# Patient Record
Sex: Female | Born: 1935
Health system: Southern US, Community
[De-identification: ages and names within clinical notes are randomized; demographics above are authoritative.]

## PROBLEM LIST (undated history)

## (undated) DIAGNOSIS — I10 Essential (primary) hypertension: Secondary | ICD-10-CM

## (undated) DIAGNOSIS — F0390 Unspecified dementia without behavioral disturbance: Secondary | ICD-10-CM

## (undated) DIAGNOSIS — D649 Anemia, unspecified: Secondary | ICD-10-CM

## (undated) HISTORY — DX: Essential (primary) hypertension: I10

## (undated) HISTORY — DX: Anemia, unspecified: D64.9

---

## 1975-04-30 HISTORY — PX: TUBAL LIGATION: SHX77

## 2000-12-17 ENCOUNTER — Encounter: Payer: Self-pay | Admitting: Internal Medicine

## 2000-12-17 ENCOUNTER — Ambulatory Visit (HOSPITAL_COMMUNITY): Admission: RE | Admit: 2000-12-17 | Discharge: 2000-12-17 | Payer: Self-pay | Admitting: Internal Medicine

## 2001-12-23 ENCOUNTER — Encounter: Payer: Self-pay | Admitting: Internal Medicine

## 2001-12-23 ENCOUNTER — Ambulatory Visit (HOSPITAL_COMMUNITY): Admission: RE | Admit: 2001-12-23 | Discharge: 2001-12-23 | Payer: Self-pay | Admitting: Internal Medicine

## 2002-01-11 ENCOUNTER — Other Ambulatory Visit: Admission: RE | Admit: 2002-01-11 | Discharge: 2002-01-11 | Payer: Self-pay | Admitting: Internal Medicine

## 2002-12-27 ENCOUNTER — Encounter: Payer: Self-pay | Admitting: Internal Medicine

## 2002-12-27 ENCOUNTER — Ambulatory Visit (HOSPITAL_COMMUNITY): Admission: RE | Admit: 2002-12-27 | Discharge: 2002-12-27 | Payer: Self-pay | Admitting: Internal Medicine

## 2003-10-28 DIAGNOSIS — D649 Anemia, unspecified: Secondary | ICD-10-CM

## 2003-10-28 HISTORY — DX: Anemia, unspecified: D64.9

## 2003-12-06 ENCOUNTER — Ambulatory Visit (HOSPITAL_COMMUNITY): Admission: RE | Admit: 2003-12-06 | Discharge: 2003-12-06 | Payer: Self-pay | Admitting: Ophthalmology

## 2003-12-06 HISTORY — PX: CATARACT EXTRACTION: SUR2

## 2004-01-04 ENCOUNTER — Ambulatory Visit (HOSPITAL_COMMUNITY): Admission: RE | Admit: 2004-01-04 | Discharge: 2004-01-04 | Payer: Self-pay | Admitting: Internal Medicine

## 2004-02-01 ENCOUNTER — Other Ambulatory Visit: Admission: RE | Admit: 2004-02-01 | Discharge: 2004-02-01 | Payer: Self-pay | Admitting: Internal Medicine

## 2004-03-19 ENCOUNTER — Ambulatory Visit: Payer: Self-pay | Admitting: Oncology

## 2004-05-21 ENCOUNTER — Ambulatory Visit: Payer: Self-pay | Admitting: Oncology

## 2004-07-16 ENCOUNTER — Ambulatory Visit: Payer: Self-pay | Admitting: Oncology

## 2004-11-09 ENCOUNTER — Ambulatory Visit: Payer: Self-pay | Admitting: Oncology

## 2005-01-04 ENCOUNTER — Ambulatory Visit (HOSPITAL_COMMUNITY): Admission: RE | Admit: 2005-01-04 | Discharge: 2005-01-04 | Payer: Self-pay | Admitting: Internal Medicine

## 2005-01-04 ENCOUNTER — Ambulatory Visit: Payer: Self-pay | Admitting: Oncology

## 2005-03-01 ENCOUNTER — Ambulatory Visit: Payer: Self-pay | Admitting: Oncology

## 2005-05-03 ENCOUNTER — Ambulatory Visit: Payer: Self-pay | Admitting: Oncology

## 2005-06-10 ENCOUNTER — Ambulatory Visit (HOSPITAL_COMMUNITY): Admission: RE | Admit: 2005-06-10 | Discharge: 2005-06-10 | Payer: Self-pay | Admitting: *Deleted

## 2005-06-28 ENCOUNTER — Ambulatory Visit: Payer: Self-pay | Admitting: Oncology

## 2005-08-23 ENCOUNTER — Ambulatory Visit: Payer: Self-pay | Admitting: Oncology

## 2005-08-26 LAB — CBC WITH DIFFERENTIAL/PLATELET
Basophils Absolute: 0 10*3/uL (ref 0.0–0.1)
EOS%: 1 % (ref 0.0–7.0)
Eosinophils Absolute: 0.1 10*3/uL (ref 0.0–0.5)
HGB: 11.4 g/dL — ABNORMAL LOW (ref 11.6–15.9)
MONO#: 0.7 10*3/uL (ref 0.1–0.9)
NEUT#: 3.3 10*3/uL (ref 1.5–6.5)
RDW: 14.8 % — ABNORMAL HIGH (ref 11.3–14.5)
lymph#: 1.2 10*3/uL (ref 0.9–3.3)

## 2005-09-24 LAB — CBC WITH DIFFERENTIAL/PLATELET
BASO%: 0.4 % (ref 0.0–2.0)
EOS%: 0.7 % (ref 0.0–7.0)
HGB: 12.2 g/dL (ref 11.6–15.9)
MCH: 27.2 pg (ref 26.0–34.0)
MCHC: 32.9 g/dL (ref 32.0–36.0)
RDW: 15.5 % — ABNORMAL HIGH (ref 11.3–14.5)
WBC: 3.7 10*3/uL — ABNORMAL LOW (ref 3.9–10.0)
lymph#: 0.9 10*3/uL (ref 0.9–3.3)

## 2005-10-07 LAB — CBC WITH DIFFERENTIAL/PLATELET
Basophils Absolute: 0 10*3/uL (ref 0.0–0.1)
Eosinophils Absolute: 0.1 10*3/uL (ref 0.0–0.5)
HGB: 12.1 g/dL (ref 11.6–15.9)
MONO#: 0.6 10*3/uL (ref 0.1–0.9)
NEUT#: 2.1 10*3/uL (ref 1.5–6.5)
RDW: 15.8 % — ABNORMAL HIGH (ref 11.3–14.5)
WBC: 4 10*3/uL (ref 3.9–10.0)
lymph#: 1.2 10*3/uL (ref 0.9–3.3)

## 2005-10-07 LAB — COMPREHENSIVE METABOLIC PANEL
ALT: 17 U/L (ref 0–40)
Alkaline Phosphatase: 160 U/L — ABNORMAL HIGH (ref 39–117)
CO2: 23 mEq/L (ref 19–32)
Creatinine, Ser: 1.72 mg/dL — ABNORMAL HIGH (ref 0.40–1.20)
Total Bilirubin: 0.4 mg/dL (ref 0.3–1.2)

## 2005-10-07 LAB — IRON AND TIBC
%SAT: 40 % (ref 20–55)
Iron: 123 ug/dL (ref 42–145)
UIBC: 188 ug/dL

## 2005-10-07 LAB — FERRITIN: Ferritin: 220 ng/mL (ref 10–291)

## 2005-10-22 ENCOUNTER — Ambulatory Visit (HOSPITAL_COMMUNITY): Admission: RE | Admit: 2005-10-22 | Discharge: 2005-10-22 | Payer: Self-pay | Admitting: Ophthalmology

## 2005-10-25 ENCOUNTER — Ambulatory Visit: Payer: Self-pay | Admitting: Oncology

## 2005-11-04 LAB — CBC WITH DIFFERENTIAL/PLATELET
BASO%: 0.4 % (ref 0.0–2.0)
Basophils Absolute: 0 10*3/uL (ref 0.0–0.1)
EOS%: 1.3 % (ref 0.0–7.0)
HGB: 10.6 g/dL — ABNORMAL LOW (ref 11.6–15.9)
MCH: 27.6 pg (ref 26.0–34.0)
RDW: 15.4 % — ABNORMAL HIGH (ref 11.3–14.5)
lymph#: 1.1 10*3/uL (ref 0.9–3.3)

## 2005-12-02 LAB — CBC WITH DIFFERENTIAL/PLATELET
BASO%: 0.5 % (ref 0.0–2.0)
EOS%: 0.6 % (ref 0.0–7.0)
HCT: 35.2 % (ref 34.8–46.6)
LYMPH%: 25.4 % (ref 14.0–48.0)
MCH: 27.9 pg (ref 26.0–34.0)
MCHC: 33.1 g/dL (ref 32.0–36.0)
MCV: 84.3 fL (ref 81.0–101.0)
MONO%: 17.6 % — ABNORMAL HIGH (ref 0.0–13.0)
NEUT%: 55.9 % (ref 39.6–76.8)
Platelets: 192 10*3/uL (ref 145–400)

## 2005-12-26 ENCOUNTER — Ambulatory Visit: Payer: Self-pay | Admitting: Oncology

## 2005-12-31 LAB — CBC WITH DIFFERENTIAL/PLATELET
BASO%: 0.6 % (ref 0.0–2.0)
EOS%: 1.1 % (ref 0.0–7.0)
HCT: 40.5 % (ref 34.8–46.6)
MCH: 27.5 pg (ref 26.0–34.0)
MCHC: 32.9 g/dL (ref 32.0–36.0)
NEUT%: 54.9 % (ref 39.6–76.8)
RDW: 15.2 % — ABNORMAL HIGH (ref 11.3–14.5)
lymph#: 1 10*3/uL (ref 0.9–3.3)

## 2006-01-07 ENCOUNTER — Ambulatory Visit (HOSPITAL_COMMUNITY): Admission: RE | Admit: 2006-01-07 | Discharge: 2006-01-07 | Payer: Self-pay | Admitting: Internal Medicine

## 2006-04-01 ENCOUNTER — Ambulatory Visit: Payer: Self-pay | Admitting: Oncology

## 2006-04-02 LAB — CBC WITH DIFFERENTIAL/PLATELET
Eosinophils Absolute: 0.1 10*3/uL (ref 0.0–0.5)
HCT: 32.6 % — ABNORMAL LOW (ref 34.8–46.6)
LYMPH%: 24.9 % (ref 14.0–48.0)
MCV: 80.7 fL — ABNORMAL LOW (ref 81.0–101.0)
MONO#: 0.6 10*3/uL (ref 0.1–0.9)
NEUT#: 2.8 10*3/uL (ref 1.5–6.5)
NEUT%: 60.1 % (ref 39.6–76.8)
Platelets: 220 10*3/uL (ref 145–400)
WBC: 4.7 10*3/uL (ref 3.9–10.0)

## 2006-05-20 ENCOUNTER — Encounter: Admission: RE | Admit: 2006-05-20 | Discharge: 2006-05-20 | Payer: Self-pay | Admitting: Interventional Cardiology

## 2006-05-22 ENCOUNTER — Other Ambulatory Visit: Admission: RE | Admit: 2006-05-22 | Discharge: 2006-05-22 | Payer: Self-pay | Admitting: Internal Medicine

## 2006-05-30 ENCOUNTER — Ambulatory Visit: Payer: Self-pay | Admitting: Oncology

## 2006-06-04 LAB — CBC WITH DIFFERENTIAL/PLATELET
Basophils Absolute: 0 10*3/uL (ref 0.0–0.1)
Eosinophils Absolute: 0 10*3/uL (ref 0.0–0.5)
HGB: 11 g/dL — ABNORMAL LOW (ref 11.6–15.9)
MCV: 83.2 fL (ref 81.0–101.0)
MONO%: 11.6 % (ref 0.0–13.0)
NEUT#: 4.1 10*3/uL (ref 1.5–6.5)
Platelets: 186 10*3/uL (ref 145–400)
RDW: 13.8 % (ref 11.3–14.5)

## 2006-07-28 ENCOUNTER — Ambulatory Visit: Payer: Self-pay | Admitting: Oncology

## 2006-07-30 LAB — COMPREHENSIVE METABOLIC PANEL
Albumin: 4.1 g/dL (ref 3.5–5.2)
BUN: 22 mg/dL (ref 6–23)
CO2: 21 mEq/L (ref 19–32)
Glucose, Bld: 141 mg/dL — ABNORMAL HIGH (ref 70–99)
Sodium: 139 mEq/L (ref 135–145)
Total Bilirubin: 0.3 mg/dL (ref 0.3–1.2)
Total Protein: 7.7 g/dL (ref 6.0–8.3)

## 2006-07-30 LAB — CBC WITH DIFFERENTIAL/PLATELET
Basophils Absolute: 0 10*3/uL (ref 0.0–0.1)
Eosinophils Absolute: 0.1 10*3/uL (ref 0.0–0.5)
HCT: 36.7 % (ref 34.8–46.6)
HGB: 12.4 g/dL (ref 11.6–15.9)
LYMPH%: 25 % (ref 14.0–48.0)
MONO#: 0.6 10*3/uL (ref 0.1–0.9)
NEUT#: 3.3 10*3/uL (ref 1.5–6.5)
Platelets: 181 10*3/uL (ref 145–400)
RBC: 4.47 10*6/uL (ref 3.70–5.32)
WBC: 5.3 10*3/uL (ref 3.9–10.0)

## 2006-07-30 LAB — IRON AND TIBC
%SAT: 36 % (ref 20–55)
Iron: 95 ug/dL (ref 42–145)
UIBC: 166 ug/dL

## 2006-07-30 LAB — LACTATE DEHYDROGENASE: LDH: 219 U/L (ref 94–250)

## 2006-09-19 ENCOUNTER — Ambulatory Visit: Payer: Self-pay | Admitting: Oncology

## 2006-09-24 LAB — CBC WITH DIFFERENTIAL/PLATELET
Basophils Absolute: 0 10*3/uL (ref 0.0–0.1)
EOS%: 0.9 % (ref 0.0–7.0)
HCT: 32.4 % — ABNORMAL LOW (ref 34.8–46.6)
HGB: 10.9 g/dL — ABNORMAL LOW (ref 11.6–15.9)
MCH: 27.6 pg (ref 26.0–34.0)
MONO#: 0.7 10*3/uL (ref 0.1–0.9)
NEUT#: 3.3 10*3/uL (ref 1.5–6.5)
RDW: 14.3 % (ref 11.3–14.5)
WBC: 5.2 10*3/uL (ref 3.9–10.0)
lymph#: 1.1 10*3/uL (ref 0.9–3.3)

## 2006-11-17 ENCOUNTER — Ambulatory Visit: Payer: Self-pay | Admitting: Oncology

## 2006-11-19 LAB — CBC WITH DIFFERENTIAL/PLATELET
BASO%: 0.5 % (ref 0.0–2.0)
Basophils Absolute: 0 10e3/uL (ref 0.0–0.1)
EOS%: 1.1 % (ref 0.0–7.0)
Eosinophils Absolute: 0 10e3/uL (ref 0.0–0.5)
HCT: 33.3 % — ABNORMAL LOW (ref 34.8–46.6)
HGB: 11.3 g/dL — ABNORMAL LOW (ref 11.6–15.9)
LYMPH%: 17.5 % (ref 14.0–48.0)
MCH: 27.8 pg (ref 26.0–34.0)
MCHC: 33.9 g/dL (ref 32.0–36.0)
MCV: 82.1 fL (ref 81.0–101.0)
MONO#: 0.4 10e3/uL (ref 0.1–0.9)
MONO%: 9.7 % (ref 0.0–13.0)
NEUT#: 3.2 10e3/uL (ref 1.5–6.5)
NEUT%: 71.2 % (ref 39.6–76.8)
Platelets: 184 10e3/uL (ref 145–400)
RBC: 4.05 10e6/uL (ref 3.70–5.32)
RDW: 14.2 % (ref 11.3–14.5)
WBC: 4.4 10e3/uL (ref 3.9–10.0)
lymph#: 0.8 10e3/uL — ABNORMAL LOW (ref 0.9–3.3)

## 2007-01-12 ENCOUNTER — Ambulatory Visit (HOSPITAL_COMMUNITY): Admission: RE | Admit: 2007-01-12 | Discharge: 2007-01-12 | Payer: Self-pay | Admitting: Internal Medicine

## 2007-01-12 ENCOUNTER — Ambulatory Visit: Payer: Self-pay | Admitting: Oncology

## 2007-01-14 LAB — CBC WITH DIFFERENTIAL/PLATELET
Basophils Absolute: 0 10*3/uL (ref 0.0–0.1)
Eosinophils Absolute: 0.1 10*3/uL (ref 0.0–0.5)
HGB: 12.1 g/dL (ref 11.6–15.9)
LYMPH%: 26 % (ref 14.0–48.0)
MCV: 80.8 fL — ABNORMAL LOW (ref 81.0–101.0)
MONO%: 12.9 % (ref 0.0–13.0)
NEUT#: 2.9 10*3/uL (ref 1.5–6.5)
NEUT%: 59.1 % (ref 39.6–76.8)
Platelets: 183 10*3/uL (ref 145–400)

## 2007-01-29 LAB — CBC WITH DIFFERENTIAL/PLATELET
BASO%: 0.2 % (ref 0.0–2.0)
Eosinophils Absolute: 0 10*3/uL (ref 0.0–0.5)
LYMPH%: 23.7 % (ref 14.0–48.0)
MCH: 26.7 pg (ref 26.0–34.0)
MCHC: 33.2 g/dL (ref 32.0–36.0)
MCV: 80.5 fL — ABNORMAL LOW (ref 81.0–101.0)
MONO%: 18.2 % — ABNORMAL HIGH (ref 0.0–13.0)
Platelets: 207 10*3/uL (ref 145–400)
RBC: 4.97 10*6/uL (ref 3.70–5.32)

## 2007-01-30 LAB — COMPREHENSIVE METABOLIC PANEL
Alkaline Phosphatase: 133 U/L — ABNORMAL HIGH (ref 39–117)
Glucose, Bld: 162 mg/dL — ABNORMAL HIGH (ref 70–99)
Sodium: 138 mEq/L (ref 135–145)
Total Bilirubin: 0.4 mg/dL (ref 0.3–1.2)
Total Protein: 7.9 g/dL (ref 6.0–8.3)

## 2007-01-30 LAB — IRON AND TIBC: UIBC: 255 ug/dL

## 2007-03-13 ENCOUNTER — Ambulatory Visit: Payer: Self-pay | Admitting: Oncology

## 2007-03-18 LAB — CBC WITH DIFFERENTIAL/PLATELET
Basophils Absolute: 0 10*3/uL (ref 0.0–0.1)
Eosinophils Absolute: 0.1 10*3/uL (ref 0.0–0.5)
HGB: 11.4 g/dL — ABNORMAL LOW (ref 11.6–15.9)
LYMPH%: 18.7 % (ref 14.0–48.0)
MONO#: 0.8 10*3/uL (ref 0.1–0.9)
NEUT#: 5.1 10*3/uL (ref 1.5–6.5)
Platelets: 174 10*3/uL (ref 145–400)
RBC: 4.24 10*6/uL (ref 3.70–5.32)
RDW: 14.9 % — ABNORMAL HIGH (ref 11.3–14.5)
WBC: 7.4 10*3/uL (ref 3.9–10.0)

## 2007-05-11 ENCOUNTER — Ambulatory Visit: Payer: Self-pay | Admitting: Oncology

## 2007-05-13 LAB — CBC WITH DIFFERENTIAL/PLATELET
Eosinophils Absolute: 0.1 10*3/uL (ref 0.0–0.5)
HCT: 36.4 % (ref 34.8–46.6)
LYMPH%: 18.5 % (ref 14.0–48.0)
MONO#: 0.6 10*3/uL (ref 0.1–0.9)
NEUT#: 4 10*3/uL (ref 1.5–6.5)
NEUT%: 69.4 % (ref 39.6–76.8)
Platelets: 199 10*3/uL (ref 145–400)
WBC: 5.7 10*3/uL (ref 3.9–10.0)

## 2007-06-14 ENCOUNTER — Encounter: Admission: RE | Admit: 2007-06-14 | Discharge: 2007-06-14 | Payer: Self-pay | Admitting: Internal Medicine

## 2007-07-10 ENCOUNTER — Ambulatory Visit: Payer: Self-pay | Admitting: Oncology

## 2007-09-04 ENCOUNTER — Ambulatory Visit: Payer: Self-pay | Admitting: Oncology

## 2007-09-08 LAB — CBC WITH DIFFERENTIAL/PLATELET
Basophils Absolute: 0 10*3/uL (ref 0.0–0.1)
Eosinophils Absolute: 0.1 10*3/uL (ref 0.0–0.5)
HGB: 11 g/dL — ABNORMAL LOW (ref 11.6–15.9)
MCV: 82.1 fL (ref 81.0–101.0)
MONO#: 0.6 10*3/uL (ref 0.1–0.9)
NEUT#: 3 10*3/uL (ref 1.5–6.5)
RBC: 3.98 10*6/uL (ref 3.70–5.32)
RDW: 13.5 % (ref 11.3–14.5)
WBC: 4.9 10*3/uL (ref 3.9–10.0)
lymph#: 1.1 10*3/uL (ref 0.9–3.3)

## 2007-10-29 ENCOUNTER — Ambulatory Visit: Payer: Self-pay | Admitting: Oncology

## 2007-11-03 LAB — CBC WITH DIFFERENTIAL/PLATELET
BASO%: 0.7 % (ref 0.0–2.0)
Basophils Absolute: 0 10*3/uL (ref 0.0–0.1)
EOS%: 2.5 % (ref 0.0–7.0)
HGB: 12.2 g/dL (ref 11.6–15.9)
MCH: 26.9 pg (ref 26.0–34.0)
RBC: 4.54 10*6/uL (ref 3.70–5.32)
RDW: 13.8 % (ref 11.3–14.5)
lymph#: 1.2 10*3/uL (ref 0.9–3.3)

## 2007-12-31 ENCOUNTER — Ambulatory Visit: Payer: Self-pay | Admitting: Oncology

## 2008-01-06 LAB — CBC WITH DIFFERENTIAL/PLATELET
Eosinophils Absolute: 0.1 10*3/uL (ref 0.0–0.5)
HCT: 31 % — ABNORMAL LOW (ref 34.8–46.6)
HGB: 10.4 g/dL — ABNORMAL LOW (ref 11.6–15.9)
LYMPH%: 22.2 % (ref 14.0–48.0)
MONO#: 0.6 10*3/uL (ref 0.1–0.9)
NEUT#: 3.2 10*3/uL (ref 1.5–6.5)
Platelets: 190 10*3/uL (ref 145–400)
RBC: 3.81 10*6/uL (ref 3.70–5.32)
WBC: 5 10*3/uL (ref 3.9–10.0)

## 2008-01-08 LAB — COMPREHENSIVE METABOLIC PANEL
Albumin: 4.1 g/dL (ref 3.5–5.2)
CO2: 21 mEq/L (ref 19–32)
Glucose, Bld: 130 mg/dL — ABNORMAL HIGH (ref 70–99)
Sodium: 138 mEq/L (ref 135–145)
Total Bilirubin: 0.4 mg/dL (ref 0.3–1.2)
Total Protein: 8 g/dL (ref 6.0–8.3)

## 2008-01-08 LAB — TRANSFERRIN RECEPTOR, SOLUABLE: Transferrin Receptor, Soluble: 13 nmol/L

## 2008-01-08 LAB — FERRITIN: Ferritin: 320 ng/mL — ABNORMAL HIGH (ref 10–291)

## 2008-01-08 LAB — IRON AND TIBC
%SAT: 33 % (ref 20–55)
UIBC: 204 ug/dL

## 2008-01-14 ENCOUNTER — Ambulatory Visit (HOSPITAL_COMMUNITY): Admission: RE | Admit: 2008-01-14 | Discharge: 2008-01-14 | Payer: Self-pay | Admitting: Internal Medicine

## 2008-02-25 ENCOUNTER — Ambulatory Visit (HOSPITAL_COMMUNITY): Admission: RE | Admit: 2008-02-25 | Discharge: 2008-02-26 | Payer: Self-pay | Admitting: Neurosurgery

## 2008-02-29 ENCOUNTER — Ambulatory Visit: Payer: Self-pay | Admitting: Oncology

## 2008-03-16 LAB — CBC WITH DIFFERENTIAL/PLATELET
Eosinophils Absolute: 0.1 10*3/uL (ref 0.0–0.5)
MONO#: 0.5 10*3/uL (ref 0.1–0.9)
MONO%: 7.4 % (ref 0.0–13.0)
NEUT#: 5.5 10*3/uL (ref 1.5–6.5)
RBC: 4.13 10*6/uL (ref 3.70–5.32)
RDW: 13.1 % (ref 11.3–14.5)
WBC: 6.8 10*3/uL (ref 3.9–10.0)
lymph#: 0.7 10*3/uL — ABNORMAL LOW (ref 0.9–3.3)

## 2008-04-25 ENCOUNTER — Ambulatory Visit: Payer: Self-pay | Admitting: Oncology

## 2008-04-27 LAB — CBC WITH DIFFERENTIAL/PLATELET
BASO%: 0.2 % (ref 0.0–2.0)
Basophils Absolute: 0 10*3/uL (ref 0.0–0.1)
EOS%: 1.8 % (ref 0.0–7.0)
Eosinophils Absolute: 0.1 10*3/uL (ref 0.0–0.5)
HCT: 35.3 % (ref 34.8–46.6)
HGB: 11.7 g/dL (ref 11.6–15.9)
LYMPH%: 22.3 % (ref 14.0–48.0)
MCH: 27.5 pg (ref 26.0–34.0)
MCHC: 33.2 g/dL (ref 32.0–36.0)
MCV: 82.7 fL (ref 81.0–101.0)
MONO#: 0.7 10*3/uL (ref 0.1–0.9)
MONO%: 13.8 % — ABNORMAL HIGH (ref 0.0–13.0)
NEUT#: 3 10*3/uL (ref 1.5–6.5)
NEUT%: 61.9 % (ref 39.6–76.8)
Platelets: 184 10*3/uL (ref 145–400)
RBC: 4.27 10*6/uL (ref 3.70–5.32)
RDW: 14.1 % (ref 11.3–14.5)
WBC: 4.8 10*3/uL (ref 3.9–10.0)
lymph#: 1.1 10*3/uL (ref 0.9–3.3)

## 2008-06-21 ENCOUNTER — Ambulatory Visit: Payer: Self-pay | Admitting: Oncology

## 2008-06-23 LAB — CBC WITH DIFFERENTIAL/PLATELET
BASO%: 0.3 % (ref 0.0–2.0)
Basophils Absolute: 0 10*3/uL (ref 0.0–0.1)
EOS%: 2 % (ref 0.0–7.0)
HGB: 12.3 g/dL (ref 11.6–15.9)
MCH: 27 pg (ref 25.1–34.0)
MCHC: 32.9 g/dL (ref 31.5–36.0)
MONO#: 0.6 10*3/uL (ref 0.1–0.9)
RDW: 15.1 % — ABNORMAL HIGH (ref 11.2–14.5)
WBC: 5.2 10*3/uL (ref 3.9–10.3)
lymph#: 1.2 10*3/uL (ref 0.9–3.3)

## 2008-06-26 LAB — COMPREHENSIVE METABOLIC PANEL
ALT: 24 U/L (ref 0–35)
AST: 21 U/L (ref 0–37)
Albumin: 4.5 g/dL (ref 3.5–5.2)
CO2: 20 mEq/L (ref 19–32)
Calcium: 9.3 mg/dL (ref 8.4–10.5)
Chloride: 105 mEq/L (ref 96–112)
Potassium: 3.9 mEq/L (ref 3.5–5.3)

## 2008-06-26 LAB — TRANSFERRIN RECEPTOR, SOLUABLE: Transferrin Receptor, Soluble: 17.4 nmol/L

## 2008-08-16 ENCOUNTER — Ambulatory Visit: Payer: Self-pay | Admitting: Oncology

## 2008-08-18 LAB — CBC WITH DIFFERENTIAL/PLATELET
BASO%: 0.4 % (ref 0.0–2.0)
EOS%: 1.6 % (ref 0.0–7.0)
HCT: 31.9 % — ABNORMAL LOW (ref 34.8–46.6)
MCH: 26.2 pg (ref 25.1–34.0)
MCHC: 33.5 g/dL (ref 31.5–36.0)
NEUT%: 58.4 % (ref 38.4–76.8)
RBC: 4.08 10*6/uL (ref 3.70–5.45)
RDW: 15.4 % — ABNORMAL HIGH (ref 11.2–14.5)
WBC: 5 10*3/uL (ref 3.9–10.3)
lymph#: 1.3 10*3/uL (ref 0.9–3.3)

## 2008-10-11 ENCOUNTER — Ambulatory Visit: Payer: Self-pay | Admitting: Oncology

## 2008-10-13 LAB — CBC WITH DIFFERENTIAL/PLATELET
BASO%: 0.6 % (ref 0.0–2.0)
Eosinophils Absolute: 0.1 10*3/uL (ref 0.0–0.5)
LYMPH%: 21.8 % (ref 14.0–49.7)
MCHC: 34.3 g/dL (ref 31.5–36.0)
MONO#: 0.6 10*3/uL (ref 0.1–0.9)
NEUT#: 3.2 10*3/uL (ref 1.5–6.5)
RBC: 4.12 10*6/uL (ref 3.70–5.45)
RDW: 14 % (ref 11.2–14.5)
WBC: 5 10*3/uL (ref 3.9–10.3)

## 2008-12-06 ENCOUNTER — Ambulatory Visit: Payer: Self-pay | Admitting: Oncology

## 2008-12-08 LAB — CBC WITH DIFFERENTIAL/PLATELET
Basophils Absolute: 0 10*3/uL (ref 0.0–0.1)
HCT: 38.4 % (ref 34.8–46.6)
HGB: 12.5 g/dL (ref 11.6–15.9)
MCH: 27 pg (ref 25.1–34.0)
MONO#: 0.6 10*3/uL (ref 0.1–0.9)
NEUT%: 65.6 % (ref 38.4–76.8)
WBC: 4.8 10*3/uL (ref 3.9–10.3)
lymph#: 1 10*3/uL (ref 0.9–3.3)

## 2008-12-10 LAB — TRANSFERRIN RECEPTOR, SOLUABLE: Transferrin Receptor, Soluble: 19.7 nmol/L

## 2008-12-10 LAB — COMPREHENSIVE METABOLIC PANEL
BUN: 28 mg/dL — ABNORMAL HIGH (ref 6–23)
CO2: 23 mEq/L (ref 19–32)
Calcium: 9 mg/dL (ref 8.4–10.5)
Chloride: 108 mEq/L (ref 96–112)
Creatinine, Ser: 1.57 mg/dL — ABNORMAL HIGH (ref 0.40–1.20)
Glucose, Bld: 142 mg/dL — ABNORMAL HIGH (ref 70–99)

## 2008-12-10 LAB — IRON AND TIBC
%SAT: 33 % (ref 20–55)
TIBC: 285 ug/dL (ref 250–470)

## 2008-12-10 LAB — FERRITIN: Ferritin: 151 ng/mL (ref 10–291)

## 2009-01-16 ENCOUNTER — Ambulatory Visit (HOSPITAL_COMMUNITY): Admission: RE | Admit: 2009-01-16 | Discharge: 2009-01-16 | Payer: Self-pay | Admitting: Internal Medicine

## 2009-01-31 ENCOUNTER — Ambulatory Visit: Payer: Self-pay | Admitting: Oncology

## 2009-02-02 LAB — CBC WITH DIFFERENTIAL/PLATELET
BASO%: 0.7 % (ref 0.0–2.0)
HCT: 31.6 % — ABNORMAL LOW (ref 34.8–46.6)
MCHC: 33.3 g/dL (ref 31.5–36.0)
MONO#: 0.7 10*3/uL (ref 0.1–0.9)
NEUT#: 3.3 10*3/uL (ref 1.5–6.5)
NEUT%: 63.4 % (ref 38.4–76.8)
WBC: 5.2 10*3/uL (ref 3.9–10.3)
lymph#: 1.1 10*3/uL (ref 0.9–3.3)

## 2009-03-28 ENCOUNTER — Ambulatory Visit: Payer: Self-pay | Admitting: Oncology

## 2009-03-30 LAB — CBC WITH DIFFERENTIAL/PLATELET
BASO%: 0.6 % (ref 0.0–2.0)
LYMPH%: 24.3 % (ref 14.0–49.7)
MCHC: 33.1 g/dL (ref 31.5–36.0)
MONO#: 0.7 10*3/uL (ref 0.1–0.9)
Platelets: 169 10*3/uL (ref 145–400)
RBC: 4.1 10*6/uL (ref 3.70–5.45)
RDW: 14.5 % (ref 11.2–14.5)
WBC: 5.3 10*3/uL (ref 3.9–10.3)
lymph#: 1.3 10*3/uL (ref 0.9–3.3)

## 2009-05-30 ENCOUNTER — Ambulatory Visit: Payer: Self-pay | Admitting: Oncology

## 2009-06-01 LAB — CBC WITH DIFFERENTIAL/PLATELET
BASO%: 0.8 % (ref 0.0–2.0)
LYMPH%: 30.7 % (ref 14.0–49.7)
MCHC: 31.9 g/dL (ref 31.5–36.0)
MONO#: 0.5 10*3/uL (ref 0.1–0.9)
Platelets: 190 10*3/uL (ref 145–400)
RBC: 5.01 10*6/uL (ref 3.70–5.45)
WBC: 4.8 10*3/uL (ref 3.9–10.3)
lymph#: 1.5 10*3/uL (ref 0.9–3.3)
nRBC: 0 % (ref 0–0)

## 2009-06-05 LAB — IRON AND TIBC
%SAT: 28 % (ref 20–55)
TIBC: 285 ug/dL (ref 250–470)

## 2009-06-05 LAB — COMPREHENSIVE METABOLIC PANEL
ALT: 22 U/L (ref 0–35)
AST: 21 U/L (ref 0–37)
Calcium: 9.6 mg/dL (ref 8.4–10.5)
Chloride: 106 mEq/L (ref 96–112)
Creatinine, Ser: 1.51 mg/dL — ABNORMAL HIGH (ref 0.40–1.20)

## 2009-07-27 ENCOUNTER — Ambulatory Visit: Payer: Self-pay | Admitting: Oncology

## 2009-07-31 LAB — CBC WITH DIFFERENTIAL/PLATELET
BASO%: 0.7 % (ref 0.0–2.0)
EOS%: 1.3 % (ref 0.0–7.0)
LYMPH%: 21.9 % (ref 14.0–49.7)
MCH: 27.3 pg (ref 25.1–34.0)
MCHC: 33.3 g/dL (ref 31.5–36.0)
MCV: 82 fL (ref 79.5–101.0)
MONO%: 14.7 % — ABNORMAL HIGH (ref 0.0–14.0)
Platelets: 183 10*3/uL (ref 145–400)
RBC: 4.03 10*6/uL (ref 3.70–5.45)

## 2009-09-28 ENCOUNTER — Ambulatory Visit: Payer: Self-pay | Admitting: Oncology

## 2009-09-29 LAB — CBC WITH DIFFERENTIAL/PLATELET
BASO%: 0.6 % (ref 0.0–2.0)
Eosinophils Absolute: 0.1 10*3/uL (ref 0.0–0.5)
HCT: 33.8 % — ABNORMAL LOW (ref 34.8–46.6)
MCHC: 33.6 g/dL (ref 31.5–36.0)
MONO#: 0.5 10*3/uL (ref 0.1–0.9)
NEUT#: 3 10*3/uL (ref 1.5–6.5)
RBC: 4.04 10*6/uL (ref 3.70–5.45)
WBC: 4.7 10*3/uL (ref 3.9–10.3)
lymph#: 1.1 10*3/uL (ref 0.9–3.3)

## 2009-10-01 LAB — IRON AND TIBC
%SAT: 31 % (ref 20–55)
Iron: 81 ug/dL (ref 42–145)
TIBC: 261 ug/dL (ref 250–470)

## 2009-11-14 ENCOUNTER — Ambulatory Visit: Payer: Self-pay | Admitting: Oncology

## 2009-11-16 LAB — CBC WITH DIFFERENTIAL/PLATELET
BASO%: 0.5 % (ref 0.0–2.0)
Basophils Absolute: 0 10*3/uL (ref 0.0–0.1)
EOS%: 2.5 % (ref 0.0–7.0)
Eosinophils Absolute: 0.1 10*3/uL (ref 0.0–0.5)
HCT: 39.7 % (ref 34.8–46.6)
HGB: 12.9 g/dL (ref 11.6–15.9)
LYMPH%: 37 % (ref 14.0–49.7)
MCH: 26.4 pg (ref 25.1–34.0)
MCHC: 32.5 g/dL (ref 31.5–36.0)
MCV: 81.4 fL (ref 79.5–101.0)
MONO#: 0.5 10*3/uL (ref 0.1–0.9)
MONO%: 11.8 % (ref 0.0–14.0)
NEUT#: 2 10*3/uL (ref 1.5–6.5)
NEUT%: 48.2 % (ref 38.4–76.8)
Platelets: 169 10*3/uL (ref 145–400)
RBC: 4.88 10*6/uL (ref 3.70–5.45)
RDW: 14 % (ref 11.2–14.5)
WBC: 4.1 10*3/uL (ref 3.9–10.3)
lymph#: 1.5 10*3/uL (ref 0.9–3.3)
nRBC: 0 % (ref 0–0)

## 2009-11-17 LAB — COMPREHENSIVE METABOLIC PANEL
ALT: 23 U/L (ref 0–35)
AST: 26 U/L (ref 0–37)
Albumin: 4.1 g/dL (ref 3.5–5.2)
Alkaline Phosphatase: 99 U/L (ref 39–117)
BUN: 27 mg/dL — ABNORMAL HIGH (ref 6–23)
CO2: 21 mEq/L (ref 19–32)
Calcium: 9.1 mg/dL (ref 8.4–10.5)
Chloride: 105 mEq/L (ref 96–112)
Creatinine, Ser: 1.73 mg/dL — ABNORMAL HIGH (ref 0.40–1.20)
Glucose, Bld: 182 mg/dL — ABNORMAL HIGH (ref 70–99)
Potassium: 4.1 mEq/L (ref 3.5–5.3)
Sodium: 138 mEq/L (ref 135–145)
Total Bilirubin: 0.3 mg/dL (ref 0.3–1.2)
Total Protein: 7.6 g/dL (ref 6.0–8.3)

## 2009-11-17 LAB — LACTATE DEHYDROGENASE: LDH: 183 U/L (ref 94–250)

## 2009-11-17 LAB — IRON AND TIBC
TIBC: 298 ug/dL (ref 250–470)
UIBC: 229 ug/dL

## 2009-11-17 LAB — TRANSFERRIN RECEPTOR, SOLUABLE: Transferrin Receptor, Soluble: 18.3 nmol/L

## 2009-11-17 LAB — FERRITIN: Ferritin: 135 ng/mL (ref 10–291)

## 2010-01-24 ENCOUNTER — Ambulatory Visit: Payer: Self-pay | Admitting: Oncology

## 2010-01-26 LAB — CBC WITH DIFFERENTIAL/PLATELET
BASO%: 0.5 % (ref 0.0–2.0)
Basophils Absolute: 0 10*3/uL (ref 0.0–0.1)
EOS%: 1.9 % (ref 0.0–7.0)
Eosinophils Absolute: 0.1 10*3/uL (ref 0.0–0.5)
HCT: 32.1 % — ABNORMAL LOW (ref 34.8–46.6)
HGB: 10.8 g/dL — ABNORMAL LOW (ref 11.6–15.9)
LYMPH%: 23.2 % (ref 14.0–49.7)
MCH: 27.4 pg (ref 25.1–34.0)
MCHC: 33.5 g/dL (ref 31.5–36.0)
MCV: 81.7 fL (ref 79.5–101.0)
MONO#: 0.6 10*3/uL (ref 0.1–0.9)
MONO%: 12.5 % (ref 0.0–14.0)
NEUT#: 3.2 10*3/uL (ref 1.5–6.5)
NEUT%: 61.9 % (ref 38.4–76.8)
Platelets: 183 10*3/uL (ref 145–400)
RBC: 3.93 10*6/uL (ref 3.70–5.45)
RDW: 14.6 % — ABNORMAL HIGH (ref 11.2–14.5)
WBC: 5.1 10*3/uL (ref 3.9–10.3)
lymph#: 1.2 10*3/uL (ref 0.9–3.3)

## 2010-02-05 ENCOUNTER — Ambulatory Visit (HOSPITAL_COMMUNITY): Admission: RE | Admit: 2010-02-05 | Discharge: 2010-02-05 | Payer: Self-pay | Admitting: Internal Medicine

## 2010-03-21 ENCOUNTER — Ambulatory Visit: Payer: Self-pay | Admitting: Oncology

## 2010-03-26 LAB — CBC WITH DIFFERENTIAL/PLATELET
Basophils Absolute: 0 10*3/uL (ref 0.0–0.1)
EOS%: 2.4 % (ref 0.0–7.0)
HCT: 34.6 % — ABNORMAL LOW (ref 34.8–46.6)
HGB: 11.6 g/dL (ref 11.6–15.9)
LYMPH%: 26.4 % (ref 14.0–49.7)
MCH: 28 pg (ref 25.1–34.0)
MCV: 83.4 fL (ref 79.5–101.0)
MONO%: 13.7 % (ref 0.0–14.0)
NEUT%: 56.9 % (ref 38.4–76.8)

## 2010-05-15 ENCOUNTER — Emergency Department (HOSPITAL_COMMUNITY)
Admission: EM | Admit: 2010-05-15 | Discharge: 2010-05-15 | Payer: Self-pay | Source: Home / Self Care | Admitting: Emergency Medicine

## 2010-05-16 LAB — COMPREHENSIVE METABOLIC PANEL
ALT: 22 U/L (ref 0–35)
AST: 23 U/L (ref 0–37)
Albumin: 3.9 g/dL (ref 3.5–5.2)
Alkaline Phosphatase: 67 U/L (ref 39–117)
BUN: 24 mg/dL — ABNORMAL HIGH (ref 6–23)
CO2: 25 mEq/L (ref 19–32)
Calcium: 9.6 mg/dL (ref 8.4–10.5)
Chloride: 102 mEq/L (ref 96–112)
Creatinine, Ser: 1.8 mg/dL — ABNORMAL HIGH (ref 0.4–1.2)
GFR calc Af Amer: 33 mL/min — ABNORMAL LOW (ref >60)
GFR calc non Af Amer: 28 mL/min — ABNORMAL LOW (ref >60)
Glucose, Bld: 198 mg/dL — ABNORMAL HIGH (ref 70–99)
Potassium: 4.2 mEq/L (ref 3.5–5.1)
Sodium: 137 mEq/L (ref 135–145)
Total Bilirubin: 0.6 mg/dL (ref 0.3–1.2)
Total Protein: 7.7 g/dL (ref 6.0–8.3)

## 2010-05-16 LAB — URINALYSIS, ROUTINE W REFLEX MICROSCOPIC
Bilirubin Urine: NEGATIVE
Hgb urine dipstick: NEGATIVE
Nitrite: NEGATIVE
Protein, ur: NEGATIVE mg/dL
Specific Gravity, Urine: 1.02 (ref 1.005–1.030)
Urine Glucose, Fasting: NEGATIVE mg/dL
Urobilinogen, UA: 0.2 mg/dL (ref 0.0–1.0)
pH: 5.5 (ref 5.0–8.0)

## 2010-05-16 LAB — DIFFERENTIAL
Basophils Absolute: 0 10*3/uL (ref 0.0–0.1)
Basophils Relative: 0 % (ref 0–1)
Eosinophils Absolute: 0 10*3/uL (ref 0.0–0.7)
Eosinophils Relative: 0 % (ref 0–5)
Lymphocytes Relative: 10 % — ABNORMAL LOW (ref 12–46)
Lymphs Abs: 1.1 10*3/uL (ref 0.7–4.0)
Monocytes Absolute: 1.2 10*3/uL — ABNORMAL HIGH (ref 0.1–1.0)
Monocytes Relative: 11 % (ref 3–12)
Neutro Abs: 8.8 10*3/uL — ABNORMAL HIGH (ref 1.7–7.7)
Neutrophils Relative %: 79 % — ABNORMAL HIGH (ref 43–77)

## 2010-05-16 LAB — CBC
HCT: 32 % — ABNORMAL LOW (ref 36.0–46.0)
Hemoglobin: 11 g/dL — ABNORMAL LOW (ref 12.0–15.0)
MCH: 27.2 pg (ref 26.0–34.0)
MCHC: 34.4 g/dL (ref 30.0–36.0)
MCV: 79 fL (ref 78.0–100.0)
Platelets: 168 10*3/uL (ref 150–400)
RBC: 4.05 MIL/uL (ref 3.87–5.11)
RDW: 13.6 % (ref 11.5–15.5)
WBC: 11.1 10*3/uL — ABNORMAL HIGH (ref 4.0–10.5)

## 2010-05-16 LAB — LIPASE, BLOOD: Lipase: 19 U/L (ref 11–59)

## 2010-05-17 ENCOUNTER — Ambulatory Visit: Payer: Self-pay | Admitting: Oncology

## 2010-05-21 LAB — COMPREHENSIVE METABOLIC PANEL
ALT: 31 U/L (ref 0–35)
AST: 38 U/L — ABNORMAL HIGH (ref 0–37)
Albumin: 3.7 g/dL (ref 3.5–5.2)
Alkaline Phosphatase: 69 U/L (ref 39–117)
BUN: 24 mg/dL — ABNORMAL HIGH (ref 6–23)
CO2: 27 mEq/L (ref 19–32)
Calcium: 9.1 mg/dL (ref 8.4–10.5)
Chloride: 100 mEq/L (ref 96–112)
Creatinine, Ser: 1.97 mg/dL — ABNORMAL HIGH (ref 0.40–1.20)
Glucose, Bld: 217 mg/dL — ABNORMAL HIGH (ref 70–99)
Potassium: 3.4 mEq/L — ABNORMAL LOW (ref 3.5–5.3)
Sodium: 137 mEq/L (ref 135–145)
Total Bilirubin: 0.5 mg/dL (ref 0.3–1.2)
Total Protein: 8.2 g/dL (ref 6.0–8.3)

## 2010-05-21 LAB — CBC WITH DIFFERENTIAL/PLATELET
BASO%: 0.3 % (ref 0.0–2.0)
Basophils Absolute: 0 10*3/uL (ref 0.0–0.1)
EOS%: 1.4 % (ref 0.0–7.0)
Eosinophils Absolute: 0.1 10*3/uL (ref 0.0–0.5)
HCT: 35.2 % (ref 34.8–46.6)
HGB: 11.7 g/dL (ref 11.6–15.9)
LYMPH%: 20.2 % (ref 14.0–49.7)
MCH: 26.6 pg (ref 25.1–34.0)
MCHC: 33.2 g/dL (ref 31.5–36.0)
MCV: 80 fL (ref 79.5–101.0)
MONO#: 0.7 10*3/uL (ref 0.1–0.9)
MONO%: 10.6 % (ref 0.0–14.0)
NEUT#: 4.2 10*3/uL (ref 1.5–6.5)
NEUT%: 67.5 % (ref 38.4–76.8)
Platelets: 222 10*3/uL (ref 145–400)
RBC: 4.4 10*6/uL (ref 3.70–5.45)
RDW: 13.6 % (ref 11.2–14.5)
WBC: 6.2 10*3/uL (ref 3.9–10.3)
lymph#: 1.3 10*3/uL (ref 0.9–3.3)

## 2010-05-21 LAB — LACTATE DEHYDROGENASE: LDH: 220 U/L (ref 94–250)

## 2010-05-22 LAB — IRON AND TIBC
%SAT: 24 % (ref 20–55)
Iron: 64 ug/dL (ref 42–145)
TIBC: 272 ug/dL (ref 250–470)
UIBC: 208 ug/dL

## 2010-05-22 LAB — TRANSFERRIN RECEPTOR, SOLUABLE: Transferrin Receptor, Soluble: 9.8 nmol/L

## 2010-05-22 LAB — FERRITIN: Ferritin: 264 ng/mL (ref 10–291)

## 2010-06-14 ENCOUNTER — Other Ambulatory Visit: Payer: Self-pay | Admitting: Orthopaedic Surgery

## 2010-06-14 DIAGNOSIS — M545 Low back pain: Secondary | ICD-10-CM

## 2010-06-16 ENCOUNTER — Ambulatory Visit
Admission: RE | Admit: 2010-06-16 | Discharge: 2010-06-16 | Disposition: A | Discharge status: Home / Self Care | Payer: Medicare Other | Source: Ambulatory Visit | Attending: Orthopaedic Surgery | Admitting: Orthopaedic Surgery

## 2010-06-16 DIAGNOSIS — M545 Low back pain: Secondary | ICD-10-CM

## 2010-06-28 ENCOUNTER — Other Ambulatory Visit (HOSPITAL_COMMUNITY): Payer: Self-pay | Admitting: Oncology

## 2010-06-28 ENCOUNTER — Encounter (HOSPITAL_BASED_OUTPATIENT_CLINIC_OR_DEPARTMENT_OTHER): Payer: Medicare Other | Admitting: Oncology

## 2010-06-28 DIAGNOSIS — D649 Anemia, unspecified: Secondary | ICD-10-CM

## 2010-06-28 DIAGNOSIS — N289 Disorder of kidney and ureter, unspecified: Secondary | ICD-10-CM

## 2010-06-28 LAB — CBC WITH DIFFERENTIAL/PLATELET
BASO%: 0.8 % (ref 0.0–2.0)
Basophils Absolute: 0.1 10*3/uL (ref 0.0–0.1)
EOS%: 1.7 % (ref 0.0–7.0)
MCH: 27.2 pg (ref 25.1–34.0)
MCV: 82.6 fL (ref 79.5–101.0)
MONO%: 12 % (ref 0.0–14.0)
RBC: 3.78 10*6/uL (ref 3.70–5.45)
RDW: 14.5 % (ref 11.2–14.5)

## 2010-09-03 ENCOUNTER — Encounter (HOSPITAL_BASED_OUTPATIENT_CLINIC_OR_DEPARTMENT_OTHER): Payer: Medicare Other | Admitting: Oncology

## 2010-09-03 ENCOUNTER — Other Ambulatory Visit (HOSPITAL_COMMUNITY): Payer: Self-pay | Admitting: Oncology

## 2010-09-03 DIAGNOSIS — N289 Disorder of kidney and ureter, unspecified: Secondary | ICD-10-CM

## 2010-09-03 DIAGNOSIS — D649 Anemia, unspecified: Secondary | ICD-10-CM

## 2010-09-03 LAB — CBC WITH DIFFERENTIAL/PLATELET
Basophils Absolute: 0 10*3/uL (ref 0.0–0.1)
EOS%: 1.9 % (ref 0.0–7.0)
HCT: 33.6 % — ABNORMAL LOW (ref 34.8–46.6)
HGB: 11.2 g/dL — ABNORMAL LOW (ref 11.6–15.9)
LYMPH%: 28.6 % (ref 14.0–49.7)
MCH: 28.3 pg (ref 25.1–34.0)
MCHC: 33.3 g/dL (ref 31.5–36.0)
MONO#: 0.5 10*3/uL (ref 0.1–0.9)
NEUT%: 55.8 % (ref 38.4–76.8)
Platelets: 143 10*3/uL — ABNORMAL LOW (ref 145–400)
lymph#: 1.1 10*3/uL (ref 0.9–3.3)

## 2010-09-11 NOTE — Op Note (Signed)
Lindsey Mcguire, Lindsey Mcguire             ACCOUNT NO.:  [id number]   MEDICAL RECORD NO.:  [id number]          PATIENT TYPE:  INP   LOCATION:  [id number]                         FACILITY:  [facility]   PHYSICIAN:  Otilio Connors, M.D.  DATE OF BIRTH:  1934/10/10   DATE OF PROCEDURE:  02/25/2008  DATE OF DISCHARGE:                               OPERATIVE REPORT   PREOPERATIVE DIAGNOSES:  Atrophy, spondylosis, stenosis C5-C6 and C6-C7  with myelopathy.   POSTOPERATIVE DIAGNOSES:  Atrophy spondylosis, stenosis C5-C6 and C6-C7  with myelopathy.   PROCEDURE:  Anterior cervical decompression, diskectomy, fusion of C5-C6  and C6-C7 with allograft bone and Trestle anterior cervical plate.   SURGEON:  Otilio Connors, MD   ASSISTANT:  __________   ANESTHESIA:  General endotracheal tube anesthesia.   ESTIMATED BLOOD LOSS:  Minimal.   BLOOD GIVEN:  None.   DRAINS:  None.   COMPLICATIONS:  None.   REASON FOR PROCEDURE:  The patient is a 76 year old woman who has had  bowel and bladder incontinence.  Evaluated with MRI of cervical spine  after she was found to be mildly myelopathic on exam that showed  stenosis at C5-6 and C6-7 from disk bulging and hypertrophic changes.  There is some cord compression, but no change in signal of the cord.  The patient was brought for decompression and fusion of these levels.   PROCEDURE IN DETAIL:  The patient was brought into the operating room  and general anesthesia was induced.  The patient was placed in 10-pound  halter traction, prepped and draped in sterile fashion.  The incision  was injected with 10 mL of 1% lidocaine with epinephrine.  Incision was  then made from the midline to the anterior border of the  sternocleidomastoid muscle on the left side.  The neck incision was  taken down to the platysma and hemostasis was obtained with Bovie  cauterization.  Platysma was incised with a Bovie and blunt dissection  taken through the anterior cervical fascia to  the anterior cervical  spine.  Needle was placed in interspace and it was hard to see where the  needle was due to body habitus and another needle was placed higher.  Another x-ray was obtained showing the top needle was at C4-C5 and we  could make out the second needle that was the original needle at C5-C6.  Needles were removed and the C5-C6 disk space was incised with a 15-  blade and partial diskectomy performed with pituitary rongeur.  Longus  colli muscles were reflected bilaterally using the Bovie and a self-  retaining retractor was placed, so we can see the C5-C6 and C6-C7  interspaces, both; some osteophytes removed with osteophyte tool and  both interspace diskectomy was continued with pituitary rongeurs and  curettes.  Distraction pin was placed and C5 and C7 interspaces  distracted.  Diskectomy was then done at C5-C6 with curettes, 1- and 2-  mm Kerrison punches to remove posterior disk and ligament, there  were  some ligaments that were stuck to the dura and some of this was left.  We did get a  good decompression of the central canal and bilateral  foraminotomies were performed.  A high-speed drill was used to remove  cartilaginous endplate and measured the height of disk space to be 6 mm.  We used 6-mm broach in the implants slightly more and a good hemostasis  with Gelfoam and thrombin at C6-C7 level.  Diskectomy was done with  pituitary rongeurs and curettes.  A 1- and 2-mm Kerrison punches were  used to remove the posterior disk, ligaments, osteophytes.  Bilateral  foraminotomies were done where we got good central decompression.  We  measured the height of the disk space to be 6 mm.  A high-speed drill  was used to remove cartilaginous endplate along with the 6-mm broach and  then 6-mm left allograft bone was tapped into place and both the C5-C6  and C6-C7 levels were irrigated with antibiotic solution.  We had a good  hemostasis and distraction and distraction pin  removed.  Hemostasis was  obtained with Gelfoam and thrombin.  Weight was removed from the  traction.  A Trestle anterior cervical plate was placed over the  anterior cervical spine.  Two screws were placed in the C5, 2 in the C6,  and 2 in the C7, these were tightened down and another x-ray was  obtained showing that we could see the screws and plate starting at C5,  but could not see the bone plugs  or the rest of the plate due to the  body habitus.  Intraop, they were in very good position.  Retractors  removed.  Hemostasis obtained with bipolar cauterization.  We irrigated  with antibiotic solution and we had good hemostasis, and the platysma  was closed with 3-0 Vicryl interrupted sutures.  Subcutaneous tissue  closed with the same and skin closed with benzoin and Steri-Strips.  Dressing was placed.  The patient was placed in a soft cervical collar,  awaken from anesthesia and transferred to recovery room in stable  condition.           ______________________________  Otilio Connors, M.D.     JRH/MEDQ  D:  02/25/2008  T:  02/26/2008  Job:  [id number]

## 2010-11-05 ENCOUNTER — Encounter (HOSPITAL_BASED_OUTPATIENT_CLINIC_OR_DEPARTMENT_OTHER): Payer: Medicare Other | Admitting: Oncology

## 2010-11-05 ENCOUNTER — Other Ambulatory Visit (HOSPITAL_COMMUNITY): Payer: Self-pay | Admitting: Oncology

## 2010-11-05 DIAGNOSIS — D509 Iron deficiency anemia, unspecified: Secondary | ICD-10-CM

## 2010-11-05 DIAGNOSIS — N289 Disorder of kidney and ureter, unspecified: Secondary | ICD-10-CM

## 2010-11-05 DIAGNOSIS — D649 Anemia, unspecified: Secondary | ICD-10-CM

## 2010-11-05 LAB — COMPREHENSIVE METABOLIC PANEL
ALT: 23 U/L (ref 0–35)
AST: 24 U/L (ref 0–37)
BUN: 39 mg/dL — ABNORMAL HIGH (ref 6–23)
Calcium: 9.6 mg/dL (ref 8.4–10.5)
Glucose, Bld: 156 mg/dL — ABNORMAL HIGH (ref 70–99)
Potassium: 4.4 mEq/L (ref 3.5–5.3)
Total Protein: 7.4 g/dL (ref 6.0–8.3)

## 2010-11-05 LAB — CBC WITH DIFFERENTIAL/PLATELET
BASO%: 0.3 % (ref 0.0–2.0)
Basophils Absolute: 0 10*3/uL (ref 0.0–0.1)
EOS%: 2.1 % (ref 0.0–7.0)
HCT: 33.3 % — ABNORMAL LOW (ref 34.8–46.6)
HGB: 10.9 g/dL — ABNORMAL LOW (ref 11.6–15.9)
LYMPH%: 20.3 % (ref 14.0–49.7)
MCH: 27.1 pg (ref 25.1–34.0)
MCHC: 32.9 g/dL (ref 31.5–36.0)
MCV: 82.5 fL (ref 79.5–101.0)
MONO%: 10.1 % (ref 0.0–14.0)
NEUT%: 67.2 % (ref 38.4–76.8)

## 2010-11-05 LAB — FERRITIN: Ferritin: 258 ng/mL (ref 10–291)

## 2010-11-05 LAB — IRON AND TIBC
%SAT: 20 % (ref 20–55)
TIBC: 285 ug/dL (ref 250–470)
UIBC: 227 ug/dL

## 2011-01-07 ENCOUNTER — Other Ambulatory Visit (HOSPITAL_COMMUNITY): Payer: Self-pay | Admitting: Oncology

## 2011-01-07 ENCOUNTER — Encounter (HOSPITAL_BASED_OUTPATIENT_CLINIC_OR_DEPARTMENT_OTHER): Payer: Medicare Other | Admitting: Oncology

## 2011-01-07 DIAGNOSIS — N289 Disorder of kidney and ureter, unspecified: Secondary | ICD-10-CM

## 2011-01-07 DIAGNOSIS — D649 Anemia, unspecified: Secondary | ICD-10-CM

## 2011-01-07 DIAGNOSIS — D509 Iron deficiency anemia, unspecified: Secondary | ICD-10-CM

## 2011-01-07 LAB — CBC WITH DIFFERENTIAL/PLATELET
BASO%: 0.4 % (ref 0.0–2.0)
EOS%: 1.8 % (ref 0.0–7.0)
HCT: 33.2 % — ABNORMAL LOW (ref 34.8–46.6)
LYMPH%: 19.7 % (ref 14.0–49.7)
MCH: 27.9 pg (ref 25.1–34.0)
MCHC: 33.1 g/dL (ref 31.5–36.0)
MCV: 84.4 fL (ref 79.5–101.0)
NEUT%: 67.5 % (ref 38.4–76.8)
Platelets: 167 10*3/uL (ref 145–400)

## 2011-01-22 ENCOUNTER — Other Ambulatory Visit (HOSPITAL_COMMUNITY): Payer: Self-pay | Admitting: Internal Medicine

## 2011-01-22 DIAGNOSIS — Z139 Encounter for screening, unspecified: Secondary | ICD-10-CM

## 2011-01-29 LAB — URINALYSIS, ROUTINE W REFLEX MICROSCOPIC
Bilirubin Urine: NEGATIVE
Glucose, UA: NEGATIVE
Hgb urine dipstick: NEGATIVE
Nitrite: POSITIVE — AB
Protein, ur: NEGATIVE
Protein, ur: NEGATIVE
Specific Gravity, Urine: 1.019
Urobilinogen, UA: 0.2
Urobilinogen, UA: 1

## 2011-01-29 LAB — GLUCOSE, CAPILLARY
Glucose-Capillary: 105 — ABNORMAL HIGH
Glucose-Capillary: 153 — ABNORMAL HIGH
Glucose-Capillary: 174 — ABNORMAL HIGH
Glucose-Capillary: 197 — ABNORMAL HIGH
Glucose-Capillary: 229 — ABNORMAL HIGH

## 2011-01-29 LAB — BASIC METABOLIC PANEL
CO2: 23
Calcium: 9.5
Creatinine, Ser: 1.6 — ABNORMAL HIGH
GFR calc Af Amer: 38 — ABNORMAL LOW
GFR calc non Af Amer: 32 — ABNORMAL LOW
Sodium: 140

## 2011-01-29 LAB — CBC
Hemoglobin: 11.5 — ABNORMAL LOW
MCHC: 32.3
RBC: 4.19
WBC: 5.5

## 2011-01-29 LAB — URINE CULTURE: Culture: NO GROWTH

## 2011-01-29 LAB — APTT: aPTT: 33

## 2011-01-29 LAB — PROTIME-INR
INR: 1
Prothrombin Time: 13.7

## 2011-01-29 LAB — URINE MICROSCOPIC-ADD ON

## 2011-02-08 ENCOUNTER — Ambulatory Visit (HOSPITAL_COMMUNITY)
Admission: RE | Admit: 2011-02-08 | Discharge: 2011-02-08 | Disposition: A | Discharge status: Home / Self Care | Payer: Medicare Other | Source: Ambulatory Visit | Attending: Internal Medicine | Admitting: Internal Medicine

## 2011-02-08 DIAGNOSIS — Z139 Encounter for screening, unspecified: Secondary | ICD-10-CM

## 2011-02-08 DIAGNOSIS — Z1231 Encounter for screening mammogram for malignant neoplasm of breast: Secondary | ICD-10-CM | POA: Insufficient documentation

## 2011-02-14 ENCOUNTER — Other Ambulatory Visit: Payer: Self-pay | Admitting: Internal Medicine

## 2011-02-14 DIAGNOSIS — R928 Other abnormal and inconclusive findings on diagnostic imaging of breast: Secondary | ICD-10-CM

## 2011-02-16 ENCOUNTER — Other Ambulatory Visit: Payer: Self-pay | Admitting: Oncology

## 2011-02-16 DIAGNOSIS — N189 Chronic kidney disease, unspecified: Secondary | ICD-10-CM

## 2011-02-16 DIAGNOSIS — D649 Anemia, unspecified: Secondary | ICD-10-CM

## 2011-02-16 DIAGNOSIS — D631 Anemia in chronic kidney disease: Secondary | ICD-10-CM | POA: Insufficient documentation

## 2011-02-16 MED ORDER — DARBEPOETIN ALFA-POLYSORBATE 200 MCG/ML IJ SOLN: 200.0000 ug | Freq: Once | INTRAMUSCULAR | Status: DC

## 2011-02-20 ENCOUNTER — Ambulatory Visit (HOSPITAL_COMMUNITY)
Admission: RE | Admit: 2011-02-20 | Discharge: 2011-02-20 | Disposition: A | Discharge status: Home / Self Care | Payer: Medicare Other | Source: Ambulatory Visit | Attending: Internal Medicine | Admitting: Internal Medicine

## 2011-02-20 DIAGNOSIS — R928 Other abnormal and inconclusive findings on diagnostic imaging of breast: Secondary | ICD-10-CM | POA: Insufficient documentation

## 2011-03-04 ENCOUNTER — Other Ambulatory Visit (HOSPITAL_COMMUNITY): Payer: Self-pay | Admitting: Oncology

## 2011-03-04 ENCOUNTER — Other Ambulatory Visit (HOSPITAL_BASED_OUTPATIENT_CLINIC_OR_DEPARTMENT_OTHER): Payer: Medicare Other | Admitting: Lab

## 2011-03-04 ENCOUNTER — Ambulatory Visit: Payer: Medicare Other

## 2011-03-04 DIAGNOSIS — N039 Chronic nephritic syndrome with unspecified morphologic changes: Secondary | ICD-10-CM

## 2011-03-04 DIAGNOSIS — N189 Chronic kidney disease, unspecified: Secondary | ICD-10-CM

## 2011-03-04 DIAGNOSIS — D631 Anemia in chronic kidney disease: Secondary | ICD-10-CM

## 2011-03-04 LAB — CBC WITH DIFFERENTIAL/PLATELET
BASO%: 0.5 % (ref 0.0–2.0)
EOS%: 1.1 % (ref 0.0–7.0)
LYMPH%: 26.4 % (ref 14.0–49.7)
MCHC: 33 g/dL (ref 31.5–36.0)
MCV: 80.6 fL (ref 79.5–101.0)
MONO%: 9.3 % (ref 0.0–14.0)
NEUT#: 3.8 10*3/uL (ref 1.5–6.5)
Platelets: 193 10*3/uL (ref 145–400)
RBC: 4.17 10*6/uL (ref 3.70–5.45)
RDW: 14.1 % (ref 11.2–14.5)
WBC: 6.1 10*3/uL (ref 3.9–10.3)
nRBC: 0 % (ref 0–0)

## 2011-03-10 ENCOUNTER — Telehealth: Payer: Self-pay | Admitting: Oncology

## 2011-03-12 ENCOUNTER — Other Ambulatory Visit: Payer: Self-pay | Admitting: Oncology

## 2011-03-12 DIAGNOSIS — D631 Anemia in chronic kidney disease: Secondary | ICD-10-CM

## 2011-04-19 ENCOUNTER — Telehealth: Payer: Self-pay | Admitting: Oncology

## 2011-04-19 NOTE — Telephone Encounter (Signed)
appt called and made aware of new appt time    aom

## 2011-05-06 ENCOUNTER — Ambulatory Visit: Payer: Medicare Other | Admitting: Oncology

## 2011-05-06 ENCOUNTER — Other Ambulatory Visit: Payer: Medicare Other | Admitting: Lab

## 2011-05-13 NOTE — Telephone Encounter (Signed)
nA

## 2011-05-22 ENCOUNTER — Encounter: Payer: Self-pay | Admitting: Medical Oncology

## 2011-05-23 ENCOUNTER — Other Ambulatory Visit: Payer: Medicare Other

## 2011-05-23 ENCOUNTER — Encounter: Payer: Self-pay | Admitting: Oncology

## 2011-05-23 ENCOUNTER — Ambulatory Visit (HOSPITAL_BASED_OUTPATIENT_CLINIC_OR_DEPARTMENT_OTHER): Payer: Medicare Other | Admitting: Oncology

## 2011-05-23 VITALS — BP 107/62 | HR 87 | Temp 98.2°F | Ht 61.0 in | Wt 192.5 lb

## 2011-05-23 DIAGNOSIS — D631 Anemia in chronic kidney disease: Secondary | ICD-10-CM

## 2011-05-23 DIAGNOSIS — N189 Chronic kidney disease, unspecified: Secondary | ICD-10-CM

## 2011-05-23 LAB — IRON AND TIBC
%SAT: 24 % (ref 20–55)
Iron: 78 ug/dL (ref 42–145)

## 2011-05-23 LAB — COMPREHENSIVE METABOLIC PANEL
ALT: 20 U/L (ref 0–35)
Albumin: 3.8 g/dL (ref 3.5–5.2)
Alkaline Phosphatase: 88 U/L (ref 39–117)
CO2: 23 mEq/L (ref 19–32)
Potassium: 3.9 mEq/L (ref 3.5–5.3)
Sodium: 136 mEq/L (ref 135–145)
Total Bilirubin: 0.2 mg/dL — ABNORMAL LOW (ref 0.3–1.2)
Total Protein: 7.7 g/dL (ref 6.0–8.3)

## 2011-05-23 LAB — CBC WITH DIFFERENTIAL/PLATELET
BASO%: 0.8 % (ref 0.0–2.0)
Eosinophils Absolute: 0.1 10*3/uL (ref 0.0–0.5)
LYMPH%: 21.3 % (ref 14.0–49.7)
MCHC: 33.6 g/dL (ref 31.5–36.0)
MONO#: 0.5 10*3/uL (ref 0.1–0.9)
NEUT#: 2.6 10*3/uL (ref 1.5–6.5)
Platelets: 168 10*3/uL (ref 145–400)
RBC: 3.59 10*6/uL — ABNORMAL LOW (ref 3.70–5.45)
RDW: 14.1 % (ref 11.2–14.5)
WBC: 4.2 10*3/uL (ref 3.9–10.3)

## 2011-05-23 LAB — LACTATE DEHYDROGENASE: LDH: 215 U/L (ref 94–250)

## 2011-05-23 MED ORDER — DARBEPOETIN ALFA-POLYSORBATE 200 MCG/0.4ML IJ SOLN
200.0000 ug | Freq: Once | INTRAMUSCULAR | Status: AC
  Administered 2011-05-23: 200 ug via SUBCUTANEOUS

## 2011-05-23 NOTE — Progress Notes (Signed)
CC:   Nehemiah Settle, M.D.  PROBLEM LIST: 1. Anemia secondary to renal insufficiency.  Currently on Aranesp     injections as needed for hemoglobin less than or equal to 11. 2. Renal insufficiency. 3. Spinal stenosis at L4-5 and L5-6 as per MRI of the lumbar spine on     06/16/2010. 4. Hypertension. 5. Diabetes mellitus type 2. 6. Systolic ejection murmur. 7. Status post cervical spine surgery by Dr. Hazle Coca in October     2010.  MEDICATIONS: 1. Caduet (amlodipine-atorvastatin) 10-40 one pill daily. 2. Aspirin 160 mg daily. 3. Calcium carbonate and calciferol 600 mg-1000 mg 1 tablet twice a     day. 4. Aranesp 200 mcg subcutaneous every 2 months as needed for     hemoglobin less than or equal to 11. 5. Ferrous sulfate 325 mg daily. 6. Glyburide 6 mg twice daily. 7. Multivitamins daily. 8. Nitro-Dur 0.2 mg/hour daily. 9. Actos 45 mg daily. 10.Systane 2 drops to eye 4 times a day as needed. 11.Ultram 50 mg every 6 hours as needed. 12.Diovan 160 mg daily. 13.Vitamin D 400 units twice a day.  HISTORY:  Lindsey Mcguire was last seen by [place] on 11/05/2010.  She has been having ongoing problems with spinal stenosis and radiation of pain into her left hip and down her left leg.  A trial of epidural injections apparently was not beneficial.  Mrs. Hemphill has not required Aranesp very often.  We check her CBC every couple of months for her anemia associated with renal insufficiency.  She last received Aranesp 200 mcg subcutaneous on July 9th when her hemoglobin was 10.9, and again on 06/28/2010 when her hemoglobin was 10.3.  She feels that these injections are helpful with her energy.  There has been no adverse events.  It should be noted that Mrs. Julia and did require intravenous iron in the form INFeD 1000 mg on 02/16/2004.  At 1 time she had an elevated sedimentation rate of 55 back in September 2005.  PHYSICAL EXAM:  General: Mrs. Miska looks well and is in no acute distress.   She is now 77 years old.  Vital signs: Weight is 192.5 pounds.  Height 5 feet 1 inch, body surface area 1.94 m squared.  Blood pressure 107/62.  Other vital signs are normal.  HEENT: There is no scleral icterus.  Mouth and pharynx are benign.  No peripheral adenopathy palpable.  Lungs are clear to percussion and auscultation. Cardiac examination:  Regular rhythm with systolic ejection murmur. Abdomen:  Obese, nontender, with no organomegaly or masses palpable. Extremities: No peripheral edema or clubbing.  Neurologic exam is grossly normal.  She does not have a Port-A-Cath or central catheter.  LABORATORY DATA:  Today, white count 4.2, ANC 2.6, hemoglobin 10.2, hematocrit 30.3, and platelets a 168,000.  On 11/05, hemoglobin was 11.1, and on 09/10 hemoglobin was 11.0.  Chemistries today: BUN was 33, creatinine 1.81.  Otherwise normal.  On 11/05/2010, BUN was 39, creatinine 2.08.  Iron studies today are pending.  On 11/05/2010, ferritin was 258, iron saturation was 20%.  On 11/16/2009, ferritin was 135 and iron saturation 23%.  IMAGING STUDIES: 1. CT scan of the abdomen and pelvis with IV contrast on 05/15/2010     showed rectal fecal impaction without proximal bowel obstruction.     There was a small amount of adjacent free fluid in the pelvis.     There is a calcified appendicolith without acute appendicitis.     There was an umbilical hernia  containing fat, as well as a non-     incarcerated small bowel loop.  The CT scan was obtained when the     patient apparently went to the emergency room. 2. MRI of the lumbar spine without contrast on 06/16/2010 showed     moderate spinal stenosis at L4-5 unchanged.  There was moderate     spinal stenosis at L5-S1 with progression of slip at L5 on S1 with     advanced facet degeneration.  There was interval development of     left foraminal and left lateral disk protrusion at L5-S1 compared     with the prior MRI carried out on 06/14/2007. 3.  Digital screening mammograms on 02/08/2011 and 02/20/2011 showed no     significant abnormalities.  Special views of the left breast were     carried out as part of that exam.  IMPRESSION AND PLAN:  Mrs. Schorer will receive Aranesp today at her request given her hemoglobin of 10.2.  She feels that the Aranesp 200 mcg  subcutaneous is helping her energy, and she feels the difference when she gets these injections.  Her cutoff is hemoglobin of 11.  We will continue to check CBCs every 2 months and give Aranesp whenever the hemoglobin is less than or equal to 11.  We will plan to see Mrs. Seki again in 6 months at which time we will check CBC, chemistries, and iron studies.    ______________________________ Jeanie Cooks, M.D. DSM/MEDQ  D:  05/23/2011  T:  05/23/2011  Job:  [id number]

## 2011-05-23 NOTE — Progress Notes (Signed)
This office note has been dictated.  [id number]

## 2011-05-27 ENCOUNTER — Telehealth: Payer: Self-pay | Admitting: Oncology

## 2011-05-27 NOTE — Telephone Encounter (Signed)
S/w the pt and she is aware of her march 2013 appts and to pick up her revised march-aug 2013 appt calendars

## 2011-07-02 ENCOUNTER — Other Ambulatory Visit: Payer: Medicare Other | Admitting: Lab

## 2011-07-02 ENCOUNTER — Ambulatory Visit: Payer: Medicare Other

## 2011-07-17 ENCOUNTER — Other Ambulatory Visit: Payer: Self-pay

## 2011-07-17 DIAGNOSIS — D631 Anemia in chronic kidney disease: Secondary | ICD-10-CM

## 2011-07-18 ENCOUNTER — Ambulatory Visit: Payer: Medicare Other

## 2011-07-18 ENCOUNTER — Other Ambulatory Visit (HOSPITAL_BASED_OUTPATIENT_CLINIC_OR_DEPARTMENT_OTHER): Payer: Medicare Other | Admitting: Lab

## 2011-07-18 ENCOUNTER — Telehealth: Payer: Self-pay | Admitting: Oncology

## 2011-07-18 DIAGNOSIS — D631 Anemia in chronic kidney disease: Secondary | ICD-10-CM

## 2011-07-18 DIAGNOSIS — N189 Chronic kidney disease, unspecified: Secondary | ICD-10-CM

## 2011-07-18 LAB — CBC WITH DIFFERENTIAL/PLATELET
BASO%: 0.5 % (ref 0.0–2.0)
HCT: 35.6 % (ref 34.8–46.6)
LYMPH%: 31.6 % (ref 14.0–49.7)
MCH: 28.1 pg (ref 25.1–34.0)
MCHC: 33.5 g/dL (ref 31.5–36.0)
MCV: 83.8 fL (ref 79.5–101.0)
MONO#: 0.5 10*3/uL (ref 0.1–0.9)
MONO%: 12.3 % (ref 0.0–14.0)
NEUT%: 53.4 % (ref 38.4–76.8)
Platelets: 153 10*3/uL (ref 145–400)
RBC: 4.25 10*6/uL (ref 3.70–5.45)

## 2011-07-18 NOTE — Telephone Encounter (Signed)
Pt stopped by today and was given remaining appts for may thru sept.

## 2011-07-18 NOTE — Progress Notes (Signed)
Pt's hgb 11.9. No Aranesp needed today. Pt given a copy of her labs and explanation of not needing injection. She verbalized understanding and satisfaction. Pt also verbalized upcoming appointments.

## 2011-08-27 ENCOUNTER — Other Ambulatory Visit: Payer: Medicare Other | Admitting: Lab

## 2011-08-27 ENCOUNTER — Ambulatory Visit: Payer: Medicare Other

## 2011-09-12 ENCOUNTER — Other Ambulatory Visit (HOSPITAL_BASED_OUTPATIENT_CLINIC_OR_DEPARTMENT_OTHER): Payer: Medicare Other

## 2011-09-12 ENCOUNTER — Ambulatory Visit: Payer: Medicare Other

## 2011-09-12 DIAGNOSIS — N189 Chronic kidney disease, unspecified: Secondary | ICD-10-CM

## 2011-09-12 DIAGNOSIS — N039 Chronic nephritic syndrome with unspecified morphologic changes: Secondary | ICD-10-CM

## 2011-09-12 DIAGNOSIS — D631 Anemia in chronic kidney disease: Secondary | ICD-10-CM

## 2011-09-12 LAB — CBC WITH DIFFERENTIAL/PLATELET
BASO%: 0.8 % (ref 0.0–2.0)
EOS%: 1.8 % (ref 0.0–7.0)
HCT: 34.1 % — ABNORMAL LOW (ref 34.8–46.6)
LYMPH%: 29.5 % (ref 14.0–49.7)
MCH: 26.9 pg (ref 25.1–34.0)
MCHC: 32.4 g/dL (ref 31.5–36.0)
NEUT%: 54.5 % (ref 38.4–76.8)
RBC: 4.1 10*6/uL (ref 3.70–5.45)
WBC: 4.8 10*3/uL (ref 3.9–10.3)
lymph#: 1.4 10*3/uL (ref 0.9–3.3)
nRBC: 0 % (ref 0–0)

## 2011-09-12 MED ORDER — DARBEPOETIN ALFA-POLYSORBATE 200 MCG/0.4ML IJ SOLN: 200.0000 ug | Freq: Once | INTRAMUSCULAR | Status: DC

## 2011-10-22 ENCOUNTER — Ambulatory Visit: Payer: Medicare Other

## 2011-10-22 ENCOUNTER — Other Ambulatory Visit: Payer: Medicare Other | Admitting: Lab

## 2011-11-07 ENCOUNTER — Telehealth: Payer: Self-pay | Admitting: Oncology

## 2011-11-07 ENCOUNTER — Encounter: Payer: Self-pay | Admitting: Oncology

## 2011-11-07 ENCOUNTER — Ambulatory Visit (HOSPITAL_BASED_OUTPATIENT_CLINIC_OR_DEPARTMENT_OTHER): Payer: Medicare Other | Admitting: Lab

## 2011-11-07 ENCOUNTER — Other Ambulatory Visit (HOSPITAL_BASED_OUTPATIENT_CLINIC_OR_DEPARTMENT_OTHER): Payer: Medicare Other | Admitting: Lab

## 2011-11-07 ENCOUNTER — Ambulatory Visit: Payer: Medicare Other

## 2011-11-07 ENCOUNTER — Ambulatory Visit (HOSPITAL_BASED_OUTPATIENT_CLINIC_OR_DEPARTMENT_OTHER): Payer: Medicare Other | Admitting: Oncology

## 2011-11-07 VITALS — BP 92/51 | HR 97 | Temp 97.4°F | Ht 61.0 in | Wt 197.0 lb

## 2011-11-07 DIAGNOSIS — N189 Chronic kidney disease, unspecified: Secondary | ICD-10-CM

## 2011-11-07 DIAGNOSIS — D649 Anemia, unspecified: Secondary | ICD-10-CM

## 2011-11-07 DIAGNOSIS — I10 Essential (primary) hypertension: Secondary | ICD-10-CM

## 2011-11-07 DIAGNOSIS — D631 Anemia in chronic kidney disease: Secondary | ICD-10-CM

## 2011-11-07 DIAGNOSIS — N289 Disorder of kidney and ureter, unspecified: Secondary | ICD-10-CM

## 2011-11-07 DIAGNOSIS — E119 Type 2 diabetes mellitus without complications: Secondary | ICD-10-CM

## 2011-11-07 LAB — CBC WITH DIFFERENTIAL/PLATELET
EOS%: 1.4 % (ref 0.0–7.0)
MCH: 27.5 pg (ref 25.1–34.0)
MCV: 83.8 fL (ref 79.5–101.0)
MONO%: 10.8 % (ref 0.0–14.0)
NEUT#: 3.9 10*3/uL (ref 1.5–6.5)
RBC: 3.61 10*6/uL — ABNORMAL LOW (ref 3.70–5.45)
RDW: 14.7 % — ABNORMAL HIGH (ref 11.2–14.5)
lymph#: 1.1 10*3/uL (ref 0.9–3.3)

## 2011-11-07 LAB — COMPREHENSIVE METABOLIC PANEL
ALT: 19 U/L (ref 0–35)
AST: 24 U/L (ref 0–37)
Albumin: 4 g/dL (ref 3.5–5.2)
CO2: 22 mEq/L (ref 19–32)
Calcium: 9.6 mg/dL (ref 8.4–10.5)
Chloride: 104 mEq/L (ref 96–112)
Potassium: 3.9 mEq/L (ref 3.5–5.3)
Total Protein: 7.5 g/dL (ref 6.0–8.3)

## 2011-11-07 LAB — LACTATE DEHYDROGENASE: LDH: 199 U/L (ref 94–250)

## 2011-11-07 LAB — IRON AND TIBC: TIBC: 308 ug/dL (ref 250–470)

## 2011-11-07 MED ORDER — DARBEPOETIN ALFA-POLYSORBATE 200 MCG/0.4ML IJ SOLN
200.0000 ug | Freq: Once | INTRAMUSCULAR | Status: AC
  Administered 2011-11-07: 200 ug via SUBCUTANEOUS
  Filled 2011-11-07: qty 0.4

## 2011-11-07 NOTE — Telephone Encounter (Signed)
appts made and printed for pt

## 2011-11-07 NOTE — Progress Notes (Signed)
This office note has been dictated.  [id number]

## 2011-11-07 NOTE — Progress Notes (Signed)
CC:   Nehemiah Settle, M.D.   PROBLEM LIST:  1. Anemia secondary to renal insufficiency. Currently on Aranesp  injections as needed for hemoglobin less than or equal to 11.  2. Renal insufficiency.  3. Spinal stenosis at L4-5 and L5-6 as per MRI of the lumbar spine on  06/16/2010.  4. Hypertension.  5. Diabetes mellitus type 2.  6. Systolic ejection murmur.  7. Status post cervical spine surgery by Dr. Hazle Coca in October  2010.    MEDICATIONS:  1. Caduet (amlodipine-atorvastatin) 10-40 one pill daily.  2. Aspirin 160 mg daily.  3. Calcium carbonate and calciferol 600 mg-1000 mg 1 tablet twice a  day.  4. Aranesp 200 mcg subcutaneous every 2 months as needed for  hemoglobin less than or equal to 11.  5. Ferrous sulfate 325 mg daily.  6. Glyburide 6 mg twice daily.  7. Multivitamins daily.  8. Nitro-Dur 0.2 mg/hour daily.  9. Actos 45 mg daily.  10.Systane 2 drops to eye 4 times a day as needed.  11.Ultram 50 mg every 6 hours as needed.  12.Diovan 160 mg daily.  13.Vitamin D 400 units twice a day.    HISTORY:  I saw Lindsey Mcguire today for followup of her anemia related to renal insufficiency.  Lindsey Mcguire was last seen by [place] on 05/23/2011. Her condition has remained fairly stable.  She does have some ongoing problems with pain in her back and left hip related to spinal stenosis. She occasionally uses a cane which she holds in her right hand.  Lindsey Mcguire has not required any Aranesp since 05/23/2011.  Her hemoglobin has remained greater than or equal to 11.  The patient is without any complaints today.  She tells me that she has seen Dr. Erling Cruz regarding her renal insufficiency.  PHYSICAL EXAM:  There is little change.  She looks fairly well, and recently celebrated her 77th birthday.  Weight is 197 pounds, height 5 feet 1 inch, body surface area 1.96 m2.  Blood pressure today is 92/51. Other vital signs are normal.  There is no scleral icterus.  Mouth  and pharynx are benign.  No peripheral adenopathy palpable.  Lungs:  Clear to percussion and auscultation.  Cardiac:  Regular rhythm with systolic ejection murmur.  Abdomen:  Obese, nontender with no organomegaly or masses palpable.  Extremities:  No peripheral edema or clubbing. Neurologic:  Normal.  The patient does not have a Port-A-Cath or central catheter.  LABORATORY DATA:  Today, white count 5.7, ANC 3.9, hemoglobin 9.9, hematocrit 30.3, platelets 148,000.  Red cell indices are normal.  The patient will need to go back to the lab to have chemistries and iron studies drawn as apparently these were not done.  On 05/23/2011 chemistries were notable for a BUN of 33, creatinine 1.81.  Ferritin was 246, and iron saturation 24%.  IMAGING STUDIES:  1. CT scan of the abdomen and pelvis with IV contrast on 05/15/2010  showed rectal fecal impaction without proximal bowel obstruction.  There was a small amount of adjacent free fluid in the pelvis.  There is a calcified appendicolith without acute appendicitis.  There was an umbilical hernia containing fat, as well as a non-  incarcerated small bowel loop. The CT scan was obtained when the  patient apparently went to the emergency room.  2. MRI of the lumbar spine without contrast on 06/16/2010 showed  moderate spinal stenosis at L4-5 unchanged. There was moderate  spinal stenosis at L5-S1 with progression  of slip at L5 on S1 with  advanced facet degeneration. There was interval development of  left foraminal and left lateral disk protrusion at L5-S1 compared  with the prior MRI carried out on 06/14/2007.  3. Digital screening mammograms on 02/08/2011 and 02/20/2011 showed no  significant abnormalities. Special views of the left breast were  carried out as part of that exam.   IMPRESSION AND PLAN:  Lindsey Mcguire will receive Aranesp today 200 mcg subcu for a hemoglobin of 9.9.  Will continue to check CBCs every 2 months and administer  Aranesp 200 mcg subcu whenever the hemoglobin is less than or equal to 11.  We will plan to see Lindsey Mcguire again in 6 months at which time we will check CBC, chemistries, and iron studies. She can be seen by the mid level on that visit.    ______________________________ Jeanie Cooks, M.D. DSM/MEDQ  D:  11/07/2011  T:  11/07/2011  Job:  [id number]

## 2011-11-19 ENCOUNTER — Ambulatory Visit: Payer: Medicare Other | Admitting: Oncology

## 2011-11-19 ENCOUNTER — Other Ambulatory Visit: Payer: Medicare Other | Admitting: Lab

## 2011-12-17 ENCOUNTER — Ambulatory Visit: Payer: Medicare Other

## 2011-12-17 ENCOUNTER — Other Ambulatory Visit: Payer: Medicare Other | Admitting: Lab

## 2012-01-02 ENCOUNTER — Ambulatory Visit: Payer: Medicare Other

## 2012-01-02 ENCOUNTER — Other Ambulatory Visit (HOSPITAL_BASED_OUTPATIENT_CLINIC_OR_DEPARTMENT_OTHER): Payer: Medicare Other

## 2012-01-02 DIAGNOSIS — N189 Chronic kidney disease, unspecified: Secondary | ICD-10-CM

## 2012-01-02 DIAGNOSIS — D631 Anemia in chronic kidney disease: Secondary | ICD-10-CM

## 2012-01-02 LAB — CBC WITH DIFFERENTIAL/PLATELET
Basophils Absolute: 0 10*3/uL (ref 0.0–0.1)
Eosinophils Absolute: 0.1 10*3/uL (ref 0.0–0.5)
HCT: 34.1 % — ABNORMAL LOW (ref 34.8–46.6)
HGB: 11.4 g/dL — ABNORMAL LOW (ref 11.6–15.9)
LYMPH%: 24 % (ref 14.0–49.7)
MONO#: 0.5 10*3/uL (ref 0.1–0.9)
NEUT#: 2.5 10*3/uL (ref 1.5–6.5)
Platelets: 149 10*3/uL (ref 145–400)
RBC: 4.19 10*6/uL (ref 3.70–5.45)
WBC: 4 10*3/uL (ref 3.9–10.3)
nRBC: 0 % (ref 0–0)

## 2012-01-02 MED ORDER — DARBEPOETIN ALFA-POLYSORBATE 200 MCG/0.4ML IJ SOLN: 200.0000 ug | Freq: Once | INTRAMUSCULAR | Status: DC

## 2012-01-15 ENCOUNTER — Other Ambulatory Visit (HOSPITAL_COMMUNITY): Payer: Self-pay | Admitting: Internal Medicine

## 2012-01-15 DIAGNOSIS — Z139 Encounter for screening, unspecified: Secondary | ICD-10-CM

## 2012-02-10 ENCOUNTER — Ambulatory Visit (HOSPITAL_COMMUNITY)
Admission: RE | Admit: 2012-02-10 | Discharge: 2012-02-10 | Disposition: A | Discharge status: Home / Self Care | Payer: Medicare Other | Source: Ambulatory Visit | Attending: Internal Medicine | Admitting: Internal Medicine

## 2012-02-10 DIAGNOSIS — Z1231 Encounter for screening mammogram for malignant neoplasm of breast: Secondary | ICD-10-CM | POA: Insufficient documentation

## 2012-02-10 DIAGNOSIS — Z139 Encounter for screening, unspecified: Secondary | ICD-10-CM

## 2012-03-05 ENCOUNTER — Other Ambulatory Visit (HOSPITAL_BASED_OUTPATIENT_CLINIC_OR_DEPARTMENT_OTHER): Payer: Medicare Other | Admitting: Lab

## 2012-03-05 ENCOUNTER — Ambulatory Visit (HOSPITAL_BASED_OUTPATIENT_CLINIC_OR_DEPARTMENT_OTHER): Payer: Medicare Other

## 2012-03-05 VITALS — BP 103/60 | HR 78 | Temp 98.7°F

## 2012-03-05 DIAGNOSIS — N289 Disorder of kidney and ureter, unspecified: Secondary | ICD-10-CM

## 2012-03-05 DIAGNOSIS — N189 Chronic kidney disease, unspecified: Secondary | ICD-10-CM

## 2012-03-05 DIAGNOSIS — D649 Anemia, unspecified: Secondary | ICD-10-CM

## 2012-03-05 LAB — CBC WITH DIFFERENTIAL/PLATELET
Basophils Absolute: 0 10*3/uL (ref 0.0–0.1)
Eosinophils Absolute: 0.1 10*3/uL (ref 0.0–0.5)
HGB: 10.9 g/dL — ABNORMAL LOW (ref 11.6–15.9)
LYMPH%: 26.1 % (ref 14.0–49.7)
MCV: 83.3 fL (ref 79.5–101.0)
MONO#: 0.8 10*3/uL (ref 0.1–0.9)
MONO%: 12.7 % (ref 0.0–14.0)
NEUT#: 3.5 10*3/uL (ref 1.5–6.5)
Platelets: 138 10*3/uL — ABNORMAL LOW (ref 145–400)
RDW: 14.1 % (ref 11.2–14.5)
WBC: 6 10*3/uL (ref 3.9–10.3)

## 2012-03-05 MED ORDER — DARBEPOETIN ALFA-POLYSORBATE 200 MCG/0.4ML IJ SOLN
200.0000 ug | Freq: Once | INTRAMUSCULAR | Status: AC
  Administered 2012-03-05: 200 ug via SUBCUTANEOUS
  Filled 2012-03-05: qty 0.4

## 2012-05-07 ENCOUNTER — Other Ambulatory Visit (HOSPITAL_BASED_OUTPATIENT_CLINIC_OR_DEPARTMENT_OTHER): Payer: Medicare Other | Admitting: Lab

## 2012-05-07 ENCOUNTER — Telehealth: Payer: Self-pay | Admitting: Oncology

## 2012-05-07 ENCOUNTER — Ambulatory Visit (HOSPITAL_BASED_OUTPATIENT_CLINIC_OR_DEPARTMENT_OTHER): Payer: Medicare Other

## 2012-05-07 ENCOUNTER — Encounter: Payer: Self-pay | Admitting: Family

## 2012-05-07 ENCOUNTER — Ambulatory Visit (HOSPITAL_BASED_OUTPATIENT_CLINIC_OR_DEPARTMENT_OTHER): Payer: Medicare Other | Admitting: Family

## 2012-05-07 VITALS — BP 118/67 | HR 92 | Temp 97.5°F | Resp 20 | Ht 61.0 in | Wt 200.7 lb

## 2012-05-07 DIAGNOSIS — E119 Type 2 diabetes mellitus without complications: Secondary | ICD-10-CM

## 2012-05-07 DIAGNOSIS — N289 Disorder of kidney and ureter, unspecified: Secondary | ICD-10-CM

## 2012-05-07 DIAGNOSIS — N189 Chronic kidney disease, unspecified: Secondary | ICD-10-CM

## 2012-05-07 DIAGNOSIS — D649 Anemia, unspecified: Secondary | ICD-10-CM

## 2012-05-07 DIAGNOSIS — D638 Anemia in other chronic diseases classified elsewhere: Secondary | ICD-10-CM

## 2012-05-07 DIAGNOSIS — M4800 Spinal stenosis, site unspecified: Secondary | ICD-10-CM

## 2012-05-07 LAB — COMPREHENSIVE METABOLIC PANEL (CC13)
Alkaline Phosphatase: 91 U/L (ref 40–150)
CO2: 22 mEq/L (ref 22–29)
Creatinine: 2 mg/dL — ABNORMAL HIGH (ref 0.6–1.1)
Glucose: 135 mg/dl — ABNORMAL HIGH (ref 70–99)
Total Bilirubin: 0.36 mg/dL (ref 0.20–1.20)

## 2012-05-07 LAB — CBC WITH DIFFERENTIAL/PLATELET
Basophils Absolute: 0 10*3/uL (ref 0.0–0.1)
Eosinophils Absolute: 0.1 10*3/uL (ref 0.0–0.5)
HCT: 32.4 % — ABNORMAL LOW (ref 34.8–46.6)
LYMPH%: 21.1 % (ref 14.0–49.7)
MCV: 85.8 fL (ref 79.5–101.0)
MONO#: 0.6 10*3/uL (ref 0.1–0.9)
MONO%: 12 % (ref 0.0–14.0)
NEUT#: 3.1 10*3/uL (ref 1.5–6.5)
NEUT%: 63.7 % (ref 38.4–76.8)
Platelets: 152 10*3/uL (ref 145–400)
RBC: 3.78 10*6/uL (ref 3.70–5.45)
WBC: 4.9 10*3/uL (ref 3.9–10.3)

## 2012-05-07 LAB — IRON AND TIBC
%SAT: 22 % (ref 20–55)
Iron: 72 ug/dL (ref 42–145)

## 2012-05-07 LAB — LACTATE DEHYDROGENASE (CC13): LDH: 268 U/L — ABNORMAL HIGH (ref 125–245)

## 2012-05-07 MED ORDER — DARBEPOETIN ALFA-POLYSORBATE 200 MCG/0.4ML IJ SOLN
200.0000 ug | Freq: Once | INTRAMUSCULAR | Status: AC
  Administered 2012-05-07: 200 ug via SUBCUTANEOUS
  Filled 2012-05-07: qty 0.4

## 2012-05-07 NOTE — Telephone Encounter (Signed)
appts made and printed for pt aom °

## 2012-05-07 NOTE — Patient Instructions (Addendum)
Please contact us at [phone] if you have any questions or concerns.   Results for orders placed in visit on 05/07/12 (from the past 24 hour(s))  CBC WITH DIFFERENTIAL     Status: Abnormal   Collection Time   05/07/12 10:43 AM      Component Value Range   WBC 4.9  3.9 - 10.3 10e3/uL   NEUT# 3.1  1.5 - 6.5 10e3/uL   HGB 10.6 (*) 11.6 - 15.9 g/dL   HCT 32.4 (*) 34.8 - 46.6 %   Platelets 152  145 - 400 10e3/uL   MCV 85.8  79.5 - 101.0 fL   MCH 28.2  25.1 - 34.0 pg   MCHC 32.9  31.5 - 36.0 g/dL   RBC 3.78  3.70 - 5.45 10e6/uL   RDW 14.3  11.2 - 14.5 %   lymph# 1.0  0.9 - 3.3 10e3/uL   MONO# 0.6  0.1 - 0.9 10e3/uL   Eosinophils Absolute 0.1  0.0 - 0.5 10e3/uL   Basophils Absolute 0.0  0.0 - 0.1 10e3/uL   NEUT% 63.7  38.4 - 76.8 %   LYMPH% 21.1  14.0 - 49.7 %   MONO% 12.0  0.0 - 14.0 %   EOS% 2.6  0.0 - 7.0 %   BASO% 0.6  0.0 - 2.0 %   Narrative:    Performed At:  [facility] [employer].               [place], [place] [address]

## 2012-05-07 NOTE — Progress Notes (Signed)
Patient ID: Lindsey Mcguire, female   DOB: [birth year], 78 y.o.   MRN: [id number] CSN: [id number]  CC: Nehemiah Settle, MD Darrold Span. Florene Glen, MD  Problem List: Lindsey Mcguire is a 78 y.o. African-American female with a problem list consisting of:  1. Anemia secondary to renal insufficiency. Currently on Aranesp injections as needed for hemoglobin less than or equal to 11.  2. Renal insufficiency.  3. Spinal stenosis at L4-5 and L5-6 as per MRI of the lumbar spine on 06/16/2010.  4. Hypertension.  5. Diabetes mellitus type 2.  6. Systolic ejection murmur.  7. Status post cervical spine surgery by Dr. Hazle Coca in October 2010. 8. Last mammogram 02/11/2012 9. Last colonoscopy in 2008, next one due in 2018  Dr. Ralene Ok and I saw Lindsey Mcguire today for follow up of her anemia related to renal insufficiency. Lindsey Mcguire was last seen by [place] on 11/07/2011. Her condition has remained fairly stable. She does have some ongoing problems with pain in her back and left hip related to spinal stenosis. She occasionally uses a cane to ambulate.  Lindsey Mcguire received an Aranesp injection on  03/05/2012 when her hemoglobin was 10.9 and on 11/07/2011 when her hemoglobin was 9.9. She receives Aranesp injections when her hemoglobin is less than 11. The patient is without any major complaints today.  She states that she has occasional constipation related to oral iron intake which is resolved by taking Metamucil and drinking prune juice.  Lindsey Mcguire denies any other symptomatology. The patient reports she was last seen by Dr. Erling Cruz for renal insufficiency on 01/21/2012.  Past Medical History: Past Medical History  Diagnosis Date  . Diabetes mellitus   . Hypertension   . Anemia 10/2003    Surgical History: Past Surgical History  Procedure Date  . Tubal ligation 1977  . Cataract extraction 12/06/2003    left eye    Current Medications: Current Outpatient Prescriptions  Medication Sig Dispense  Refill  . amLODipine-atorvastatin (CADUET) 10-40 MG per tablet Take 1 tablet by mouth daily.      Marland Kitchen aspirin 81 MG tablet Take 81 mg by mouth daily.       . Calcium Carb-Cholecalciferol [id number] MG-UNIT CAPS Take 1 tablet by mouth 2 (two) times daily.       . darbepoetin alfa-polysorbate (ARANESP, ALBUMIN FREE,) 200 MCG/ML injection Inject 1 mL (200 mcg total) into the skin once. Once every 2 months  0.5 mL  prn  . ferrous sulfate 325 (65 FE) MG tablet Take 325 mg by mouth daily with breakfast.      . glyBURIDE micronized (GLYNASE) 6 MG tablet Take 6 mg by mouth 2 (two) times daily with a meal.      . Multiple Vitamin (MULTIVITAMIN) tablet Take 1 tablet by mouth daily.      . nitroGLYCERIN (NITRODUR - DOSED IN MG/24 HR) 0.2 mg/hr Place 1 patch onto the skin daily.      . pioglitazone (ACTOS) 45 MG tablet Take 45 mg by mouth daily.      Vladimir Faster Glycol-Propyl Glycol (SYSTANE OP) Apply 2 drops to eye 4 (four) times daily as needed.      . valsartan (DIOVAN) 160 MG tablet Take 320 mg by mouth daily.       . vitamin E 400 UNIT capsule Take 400 Units by mouth 2 (two) times daily.         Allergies: No Known Allergies  Family History: Family History  Problem Relation  Age of Onset  . Diabetes Mother   . Hypertension Mother   . Cancer Brother   . Diabetes Brother   . Hypertension Maternal Grandmother   . Diabetes Maternal Grandmother   . Cancer Brother   . Diabetes Brother     Social History: History  Substance Use Topics  . Smoking status: Never Smoker   . Smokeless tobacco: Never Used  . Alcohol Use: No    Review of Systems: 10 Point review of systems was completed and is negative except as noted above.   Physical Exam:   Blood pressure 118/67, pulse 92, temperature 97.5 F (36.4 C), temperature source Oral, resp. rate 20, height 5\' 1"  (1.549 m), weight 200 lb 11.2 oz (91.037 kg).  General appearance: Alert, cooperative, well nourished, no apparent distress Head:  Normocephalic, without obvious abnormality, atraumatic Eyes: Conjunctivae clear, arcus senilis,  PERRLA, EOMI Nose: Nares, septum and mucosa are normal, no drainage or sinus tenderness Neck: No adenopathy, supple, symmetrical, trachea midline, thyroid not enlarged, no tenderness Resp: Clear to auscultation bilaterally, diminished LLL Cardio: Regular rate and rhythm, S1, S2 normal, 1/6 murmur, no click, rub or gallop GI: Soft, non-tender, distended, normoactive bowel sounds, no organomegaly Extremities: Extremities normal, atraumatic, no cyanosis or edema Lymph nodes: Cervical, supraclavicular, and axillary nodes normal Neurologic: Grossly normal   Laboratory Data: Results for orders placed in visit on 05/07/12 (from the past 48 hour(s))  CBC WITH DIFFERENTIAL     Status: Abnormal   Collection Time   05/07/12 10:43 AM      Component Value Range Comment   WBC 4.9  3.9 - 10.3 10e3/uL    NEUT# 3.1  1.5 - 6.5 10e3/uL    HGB 10.6 (*) 11.6 - 15.9 g/dL    HCT 32.4 (*) 34.8 - 46.6 %    Platelets 152  145 - 400 10e3/uL    MCV 85.8  79.5 - 101.0 fL    MCH 28.2  25.1 - 34.0 pg    MCHC 32.9  31.5 - 36.0 g/dL    RBC 3.78  3.70 - 5.45 10e6/uL    RDW 14.3  11.2 - 14.5 %    lymph# 1.0  0.9 - 3.3 10e3/uL    MONO# 0.6  0.1 - 0.9 10e3/uL    Eosinophils Absolute 0.1  0.0 - 0.5 10e3/uL    Basophils Absolute 0.0  0.0 - 0.1 10e3/uL    NEUT% 63.7  38.4 - 76.8 %    LYMPH% 21.1  14.0 - 49.7 %    MONO% 12.0  0.0 - 14.0 %    EOS% 2.6  0.0 - 7.0 %    BASO% 0.6  0.0 - 2.0 %   COMPREHENSIVE METABOLIC PANEL (0000000)     Status: Abnormal   Collection Time   05/07/12 10:43 AM      Component Value Range Comment   Sodium 140  136 - 145 mEq/L    Potassium 4.0  3.5 - 5.1 mEq/L    Chloride 109 (*) 98 - 107 mEq/L    CO2 22  22 - 29 mEq/L    Glucose 135 (*) 70 - 99 mg/dl    BUN 37.0 (*) 7.0 - 26.0 mg/dL    Creatinine 2.0 (*) 0.6 - 1.1 mg/dL    Total Bilirubin 0.36  0.20 - 1.20 mg/dL    Alkaline Phosphatase 91  40 -  150 U/L    AST 22  5 - 34 U/L    ALT 13  0 - 55 U/L    Total Protein 7.8  6.4 - 8.3 g/dL    Albumin 3.9  3.5 - 5.0 g/dL    Calcium 9.9  8.4 - 10.4 mg/dL   LACTATE DEHYDROGENASE (CC13)     Status: Abnormal   Collection Time   05/07/12 10:43 AM      Component Value Range Comment   LDH 268 (*) 125 - 245 U/L      Imaging Studies: 1. CT scan of the abdomen and pelvis with IV contrast on 05/15/2010 showed rectal fecal impaction without proximal bowel obstruction.  There was a small amount of adjacent free fluid in the pelvis. There is a calcified appendicolith without acute appendicitis. There was an umbilical hernia containing fat, as well as a non-incarcerated small bowel loop. The CT scan was obtained when the patient apparently went to the emergency room.  2. MRI of the lumbar spine without contrast on 06/16/2010 showed moderate spinal stenosis at L4-5 unchanged. There was moderate  spinal stenosis at L5-S1 with progression of slip at L5 on S1 with advanced facet degeneration. There was interval development of  left foraminal and left lateral disk protrusion at L5-S1 compared with the prior MRI carried out on 06/14/2007.  3. Digital screening mammograms on 02/08/2011 and 02/20/2011 showed no significant abnormalities. Special views of the left breast were carried out as part of that exam. 4.  Digital screening mammogram on 02/11/2012 showed no mammographic evidence of malignancy.   Impression/Plan: Lindsey Mcguire will receive Aranesp today 200 mcg subcutaneously for a hemoglobin of 10.6. Will continue to check CBCs every 2 months and administer Aranesp 200 mcg subcu whenever the hemoglobin is less than 11.  We will repeat the LDH in 1 month as the value is elevated today at 268.  We will plan to see Lindsey Mcguire again in 6 months at which time we will check CBC, chemistries, and iron studies. Lindsey Mcguire is encouraged to contact us in the interim if she has any questions or concerns.    Ailene Ards, NP-C 05/07/2012, 12:42 PM

## 2012-06-04 ENCOUNTER — Other Ambulatory Visit: Payer: Medicare Other

## 2012-07-02 ENCOUNTER — Ambulatory Visit: Payer: Medicare Other

## 2012-07-02 ENCOUNTER — Other Ambulatory Visit (HOSPITAL_BASED_OUTPATIENT_CLINIC_OR_DEPARTMENT_OTHER): Payer: Medicare Other | Admitting: Lab

## 2012-07-02 ENCOUNTER — Other Ambulatory Visit: Payer: Self-pay | Admitting: Oncology

## 2012-07-02 DIAGNOSIS — D631 Anemia in chronic kidney disease: Secondary | ICD-10-CM

## 2012-07-02 DIAGNOSIS — D649 Anemia, unspecified: Secondary | ICD-10-CM

## 2012-07-02 DIAGNOSIS — N189 Chronic kidney disease, unspecified: Secondary | ICD-10-CM

## 2012-07-02 LAB — CBC WITH DIFFERENTIAL/PLATELET
EOS%: 1.8 % (ref 0.0–7.0)
MCH: 27.2 pg (ref 25.1–34.0)
MCV: 82.4 fL (ref 79.5–101.0)
MONO%: 20.4 % — ABNORMAL HIGH (ref 0.0–14.0)
NEUT#: 2.5 10*3/uL (ref 1.5–6.5)
RBC: 4.47 10*6/uL (ref 3.70–5.45)
RDW: 14.1 % (ref 11.2–14.5)

## 2012-07-02 LAB — LACTATE DEHYDROGENASE (CC13): LDH: 263 U/L — ABNORMAL HIGH (ref 125–245)

## 2012-07-02 MED ORDER — DARBEPOETIN ALFA-POLYSORBATE 200 MCG/0.4ML IJ SOLN: 200.0000 ug | Freq: Once | INTRAMUSCULAR | Status: DC

## 2012-09-03 ENCOUNTER — Ambulatory Visit (HOSPITAL_BASED_OUTPATIENT_CLINIC_OR_DEPARTMENT_OTHER): Payer: Medicare Other

## 2012-09-03 ENCOUNTER — Other Ambulatory Visit (HOSPITAL_BASED_OUTPATIENT_CLINIC_OR_DEPARTMENT_OTHER): Payer: Medicare Other | Admitting: Lab

## 2012-09-03 VITALS — BP 98/52 | HR 79 | Temp 97.9°F

## 2012-09-03 DIAGNOSIS — N189 Chronic kidney disease, unspecified: Secondary | ICD-10-CM

## 2012-09-03 DIAGNOSIS — D631 Anemia in chronic kidney disease: Secondary | ICD-10-CM

## 2012-09-03 DIAGNOSIS — D649 Anemia, unspecified: Secondary | ICD-10-CM

## 2012-09-03 DIAGNOSIS — N039 Chronic nephritic syndrome with unspecified morphologic changes: Secondary | ICD-10-CM

## 2012-09-03 LAB — CBC WITH DIFFERENTIAL/PLATELET
BASO%: 0.6 % (ref 0.0–2.0)
EOS%: 1 % (ref 0.0–7.0)
Eosinophils Absolute: 0.1 10*3/uL (ref 0.0–0.5)
LYMPH%: 20.6 % (ref 14.0–49.7)
MCH: 26.6 pg (ref 25.1–34.0)
MCHC: 32.5 g/dL (ref 31.5–36.0)
MCV: 82.1 fL (ref 79.5–101.0)
MONO%: 13.6 % (ref 0.0–14.0)
NEUT#: 3.4 10*3/uL (ref 1.5–6.5)
Platelets: 151 10*3/uL (ref 145–400)
RBC: 3.79 10*6/uL (ref 3.70–5.45)
RDW: 14.5 % (ref 11.2–14.5)

## 2012-09-03 MED ORDER — DARBEPOETIN ALFA-POLYSORBATE 200 MCG/0.4ML IJ SOLN
200.0000 ug | Freq: Once | INTRAMUSCULAR | Status: AC
  Administered 2012-09-03: 200 ug via SUBCUTANEOUS
  Filled 2012-09-03: qty 0.4

## 2012-11-05 ENCOUNTER — Ambulatory Visit (HOSPITAL_BASED_OUTPATIENT_CLINIC_OR_DEPARTMENT_OTHER): Payer: Medicare Other | Admitting: Hematology and Oncology

## 2012-11-05 ENCOUNTER — Telehealth: Payer: Self-pay | Admitting: Hematology and Oncology

## 2012-11-05 ENCOUNTER — Other Ambulatory Visit: Payer: Self-pay | Admitting: Medical Oncology

## 2012-11-05 ENCOUNTER — Other Ambulatory Visit (HOSPITAL_BASED_OUTPATIENT_CLINIC_OR_DEPARTMENT_OTHER): Payer: Medicare Other | Admitting: Lab

## 2012-11-05 ENCOUNTER — Ambulatory Visit (HOSPITAL_BASED_OUTPATIENT_CLINIC_OR_DEPARTMENT_OTHER): Payer: Medicare Other

## 2012-11-05 VITALS — BP 128/60 | HR 79 | Temp 98.1°F | Resp 18 | Ht 61.0 in | Wt 196.2 lb

## 2012-11-05 DIAGNOSIS — D631 Anemia in chronic kidney disease: Secondary | ICD-10-CM

## 2012-11-05 DIAGNOSIS — D649 Anemia, unspecified: Secondary | ICD-10-CM

## 2012-11-05 DIAGNOSIS — N189 Chronic kidney disease, unspecified: Secondary | ICD-10-CM

## 2012-11-05 DIAGNOSIS — N181 Chronic kidney disease, stage 1: Secondary | ICD-10-CM

## 2012-11-05 DIAGNOSIS — N289 Disorder of kidney and ureter, unspecified: Secondary | ICD-10-CM

## 2012-11-05 LAB — IRON AND TIBC CHCC
%SAT: 33 % (ref 21–57)
Iron: 96 ug/dL (ref 41–142)
TIBC: 289 ug/dL (ref 236–444)
UIBC: 193 ug/dL (ref 120–384)

## 2012-11-05 LAB — CBC WITH DIFFERENTIAL/PLATELET
BASO%: 0.8 % (ref 0.0–2.0)
Eosinophils Absolute: 0.1 10*3/uL (ref 0.0–0.5)
LYMPH%: 21.7 % (ref 14.0–49.7)
MCHC: 33.2 g/dL (ref 31.5–36.0)
MONO#: 0.5 10*3/uL (ref 0.1–0.9)
NEUT#: 3 10*3/uL (ref 1.5–6.5)
RBC: 3.87 10*6/uL (ref 3.70–5.45)
RDW: 14 % (ref 11.2–14.5)
WBC: 4.6 10*3/uL (ref 3.9–10.3)
lymph#: 1 10*3/uL (ref 0.9–3.3)

## 2012-11-05 LAB — COMPREHENSIVE METABOLIC PANEL (CC13)
BUN: 38.6 mg/dL — ABNORMAL HIGH (ref 7.0–26.0)
CO2: 22 mEq/L (ref 22–29)
Calcium: 9.6 mg/dL (ref 8.4–10.4)
Chloride: 106 mEq/L (ref 98–109)
Creatinine: 2.2 mg/dL — ABNORMAL HIGH (ref 0.6–1.1)
Glucose: 108 mg/dl (ref 70–140)
Total Bilirubin: 0.23 mg/dL (ref 0.20–1.20)

## 2012-11-05 LAB — FERRITIN CHCC: Ferritin: 223 ng/ml (ref 9–269)

## 2012-11-05 MED ORDER — DARBEPOETIN ALFA-POLYSORBATE 200 MCG/0.4ML IJ SOLN
200.0000 ug | Freq: Once | INTRAMUSCULAR | Status: AC
  Administered 2012-11-05: 200 ug via SUBCUTANEOUS
  Filled 2012-11-05: qty 0.4

## 2012-11-05 NOTE — Progress Notes (Signed)
Patient ID: Lindsey Mcguire, female   DOB: 08-31-1934, 78 y.o.   MRN: [id number] CSN: [id number]  CC: Nehemiah Settle, MD Darrold Span. Florene Glen, MD  Problem List: Lindsey Mcguire is a 78 y.o. African-American female with a problem list consisting of:  1. Anemia secondary to renal insufficiency. Currently on Aranesp injections as needed for hemoglobin less than or equal to 11.  2. Renal insufficiency.  3. Spinal stenosis at L4-5 and L5-6 as per MRI of the lumbar spine on 06/16/2010.  4. Hypertension.  5. Diabetes mellitus type 2.  6. Systolic ejection murmur.  7. Status post cervical spine surgery by Dr. Hazle Coca in October 2010. 8. Last mammogram 02/11/2012 9. Last colonoscopy in 2008, next one due in 2018  Dr. Ralene Ok and I saw Lindsey Mcguire today for follow up of her anemia related to renal insufficiency. Lindsey Mcguire was last seen by [place] on 11/07/2011. Her condition has remained fairly stable. She does have some ongoing problems with pain in her back and left hip related to spinal stenosis. She occasionally uses a cane to ambulate.  Lindsey Mcguire received an Aranesp injection on  03/05/2012 when her hemoglobin was 10.9 and on 11/07/2011 when her hemoglobin was 9.9. She receives Aranesp injections when her hemoglobin is less than 11. The patient is without any major complaints today.  She states that she has occasional constipation related to oral iron intake which is resolved by taking Metamucil and drinking prune juice.  Lindsey Mcguire denies any other symptomatology. The patient reports she was last seen by Dr. Erling Cruz for renal insufficiency on 01/21/2012.  Past Medical History: Past Medical History  Diagnosis Date  . Diabetes mellitus   . Hypertension   . Anemia 10/2003    Surgical History: Past Surgical History  Procedure Laterality Date  . Tubal ligation  1977  . Cataract extraction  12/06/2003    left eye    Current Medications: Current Outpatient Prescriptions  Medication  Sig Dispense Refill  . amLODipine-atorvastatin (CADUET) 10-40 MG per tablet Take 1 tablet by mouth daily.      Marland Kitchen aspirin 81 MG tablet Take 81 mg by mouth daily.       . Calcium Carb-Cholecalciferol [id number] MG-UNIT CAPS Take 2 tablets by mouth every other day.       . darbepoetin alfa-polysorbate (ARANESP, ALBUMIN FREE,) 200 MCG/ML injection Inject 1 mL (200 mcg total) into the skin once. Once every 2 months  0.5 mL  prn  . ferrous sulfate 325 (65 FE) MG tablet Take 325 mg by mouth daily with breakfast.      . glyBURIDE micronized (GLYNASE) 6 MG tablet Take 6 mg by mouth 2 (two) times daily with a meal.      . Multiple Vitamin (MULTIVITAMIN) tablet Take 1 tablet by mouth daily.      . nitroGLYCERIN (NITRODUR - DOSED IN MG/24 HR) 0.2 mg/hr Place 1 patch onto the skin daily.      . pioglitazone (ACTOS) 45 MG tablet Take 45 mg by mouth daily.      Vladimir Faster Glycol-Propyl Glycol (SYSTANE OP) Apply 2 drops to eye 4 (four) times daily as needed.      . vitamin E 400 UNIT capsule Take 400 Units by mouth 2 (two) times daily.       . valsartan (DIOVAN) 160 MG tablet Take 160 mg by mouth daily.        No current facility-administered medications for this visit.   Facility-Administered Medications  Ordered in Other Visits  Medication Dose Route Frequency Provider Last Rate Last Dose  . darbepoetin (ARANESP) injection 200 mcg  200 mcg Subcutaneous Once Jeanie Cooks, MD        Allergies: No Known Allergies  Family History: Family History  Problem Relation Age of Onset  . Diabetes Mother   . Hypertension Mother   . Cancer Brother   . Diabetes Brother   . Hypertension Maternal Grandmother   . Diabetes Maternal Grandmother   . Cancer Brother   . Diabetes Brother     Social History: History  Substance Use Topics  . Smoking status: Never Smoker   . Smokeless tobacco: Never Used  . Alcohol Use: No    Review of Systems: 10 Point review of systems was completed and is negative except  as noted above.   Physical Exam:   Blood pressure 128/60, pulse 79, temperature 98.1 F (36.7 C), temperature source Oral, resp. rate 18, height 5\' 1"  (1.549 m), weight 196 lb 3.2 oz (88.996 kg), SpO2 100.00%.  General appearance: Alert, cooperative, well nourished, no apparent distress Head: Normocephalic, without obvious abnormality, atraumatic Eyes: Conjunctivae clear, arcus senilis,  PERRLA, EOMI Nose: Nares, septum and mucosa are normal, no drainage or sinus tenderness Neck: No adenopathy, supple, symmetrical, trachea midline, thyroid not enlarged, no tenderness Resp: Clear to auscultation bilaterally, diminished LLL Cardio: Regular rate and rhythm, S1, S2 normal, 1/6 murmur, no click, rub or gallop GI: Soft, non-tender, distended, normoactive bowel sounds, no organomegaly Extremities: Extremities normal, atraumatic, no cyanosis or edema Lymph nodes: Cervical, supraclavicular, and axillary nodes normal Neurologic: Grossly normal   Laboratory Data:  CBC    Component Value Date/Time   WBC 4.6 11/05/2012 0958   WBC 11.1* 05/15/2010 1729   RBC 3.87 11/05/2012 0958   RBC 4.05 05/15/2010 1729   HGB 10.8* 11/05/2012 0958   HGB 11.0* 05/15/2010 1729   HCT 32.5* 11/05/2012 0958   HCT 32.0* 05/15/2010 1729   PLT 159 11/05/2012 0958   PLT 168 05/15/2010 1729   MCV 84.1 11/05/2012 0958   MCV 79.0 05/15/2010 1729   MCH 27.9 11/05/2012 0958   MCH 26.6 05/21/2010 1524   MCHC 33.2 11/05/2012 0958   MCHC 34.4 05/15/2010 1729   RDW 14.0 11/05/2012 0958   RDW 13.6 05/15/2010 1729   LYMPHSABS 1.0 11/05/2012 0958   LYMPHSABS 1.1 05/15/2010 1729   MONOABS 0.5 11/05/2012 0958   MONOABS 1.2* 05/15/2010 1729   EOSABS 0.1 11/05/2012 0958   EOSABS 0.0 05/15/2010 1729   BASOSABS 0.0 11/05/2012 0958   BASOSABS 0.0 05/15/2010 1729    Lab Results  Component Value Date   GLUCOSE 108 11/05/2012   BUN 38.6* 11/05/2012   CO2 22 11/05/2012   ALT 24 11/05/2012   AST 26 11/05/2012   LDH 263* 07/02/2012   K 3.9 11/05/2012    CREATININE 2.2* 11/05/2012    Imaging Studies: 1. CT scan of the abdomen and pelvis with IV contrast on 05/15/2010 showed rectal fecal impaction without proximal bowel obstruction.  There was a small amount of adjacent free fluid in the pelvis. There is a calcified appendicolith without acute appendicitis. There was an umbilical hernia containing fat, as well as a non-incarcerated small bowel loop. The CT scan was obtained when the patient apparently went to the emergency room.  2. MRI of the lumbar spine without contrast on 06/16/2010 showed moderate spinal stenosis at L4-5 unchanged. There was moderate  spinal stenosis at L5-S1 with progression of  slip at L5 on S1 with advanced facet degeneration. There was interval development of  left foraminal and left lateral disk protrusion at L5-S1 compared with the prior MRI carried out on 06/14/2007.  3. Digital screening mammograms on 02/08/2011 and 02/20/2011 showed no significant abnormalities. Special views of the left breast were carried out as part of that exam. 4.  Digital screening mammogram on 02/11/2012 showed no mammographic evidence of malignancy.   Impression/Plan: Mrs. Blaker will receive Aranesp today 200 mcg subcutaneously for a hemoglobin of 10.8. Will continue to check CBCs every 2 months and administer Aranesp 200 mcg subcu whenever the hemoglobin is less than 11.   We will plan to see Mrs. Filipe again in 6 months at which time we will check CBC, chemistries, and iron studies. Mrs. Bleyer is encouraged to contact us in the interim if she has any questions or concerns.    Layani Foronda E,  11/05/2012, 10:44 AM

## 2012-11-05 NOTE — Telephone Encounter (Signed)
Lab and injectrion q 2 weeks per Cira Rue

## 2012-11-05 NOTE — Telephone Encounter (Signed)
Gave pt appt for lab and injection every 2 months and MD in January 2015

## 2013-01-06 ENCOUNTER — Other Ambulatory Visit (HOSPITAL_BASED_OUTPATIENT_CLINIC_OR_DEPARTMENT_OTHER): Payer: Medicare Other | Admitting: Lab

## 2013-01-06 ENCOUNTER — Ambulatory Visit: Payer: Medicare Other

## 2013-01-06 DIAGNOSIS — D631 Anemia in chronic kidney disease: Secondary | ICD-10-CM

## 2013-01-06 DIAGNOSIS — N189 Chronic kidney disease, unspecified: Secondary | ICD-10-CM

## 2013-01-06 LAB — CBC WITH DIFFERENTIAL/PLATELET
BASO%: 0.8 % (ref 0.0–2.0)
Basophils Absolute: 0 10*3/uL (ref 0.0–0.1)
EOS%: 1.4 % (ref 0.0–7.0)
HGB: 12 g/dL (ref 11.6–15.9)
MCH: 26.6 pg (ref 25.1–34.0)
MCHC: 32.3 g/dL (ref 31.5–36.0)
MCV: 82.3 fL (ref 79.5–101.0)
MONO%: 12.5 % (ref 0.0–14.0)
RDW: 13.9 % (ref 11.2–14.5)

## 2013-01-06 MED ORDER — DARBEPOETIN ALFA-POLYSORBATE 200 MCG/0.4ML IJ SOLN: 200.0000 ug | Freq: Once | INTRAMUSCULAR | Status: DC

## 2013-01-14 ENCOUNTER — Other Ambulatory Visit (HOSPITAL_COMMUNITY): Payer: Self-pay | Admitting: Internal Medicine

## 2013-01-14 DIAGNOSIS — Z139 Encounter for screening, unspecified: Secondary | ICD-10-CM

## 2013-02-15 ENCOUNTER — Ambulatory Visit (HOSPITAL_COMMUNITY)
Admission: RE | Admit: 2013-02-15 | Discharge: 2013-02-15 | Disposition: A | Discharge status: Home / Self Care | Payer: Medicare Other | Source: Ambulatory Visit | Attending: Internal Medicine | Admitting: Internal Medicine

## 2013-02-15 DIAGNOSIS — Z139 Encounter for screening, unspecified: Secondary | ICD-10-CM

## 2013-02-15 DIAGNOSIS — Z1231 Encounter for screening mammogram for malignant neoplasm of breast: Secondary | ICD-10-CM | POA: Insufficient documentation

## 2013-02-22 ENCOUNTER — Other Ambulatory Visit: Payer: Self-pay | Admitting: Internal Medicine

## 2013-02-22 DIAGNOSIS — R928 Other abnormal and inconclusive findings on diagnostic imaging of breast: Secondary | ICD-10-CM

## 2013-03-03 ENCOUNTER — Ambulatory Visit (HOSPITAL_COMMUNITY)
Admission: RE | Admit: 2013-03-03 | Discharge: 2013-03-03 | Disposition: A | Discharge status: Home / Self Care | Payer: Medicare Other | Source: Ambulatory Visit | Attending: Internal Medicine | Admitting: Internal Medicine

## 2013-03-03 DIAGNOSIS — R928 Other abnormal and inconclusive findings on diagnostic imaging of breast: Secondary | ICD-10-CM | POA: Insufficient documentation

## 2013-03-10 ENCOUNTER — Other Ambulatory Visit (HOSPITAL_BASED_OUTPATIENT_CLINIC_OR_DEPARTMENT_OTHER): Payer: Medicare Other | Admitting: Lab

## 2013-03-10 ENCOUNTER — Ambulatory Visit (HOSPITAL_BASED_OUTPATIENT_CLINIC_OR_DEPARTMENT_OTHER): Payer: Medicare Other

## 2013-03-10 VITALS — BP 122/58 | HR 83 | Temp 98.5°F

## 2013-03-10 DIAGNOSIS — D631 Anemia in chronic kidney disease: Secondary | ICD-10-CM

## 2013-03-10 DIAGNOSIS — N189 Chronic kidney disease, unspecified: Secondary | ICD-10-CM

## 2013-03-10 LAB — CBC WITH DIFFERENTIAL/PLATELET
EOS%: 1.6 % (ref 0.0–7.0)
Eosinophils Absolute: 0.1 10*3/uL (ref 0.0–0.5)
LYMPH%: 24.3 % (ref 14.0–49.7)
MCH: 26.3 pg (ref 25.1–34.0)
MCHC: 31.9 g/dL (ref 31.5–36.0)
MCV: 82.5 fL (ref 79.5–101.0)
MONO%: 14 % (ref 0.0–14.0)
Platelets: 175 10*3/uL (ref 145–400)
RBC: 3.95 10*6/uL (ref 3.70–5.45)
RDW: 15 % — ABNORMAL HIGH (ref 11.2–14.5)

## 2013-03-10 MED ORDER — DARBEPOETIN ALFA-POLYSORBATE 200 MCG/0.4ML IJ SOLN
200.0000 ug | Freq: Once | INTRAMUSCULAR | Status: AC
  Administered 2013-03-10: 200 ug via SUBCUTANEOUS
  Filled 2013-03-10: qty 0.4

## 2013-05-07 ENCOUNTER — Other Ambulatory Visit: Payer: Medicare Other | Admitting: Lab

## 2013-05-07 ENCOUNTER — Ambulatory Visit: Payer: Medicare Other

## 2013-05-12 ENCOUNTER — Encounter (INDEPENDENT_AMBULATORY_CARE_PROVIDER_SITE_OTHER): Payer: Self-pay

## 2013-05-12 ENCOUNTER — Telehealth: Payer: Self-pay | Admitting: Internal Medicine

## 2013-05-12 ENCOUNTER — Other Ambulatory Visit: Payer: Self-pay | Admitting: Internal Medicine

## 2013-05-12 ENCOUNTER — Other Ambulatory Visit: Payer: Medicare Other | Admitting: Lab

## 2013-05-12 ENCOUNTER — Ambulatory Visit (HOSPITAL_BASED_OUTPATIENT_CLINIC_OR_DEPARTMENT_OTHER): Payer: Medicare Other | Admitting: Internal Medicine

## 2013-05-12 ENCOUNTER — Other Ambulatory Visit (HOSPITAL_BASED_OUTPATIENT_CLINIC_OR_DEPARTMENT_OTHER): Payer: Medicare Other

## 2013-05-12 ENCOUNTER — Ambulatory Visit: Payer: Medicare Other

## 2013-05-12 VITALS — BP 106/58 | HR 92 | Temp 98.4°F | Resp 20 | Ht 61.0 in | Wt 193.6 lb

## 2013-05-12 DIAGNOSIS — D631 Anemia in chronic kidney disease: Secondary | ICD-10-CM

## 2013-05-12 DIAGNOSIS — N039 Chronic nephritic syndrome with unspecified morphologic changes: Secondary | ICD-10-CM

## 2013-05-12 DIAGNOSIS — N189 Chronic kidney disease, unspecified: Secondary | ICD-10-CM

## 2013-05-12 DIAGNOSIS — I10 Essential (primary) hypertension: Secondary | ICD-10-CM

## 2013-05-12 DIAGNOSIS — E119 Type 2 diabetes mellitus without complications: Secondary | ICD-10-CM

## 2013-05-12 LAB — CBC WITH DIFFERENTIAL/PLATELET
BASO%: 0.9 % (ref 0.0–2.0)
Basophils Absolute: 0 10*3/uL (ref 0.0–0.1)
EOS ABS: 0.1 10*3/uL (ref 0.0–0.5)
EOS%: 1.8 % (ref 0.0–7.0)
HCT: 36.1 % (ref 34.8–46.6)
HGB: 11.7 g/dL (ref 11.6–15.9)
LYMPH%: 22.8 % (ref 14.0–49.7)
MCH: 27.4 pg (ref 25.1–34.0)
MCHC: 32.3 g/dL (ref 31.5–36.0)
MCV: 84.6 fL (ref 79.5–101.0)
MONO#: 0.6 10*3/uL (ref 0.1–0.9)
MONO%: 11.1 % (ref 0.0–14.0)
NEUT#: 3.2 10*3/uL (ref 1.5–6.5)
NEUT%: 63.4 % (ref 38.4–76.8)
Platelets: 180 10*3/uL (ref 145–400)
RBC: 4.26 10*6/uL (ref 3.70–5.45)
RDW: 14.1 % (ref 11.2–14.5)
WBC: 5.1 10*3/uL (ref 3.9–10.3)
lymph#: 1.2 10*3/uL (ref 0.9–3.3)

## 2013-05-12 LAB — IRON AND TIBC CHCC
%SAT: 43 % (ref 21–57)
Iron: 115 ug/dL (ref 41–142)
TIBC: 270 ug/dL (ref 236–444)
UIBC: 155 ug/dL (ref 120–384)

## 2013-05-12 LAB — COMPREHENSIVE METABOLIC PANEL (CC13)
ALBUMIN: 3.8 g/dL (ref 3.5–5.0)
ALK PHOS: 102 U/L (ref 40–150)
ALT: 19 U/L (ref 0–55)
AST: 20 U/L (ref 5–34)
Anion Gap: 12 mEq/L — ABNORMAL HIGH (ref 3–11)
BUN: 35.9 mg/dL — ABNORMAL HIGH (ref 7.0–26.0)
CO2: 22 mEq/L (ref 22–29)
Calcium: 9.6 mg/dL (ref 8.4–10.4)
Chloride: 108 mEq/L (ref 98–109)
Creatinine: 2 mg/dL — ABNORMAL HIGH (ref 0.6–1.1)
GLUCOSE: 123 mg/dL (ref 70–140)
Potassium: 3.8 mEq/L (ref 3.5–5.1)
SODIUM: 142 meq/L (ref 136–145)
TOTAL PROTEIN: 7.8 g/dL (ref 6.4–8.3)
Total Bilirubin: 0.35 mg/dL (ref 0.20–1.20)

## 2013-05-12 LAB — LACTATE DEHYDROGENASE (CC13): LDH: 241 U/L (ref 125–245)

## 2013-05-12 LAB — FERRITIN CHCC: Ferritin: 264 ng/ml (ref 9–269)

## 2013-05-12 MED ORDER — DARBEPOETIN ALFA-POLYSORBATE 200 MCG/0.4ML IJ SOLN: 200.0000 ug | Freq: Once | INTRAMUSCULAR | Status: DC

## 2013-05-12 NOTE — Patient Instructions (Signed)
Darbepoetin Alfa injection What is this medicine? DARBEPOETIN ALFA (dar be POE e tin AL fa) helps your body make more red blood cells. It is used to treat anemia caused by chronic kidney failure and chemotherapy. This medicine may be used for other purposes; ask your health care provider or pharmacist if you have questions. COMMON BRAND NAME(S): Aranesp What should I tell my health care provider before I take this medicine? They need to know if you have any of these conditions: -blood clotting disorders or history of blood clots -cancer patient not on chemotherapy -cystic fibrosis -heart disease, such as angina, heart failure, or a history of a heart attack -hemoglobin level of 12 g/dL or greater -high blood pressure -low levels of folate, iron, or vitamin B12 -seizures -an unusual or allergic reaction to darbepoetin, erythropoietin, albumin, hamster proteins, latex, other medicines, foods, dyes, or preservatives -pregnant or trying to get pregnant -breast-feeding How should I use this medicine? This medicine is for injection into a vein or under the skin. It is usually given by a health care professional in a hospital or clinic setting. If you get this medicine at home, you will be taught how to prepare and give this medicine. Do not shake the solution before you withdraw a dose. Use exactly as directed. Take your medicine at regular intervals. Do not take your medicine more often than directed. It is important that you put your used needles and syringes in a special sharps container. Do not put them in a trash can. If you do not have a sharps container, call your pharmacist or healthcare provider to get one. Talk to your pediatrician regarding the use of this medicine in children. While this medicine may be used in children as young as 1 year for selected conditions, precautions do apply. Overdosage: If you think you have taken too much of this medicine contact a poison control center or  emergency room at once. NOTE: This medicine is only for you. Do not share this medicine with others. What if I miss a dose? If you miss a dose, take it as soon as you can. If it is almost time for your next dose, take only that dose. Do not take double or extra doses. What may interact with this medicine? Do not take this medicine with any of the following medications: -epoetin alfa This list may not describe all possible interactions. Give your health care provider a list of all the medicines, herbs, non-prescription drugs, or dietary supplements you use. Also tell them if you smoke, drink alcohol, or use illegal drugs. Some items may interact with your medicine. What should I watch for while using this medicine? Visit your prescriber or health care professional for regular checks on your progress and for the needed blood tests and blood pressure measurements. It is especially important for the doctor to make sure your hemoglobin level is in the desired range, to limit the risk of potential side effects and to give you the best benefit. Keep all appointments for any recommended tests. Check your blood pressure as directed. Ask your doctor what your blood pressure should be and when you should contact him or her. As your body makes more red blood cells, you may need to take iron, folic acid, or vitamin B supplements. Ask your doctor or health care provider which products are right for you. If you have kidney disease continue dietary restrictions, even though this medication can make you feel better. Talk with your doctor or health   care professional about the foods you eat and the vitamins that you take. What side effects may I notice from receiving this medicine? Side effects that you should report to your doctor or health care professional as soon as possible: -allergic reactions like skin rash, itching or hives, swelling of the face, lips, or tongue -breathing problems -changes in vision -chest  pain -confusion, trouble speaking or understanding -feeling faint or lightheaded, falls -high blood pressure -muscle aches or pains -pain, swelling, warmth in the leg -rapid weight gain -severe headaches -sudden numbness or weakness of the face, arm or leg -trouble walking, dizziness, loss of balance or coordination -seizures (convulsions) -swelling of the ankles, feet, hands -unusually weak or tired Side effects that usually do not require medical attention (report to your doctor or health care professional if they continue or are bothersome): -diarrhea -fever, chills (flu-like symptoms) -headaches -nausea, vomiting -redness, stinging, or swelling at site where injected This list may not describe all possible side effects. Call your doctor for medical advice about side effects. You may report side effects to FDA at 1-800-FDA-1088. Where should I keep my medicine? Keep out of the reach of children. Store in a refrigerator between 2 and 8 degrees C (36 and 46 degrees F). Do not freeze. Do not shake. Throw away any unused portion if using a single-dose vial. Throw away any unused medicine after the expiration date. NOTE: This sheet is a summary. It may not cover all possible information. If you have questions about this medicine, talk to your doctor, pharmacist, or health care provider.  2014, [employer]vier/Gold Standard. (2008-03-29 10:23:57)  

## 2013-05-12 NOTE — Progress Notes (Signed)
[facility] [place] NOTE  Horton Finer, MD [address], [address] [place] [address]  DIAGNOSIS: Anemia due to chronic renal failure treated with erythropoietin  Chief Complaint  Patient presents with  . Anemia due to chronic renal failure treated with erythropoie    CURRENT THERAPY:  INTERVAL HISTORY: Lindsey Mcguire 79 y.o. female with a problem list consisting of:   1. Anemia secondary to renal insufficiency. Currently on Aranesp injections as needed for hemoglobin less than or equal to 11.  2. Renal insufficiency.  3. Spinal stenosis at L4-5 and L5-6 as per MRI of the lumbar spine on 06/16/2010.  4. Hypertension.  5. Diabetes mellitus type 2.  6. Systolic ejection murmur.  7. Status post cervical spine surgery by Dr. Hazle Coca in October 2010.  8. Last mammogram 02/11/2012  9. Last colonoscopy in 2008, next one due in 2018   d I saw Lindsey Mcguire today for follow up of her anemia related to renal insufficiency. Lindsey Mcguire was last seen by [place] on 11/05/2012. Her condition has remains fairly stable. She reports occasional shortness of breath with exertion.  She occasionally uses a cane to ambulate. She has left back pain with radiation due to spinal stenosis as noted above.  Lindsey Mcguire received an Aranesp injection on 03/10/2013 when her hemoglobin was 10.4 and on 11/05/2012 when her hemoglobin was 10.8. She receives Aranesp injections when her hemoglobin is less than 11. The patient is without any major complaints today. She states that she has occasional constipation related to oral iron intake which is resolved by taking Metamucil and drinking prune juice.    MEDICAL HISTORY: Past Medical History  Diagnosis Date  . Diabetes mellitus   . Hypertension   . Anemia 10/2003    INTERIM HISTORY: has Anemia due to chronic renal failure treated with erythropoietin on her problem list.    ALLERGIES:  has No Known  Allergies.  MEDICATIONS: has a current medication list which includes the following prescription(s): amlodipine-atorvastatin, aspirin, calcium carb-cholecalciferol, darbepoetin alfa-polysorbate, ferrous sulfate, glyburide micronized, multivitamin, nitroglycerin, pioglitazone, polyethyl glycol-propyl glycol, polyethylene glycol 3350, valsartan, and vitamin e.  SURGICAL HISTORY:  Past Surgical History  Procedure Laterality Date  . Tubal ligation  1977  . Cataract extraction  12/06/2003    left eye    REVIEW OF SYSTEMS:   Constitutional: Denies fevers, chills or abnormal weight loss Eyes: Denies blurriness of vision Ears, nose, mouth, throat, and face: Denies mucositis or sore throat Respiratory: Denies cough, dyspnea or wheezes Cardiovascular: Denies palpitation, chest discomfort or lower extremity swelling Gastrointestinal:  Denies nausea, heartburn or change in bowel habits Skin: Denies abnormal skin rashes Lymphatics: Denies new lymphadenopathy or easy bruising Neurological:Denies numbness, tingling or new weaknesses Behavioral/Psych: Mood is stable, no new changes  All other systems were reviewed with the patient and are negative.  PHYSICAL EXAMINATION: ECOG PERFORMANCE STATUS: 0 - Asymptomatic  Blood pressure 106/58, pulse 92, temperature 98.4 F (36.9 C), temperature source Oral, resp. rate 20, height 5\' 1"  (1.549 m), weight 193 lb 9.6 oz (87.816 kg), SpO2 100.00%.  GENERAL:alert, no distress and comfortable, elderly female SKIN: skin color, texture, turgor are normal, no rashes or significant lesions; multiple moles on neck and face EYES: normal, Conjunctiva are pink and non-injected, sclera clear OROPHARYNX:no exudate, no erythema and lips, buccal mucosa, and tongue normal  NECK: supple, thyroid normal size, non-tender, without nodularity LYMPH:  no palpable lymphadenopathy in the cervical, axillary or supraclavicular LUNGS: clear  to auscultation and percussion with normal  breathing effort HEART: regular rate & rhythm and 1/6 SEM and no lower extremity edema ABDOMEN:abdomen soft, non-tender and normal bowel sounds Musculoskeletal:no cyanosis of digits and no clubbing  NEURO: alert & oriented x 3 with fluent speech, no focal motor/sensory deficits   LABORATORY DATA: Results for orders placed in visit on 05/12/13 (from the past 48 hour(s))  CBC WITH DIFFERENTIAL     Status: None   Collection Time    05/12/13  9:17 AM      Result Value Range   WBC 5.1  3.9 - 10.3 10e3/uL   NEUT# 3.2  1.5 - 6.5 10e3/uL   HGB 11.7  11.6 - 15.9 g/dL   HCT 36.1  34.8 - 46.6 %   Platelets 180  145 - 400 10e3/uL   MCV 84.6  79.5 - 101.0 fL   MCH 27.4  25.1 - 34.0 pg   MCHC 32.3  31.5 - 36.0 g/dL   RBC 4.26  3.70 - 5.45 10e6/uL   RDW 14.1  11.2 - 14.5 %   lymph# 1.2  0.9 - 3.3 10e3/uL   MONO# 0.6  0.1 - 0.9 10e3/uL   Eosinophils Absolute 0.1  0.0 - 0.5 10e3/uL   Basophils Absolute 0.0  0.0 - 0.1 10e3/uL   NEUT% 63.4  38.4 - 76.8 %   LYMPH% 22.8  14.0 - 49.7 %   MONO% 11.1  0.0 - 14.0 %   EOS% 1.8  0.0 - 7.0 %   BASO% 0.9  0.0 - 2.0 %  FERRITIN [facility]     Status: None   Collection Time    05/12/13  9:17 AM      Result Value Range   Ferritin 264  9 - 269 ng/ml  IRON AND TIBC [facility]     Status: None   Collection Time    05/12/13  9:17 AM      Result Value Range   Iron 115  41 - 142 ug/dL   TIBC 270  236 - 444 ug/dL   UIBC 155  120 - 384 ug/dL   %SAT 43  21 - 57 %  LACTATE DEHYDROGENASE (CC13)     Status: None   Collection Time    05/12/13  9:17 AM      Result Value Range   LDH 241  125 - 245 U/L  COMPREHENSIVE METABOLIC PANEL (0000000)     Status: Abnormal   Collection Time    05/12/13  9:17 AM      Result Value Range   Sodium 142  136 - 145 mEq/L   Potassium 3.8  3.5 - 5.1 mEq/L   Chloride 108  98 - 109 mEq/L   CO2 22  22 - 29 mEq/L   Glucose 123  70 - 140 mg/dl   BUN 35.9 (*) 7.0 - 26.0 mg/dL   Creatinine 2.0 (*) 0.6 - 1.1 mg/dL   Total Bilirubin 0.35  0.20 -  1.20 mg/dL   Alkaline Phosphatase 102  40 - 150 U/L   AST 20  5 - 34 U/L   ALT 19  0 - 55 U/L   Total Protein 7.8  6.4 - 8.3 g/dL   Albumin 3.8  3.5 - 5.0 g/dL   Calcium 9.6  8.4 - 10.4 mg/dL   Anion Gap 12 (*) 3 - 11 mEq/L       Labs:  Lab Results  Component Value Date   WBC 5.1 05/12/2013  HGB 11.7 05/12/2013   HCT 36.1 05/12/2013   MCV 84.6 05/12/2013   PLT 180 05/12/2013   NEUTROABS 3.2 05/12/2013      Chemistry      Component Value Date/Time   NA 142 05/12/2013 0917   NA 138 11/07/2011 1224   K 3.8 05/12/2013 0917   K 3.9 11/07/2011 1224   CL 109* 05/07/2012 1043   CL 104 11/07/2011 1224   CO2 22 05/12/2013 0917   CO2 22 11/07/2011 1224   BUN 35.9* 05/12/2013 0917   BUN 38* 11/07/2011 1224   CREATININE 2.0* 05/12/2013 0917   CREATININE 2.06* 11/07/2011 1224      Component Value Date/Time   CALCIUM 9.6 05/12/2013 0917   CALCIUM 9.6 11/07/2011 1224   ALKPHOS 102 05/12/2013 0917   ALKPHOS 73 11/07/2011 1224   AST 20 05/12/2013 0917   AST 24 11/07/2011 1224   ALT 19 05/12/2013 0917   ALT 19 11/07/2011 1224   BILITOT 0.35 05/12/2013 0917   BILITOT 0.5 11/07/2011 1224     Basic Metabolic Panel:  Recent Labs Lab 05/12/13 0917  NA 142  K 3.8  CO2 22  GLUCOSE 123  BUN 35.9*  CREATININE 2.0*  CALCIUM 9.6   GFR Estimated Creatinine Clearance: 24.1 ml/min (by C-G formula based on Cr of 2). Liver Function Tests:  Recent Labs Lab 05/12/13 0917  AST 20  ALT 19  ALKPHOS 102  BILITOT 0.35  PROT 7.8  ALBUMIN 3.8   CBC:  Recent Labs Lab 05/12/13 0917  WBC 5.1  NEUTROABS 3.2  HGB 11.7  HCT 36.1  MCV 84.6  PLT 180   Anemia work up  Recent Labs  05/12/13 0917  FERRITIN 264  TIBC 270  IRON 115   Microbiology No results found for this or any previous visit (from the past 240 hour(s)).  Studies:  No results found.   RADIOGRAPHIC STUDIES: 1. CT scan of the abdomen and pelvis with IV contrast on 05/15/2010 showed rectal fecal impaction without proximal bowel  obstruction.  There was a small amount of adjacent free fluid in the pelvis. There is a calcified appendicolith without acute appendicitis. There was an umbilical hernia containing fat, as well as a non-incarcerated small bowel loop. The CT scan was obtained when the patient apparently went to the emergency room.  2. MRI of the lumbar spine without contrast on 06/16/2010 showed moderate spinal stenosis at L4-5 unchanged. There was moderate  spinal stenosis at L5-S1 with progression of slip at L5 on S1 with advanced facet degeneration. There was interval development of  left foraminal and left lateral disk protrusion at L5-S1 compared with the prior MRI carried out on 06/14/2007.  3. Digital screening mammograms on 02/08/2011 and 02/20/2011 showed no significant abnormalities. Special views of the left breast were carried out as part of that exam.  4. Digital screening mammogram on 02/11/2012 showed no mammographic evidence of malignancy. 5. Digital screening mammogram of Left breast on 03/03/2013 showed no mammographic evidence of malignancy.  ASSESSMENT: NAKIMA PROBY 79 y.o. female with a history of Anemia due to chronic renal failure treated with erythropoietin   PLAN:   1. Anemia due to chronic renal failure treated with erythropoietin. --Lindsey Mcguire will not receive Aranesp today 200 mcg subcu for a hemoglobin of 11.7. Will continue to check CBCs every 2 months and administer Aranesp 200 mcg subcu whenever the hemoglobin is  less than or equal to 11.   2. Follow-up. --We will plan  to see Lindsey Mcguire again in 6 months at which time we will check CBC, chemistries, and iron studies.   All questions were answered. The patient knows to call the clinic with any problems, questions or concerns. We can certainly see the patient much sooner if necessary.  I spent 10 minutes counseling the patient face to face. The total time spent in the appointment was 15 minutes.    Julena Barbour,  MD 05/12/2013 10:53 AM

## 2013-05-12 NOTE — Telephone Encounter (Signed)
gv adn printed appt sched and avs for pt for March, May and july

## 2013-05-13 ENCOUNTER — Encounter: Payer: Self-pay | Admitting: Internal Medicine

## 2013-07-07 ENCOUNTER — Ambulatory Visit (HOSPITAL_BASED_OUTPATIENT_CLINIC_OR_DEPARTMENT_OTHER): Payer: Medicare Other

## 2013-07-07 ENCOUNTER — Other Ambulatory Visit (HOSPITAL_BASED_OUTPATIENT_CLINIC_OR_DEPARTMENT_OTHER): Payer: Medicare Other

## 2013-07-07 VITALS — BP 117/54 | HR 78 | Temp 98.3°F

## 2013-07-07 DIAGNOSIS — N189 Chronic kidney disease, unspecified: Secondary | ICD-10-CM

## 2013-07-07 DIAGNOSIS — N039 Chronic nephritic syndrome with unspecified morphologic changes: Secondary | ICD-10-CM

## 2013-07-07 DIAGNOSIS — D631 Anemia in chronic kidney disease: Secondary | ICD-10-CM

## 2013-07-07 LAB — CBC WITH DIFFERENTIAL/PLATELET
BASO%: 0.6 % (ref 0.0–2.0)
Basophils Absolute: 0 10*3/uL (ref 0.0–0.1)
EOS%: 1.6 % (ref 0.0–7.0)
Eosinophils Absolute: 0.1 10*3/uL (ref 0.0–0.5)
HEMATOCRIT: 33.7 % — AB (ref 34.8–46.6)
HEMOGLOBIN: 10.9 g/dL — AB (ref 11.6–15.9)
LYMPH%: 22 % (ref 14.0–49.7)
MCH: 26.8 pg (ref 25.1–34.0)
MCHC: 32.4 g/dL (ref 31.5–36.0)
MCV: 82.6 fL (ref 79.5–101.0)
MONO#: 0.6 10*3/uL (ref 0.1–0.9)
MONO%: 12.4 % (ref 0.0–14.0)
NEUT#: 3.3 10*3/uL (ref 1.5–6.5)
NEUT%: 63.4 % (ref 38.4–76.8)
PLATELETS: 168 10*3/uL (ref 145–400)
RBC: 4.09 10*6/uL (ref 3.70–5.45)
RDW: 13.7 % (ref 11.2–14.5)
WBC: 5.1 10*3/uL (ref 3.9–10.3)
lymph#: 1.1 10*3/uL (ref 0.9–3.3)

## 2013-07-07 MED ORDER — DARBEPOETIN ALFA-POLYSORBATE 200 MCG/0.4ML IJ SOLN
200.0000 ug | Freq: Once | INTRAMUSCULAR | Status: AC
  Administered 2013-07-07: 200 ug via SUBCUTANEOUS
  Filled 2013-07-07: qty 0.4

## 2013-09-01 ENCOUNTER — Ambulatory Visit: Payer: Medicare Other

## 2013-09-01 ENCOUNTER — Other Ambulatory Visit: Payer: Medicare Other

## 2013-09-01 ENCOUNTER — Telehealth: Payer: Self-pay | Admitting: *Deleted

## 2013-09-01 NOTE — Telephone Encounter (Signed)
Called patient about missed appointment.  States that she was on her way when they had car trouble.  Rescheduled appointment for 09/10/13

## 2013-09-09 ENCOUNTER — Other Ambulatory Visit: Payer: Medicare Other

## 2013-09-10 ENCOUNTER — Ambulatory Visit: Payer: Medicare Other

## 2013-09-10 ENCOUNTER — Other Ambulatory Visit (HOSPITAL_BASED_OUTPATIENT_CLINIC_OR_DEPARTMENT_OTHER): Payer: Medicare Other

## 2013-09-10 DIAGNOSIS — N189 Chronic kidney disease, unspecified: Secondary | ICD-10-CM

## 2013-09-10 DIAGNOSIS — N039 Chronic nephritic syndrome with unspecified morphologic changes: Secondary | ICD-10-CM

## 2013-09-10 DIAGNOSIS — D631 Anemia in chronic kidney disease: Secondary | ICD-10-CM

## 2013-09-10 LAB — CBC WITH DIFFERENTIAL/PLATELET
BASO%: 0.8 % (ref 0.0–2.0)
Basophils Absolute: 0 10*3/uL (ref 0.0–0.1)
EOS ABS: 0.1 10*3/uL (ref 0.0–0.5)
EOS%: 2.8 % (ref 0.0–7.0)
HCT: 34.6 % — ABNORMAL LOW (ref 34.8–46.6)
HGB: 11 g/dL — ABNORMAL LOW (ref 11.6–15.9)
LYMPH%: 18.7 % (ref 14.0–49.7)
MCH: 26.9 pg (ref 25.1–34.0)
MCHC: 31.7 g/dL (ref 31.5–36.0)
MCV: 84.8 fL (ref 79.5–101.0)
MONO#: 0.8 10*3/uL (ref 0.1–0.9)
MONO%: 15.9 % — ABNORMAL HIGH (ref 0.0–14.0)
NEUT%: 61.8 % (ref 38.4–76.8)
NEUTROS ABS: 3.1 10*3/uL (ref 1.5–6.5)
Platelets: 168 10*3/uL (ref 145–400)
RBC: 4.08 10*6/uL (ref 3.70–5.45)
RDW: 14 % (ref 11.2–14.5)
WBC: 4.9 10*3/uL (ref 3.9–10.3)
lymph#: 0.9 10*3/uL (ref 0.9–3.3)

## 2013-09-10 MED ORDER — DARBEPOETIN ALFA-POLYSORBATE 200 MCG/0.4ML IJ SOLN: 200.0000 ug | Freq: Once | INTRAMUSCULAR | Status: DC

## 2013-10-26 ENCOUNTER — Encounter (HOSPITAL_COMMUNITY): Payer: Self-pay | Admitting: Pharmacy Technician

## 2013-10-27 ENCOUNTER — Telehealth: Payer: Self-pay | Admitting: Internal Medicine

## 2013-10-27 ENCOUNTER — Ambulatory Visit (HOSPITAL_BASED_OUTPATIENT_CLINIC_OR_DEPARTMENT_OTHER): Payer: Medicare Other

## 2013-10-27 ENCOUNTER — Other Ambulatory Visit (HOSPITAL_BASED_OUTPATIENT_CLINIC_OR_DEPARTMENT_OTHER): Payer: Medicare Other

## 2013-10-27 ENCOUNTER — Encounter: Payer: Self-pay | Admitting: Internal Medicine

## 2013-10-27 ENCOUNTER — Ambulatory Visit (HOSPITAL_BASED_OUTPATIENT_CLINIC_OR_DEPARTMENT_OTHER): Payer: Medicare Other | Admitting: Internal Medicine

## 2013-10-27 VITALS — BP 148/88 | HR 75 | Temp 97.1°F | Resp 20 | Ht 61.0 in | Wt 191.5 lb

## 2013-10-27 DIAGNOSIS — N183 Chronic kidney disease, stage 3 unspecified: Secondary | ICD-10-CM

## 2013-10-27 DIAGNOSIS — D631 Anemia in chronic kidney disease: Secondary | ICD-10-CM

## 2013-10-27 DIAGNOSIS — N039 Chronic nephritic syndrome with unspecified morphologic changes: Secondary | ICD-10-CM

## 2013-10-27 DIAGNOSIS — N189 Chronic kidney disease, unspecified: Principal | ICD-10-CM

## 2013-10-27 LAB — COMPREHENSIVE METABOLIC PANEL (CC13)
ALBUMIN: 3.8 g/dL (ref 3.5–5.0)
ALK PHOS: 94 U/L (ref 40–150)
ALT: 20 U/L (ref 0–55)
AST: 23 U/L (ref 5–34)
Anion Gap: 10 mEq/L (ref 3–11)
BUN: 33.8 mg/dL — AB (ref 7.0–26.0)
CO2: 24 mEq/L (ref 22–29)
Calcium: 9.9 mg/dL (ref 8.4–10.4)
Chloride: 107 mEq/L (ref 98–109)
Creatinine: 2.1 mg/dL — ABNORMAL HIGH (ref 0.6–1.1)
Glucose: 106 mg/dl (ref 70–140)
POTASSIUM: 4.1 meq/L (ref 3.5–5.1)
SODIUM: 142 meq/L (ref 136–145)
Total Bilirubin: 0.32 mg/dL (ref 0.20–1.20)
Total Protein: 7.7 g/dL (ref 6.4–8.3)

## 2013-10-27 LAB — CBC WITH DIFFERENTIAL/PLATELET
BASO%: 0.7 % (ref 0.0–2.0)
Basophils Absolute: 0 10*3/uL (ref 0.0–0.1)
EOS ABS: 0.1 10*3/uL (ref 0.0–0.5)
EOS%: 2.2 % (ref 0.0–7.0)
HCT: 33.7 % — ABNORMAL LOW (ref 34.8–46.6)
HGB: 10.7 g/dL — ABNORMAL LOW (ref 11.6–15.9)
LYMPH%: 22.9 % (ref 14.0–49.7)
MCH: 26.6 pg (ref 25.1–34.0)
MCHC: 31.8 g/dL (ref 31.5–36.0)
MCV: 83.6 fL (ref 79.5–101.0)
MONO#: 0.6 10*3/uL (ref 0.1–0.9)
MONO%: 14.2 % — ABNORMAL HIGH (ref 0.0–14.0)
NEUT%: 60 % (ref 38.4–76.8)
NEUTROS ABS: 2.7 10*3/uL (ref 1.5–6.5)
PLATELETS: 171 10*3/uL (ref 145–400)
RBC: 4.03 10*6/uL (ref 3.70–5.45)
RDW: 14.1 % (ref 11.2–14.5)
WBC: 4.5 10*3/uL (ref 3.9–10.3)
lymph#: 1 10*3/uL (ref 0.9–3.3)

## 2013-10-27 LAB — LACTATE DEHYDROGENASE (CC13): LDH: 233 U/L (ref 125–245)

## 2013-10-27 MED ORDER — DARBEPOETIN ALFA-POLYSORBATE 300 MCG/0.6ML IJ SOLN
300.0000 ug | Freq: Once | INTRAMUSCULAR | Status: AC
  Administered 2013-10-27: 300 ug via SUBCUTANEOUS
  Filled 2013-10-27: qty 0.6

## 2013-10-27 NOTE — Progress Notes (Signed)
[facility] NOTE  Horton Finer, MD [address], [address] [place]  [address]  DIAGNOSIS: Anemia due to chronic renal failure treated with erythropoietin, stage 3 (moderate) - Plan: CBC with Differential, Comprehensive metabolic panel (Cmet) - [facility], Lactate dehydrogenase (LDH) - [facility]  Chief Complaint  Patient presents with  . Anemia due to chronic renal failure treated with erythropoie    CURRENT THERAPY:  INTERVAL HISTORY: Lindsey Mcguire 79 y.o. female with a problem list consisting of:   1. Anemia secondary to renal insufficiency. Currently on Aranesp injections as needed for hemoglobin less than or equal to 11.  2. Renal insufficiency.  3. Spinal stenosis at L4-5 and L5-6 as per MRI of the lumbar spine on 06/16/2010.  4. Hypertension.  5. Diabetes mellitus type 2.  6. Systolic ejection murmur.  7. Status post cervical spine surgery by Dr. Hazle Coca in October 2010.  8. Last mammogram 02/11/2012  9. Last colonoscopy in 2008, next one due in 2018   I saw Lindsey Mcguire today for follow up of her anemia related to renal insufficiency. Lindsey Mcguire was last seen by [place] on 05/12/2013. Her condition has remains fairly stable. She reports occasional shortness of breath with exertion.  She occasionally uses a cane to ambulate. She has left back pain with radiation due to spinal stenosis as noted above.  She receives Aranesp injections when her hemoglobin is less than 11. The patient is without any major complaints today.   MEDICAL HISTORY: Past Medical History  Diagnosis Date  . Diabetes mellitus   . Hypertension   . Anemia 10/2003    INTERIM HISTORY: has Anemia due to chronic renal failure treated with erythropoietin on her problem list.    ALLERGIES:  has No Known Allergies.  MEDICATIONS: has a current medication list which includes the following prescription(s): amlodipine, aspirin, atorvastatin, calcium carb-cholecalciferol,  darbepoetin alfa-polysorbate, ferrous sulfate, glimepiride, multivitamin, nitroglycerin, pioglitazone, polyethyl glycol-propyl glycol, polyethylene glycol 3350, valsartan-hydrochlorothiazide, and vitamin e.  SURGICAL HISTORY:  Past Surgical History  Procedure Laterality Date  . Tubal ligation  1977  . Cataract extraction  12/06/2003    left eye    REVIEW OF SYSTEMS:   Constitutional: Denies fevers, chills or abnormal weight loss Eyes: Denies blurriness of vision Ears, nose, mouth, throat, and face: Denies mucositis or sore throat Respiratory: Denies cough, dyspnea or wheezes Cardiovascular: Denies palpitation, chest discomfort or lower extremity swelling Gastrointestinal:  Denies nausea, heartburn or change in bowel habits Skin: Denies abnormal skin rashes Lymphatics: Denies new lymphadenopathy or easy bruising Neurological:Denies numbness, tingling or new weaknesses Behavioral/Psych: Mood is stable, no new changes  All other systems were reviewed with the patient and are negative.  PHYSICAL EXAMINATION: ECOG PERFORMANCE STATUS: 0 - Asymptomatic  Blood pressure 148/88, pulse 75, temperature 97.1 F (36.2 C), temperature source Oral, resp. rate 20, height 5\' 1"  (1.549 m), weight 191 lb 8 oz (86.864 kg).  GENERAL:alert, no distress and comfortable, elderly female SKIN: skin color, texture, turgor are normal, no rashes or significant lesions; multiple moles on neck and face EYES: normal, Conjunctiva are pink and non-injected, sclera clear OROPHARYNX:no exudate, no erythema and lips, buccal mucosa, and tongue normal  NECK: supple, thyroid normal size, non-tender, without nodularity LYMPH:  no palpable lymphadenopathy in the cervical, axillary or supraclavicular LUNGS: clear to auscultation and percussion with normal breathing effort HEART: regular rate & rhythm and 1/6 SEM and no lower extremity edema ABDOMEN:abdomen soft, non-tender and normal bowel sounds  Musculoskeletal:no  cyanosis of digits and no clubbing  NEURO: alert & oriented x 3 with fluent speech, no focal motor/sensory deficits   LABORATORY DATA: Results for orders placed in visit on 10/27/13 (from the past 48 hour(s))  COMPREHENSIVE METABOLIC PANEL (0000000)     Status: Abnormal   Collection Time    10/27/13 10:11 AM      Result Value Ref Range   Sodium 142  136 - 145 mEq/L   Potassium 4.1  3.5 - 5.1 mEq/L   Chloride 107  98 - 109 mEq/L   CO2 24  22 - 29 mEq/L   Glucose 106  70 - 140 mg/dl   BUN 33.8 (*) 7.0 - 26.0 mg/dL   Creatinine 2.1 (*) 0.6 - 1.1 mg/dL   Total Bilirubin 0.32  0.20 - 1.20 mg/dL   Alkaline Phosphatase 94  40 - 150 U/L   AST 23  5 - 34 U/L   ALT 20  0 - 55 U/L   Total Protein 7.7  6.4 - 8.3 g/dL   Albumin 3.8  3.5 - 5.0 g/dL   Calcium 9.9  8.4 - 10.4 mg/dL   Anion Gap 10  3 - 11 mEq/L  CBC WITH DIFFERENTIAL     Status: Abnormal   Collection Time    10/27/13 10:11 AM      Result Value Ref Range   WBC 4.5  3.9 - 10.3 10e3/uL   NEUT# 2.7  1.5 - 6.5 10e3/uL   HGB 10.7 (*) 11.6 - 15.9 g/dL   HCT 33.7 (*) 34.8 - 46.6 %   Platelets 171  145 - 400 10e3/uL   MCV 83.6  79.5 - 101.0 fL   MCH 26.6  25.1 - 34.0 pg   MCHC 31.8  31.5 - 36.0 g/dL   RBC 4.03  3.70 - 5.45 10e6/uL   RDW 14.1  11.2 - 14.5 %   lymph# 1.0  0.9 - 3.3 10e3/uL   MONO# 0.6  0.1 - 0.9 10e3/uL   Eosinophils Absolute 0.1  0.0 - 0.5 10e3/uL   Basophils Absolute 0.0  0.0 - 0.1 10e3/uL   NEUT% 60.0  38.4 - 76.8 %   LYMPH% 22.9  14.0 - 49.7 %   MONO% 14.2 (*) 0.0 - 14.0 %   EOS% 2.2  0.0 - 7.0 %   BASO% 0.7  0.0 - 2.0 %  LACTATE DEHYDROGENASE (CC13)     Status: None   Collection Time    10/27/13 10:12 AM      Result Value Ref Range   LDH 233  125 - 245 U/L       Labs:  Lab Results  Component Value Date   WBC 4.5 10/27/2013   HGB 10.7* 10/27/2013   HCT 33.7* 10/27/2013   MCV 83.6 10/27/2013   PLT 171 10/27/2013   NEUTROABS 2.7 10/27/2013      Chemistry      Component Value Date/Time   NA 142 10/27/2013  1011   NA 138 11/07/2011 1224   K 4.1 10/27/2013 1011   K 3.9 11/07/2011 1224   CL 109* 05/07/2012 1043   CL 104 11/07/2011 1224   CO2 24 10/27/2013 1011   CO2 22 11/07/2011 1224   BUN 33.8* 10/27/2013 1011   BUN 38* 11/07/2011 1224   CREATININE 2.1* 10/27/2013 1011   CREATININE 2.06* 11/07/2011 1224      Component Value Date/Time   CALCIUM 9.9 10/27/2013 1011   CALCIUM 9.6 11/07/2011 1224  ALKPHOS 94 10/27/2013 1011   ALKPHOS 73 11/07/2011 1224   AST 23 10/27/2013 1011   AST 24 11/07/2011 1224   ALT 20 10/27/2013 1011   ALT 19 11/07/2011 1224   BILITOT 0.32 10/27/2013 1011   BILITOT 0.5 11/07/2011 1224     Basic Metabolic Panel:  Recent Labs Lab 10/27/13 1011  NA 142  K 4.1  CO2 24  GLUCOSE 106  BUN 33.8*  CREATININE 2.1*  CALCIUM 9.9   GFR Estimated Creatinine Clearance: 22.5 ml/min (by C-G formula based on Cr of 2.1). Liver Function Tests:  Recent Labs Lab 10/27/13 1011  AST 23  ALT 20  ALKPHOS 94  BILITOT 0.32  PROT 7.7  ALBUMIN 3.8   CBC:  Recent Labs Lab 10/27/13 1011  WBC 4.5  NEUTROABS 2.7  HGB 10.7*  HCT 33.7*  MCV 83.6  PLT 171   Anemia work up No results found for this basename: VITAMINB12, FOLATE, FERRITIN, TIBC, IRON, RETICCTPCT,  in the last 72 hours Microbiology No results found for this or any previous visit (from the past 240 hour(s)).  Studies:  No results found.   RADIOGRAPHIC STUDIES: 1. CT scan of the abdomen and pelvis with IV contrast on 05/15/2010 showed rectal fecal impaction without proximal bowel obstruction.  There was a small amount of adjacent free fluid in the pelvis. There is a calcified appendicolith without acute appendicitis. There was an umbilical hernia containing fat, as well as a non-incarcerated small bowel loop. The CT scan was obtained when the patient apparently went to the emergency room.  2. MRI of the lumbar spine without contrast on 06/16/2010 showed moderate spinal stenosis at L4-5 unchanged. There was moderate  spinal  stenosis at L5-S1 with progression of slip at L5 on S1 with advanced facet degeneration. There was interval development of  left foraminal and left lateral disk protrusion at L5-S1 compared with the prior MRI carried out on 06/14/2007.  3. Digital screening mammograms on 02/08/2011 and 02/20/2011 showed no significant abnormalities. Special views of the left breast were carried out as part of that exam.  4. Digital screening mammogram on 02/11/2012 showed no mammographic evidence of malignancy. 5. Digital screening mammogram of Left breast on 03/03/2013 showed no mammographic evidence of malignancy.  ASSESSMENT: TRACY-LEE SKEENS 79 y.o. female with a history of Anemia due to chronic renal failure treated with erythropoietin, stage 3 (moderate) - Plan: CBC with Differential, Comprehensive metabolic panel (Cmet) - [facility], Lactate dehydrogenase (LDH) - [facility]   PLAN:   1. Anemia due to chronic renal failure treated with erythropoietin. --Lindsey Mcguire will receive Aranesp today 200 mcg subcu for a hemoglobin of 10.7. Will continue to check CBCs every 2 months and administer Aranesp 200 mcg subcu whenever the hemoglobin is less than or equal to 11.   2. Follow-up. --We will plan to see Lindsey Mcguire again in 6 months at which time we will check CBC, chemistries, and iron studies.   All questions were answered. The patient knows to call the clinic with any problems, questions or concerns. We can certainly see the patient much sooner if necessary.  I spent 10 minutes counseling the patient face to face. The total time spent in the appointment was 15 minutes.    Neko Mcgeehan, MD 10/27/2013 1:59 PM

## 2013-10-27 NOTE — Telephone Encounter (Signed)
Gave pt  appt for lab, injections and MD until january 2015

## 2013-11-08 NOTE — Discharge Instructions (Signed)
Lindsey Mcguire  11/08/2013     Instructions    Activity: No Restrictions.   Diet: Resume Diet you were on at home.   Pain Medication: Tylenol if Needed.   CONTACT YOUR DOCTOR IF YOU HAVE PAIN, REDNESS IN YOUR EYE, OR DECREASED VISION.   Follow-up:today between 2:00-3:00 with Elta Guadeloupe T. Gershon Crane, MD.   Dr. Loran Senters: [phone]       If you find that you cannot contact your physician, but feel that your signs and   Symptoms warrant a physician's attention, call the Emergency Room at   [phone] [phone].

## 2013-11-09 ENCOUNTER — Ambulatory Visit (HOSPITAL_COMMUNITY)
Admission: RE | Admit: 2013-11-09 | Discharge: 2013-11-09 | Disposition: A | Discharge status: Home / Self Care | Payer: Medicare Other | Source: Ambulatory Visit | Attending: Ophthalmology | Admitting: Ophthalmology

## 2013-11-09 ENCOUNTER — Encounter (HOSPITAL_COMMUNITY): Payer: Self-pay | Admitting: *Deleted

## 2013-11-09 ENCOUNTER — Encounter (HOSPITAL_COMMUNITY)
Admission: RE | Disposition: A | Discharge status: Home / Self Care | Payer: Self-pay | Source: Ambulatory Visit | Attending: Ophthalmology

## 2013-11-09 DIAGNOSIS — H26499 Other secondary cataract, unspecified eye: Secondary | ICD-10-CM | POA: Insufficient documentation

## 2013-11-09 DIAGNOSIS — I129 Hypertensive chronic kidney disease with stage 1 through stage 4 chronic kidney disease, or unspecified chronic kidney disease: Secondary | ICD-10-CM | POA: Diagnosis not present

## 2013-11-09 DIAGNOSIS — N183 Chronic kidney disease, stage 3 unspecified: Secondary | ICD-10-CM | POA: Diagnosis not present

## 2013-11-09 DIAGNOSIS — E119 Type 2 diabetes mellitus without complications: Secondary | ICD-10-CM | POA: Diagnosis not present

## 2013-11-09 HISTORY — PX: YAG LASER APPLICATION: SHX6189

## 2013-11-09 SURGERY — TREATMENT, USING YAG LASER
Anesthesia: LOCAL | Laterality: Right

## 2013-11-09 MED ORDER — TROPICAMIDE 1 % OP SOLN
1.0000 [drp] | OPHTHALMIC | Status: AC
  Administered 2013-11-09 (×2): 1 [drp] via OPHTHALMIC

## 2013-11-09 MED ORDER — TROPICAMIDE 1 % OP SOLN
OPHTHALMIC | Status: AC
  Filled 2013-11-09: qty 3

## 2013-11-09 NOTE — H&P (Signed)
The patient was re examined and there is no change in the patients condition since the original H and P. 

## 2013-11-09 NOTE — Op Note (Signed)
Lindsey Ezzell T. Gershon Crane, MD  Procedure: Yag Capsulotomy  Yag Laser Self Test Completedyes. Procedure: Posterior Capsulotomy, Eye Protection Worn by Staff yes. Laser In Use Sign on Door yes.  Laser: Nd:YAG Spot Size: Fixed Burst Mode: III Power Setting: 3.7 mJ/burst Number of shots: 25 Total energy delivered: 91.8 mJ   The patient tolerated the procedure without difficulty. No complications were encountered.   The patient was discharged home with the instructions to continue all her current glaucoma medications, if any.   Patient instructed to go to office at 0100 for intraocular pressure check.  Patient verbalizes understanding of discharge instructions Yes.  .    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

## 2013-12-27 ENCOUNTER — Other Ambulatory Visit: Payer: Self-pay | Admitting: *Deleted

## 2013-12-27 DIAGNOSIS — N189 Chronic kidney disease, unspecified: Principal | ICD-10-CM

## 2013-12-27 DIAGNOSIS — D631 Anemia in chronic kidney disease: Secondary | ICD-10-CM

## 2013-12-28 ENCOUNTER — Ambulatory Visit: Payer: Medicare Other

## 2013-12-28 ENCOUNTER — Other Ambulatory Visit (HOSPITAL_BASED_OUTPATIENT_CLINIC_OR_DEPARTMENT_OTHER): Payer: Medicare Other

## 2013-12-28 DIAGNOSIS — N039 Chronic nephritic syndrome with unspecified morphologic changes: Secondary | ICD-10-CM

## 2013-12-28 DIAGNOSIS — N183 Chronic kidney disease, stage 3 unspecified: Secondary | ICD-10-CM

## 2013-12-28 DIAGNOSIS — D631 Anemia in chronic kidney disease: Secondary | ICD-10-CM

## 2013-12-28 DIAGNOSIS — N189 Chronic kidney disease, unspecified: Principal | ICD-10-CM

## 2013-12-28 LAB — CBC WITH DIFFERENTIAL/PLATELET
BASO%: 0.6 % (ref 0.0–2.0)
Basophils Absolute: 0 10*3/uL (ref 0.0–0.1)
EOS%: 1.8 % (ref 0.0–7.0)
Eosinophils Absolute: 0.1 10*3/uL (ref 0.0–0.5)
HEMATOCRIT: 36.9 % (ref 34.8–46.6)
HGB: 11.5 g/dL — ABNORMAL LOW (ref 11.6–15.9)
LYMPH%: 23.5 % (ref 14.0–49.7)
MCH: 26.3 pg (ref 25.1–34.0)
MCHC: 31.2 g/dL — ABNORMAL LOW (ref 31.5–36.0)
MCV: 84.2 fL (ref 79.5–101.0)
MONO#: 0.6 10*3/uL (ref 0.1–0.9)
MONO%: 12.9 % (ref 0.0–14.0)
NEUT#: 2.9 10*3/uL (ref 1.5–6.5)
NEUT%: 61.2 % (ref 38.4–76.8)
PLATELETS: 141 10*3/uL — AB (ref 145–400)
RBC: 4.38 10*6/uL (ref 3.70–5.45)
RDW: 14.3 % (ref 11.2–14.5)
WBC: 4.7 10*3/uL (ref 3.9–10.3)
lymph#: 1.1 10*3/uL (ref 0.9–3.3)

## 2013-12-28 MED ORDER — DARBEPOETIN ALFA-POLYSORBATE 300 MCG/0.6ML IJ SOLN: 300.0000 ug | Freq: Once | INTRAMUSCULAR | Status: DC

## 2014-02-14 ENCOUNTER — Other Ambulatory Visit (HOSPITAL_COMMUNITY): Payer: Self-pay | Admitting: Internal Medicine

## 2014-02-14 DIAGNOSIS — Z1231 Encounter for screening mammogram for malignant neoplasm of breast: Secondary | ICD-10-CM

## 2014-02-16 ENCOUNTER — Ambulatory Visit (HOSPITAL_COMMUNITY)
Admission: RE | Admit: 2014-02-16 | Discharge: 2014-02-16 | Disposition: A | Discharge status: Home / Self Care | Payer: Medicare Other | Source: Ambulatory Visit | Attending: Internal Medicine | Admitting: Internal Medicine

## 2014-02-16 DIAGNOSIS — Z1231 Encounter for screening mammogram for malignant neoplasm of breast: Secondary | ICD-10-CM | POA: Diagnosis present

## 2014-03-01 ENCOUNTER — Ambulatory Visit (HOSPITAL_BASED_OUTPATIENT_CLINIC_OR_DEPARTMENT_OTHER): Payer: Medicare Other

## 2014-03-01 ENCOUNTER — Other Ambulatory Visit (HOSPITAL_BASED_OUTPATIENT_CLINIC_OR_DEPARTMENT_OTHER): Payer: Medicare Other

## 2014-03-01 DIAGNOSIS — N189 Chronic kidney disease, unspecified: Principal | ICD-10-CM

## 2014-03-01 DIAGNOSIS — D631 Anemia in chronic kidney disease: Secondary | ICD-10-CM

## 2014-03-01 DIAGNOSIS — N183 Chronic kidney disease, stage 3 unspecified: Secondary | ICD-10-CM

## 2014-03-01 LAB — CBC WITH DIFFERENTIAL/PLATELET
BASO%: 0.4 % (ref 0.0–2.0)
Basophils Absolute: 0 10*3/uL (ref 0.0–0.1)
EOS%: 1.7 % (ref 0.0–7.0)
Eosinophils Absolute: 0.1 10*3/uL (ref 0.0–0.5)
HCT: 32.8 % — ABNORMAL LOW (ref 34.8–46.6)
HGB: 10.3 g/dL — ABNORMAL LOW (ref 11.6–15.9)
LYMPH#: 1.2 10*3/uL (ref 0.9–3.3)
LYMPH%: 21.4 % (ref 14.0–49.7)
MCH: 26 pg (ref 25.1–34.0)
MCHC: 31.4 g/dL — AB (ref 31.5–36.0)
MCV: 82.9 fL (ref 79.5–101.0)
MONO#: 0.8 10*3/uL (ref 0.1–0.9)
MONO%: 14.7 % — ABNORMAL HIGH (ref 0.0–14.0)
NEUT#: 3.4 10*3/uL (ref 1.5–6.5)
NEUT%: 61.8 % (ref 38.4–76.8)
Platelets: 146 10*3/uL (ref 145–400)
RBC: 3.96 10*6/uL (ref 3.70–5.45)
RDW: 14.8 % — ABNORMAL HIGH (ref 11.2–14.5)
WBC: 5.5 10*3/uL (ref 3.9–10.3)

## 2014-03-01 MED ORDER — DARBEPOETIN ALFA 300 MCG/0.6ML IJ SOSY
300.0000 ug | PREFILLED_SYRINGE | Freq: Once | INTRAMUSCULAR | Status: AC
  Administered 2014-03-01: 300 ug via SUBCUTANEOUS
  Filled 2014-03-01: qty 0.6

## 2014-03-01 NOTE — Patient Instructions (Signed)
Darbepoetin Alfa injection What is this medicine? DARBEPOETIN ALFA (dar be POE e tin AL fa) helps your body make more red blood cells. It is used to treat anemia caused by chronic kidney failure and chemotherapy. This medicine may be used for other purposes; ask your health care provider or pharmacist if you have questions. COMMON BRAND NAME(S): Aranesp What should I tell my health care provider before I take this medicine? They need to know if you have any of these conditions: -blood clotting disorders or history of blood clots -cancer patient not on chemotherapy -cystic fibrosis -heart disease, such as angina, heart failure, or a history of a heart attack -hemoglobin level of 12 g/dL or greater -high blood pressure -low levels of folate, iron, or vitamin B12 -seizures -an unusual or allergic reaction to darbepoetin, erythropoietin, albumin, hamster proteins, latex, other medicines, foods, dyes, or preservatives -pregnant or trying to get pregnant -breast-feeding How should I use this medicine? This medicine is for injection into a vein or under the skin. It is usually given by a health care professional in a hospital or clinic setting. If you get this medicine at home, you will be taught how to prepare and give this medicine. Do not shake the solution before you withdraw a dose. Use exactly as directed. Take your medicine at regular intervals. Do not take your medicine more often than directed. It is important that you put your used needles and syringes in a special sharps container. Do not put them in a trash can. If you do not have a sharps container, call your pharmacist or healthcare provider to get one. Talk to your pediatrician regarding the use of this medicine in children. While this medicine may be used in children as young as 1 year for selected conditions, precautions do apply. Overdosage: If you think you have taken too much of this medicine contact a poison control center or  emergency room at once. NOTE: This medicine is only for you. Do not share this medicine with others. What if I miss a dose? If you miss a dose, take it as soon as you can. If it is almost time for your next dose, take only that dose. Do not take double or extra doses. What may interact with this medicine? Do not take this medicine with any of the following medications: -epoetin alfa This list may not describe all possible interactions. Give your health care provider a list of all the medicines, herbs, non-prescription drugs, or dietary supplements you use. Also tell them if you smoke, drink alcohol, or use illegal drugs. Some items may interact with your medicine. What should I watch for while using this medicine? Visit your prescriber or health care professional for regular checks on your progress and for the needed blood tests and blood pressure measurements. It is especially important for the doctor to make sure your hemoglobin level is in the desired range, to limit the risk of potential side effects and to give you the best benefit. Keep all appointments for any recommended tests. Check your blood pressure as directed. Ask your doctor what your blood pressure should be and when you should contact him or her. As your body makes more red blood cells, you may need to take iron, folic acid, or vitamin B supplements. Ask your doctor or health care provider which products are right for you. If you have kidney disease continue dietary restrictions, even though this medication can make you feel better. Talk with your doctor or health   care professional about the foods you eat and the vitamins that you take. What side effects may I notice from receiving this medicine? Side effects that you should report to your doctor or health care professional as soon as possible: -allergic reactions like skin rash, itching or hives, swelling of the face, lips, or tongue -breathing problems -changes in vision -chest  pain -confusion, trouble speaking or understanding -feeling faint or lightheaded, falls -high blood pressure -muscle aches or pains -pain, swelling, warmth in the leg -rapid weight gain -severe headaches -sudden numbness or weakness of the face, arm or leg -trouble walking, dizziness, loss of balance or coordination -seizures (convulsions) -swelling of the ankles, feet, hands -unusually weak or tired Side effects that usually do not require medical attention (report to your doctor or health care professional if they continue or are bothersome): -diarrhea -fever, chills (flu-like symptoms) -headaches -nausea, vomiting -redness, stinging, or swelling at site where injected This list may not describe all possible side effects. Call your doctor for medical advice about side effects. You may report side effects to FDA at 1-800-FDA-1088. Where should I keep my medicine? Keep out of the reach of children. Store in a refrigerator between 2 and 8 degrees C (36 and 46 degrees F). Do not freeze. Do not shake. Throw away any unused portion if using a single-dose vial. Throw away any unused medicine after the expiration date. NOTE: This sheet is a summary. It may not cover all possible information. If you have questions about this medicine, talk to your doctor, pharmacist, or health care provider.  2015, [employer]. (2008-03-29 10:23:57)  

## 2014-04-12 ENCOUNTER — Telehealth: Payer: Self-pay | Admitting: Hematology

## 2014-04-19 ENCOUNTER — Telehealth: Payer: Self-pay | Admitting: Hematology

## 2014-04-19 NOTE — Telephone Encounter (Signed)
due to call day moved 1/5 appt to earlier time. s/w pt she is aware.

## 2014-05-02 ENCOUNTER — Other Ambulatory Visit: Payer: Medicare Other

## 2014-05-02 ENCOUNTER — Ambulatory Visit: Payer: Medicare Other

## 2014-05-03 ENCOUNTER — Encounter: Payer: Self-pay | Admitting: Hematology

## 2014-05-03 ENCOUNTER — Ambulatory Visit: Payer: Medicare Other

## 2014-05-03 ENCOUNTER — Telehealth: Payer: Self-pay | Admitting: Hematology

## 2014-05-03 ENCOUNTER — Ambulatory Visit (HOSPITAL_BASED_OUTPATIENT_CLINIC_OR_DEPARTMENT_OTHER): Payer: Medicare Other | Admitting: Hematology

## 2014-05-03 ENCOUNTER — Other Ambulatory Visit (HOSPITAL_BASED_OUTPATIENT_CLINIC_OR_DEPARTMENT_OTHER): Payer: Medicare Other

## 2014-05-03 VITALS — BP 129/50 | HR 71 | Temp 98.1°F | Resp 18 | Ht 61.0 in | Wt 191.9 lb

## 2014-05-03 DIAGNOSIS — N184 Chronic kidney disease, stage 4 (severe): Secondary | ICD-10-CM

## 2014-05-03 DIAGNOSIS — N189 Chronic kidney disease, unspecified: Principal | ICD-10-CM

## 2014-05-03 DIAGNOSIS — I10 Essential (primary) hypertension: Secondary | ICD-10-CM

## 2014-05-03 DIAGNOSIS — D631 Anemia in chronic kidney disease: Secondary | ICD-10-CM

## 2014-05-03 DIAGNOSIS — E119 Type 2 diabetes mellitus without complications: Secondary | ICD-10-CM

## 2014-05-03 LAB — CBC WITH DIFFERENTIAL/PLATELET
BASO%: 0.4 % (ref 0.0–2.0)
Basophils Absolute: 0 10*3/uL (ref 0.0–0.1)
EOS ABS: 0.1 10*3/uL (ref 0.0–0.5)
EOS%: 1.3 % (ref 0.0–7.0)
HCT: 34.3 % — ABNORMAL LOW (ref 34.8–46.6)
HGB: 11 g/dL — ABNORMAL LOW (ref 11.6–15.9)
LYMPH%: 30 % (ref 14.0–49.7)
MCH: 27.1 pg (ref 25.1–34.0)
MCHC: 32.1 g/dL (ref 31.5–36.0)
MCV: 84.5 fL (ref 79.5–101.0)
MONO#: 0.5 10*3/uL (ref 0.1–0.9)
MONO%: 9.9 % (ref 0.0–14.0)
NEUT%: 58.4 % (ref 38.4–76.8)
NEUTROS ABS: 2.8 10*3/uL (ref 1.5–6.5)
Platelets: 160 10*3/uL (ref 145–400)
RBC: 4.06 10*6/uL (ref 3.70–5.45)
RDW: 14.5 % (ref 11.2–14.5)
WBC: 4.7 10*3/uL (ref 3.9–10.3)
lymph#: 1.4 10*3/uL (ref 0.9–3.3)

## 2014-05-03 NOTE — Telephone Encounter (Signed)
Pt confirmed labs/ov per 01/05 POF, gave pt AVS.... KJ, per MD pt didn't need her injection due to her hemoglobin count was ok per Injection nurse and pt was advised by MD..Marland KitchenMarland Kitchen

## 2014-05-03 NOTE — Progress Notes (Signed)
Hgb: 11.0. Patient aware. No aranesp given

## 2014-05-03 NOTE — Progress Notes (Signed)
[place] [facility] OFFICE PROGRESS NOTE  Dorian Heckle, MD [address] [place] [address]  DIAGNOSIS: Anemia due to chronic renal failure treated with erythropoietin, stage 4 (severe) - Plan: CBC & Diff and Retic, Comprehensive metabolic panel (Cmet) - [facility]     CHIEF COMPLAIN: follow up   CURRENT THERAPY: Aranesp 200-381mcg every 3-4 month if Hb<11g/dl   INTERVAL HISTORY: Lindsey Mcguire 80 y.o. female with a problem list consisting of:   1. Anemia secondary to renal insufficiency. Currently on Aranesp injections as needed for hemoglobin less than or equal to 11.  2. Renal insufficiency.  3. Spinal stenosis at L4-5 and L5-6 as per MRI of the lumbar spine on 06/16/2010.  4. Hypertension.  5. Diabetes mellitus type 2.  6. Systolic ejection murmur.  7. Status post cervical spine surgery by Dr. Hazle Coca in October 2010.  8. Last mammogram 02/11/2012  9. Last colonoscopy in 2008, next one due in 2018   INTERIM HISTORY: I saw Lindsey Mcguire today for follow up. She is doing very well overall. She has good energy level, functions very well at home. She denies any pain, dyspnea on exertion, cough, nausea or any other symptoms. Her weight is stable.  MEDICAL HISTORY: Past Medical History  Diagnosis Date  . Diabetes mellitus   . Hypertension   . Anemia 10/2003    INTERIM HISTORY: has Anemia due to chronic renal failure treated with erythropoietin on her problem list.    ALLERGIES:  has No Known Allergies.  MEDICATIONS: has a current medication list which includes the following prescription(s): amlodipine, aspirin, atorvastatin, calcium carb-cholecalciferol, ferrous sulfate, glimepiride, multivitamin, nitroglycerin, pioglitazone, polyethyl glycol-propyl glycol, polyethylene glycol 3350, valsartan-hydrochlorothiazide, vitamin e, and darbepoetin alfa-polysorbate.  SURGICAL HISTORY:  Past Surgical History  Procedure Laterality Date  . Tubal ligation   1977  . Cataract extraction  12/06/2003    left eye  . Yag laser application Right [id number]    Procedure: YAG LASER APPLICATION;  Surgeon: Elta Guadeloupe T. Gershon Crane, MD;  Location: [facility];  Service: Ophthalmology;  Laterality: Right;    REVIEW OF SYSTEMS:   Constitutional: Denies fevers, chills or abnormal weight loss Eyes: Denies blurriness of vision Ears, nose, mouth, throat, and face: Denies mucositis or sore throat Respiratory: Denies cough, dyspnea or wheezes Cardiovascular: Denies palpitation, chest discomfort or lower extremity swelling Gastrointestinal:  Denies nausea, heartburn or change in bowel habits Skin: Denies abnormal skin rashes Lymphatics: Denies new lymphadenopathy or easy bruising Neurological:Denies numbness, tingling or new weaknesses Behavioral/Psych: Mood is stable, no new changes  All other systems were reviewed with the patient and are negative.  PHYSICAL EXAMINATION: ECOG PERFORMANCE STATUS: 0 - Asymptomatic  Blood pressure 129/50, pulse 71, temperature 98.1 F (36.7 C), temperature source Oral, resp. rate 18, height 5\' 1"  (1.549 m), weight 191 lb 14.4 oz (87.045 kg).  GENERAL:alert, no distress and comfortable, elderly female SKIN: skin color, texture, turgor are normal, no rashes or significant lesions; multiple moles on neck and face EYES: normal, Conjunctiva are pink and non-injected, sclera clear OROPHARYNX:no exudate, no erythema and lips, buccal mucosa, and tongue normal  NECK: supple, thyroid normal size, non-tender, without nodularity LYMPH:  no palpable lymphadenopathy in the cervical, axillary or supraclavicular LUNGS: clear to auscultation and percussion with normal breathing effort HEART: regular rate & rhythm and 1/6 SEM and no lower extremity edema ABDOMEN:abdomen soft, non-tender and normal bowel sounds Musculoskeletal:no cyanosis of digits and no clubbing  NEURO: alert & oriented x 3  with fluent speech, no focal motor/sensory  deficits   LABORATORY DATA: CBC Latest Ref Rng 05/03/2014 03/01/2014 12/28/2013  WBC 3.9 - 10.3 10e3/uL 4.7 5.5 4.7  Hemoglobin 11.6 - 15.9 g/dL 11.0(L) 10.3(L) 11.5(L)  Hematocrit 34.8 - 46.6 % 34.3(L) 32.8(L) 36.9  Platelets 145 - 400 10e3/uL 160 146 141(L)    CMP Latest Ref Rng 10/27/2013 05/12/2013 11/05/2012  Glucose 70 - 140 mg/dl 106 123 108  BUN 7.0 - 26.0 mg/dL 33.8(H) 35.9(H) 38.6(H)  Creatinine 0.6 - 1.1 mg/dL 2.1(H) 2.0(H) 2.2(H)  Sodium 136 - 145 mEq/L 142 142 140  Potassium 3.5 - 5.1 mEq/L 4.1 3.8 3.9  Chloride 98 - 107 mEq/L - - -  CO2 22 - 29 mEq/L 24 22 22   Calcium 8.4 - 10.4 mg/dL 9.9 9.6 9.6  Total Protein 6.4 - 8.3 g/dL 7.7 7.8 7.8  Total Bilirubin 0.20 - 1.20 mg/dL 0.32 0.35 0.23  Alkaline Phos 40 - 150 U/L 94 102 85  AST 5 - 34 U/L 23 20 26   ALT 0 - 55 U/L 20 19 24       RADIOGRAPHIC STUDIES: 1. CT scan of the abdomen and pelvis with IV contrast on 05/15/2010 showed rectal fecal impaction without proximal bowel obstruction.  There was a small amount of adjacent free fluid in the pelvis. There is a calcified appendicolith without acute appendicitis. There was an umbilical hernia containing fat, as well as a non-incarcerated small bowel loop. The CT scan was obtained when the patient apparently went to the emergency room.  2. MRI of the lumbar spine without contrast on 06/16/2010 showed moderate spinal stenosis at L4-5 unchanged. There was moderate  spinal stenosis at L5-S1 with progression of slip at L5 on S1 with advanced facet degeneration. There was interval development of  left foraminal and left lateral disk protrusion at L5-S1 compared with the prior MRI carried out on 06/14/2007.  3. Digital screening mammograms on 02/08/2011 and 02/20/2011 showed no significant abnormalities. Special views of the left breast were carried out as part of that exam.  4. Digital screening mammogram on 02/11/2012 showed no mammographic evidence of malignancy. 5. Digital screening  mammogram of Left breast on 03/03/2013 showed no mammographic evidence of malignancy.  ASSESSMENT: Lindsey Mcguire 80 y.o. female with a history of Anemia due to chronic renal failure treated with erythropoietin, stage 4 (severe) - Plan: CBC & Diff and Retic, Comprehensive metabolic panel (Cmet) - [facility]   PLAN:   1. Anemia due to chronic renal failure treated with erythropoietin. -her hemoglobin was 11 today. No need Aranesp today. Her last Aranesp was about one month ago. She has been getting the injection every 3-4 months. -Return to clinic in 2 months for follow-up, for possible Aranesp injection. She can be followed every 4 months afterwards.  2. CKD stage IV  -She will follow-up with her nephrologist..   3.  hypertension, diabetes She will follow up with primary care physician.  All questions were answered. The patient knows to call the clinic with any problems, questions or concerns. We can certainly see the patient much sooner if necessary.  I spent 10 minutes counseling the patient face to face. The total time spent in the appointment was 15 minutes.    Truitt Merle, MD 05/03/2014 1:32 PM

## 2014-07-04 ENCOUNTER — Ambulatory Visit (HOSPITAL_BASED_OUTPATIENT_CLINIC_OR_DEPARTMENT_OTHER): Payer: Medicare Other

## 2014-07-04 ENCOUNTER — Telehealth: Payer: Self-pay | Admitting: Hematology

## 2014-07-04 ENCOUNTER — Ambulatory Visit (HOSPITAL_BASED_OUTPATIENT_CLINIC_OR_DEPARTMENT_OTHER): Payer: Medicare Other | Admitting: Hematology

## 2014-07-04 ENCOUNTER — Other Ambulatory Visit (HOSPITAL_BASED_OUTPATIENT_CLINIC_OR_DEPARTMENT_OTHER): Payer: Medicare Other

## 2014-07-04 ENCOUNTER — Encounter: Payer: Self-pay | Admitting: Hematology

## 2014-07-04 VITALS — BP 132/52 | HR 72 | Temp 97.9°F | Resp 18 | Ht 61.0 in | Wt 198.3 lb

## 2014-07-04 DIAGNOSIS — N184 Chronic kidney disease, stage 4 (severe): Secondary | ICD-10-CM

## 2014-07-04 DIAGNOSIS — N183 Chronic kidney disease, stage 3 unspecified: Secondary | ICD-10-CM

## 2014-07-04 DIAGNOSIS — D631 Anemia in chronic kidney disease: Secondary | ICD-10-CM

## 2014-07-04 DIAGNOSIS — E119 Type 2 diabetes mellitus without complications: Secondary | ICD-10-CM

## 2014-07-04 DIAGNOSIS — I10 Essential (primary) hypertension: Secondary | ICD-10-CM

## 2014-07-04 LAB — CBC & DIFF AND RETIC
BASO%: 0.4 % (ref 0.0–2.0)
BASOS ABS: 0 10*3/uL (ref 0.0–0.1)
EOS%: 1.8 % (ref 0.0–7.0)
Eosinophils Absolute: 0.1 10*3/uL (ref 0.0–0.5)
HCT: 30.9 % — ABNORMAL LOW (ref 34.8–46.6)
HEMOGLOBIN: 10.1 g/dL — AB (ref 11.6–15.9)
Immature Retic Fract: 5.8 % (ref 1.60–10.00)
LYMPH%: 21.4 % (ref 14.0–49.7)
MCH: 27 pg (ref 25.1–34.0)
MCHC: 32.7 g/dL (ref 31.5–36.0)
MCV: 82.6 fL (ref 79.5–101.0)
MONO#: 0.5 10*3/uL (ref 0.1–0.9)
MONO%: 11.5 % (ref 0.0–14.0)
NEUT#: 3 10*3/uL (ref 1.5–6.5)
NEUT%: 64.9 % (ref 38.4–76.8)
Platelets: 131 10*3/uL — ABNORMAL LOW (ref 145–400)
RBC: 3.74 10*6/uL (ref 3.70–5.45)
RDW: 15.1 % — AB (ref 11.2–14.5)
RETIC CT ABS: 26.93 10*3/uL — AB (ref 33.70–90.70)
Retic %: 0.72 % (ref 0.70–2.10)
WBC: 4.5 10*3/uL (ref 3.9–10.3)
lymph#: 1 10*3/uL (ref 0.9–3.3)

## 2014-07-04 LAB — COMPREHENSIVE METABOLIC PANEL (CC13)
ALT: 9 U/L (ref 0–55)
AST: 18 U/L (ref 5–34)
Albumin: 3.7 g/dL (ref 3.5–5.0)
Alkaline Phosphatase: 70 U/L (ref 40–150)
Anion Gap: 8 mEq/L (ref 3–11)
BUN: 29.2 mg/dL — ABNORMAL HIGH (ref 7.0–26.0)
CO2: 23 meq/L (ref 22–29)
CREATININE: 1.9 mg/dL — AB (ref 0.6–1.1)
Calcium: 8.9 mg/dL (ref 8.4–10.4)
Chloride: 109 mEq/L (ref 98–109)
EGFR: 28 mL/min/{1.73_m2} — ABNORMAL LOW (ref >90)
Glucose: 100 mg/dl (ref 70–140)
POTASSIUM: 3.7 meq/L (ref 3.5–5.1)
Sodium: 140 mEq/L (ref 136–145)
Total Bilirubin: 0.3 mg/dL (ref 0.20–1.20)
Total Protein: 7.1 g/dL (ref 6.4–8.3)

## 2014-07-04 MED ORDER — DARBEPOETIN ALFA 300 MCG/0.6ML IJ SOSY
300.0000 ug | PREFILLED_SYRINGE | Freq: Once | INTRAMUSCULAR | Status: AC
  Administered 2014-07-04: 300 ug via SUBCUTANEOUS
  Filled 2014-07-04: qty 0.6

## 2014-07-04 NOTE — Progress Notes (Signed)
[facility] OFFICE PROGRESS NOTE  Dorian Heckle, MD [address] [place] [address]  DIAGNOSIS: Anemia due to chronic renal failure treated with erythropoietin, stage 4 (severe)     CHIEF COMPLAIN: follow up   CURRENT THERAPY: Aranesp 200-316mcg every 3-4 month if Hb<11g/dl   INTERVAL HISTORY: Lindsey Mcguire 80 y.o. female with a problem list consisting of:   1. Anemia secondary to renal insufficiency. Currently on Aranesp injections as needed for hemoglobin less than or equal to 11.  2. Renal insufficiency.  3. Spinal stenosis at L4-5 and L5-6 as per MRI of the lumbar spine on 06/16/2010.  4. Hypertension.  5. Diabetes mellitus type 2.  6. Systolic ejection murmur.  7. Status post cervical spine surgery by Dr. Hazle Coca in October 2010.  8. Last mammogram 01/2014  9. Last colonoscopy in 2008, next one due in 2018   INTERIM HISTORY: I saw Lindsey Mcguire today for follow up. She is doing well overall. She has chronic knee and low back pain, which has not changed lately. She has mild fatigue, able to function well at home. No other medical events in the past 2 months.  MEDICAL HISTORY: Past Medical History  Diagnosis Date  . Diabetes mellitus   . Hypertension   . Anemia 10/2003    INTERIM HISTORY: has Anemia due to chronic renal failure treated with erythropoietin on her problem list.    ALLERGIES:  has No Known Allergies.  MEDICATIONS: has a current medication list which includes the following prescription(s): amlodipine, aspirin, atorvastatin, calcium carb-cholecalciferol, ferrous sulfate, glimepiride, multivitamin, nitroglycerin, pioglitazone, polyethyl glycol-propyl glycol, polyethylene glycol 3350, valsartan-hydrochlorothiazide, vitamin e, and darbepoetin alfa-polysorbate.  SURGICAL HISTORY:  Past Surgical History  Procedure Laterality Date  . Tubal ligation  1977  . Cataract extraction  12/06/2003    left eye  . Yag laser  application Right [id number]    Procedure: YAG LASER APPLICATION;  Surgeon: Elta Guadeloupe T. Gershon Crane, MD;  Location: [facility];  Service: Ophthalmology;  Laterality: Right;    REVIEW OF SYSTEMS:   Constitutional: Denies fevers, chills or abnormal weight loss Eyes: Denies blurriness of vision Ears, nose, mouth, throat, and face: Denies mucositis or sore throat Respiratory: Denies cough, dyspnea or wheezes Cardiovascular: Denies palpitation, chest discomfort or lower extremity swelling Gastrointestinal:  Denies nausea, heartburn or change in bowel habits Skin: Denies abnormal skin rashes Lymphatics: Denies new lymphadenopathy or easy bruising Neurological:Denies numbness, tingling or new weaknesses Behavioral/Psych: Mood is stable, no new changes  All other systems were reviewed with the patient and are negative.  PHYSICAL EXAMINATION: ECOG PERFORMANCE STATUS: 0 - Asymptomatic  Blood pressure 132/52, pulse 72, temperature 97.9 F (36.6 C), temperature source Oral, resp. rate 18, height 5\' 1"  (1.549 m), weight 198 lb 4.8 oz (89.948 kg), SpO2 100 %.  GENERAL:alert, no distress and comfortable, elderly female SKIN: skin color, texture, turgor are normal, no rashes or significant lesions; multiple moles on neck and face EYES: normal, Conjunctiva are pink and non-injected, sclera clear OROPHARYNX:no exudate, no erythema and lips, buccal mucosa, and tongue normal  NECK: supple, thyroid normal size, non-tender, without nodularity LYMPH:  no palpable lymphadenopathy in the cervical, axillary or supraclavicular LUNGS: clear to auscultation and percussion with normal breathing effort HEART: regular rate & rhythm and 1/6 SEM and no lower extremity edema ABDOMEN:abdomen soft, non-tender and normal bowel sounds Musculoskeletal:no cyanosis of digits and no clubbing  NEURO: alert & oriented x 3 with fluent speech, no focal motor/sensory  deficits   LABORATORY DATA: CBC Latest Ref Rng 07/04/2014 05/03/2014  03/01/2014  WBC 3.9 - 10.3 10e3/uL 4.5 4.7 5.5  Hemoglobin 11.6 - 15.9 g/dL 10.1(L) 11.0(L) 10.3(L)  Hematocrit 34.8 - 46.6 % 30.9(L) 34.3(L) 32.8(L)  Platelets 145 - 400 10e3/uL 131(L) 160 146    CMP Latest Ref Rng 07/04/2014 10/27/2013 05/12/2013  Glucose 70 - 140 mg/dl 100 106 123  BUN 7.0 - 26.0 mg/dL 29.2(H) 33.8(H) 35.9(H)  Creatinine 0.6 - 1.1 mg/dL 1.9(H) 2.1(H) 2.0(H)  Sodium 136 - 145 mEq/L 140 142 142  Potassium 3.5 - 5.1 mEq/L 3.7 4.1 3.8  Chloride 98 - 107 mEq/L - - -  CO2 22 - 29 mEq/L 23 24 22   Calcium 8.4 - 10.4 mg/dL 8.9 9.9 9.6  Total Protein 6.4 - 8.3 g/dL 7.1 7.7 7.8  Total Bilirubin 0.20 - 1.20 mg/dL 0.30 0.32 0.35  Alkaline Phos 40 - 150 U/L 70 94 102  AST 5 - 34 U/L 18 23 20   ALT 0 - 55 U/L 9 20 19       RADIOGRAPHIC STUDIES: 1. CT scan of the abdomen and pelvis with IV contrast on 05/15/2010 showed rectal fecal impaction without proximal bowel obstruction.  There was a small amount of adjacent free fluid in the pelvis. There is a calcified appendicolith without acute appendicitis. There was an umbilical hernia containing fat, as well as a non-incarcerated small bowel loop. The CT scan was obtained when the patient apparently went to the emergency room.  2. MRI of the lumbar spine without contrast on 06/16/2010 showed moderate spinal stenosis at L4-5 unchanged. There was moderate  spinal stenosis at L5-S1 with progression of slip at L5 on S1 with advanced facet degeneration. There was interval development of  left foraminal and left lateral disk protrusion at L5-S1 compared with the prior MRI carried out on 06/14/2007.  3. Digital screening mammograms on 02/08/2011 and 02/20/2011 showed no significant abnormalities. Special views of the left breast were carried out as part of that exam.  4. Digital screening mammogram on 02/11/2012 showed no mammographic evidence of malignancy. 5. Digital screening mammogram of Left breast on 03/03/2013 showed no mammographic evidence  of malignancy.  ASSESSMENT: Lindsey Mcguire 80 y.o. female with a history of Anemia due to chronic renal failure treated with erythropoietin, stage 4 (severe)   PLAN:   1. Anemia due to chronic renal failure treated with erythropoietin. -her hemoglobin was 10.1 today, will give Aranesp today. She has been getting the injection every 4 months on average. -Side effects from Aranesp were discussed with patient, which includes but not limited to thrombosis, stroke, heart attack, etc -Return to clinic in 4 months for follow-up, for possible Aranesp injection if Hb<11.   2. CKD stage IV  -She will follow-up with her nephrologist..   3.  hypertension, diabetes, arthritis  She will follow up with primary care physician.  All questions were answered. The patient knows to call the clinic with any problems, questions or concerns. We can certainly see the patient much sooner if necessary.  I spent 10 minutes counseling the patient face to face. The total time spent in the appointment was 15 minutes.    Truitt Merle, MD 07/04/2014 10:58 AM

## 2014-07-04 NOTE — Telephone Encounter (Signed)
gv adn printed appt sched and avs for pt for July  °

## 2014-11-07 ENCOUNTER — Telehealth: Payer: Self-pay | Admitting: Hematology

## 2014-11-07 ENCOUNTER — Ambulatory Visit (HOSPITAL_BASED_OUTPATIENT_CLINIC_OR_DEPARTMENT_OTHER): Payer: Medicare Other | Admitting: Hematology

## 2014-11-07 ENCOUNTER — Other Ambulatory Visit (HOSPITAL_BASED_OUTPATIENT_CLINIC_OR_DEPARTMENT_OTHER): Payer: Medicare Other

## 2014-11-07 ENCOUNTER — Ambulatory Visit (HOSPITAL_BASED_OUTPATIENT_CLINIC_OR_DEPARTMENT_OTHER): Payer: Medicare Other

## 2014-11-07 ENCOUNTER — Encounter: Payer: Self-pay | Admitting: Hematology

## 2014-11-07 VITALS — BP 121/53 | HR 75 | Temp 98.5°F | Resp 18 | Ht 61.0 in | Wt 187.5 lb

## 2014-11-07 DIAGNOSIS — N184 Chronic kidney disease, stage 4 (severe): Secondary | ICD-10-CM

## 2014-11-07 DIAGNOSIS — D631 Anemia in chronic kidney disease: Secondary | ICD-10-CM

## 2014-11-07 DIAGNOSIS — N183 Chronic kidney disease, stage 3 (moderate): Principal | ICD-10-CM

## 2014-11-07 LAB — CBC & DIFF AND RETIC
BASO%: 0.2 % (ref 0.0–2.0)
Basophils Absolute: 0 10*3/uL (ref 0.0–0.1)
EOS ABS: 0.1 10*3/uL (ref 0.0–0.5)
EOS%: 2.3 % (ref 0.0–7.0)
HCT: 31.9 % — ABNORMAL LOW (ref 34.8–46.6)
HGB: 10.5 g/dL — ABNORMAL LOW (ref 11.6–15.9)
Immature Retic Fract: 5.8 % (ref 1.60–10.00)
LYMPH%: 23.9 % (ref 14.0–49.7)
MCH: 27.2 pg (ref 25.1–34.0)
MCHC: 32.9 g/dL (ref 31.5–36.0)
MCV: 82.6 fL (ref 79.5–101.0)
MONO#: 0.7 10*3/uL (ref 0.1–0.9)
MONO%: 14.1 % — ABNORMAL HIGH (ref 0.0–14.0)
NEUT#: 3.1 10*3/uL (ref 1.5–6.5)
NEUT%: 59.5 % (ref 38.4–76.8)
Platelets: 145 10*3/uL (ref 145–400)
RBC: 3.86 10*6/uL (ref 3.70–5.45)
RDW: 15.2 % — ABNORMAL HIGH (ref 11.2–14.5)
RETIC %: 0.7 % (ref 0.70–2.10)
RETIC CT ABS: 27.02 10*3/uL — AB (ref 33.70–90.70)
WBC: 5.2 10*3/uL (ref 3.9–10.3)
lymph#: 1.3 10*3/uL (ref 0.9–3.3)

## 2014-11-07 LAB — COMPREHENSIVE METABOLIC PANEL (CC13)
ALK PHOS: 94 U/L (ref 40–150)
ALT: 18 U/L (ref 0–55)
AST: 23 U/L (ref 5–34)
Albumin: 3.9 g/dL (ref 3.5–5.0)
Anion Gap: 9 mEq/L (ref 3–11)
BUN: 23.9 mg/dL (ref 7.0–26.0)
CALCIUM: 9.7 mg/dL (ref 8.4–10.4)
CO2: 23 meq/L (ref 22–29)
Chloride: 109 mEq/L (ref 98–109)
Creatinine: 1.9 mg/dL — ABNORMAL HIGH (ref 0.6–1.1)
EGFR: 29 mL/min/{1.73_m2} — AB (ref >90)
Glucose: 121 mg/dl (ref 70–140)
POTASSIUM: 4.1 meq/L (ref 3.5–5.1)
Sodium: 141 mEq/L (ref 136–145)
Total Bilirubin: 0.39 mg/dL (ref 0.20–1.20)
Total Protein: 7.6 g/dL (ref 6.4–8.3)

## 2014-11-07 MED ORDER — DARBEPOETIN ALFA 300 MCG/0.6ML IJ SOSY
300.0000 ug | PREFILLED_SYRINGE | Freq: Once | INTRAMUSCULAR | Status: AC
  Administered 2014-11-07: 300 ug via SUBCUTANEOUS
  Filled 2014-11-07: qty 0.6

## 2014-11-07 NOTE — Telephone Encounter (Signed)
Pt confirmed labs/ov/inj per 07/11 POF, gave pt AVS and Calendar.... KJ

## 2014-11-07 NOTE — Progress Notes (Signed)
[facility] OFFICE PROGRESS NOTE  Lindsey Heckle, MD [address] [place] [address]  DIAGNOSIS: Anemia due to chronic renal failure treated with erythropoietin, stage 4 (severe)     CHIEF COMPLAIN: follow up   CURRENT THERAPY: Aranesp 200-324mcg every 3-4 month if Hb<11g/dl   INTERVAL HISTORY: Lindsey Mcguire 80 y.o. female with a problem list consisting of:   1. Anemia secondary to renal insufficiency. Currently on Aranesp injections as needed for hemoglobin less than or equal to 11.  2. Renal insufficiency.  3. Spinal stenosis at L4-5 and L5-6 as per MRI of the lumbar spine on 06/16/2010.  4. Hypertension.  5. Diabetes mellitus type 2.  6. Systolic ejection murmur.  7. Status post cervical spine surgery by Dr. Hazle Coca in October 2010.  8. Last mammogram 01/2014  9. Last colonoscopy in 2008, next one due in 2018   INTERIM HISTORY: Lindsey Mcguire returns for follow-up and Aranesp injection. She is doing very well. She has chronic arthritis related pain, mild and stable. Energy level is fairly good. She denies any other pain, dyspnea or other symptoms.  MEDICAL HISTORY: Past Medical History  Diagnosis Date  . Diabetes mellitus   . Hypertension   . Anemia 10/2003    INTERIM HISTORY: has Anemia due to chronic renal failure treated with erythropoietin on her problem list.    ALLERGIES:  has No Known Allergies.  MEDICATIONS: has a current medication list which includes the following prescription(s): amlodipine, aspirin, atorvastatin, calcium carb-cholecalciferol, ferrous sulfate, glimepiride, multivitamin, nitroglycerin, pioglitazone, polyethyl glycol-propyl glycol, polyethylene glycol 3350, valsartan-hydrochlorothiazide, vitamin e, and darbepoetin alfa-polysorbate.  SURGICAL HISTORY:  Past Surgical History  Procedure Laterality Date  . Tubal ligation  1977  . Cataract extraction  12/06/2003    left eye  . Yag laser application Right  [id number]    Procedure: YAG LASER APPLICATION;  Surgeon: Elta Guadeloupe T. Gershon Crane, MD;  Location: [facility];  Service: Ophthalmology;  Laterality: Right;    REVIEW OF SYSTEMS:   Constitutional: Denies fevers, chills or abnormal weight loss Eyes: Denies blurriness of vision Ears, nose, mouth, throat, and face: Denies mucositis or sore throat Respiratory: Denies cough, dyspnea or wheezes Cardiovascular: Denies palpitation, chest discomfort or lower extremity swelling Gastrointestinal:  Denies nausea, heartburn or change in bowel habits Skin: Denies abnormal skin rashes Lymphatics: Denies new lymphadenopathy or easy bruising Neurological:Denies numbness, tingling or new weaknesses Behavioral/Psych: Mood is stable, no new changes  All other systems were reviewed with the patient and are negative.  PHYSICAL EXAMINATION: ECOG PERFORMANCE STATUS: 0 - Asymptomatic  Blood pressure 121/53, pulse 75, temperature 98.5 F (36.9 C), temperature source Oral, resp. rate 18, height 5\' 1"  (1.549 m), weight 187 lb 8 oz (85.049 kg), SpO2 98 %.  GENERAL:alert, no distress and comfortable, elderly female SKIN: skin color, texture, turgor are normal, no rashes or significant lesions; multiple moles on neck and face EYES: normal, Conjunctiva are pink and non-injected, sclera clear OROPHARYNX:no exudate, no erythema and lips, buccal mucosa, and tongue normal  NECK: supple, thyroid normal size, non-tender, without nodularity LYMPH:  no palpable lymphadenopathy in the cervical, axillary or supraclavicular LUNGS: clear to auscultation and percussion with normal breathing effort HEART: regular rate & rhythm and 1/6 SEM and no lower extremity edema ABDOMEN:abdomen soft, non-tender and normal bowel sounds Musculoskeletal:no cyanosis of digits and no clubbing  NEURO: alert & oriented x 3 with fluent speech, no focal motor/sensory deficits   LABORATORY DATA: CBC Latest Ref Rng 11/07/2014  07/04/2014 05/03/2014  WBC 3.9 - 10.3  10e3/uL 5.2 4.5 4.7  Hemoglobin 11.6 - 15.9 g/dL 10.5(L) 10.1(L) 11.0(L)  Hematocrit 34.8 - 46.6 % 31.9(L) 30.9(L) 34.3(L)  Platelets 145 - 400 10e3/uL 145 131(L) 160    CMP Latest Ref Rng 11/07/2014 07/04/2014 10/27/2013  Glucose 70 - 140 mg/dl 121 100 106  BUN 7.0 - 26.0 mg/dL 23.9 29.2(H) 33.8(H)  Creatinine 0.6 - 1.1 mg/dL 1.9(H) 1.9(H) 2.1(H)  Sodium 136 - 145 mEq/L 141 140 142  Potassium 3.5 - 5.1 mEq/L 4.1 3.7 4.1  Chloride 98 - 107 mEq/L - - -  CO2 22 - 29 mEq/L 23 23 24   Calcium 8.4 - 10.4 mg/dL 9.7 8.9 9.9  Total Protein 6.4 - 8.3 g/dL 7.6 7.1 7.7  Total Bilirubin 0.20 - 1.20 mg/dL 0.39 0.30 0.32  Alkaline Phos 40 - 150 U/L 94 70 94  AST 5 - 34 U/L 23 18 23   ALT 0 - 55 U/L 18 9 20       RADIOGRAPHIC STUDIES: 1. CT scan of the abdomen and pelvis with IV contrast on 05/15/2010 showed rectal fecal impaction without proximal bowel obstruction.  There was a small amount of adjacent free fluid in the pelvis. There is a calcified appendicolith without acute appendicitis. There was an umbilical hernia containing fat, as well as a non-incarcerated small bowel loop. The CT scan was obtained when the patient apparently went to the emergency room.  2. MRI of the lumbar spine without contrast on 06/16/2010 showed moderate spinal stenosis at L4-5 unchanged. There was moderate  spinal stenosis at L5-S1 with progression of slip at L5 on S1 with advanced facet degeneration. There was interval development of  left foraminal and left lateral disk protrusion at L5-S1 compared with the prior MRI carried out on 06/14/2007.  3. Digital screening mammograms on 02/08/2011 and 02/20/2011 showed no significant abnormalities. Special views of the left breast were carried out as part of that exam.  4. Digital screening mammogram on 02/11/2012 showed no mammographic evidence of malignancy. 5. Digital screening mammogram of Left breast on 03/03/2013 showed no mammographic evidence of malignancy.  ASSESSMENT:  Lindsey Mcguire 80 y.o. female with a history of Anemia due to chronic renal failure treated with erythropoietin, stage 4 (severe)   PLAN:   1. Anemia due to chronic renal failure treated with erythropoietin. -her hemoglobin was 10.5 today, will give Aranesp today. She has been getting the injection every 4 months on average. -Side effects from Aranesp were discussed with patient, which includes but not limited to thrombosis, stroke, heart attack, etc -Return to clinic in 4 months for follow-up, for possible Aranesp injection if Hb<11.   2. CKD stage IV  -She will follow-up with her nephrologist Dr. Florene Glen   3.  hypertension, diabetes, arthritis  She will follow up with primary care physician.  FOLLOW UP: Return to clinic for CBC and Aranesp injection every 4 months, I'll see her back in 8 monthS  All questions were answered. The patient knows to call the clinic with any problems, questions or concerns. We can certainly see the patient much sooner if necessary.  I spent 10 minutes counseling the patient face to face. The total time spent in the appointment was 15 minutes.    Truitt Merle, MD 11/07/2014 11:42 AM

## 2015-01-16 ENCOUNTER — Emergency Department (HOSPITAL_COMMUNITY)
Admission: EM | Admit: 2015-01-16 | Discharge: 2015-01-16 | Disposition: A | Discharge status: Home / Self Care | Payer: Medicare Other | Attending: Emergency Medicine | Admitting: Emergency Medicine

## 2015-01-16 ENCOUNTER — Encounter (HOSPITAL_COMMUNITY): Payer: Self-pay

## 2015-01-16 DIAGNOSIS — E119 Type 2 diabetes mellitus without complications: Secondary | ICD-10-CM | POA: Insufficient documentation

## 2015-01-16 DIAGNOSIS — Y9389 Activity, other specified: Secondary | ICD-10-CM | POA: Diagnosis not present

## 2015-01-16 DIAGNOSIS — I10 Essential (primary) hypertension: Secondary | ICD-10-CM | POA: Diagnosis not present

## 2015-01-16 DIAGNOSIS — Y998 Other external cause status: Secondary | ICD-10-CM | POA: Insufficient documentation

## 2015-01-16 DIAGNOSIS — Y9289 Other specified places as the place of occurrence of the external cause: Secondary | ICD-10-CM | POA: Diagnosis not present

## 2015-01-16 DIAGNOSIS — W57XXXA Bitten or stung by nonvenomous insect and other nonvenomous arthropods, initial encounter: Secondary | ICD-10-CM | POA: Insufficient documentation

## 2015-01-16 DIAGNOSIS — Z7982 Long term (current) use of aspirin: Secondary | ICD-10-CM | POA: Diagnosis not present

## 2015-01-16 DIAGNOSIS — Z79899 Other long term (current) drug therapy: Secondary | ICD-10-CM | POA: Diagnosis not present

## 2015-01-16 DIAGNOSIS — S90861A Insect bite (nonvenomous), right foot, initial encounter: Secondary | ICD-10-CM | POA: Insufficient documentation

## 2015-01-16 DIAGNOSIS — D649 Anemia, unspecified: Secondary | ICD-10-CM | POA: Insufficient documentation

## 2015-01-16 MED ORDER — DOXYCYCLINE HYCLATE 100 MG PO CAPS: 100.0000 mg | ORAL_CAPSULE | Freq: Two times a day (BID) | ORAL | Status: DC

## 2015-01-16 NOTE — ED Notes (Signed)
Pt reports removed a tick off of the side of her right foot last week and now area has blistered.  Pt denies pain but says area itches.

## 2015-01-16 NOTE — Discharge Instructions (Signed)
Your vital signs are within normal limits. No evidence of any foreign body in your foot at this time. Please cover the blister area with a Band-Aid daily until it resolves. Please use doxycycline 2 times daily with a meal. Please see your primary physician, or return to the emergency department if any hot areas of the foot, red streaking noted, I fever, or deterioration in your general condition.

## 2015-01-16 NOTE — ED Provider Notes (Signed)
CSN: [id number]     Arrival date & time 01/16/15  1354 History   None    Chief Complaint  Patient presents with  . Tick Removal     (Consider location/radiation/quality/duration/timing/severity/associated sxs/prior Treatment) HPI Comments: Patient is a 80 year old female who presents to the emergency department for evaluation of a tick bite.  The patient states that a week ago she removed a tick from the side of her right foot. She states it appeared the tick was intact when she removed it, but later she developed a very small bump on the side of her foot. This continued to get larger and developed a blister area. There's been no drainage noted. There's been no red streaks appreciated. She has some mild itching, but no actual pain. His been no high fever reported. The been no lesions of the foot reported. His been no rash reported, no nausea or vomiting reported.  The history is provided by the patient.    Past Medical History  Diagnosis Date  . Diabetes mellitus   . Hypertension   . Anemia 10/2003   Past Surgical History  Procedure Laterality Date  . Tubal ligation  1977  . Cataract extraction  12/06/2003    left eye  . Yag laser application Right [id number]    Procedure: YAG LASER APPLICATION;  Surgeon: Elta Guadeloupe T. Gershon Crane, MD;  Location: [facility];  Service: Ophthalmology;  Laterality: Right;   Family History  Problem Relation Age of Onset  . Diabetes Mother   . Hypertension Mother   . Cancer Brother   . Diabetes Brother   . Hypertension Maternal Grandmother   . Diabetes Maternal Grandmother   . Cancer Brother   . Diabetes Brother    Social History  Substance Use Topics  . Smoking status: Never Smoker   . Smokeless tobacco: Never Used  . Alcohol Use: No   OB History    No data available     Review of Systems  Musculoskeletal: Positive for arthralgias.  Skin: Positive for wound.  All other systems reviewed and are negative.     Allergies  Review of patient's  allergies indicates no known allergies.  Home Medications   Prior to Admission medications   Medication Sig Start Date End Date Taking? Authorizing Provider  amLODipine (NORVASC) 10 MG tablet Take 10 mg by mouth daily.   Yes Historical Provider, MD  aspirin 81 MG tablet Take 81 mg by mouth daily.    Yes Historical Provider, MD  atorvastatin (LIPITOR) 40 MG tablet Take 40 mg by mouth daily.   Yes Historical Provider, MD  Calcium Carb-Cholecalciferol [phone] MG-UNIT CAPS Take 2 tablets by mouth every other day.    Yes Historical Provider, MD  darbepoetin alfa-polysorbate (ARANESP) 200 MCG/ML injection Inject 200 mcg into the skin every 6 (six) months. If needed 03/04/11 01/16/15 Yes Haynes Bast Murinson, MD  ferrous sulfate 325 (65 FE) MG tablet Take 325 mg by mouth daily with breakfast.   Yes Historical Provider, MD  glimepiride (AMARYL) 2 MG tablet Take 2 mg by mouth daily with breakfast.   Yes Historical Provider, MD  Multiple Vitamin (MULTIVITAMIN) tablet Take 1 tablet by mouth daily.   Yes Historical Provider, MD  nitroGLYCERIN (NITRODUR - DOSED IN MG/24 HR) 0.2 mg/hr Place 1 patch onto the skin daily as needed (shortness of breath).    Yes Historical Provider, MD  pioglitazone (ACTOS) 45 MG tablet Take 45 mg by mouth daily.   Yes Historical Provider, MD  Polyethyl Glycol-Propyl  Glycol (SYSTANE OP) Place 2 drops into both eyes 4 (four) times daily as needed.    Yes Historical Provider, MD  Polyethylene Glycol 3350 (MIRALAX PO) Take 17 g by mouth daily as needed.    Yes Historical Provider, MD  valsartan-hydrochlorothiazide (DIOVAN-HCT) 160-12.5 MG per tablet Take 1 tablet by mouth daily.   Yes Historical Provider, MD  vitamin E 400 UNIT capsule Take 400 Units by mouth daily.    Yes Historical Provider, MD   Pulse 84  Temp(Src) 98 F (36.7 C) (Oral)  Resp 16  Ht 5' (1.524 m)  Wt 191 lb (86.637 kg)  BMI 37.30 kg/m2  SpO2 100% Physical Exam  Constitutional: She is oriented to person, place,  and time. She appears well-developed and well-nourished.  Non-toxic appearance.  HENT:  Head: Normocephalic.  Right Ear: Tympanic membrane and external ear normal.  Left Ear: Tympanic membrane and external ear normal.  Eyes: EOM and lids are normal. Pupils are equal, round, and reactive to light.  Neck: Normal range of motion. Neck supple. Carotid bruit is not present.  Cardiovascular: Normal rate, regular rhythm, normal heart sounds, intact distal pulses and normal pulses.   Pulmonary/Chest: Breath sounds normal. No respiratory distress.  Abdominal: Soft. Bowel sounds are normal. There is no tenderness. There is no guarding.  Musculoskeletal: Normal range of motion.  There is a small been size blister to the lateral plantar surface of the right foot. There is no red streaking appreciated. There is no drainage from the blister. There no lesions of the plantar surface, or the dorsum of the foot, and there no lesions between the toes.  Lymphadenopathy:       Head (right side): No submandibular adenopathy present.       Head (left side): No submandibular adenopathy present.    She has no cervical adenopathy.  Neurological: She is alert and oriented to person, place, and time. She has normal strength. No cranial nerve deficit or sensory deficit.  Skin: Skin is warm and dry.  Psychiatric: She has a normal mood and affect. Her speech is normal.  Nursing note and vitals reviewed.   ED Course Pt seen with me by Dr Hillard Danker.  Procedures (including critical care time) Labs Review Labs Reviewed - No data to display  Imaging Review No results found. I have personally reviewed and evaluated these images and lab results as part of my medical decision-making.   EKG Interpretation None      MDM Patient sustained a tick bite last week to the right foot. She is concerned that she may have left a portion of the tick inside since it developed a blister. She is also concerned for infection because of  her diabetes. The area was illuminated with light, as well as evaluated with magnification, no foreign body noted in the right foot. The patient will be covered prophylactically with doxycycline. Patient is advised to return to the emergency department immediately, or to see the primary physician if any signs of advancing infection. The patient is in agreement with this discharge plan.    Final diagnoses:  None    **I have reviewed nursing notes, vital signs, and all appropriate lab and imaging results for this patient.Lily Kocher, PA-C 01/16/15 1528  Dorie Rank, MD 01/16/15 1556

## 2015-01-20 ENCOUNTER — Other Ambulatory Visit (HOSPITAL_COMMUNITY): Payer: Self-pay | Admitting: Internal Medicine

## 2015-01-20 DIAGNOSIS — Z1231 Encounter for screening mammogram for malignant neoplasm of breast: Secondary | ICD-10-CM

## 2015-02-22 ENCOUNTER — Ambulatory Visit (HOSPITAL_COMMUNITY)
Admission: RE | Admit: 2015-02-22 | Discharge: 2015-02-22 | Disposition: A | Discharge status: Home / Self Care | Payer: Medicare Other | Source: Ambulatory Visit | Attending: Internal Medicine | Admitting: Internal Medicine

## 2015-02-22 DIAGNOSIS — Z1231 Encounter for screening mammogram for malignant neoplasm of breast: Secondary | ICD-10-CM | POA: Diagnosis present

## 2015-02-28 ENCOUNTER — Other Ambulatory Visit: Payer: Self-pay | Admitting: Internal Medicine

## 2015-02-28 DIAGNOSIS — R928 Other abnormal and inconclusive findings on diagnostic imaging of breast: Secondary | ICD-10-CM

## 2015-03-10 ENCOUNTER — Other Ambulatory Visit (HOSPITAL_BASED_OUTPATIENT_CLINIC_OR_DEPARTMENT_OTHER): Payer: Medicare Other

## 2015-03-10 ENCOUNTER — Ambulatory Visit (HOSPITAL_BASED_OUTPATIENT_CLINIC_OR_DEPARTMENT_OTHER): Payer: Medicare Other

## 2015-03-10 VITALS — BP 123/55 | HR 74 | Temp 98.4°F

## 2015-03-10 DIAGNOSIS — D631 Anemia in chronic kidney disease: Secondary | ICD-10-CM

## 2015-03-10 DIAGNOSIS — N183 Chronic kidney disease, stage 3 unspecified: Secondary | ICD-10-CM

## 2015-03-10 DIAGNOSIS — N184 Chronic kidney disease, stage 4 (severe): Secondary | ICD-10-CM

## 2015-03-10 LAB — CBC & DIFF AND RETIC
BASO%: 0.5 % (ref 0.0–2.0)
BASOS ABS: 0 10*3/uL (ref 0.0–0.1)
EOS%: 2.4 % (ref 0.0–7.0)
Eosinophils Absolute: 0.1 10*3/uL (ref 0.0–0.5)
HEMATOCRIT: 32.9 % — AB (ref 34.8–46.6)
HEMOGLOBIN: 10.7 g/dL — AB (ref 11.6–15.9)
Immature Retic Fract: 2.3 % (ref 1.60–10.00)
LYMPH%: 24.7 % (ref 14.0–49.7)
MCH: 26.9 pg (ref 25.1–34.0)
MCHC: 32.5 g/dL (ref 31.5–36.0)
MCV: 82.7 fL (ref 79.5–101.0)
MONO#: 0.6 10*3/uL (ref 0.1–0.9)
MONO%: 14.3 % — AB (ref 0.0–14.0)
NEUT#: 2.4 10*3/uL (ref 1.5–6.5)
NEUT%: 58.1 % (ref 38.4–76.8)
Platelets: 137 10*3/uL — ABNORMAL LOW (ref 145–400)
RBC: 3.98 10*6/uL (ref 3.70–5.45)
RDW: 14.8 % — ABNORMAL HIGH (ref 11.2–14.5)
Retic %: 0.62 % — ABNORMAL LOW (ref 0.70–2.10)
Retic Ct Abs: 24.68 10*3/uL — ABNORMAL LOW (ref 33.70–90.70)
WBC: 4.1 10*3/uL (ref 3.9–10.3)
lymph#: 1 10*3/uL (ref 0.9–3.3)

## 2015-03-10 LAB — COMPREHENSIVE METABOLIC PANEL (CC13)
ALBUMIN: 3.7 g/dL (ref 3.5–5.0)
ALK PHOS: 96 U/L (ref 40–150)
ALT: 18 U/L (ref 0–55)
AST: 24 U/L (ref 5–34)
Anion Gap: 10 mEq/L (ref 3–11)
BUN: 32 mg/dL — AB (ref 7.0–26.0)
CO2: 22 mEq/L (ref 22–29)
Calcium: 9.6 mg/dL (ref 8.4–10.4)
Chloride: 109 mEq/L (ref 98–109)
Creatinine: 1.9 mg/dL — ABNORMAL HIGH (ref 0.6–1.1)
EGFR: 29 mL/min/{1.73_m2} — AB (ref >90)
Glucose: 89 mg/dl (ref 70–140)
Potassium: 4.3 mEq/L (ref 3.5–5.1)
Sodium: 141 mEq/L (ref 136–145)
Total Bilirubin: 0.37 mg/dL (ref 0.20–1.20)
Total Protein: 7.4 g/dL (ref 6.4–8.3)

## 2015-03-10 MED ORDER — DARBEPOETIN ALFA 300 MCG/0.6ML IJ SOSY
300.0000 ug | PREFILLED_SYRINGE | Freq: Once | INTRAMUSCULAR | Status: AC
  Administered 2015-03-10: 300 ug via SUBCUTANEOUS
  Filled 2015-03-10: qty 0.6

## 2015-03-14 ENCOUNTER — Ambulatory Visit (HOSPITAL_COMMUNITY)
Admission: RE | Admit: 2015-03-14 | Discharge: 2015-03-14 | Disposition: A | Discharge status: Home / Self Care | Payer: Medicare Other | Source: Ambulatory Visit | Attending: Internal Medicine | Admitting: Internal Medicine

## 2015-03-14 DIAGNOSIS — R921 Mammographic calcification found on diagnostic imaging of breast: Secondary | ICD-10-CM | POA: Insufficient documentation

## 2015-03-14 DIAGNOSIS — R928 Other abnormal and inconclusive findings on diagnostic imaging of breast: Secondary | ICD-10-CM

## 2015-07-07 ENCOUNTER — Other Ambulatory Visit (HOSPITAL_BASED_OUTPATIENT_CLINIC_OR_DEPARTMENT_OTHER): Payer: Medicare Other

## 2015-07-07 ENCOUNTER — Encounter: Payer: Self-pay | Admitting: Hematology

## 2015-07-07 ENCOUNTER — Telehealth: Payer: Self-pay | Admitting: Hematology

## 2015-07-07 ENCOUNTER — Ambulatory Visit (HOSPITAL_BASED_OUTPATIENT_CLINIC_OR_DEPARTMENT_OTHER): Payer: Medicare Other

## 2015-07-07 ENCOUNTER — Ambulatory Visit (HOSPITAL_BASED_OUTPATIENT_CLINIC_OR_DEPARTMENT_OTHER): Payer: Medicare Other | Admitting: Hematology

## 2015-07-07 VITALS — BP 129/49 | HR 98 | Temp 98.3°F | Resp 18 | Ht 60.0 in | Wt 193.7 lb

## 2015-07-07 DIAGNOSIS — N184 Chronic kidney disease, stage 4 (severe): Secondary | ICD-10-CM

## 2015-07-07 DIAGNOSIS — E119 Type 2 diabetes mellitus without complications: Secondary | ICD-10-CM

## 2015-07-07 DIAGNOSIS — N183 Chronic kidney disease, stage 3 unspecified: Secondary | ICD-10-CM

## 2015-07-07 DIAGNOSIS — D631 Anemia in chronic kidney disease: Secondary | ICD-10-CM

## 2015-07-07 DIAGNOSIS — I10 Essential (primary) hypertension: Secondary | ICD-10-CM

## 2015-07-07 LAB — COMPREHENSIVE METABOLIC PANEL
ALBUMIN: 3.8 g/dL (ref 3.5–5.0)
ALK PHOS: 91 U/L (ref 40–150)
ALT: 12 U/L (ref 0–55)
ANION GAP: 10 meq/L (ref 3–11)
AST: 18 U/L (ref 5–34)
BUN: 28.3 mg/dL — AB (ref 7.0–26.0)
CALCIUM: 9.1 mg/dL (ref 8.4–10.4)
CHLORIDE: 110 meq/L — AB (ref 98–109)
CO2: 22 mEq/L (ref 22–29)
CREATININE: 2 mg/dL — AB (ref 0.6–1.1)
EGFR: 28 mL/min/{1.73_m2} — ABNORMAL LOW (ref >90)
Glucose: 123 mg/dl (ref 70–140)
Potassium: 3.7 mEq/L (ref 3.5–5.1)
Sodium: 141 mEq/L (ref 136–145)
TOTAL PROTEIN: 7.6 g/dL (ref 6.4–8.3)
Total Bilirubin: <0.3 mg/dL (ref 0.20–1.20)

## 2015-07-07 LAB — CBC & DIFF AND RETIC
BASO%: 0.4 % (ref 0.0–2.0)
BASOS ABS: 0 10*3/uL (ref 0.0–0.1)
EOS%: 2.6 % (ref 0.0–7.0)
Eosinophils Absolute: 0.1 10*3/uL (ref 0.0–0.5)
HEMATOCRIT: 30.6 % — AB (ref 34.8–46.6)
HGB: 10 g/dL — ABNORMAL LOW (ref 11.6–15.9)
IMMATURE RETIC FRACT: 7.8 % (ref 1.60–10.00)
LYMPH%: 22.8 % (ref 14.0–49.7)
MCH: 27 pg (ref 25.1–34.0)
MCHC: 32.7 g/dL (ref 31.5–36.0)
MCV: 82.7 fL (ref 79.5–101.0)
MONO#: 0.7 10*3/uL (ref 0.1–0.9)
MONO%: 13.5 % (ref 0.0–14.0)
NEUT#: 3.1 10*3/uL (ref 1.5–6.5)
NEUT%: 60.7 % (ref 38.4–76.8)
PLATELETS: 138 10*3/uL — AB (ref 145–400)
RBC: 3.7 10*6/uL (ref 3.70–5.45)
RDW: 15.5 % — ABNORMAL HIGH (ref 11.2–14.5)
Retic %: 0.74 % (ref 0.70–2.10)
Retic Ct Abs: 27.38 10*3/uL — ABNORMAL LOW (ref 33.70–90.70)
WBC: 5 10*3/uL (ref 3.9–10.3)
lymph#: 1.2 10*3/uL (ref 0.9–3.3)

## 2015-07-07 MED ORDER — DARBEPOETIN ALFA 300 MCG/0.6ML IJ SOSY
300.0000 ug | PREFILLED_SYRINGE | Freq: Once | INTRAMUSCULAR | Status: AC
  Administered 2015-07-07: 300 ug via SUBCUTANEOUS
  Filled 2015-07-07: qty 0.6

## 2015-07-07 NOTE — Progress Notes (Signed)
[place] OFFICE PROGRESS NOTE  Shamleffer, Herschell Dimes, MD [place]  [address] [place] [address]  DIAGNOSIS: Anemia due to chronic renal failure treated with erythropoietin, stage 4 (severe) (HCC)     CHIEF COMPLAIN: follow up   CURRENT THERAPY: Aranesp 200-371mcg every 3-4 month if Hb<11g/dl   INTERVAL HISTORY: Lindsey Mcguire 81 y.o. female with a problem list consisting of:   1. Anemia secondary to renal insufficiency. Currently on Aranesp injections as needed for hemoglobin less than or equal to 11.  2. Renal insufficiency.  3. Spinal stenosis at L4-5 and L5-6 as per MRI of the lumbar spine on 06/16/2010.  4. Hypertension.  5. Diabetes mellitus type 2.  6. Systolic ejection murmur.  7. Status post cervical spine surgery by Dr. Hazle Coca in October 2010.  8. Last mammogram 01/2014  9. Last colonoscopy in 2008, next one due in 2018   INTERIM HISTORY: Lindsey Mcguire for follow-up and Aranesp injection. Lindsey Mcguire is doing very well  Overall. Lindsey Mcguire noticed her sugar has been low lately, Lindsey Mcguire'll follow-up with her endocrinologist. Lindsey Mcguire denies any significant pain, dyspnea, or other new symptoms. Sitting tolerating Aranesp injection well. Lindsey Mcguire is on baby aspirin.   MEDICAL HISTORY: Past Medical History  Diagnosis Date  . Diabetes mellitus   . Hypertension   . Anemia 10/2003    INTERIM HISTORY: has Anemia due to chronic renal failure treated with erythropoietin on her problem list.    ALLERGIES:  has No Known Allergies.  MEDICATIONS: has a current medication list which includes the following prescription(s): amlodipine, aspirin, atorvastatin, calcium carb-cholecalciferol, ferrous sulfate, glimepiride, multivitamin, nitroglycerin, pioglitazone, polyethyl glycol-propyl glycol, polyethylene glycol 3350, valsartan-hydrochlorothiazide, vitamin e, and darbepoetin alfa-polysorbate.  SURGICAL HISTORY:  Past Surgical History  Procedure Laterality Date  .  Tubal ligation  1977  . Cataract extraction  12/06/2003    left eye  . Yag laser application Right [id number]    Procedure: YAG LASER APPLICATION;  Surgeon: Lindsey Guadeloupe T. Gershon Crane, MD;  Location: [facility];  Service: Ophthalmology;  Laterality: Right;    REVIEW OF SYSTEMS:   Constitutional: Denies fevers, chills or abnormal weight loss Eyes: Denies blurriness of vision Ears, nose, mouth, throat, and face: Denies mucositis or sore throat Respiratory: Denies cough, dyspnea or wheezes Cardiovascular: Denies palpitation, chest discomfort or lower extremity swelling Gastrointestinal:  Denies nausea, heartburn or change in bowel habits Skin: Denies abnormal skin rashes Lymphatics: Denies new lymphadenopathy or easy bruising Neurological:Denies numbness, tingling or new weaknesses Behavioral/Psych: Mood is stable, no new changes  All other systems were reviewed with the patient and are negative.  PHYSICAL EXAMINATION: ECOG PERFORMANCE STATUS: 0 - Asymptomatic  Blood pressure 129/49, pulse 98, temperature 98.3 F (36.8 C), temperature source Oral, resp. rate 18, height 5' (1.524 m), weight 193 lb 11.2 oz (87.862 kg), SpO2 99 %.  GENERAL:alert, no distress and comfortable, elderly female SKIN: skin color, texture, turgor are normal, no rashes or significant lesions; multiple moles on neck and face EYES: normal, Conjunctiva are pink and non-injected, sclera clear OROPHARYNX:no exudate, no erythema and lips, buccal mucosa, and tongue normal  NECK: supple, thyroid normal size, non-tender, without nodularity LYMPH:  no palpable lymphadenopathy in the cervical, axillary or supraclavicular LUNGS: clear to auscultation and percussion with normal breathing effort HEART: regular rate & rhythm and 1/6 SEM and no lower extremity edema ABDOMEN:abdomen soft, non-tender and normal bowel sounds Musculoskeletal:no cyanosis of digits and no clubbing  NEURO: alert & oriented x 3 with  fluent speech, no focal  motor/sensory deficits   LABORATORY DATA: CBC Latest Ref Rng 07/07/2015 03/10/2015 11/07/2014  WBC 3.9 - 10.3 10e3/uL 5.0 4.1 5.2  Hemoglobin 11.6 - 15.9 g/dL 10.0(L) 10.7(L) 10.5(L)  Hematocrit 34.8 - 46.6 % 30.6(L) 32.9(L) 31.9(L)  Platelets 145 - 400 10e3/uL 138(L) 137(L) 145    CMP Latest Ref Rng 07/07/2015 03/10/2015 11/07/2014  Glucose 70 - 140 mg/dl 123 89 121  BUN 7.0 - 26.0 mg/dL 28.3(H) 32.0(H) 23.9  Creatinine 0.6 - 1.1 mg/dL 2.0(H) 1.9(H) 1.9(H)  Sodium 136 - 145 mEq/L 141 141 141  Potassium 3.5 - 5.1 mEq/L 3.7 4.3 4.1  CO2 22 - 29 mEq/L 22 22 23   Calcium 8.4 - 10.4 mg/dL 9.1 9.6 9.7  Total Protein 6.4 - 8.3 g/dL 7.6 7.4 7.6  Total Bilirubin 0.20 - 1.20 mg/dL <0.30 0.37 0.39  Alkaline Phos 40 - 150 U/L 91 96 94  AST 5 - 34 U/L 18 24 23   ALT 0 - 55 U/L 12 18 18       RADIOGRAPHIC STUDIES: 1. CT scan of the abdomen and pelvis with IV contrast on 05/15/2010 showed rectal fecal impaction without proximal bowel obstruction.  There was a small amount of adjacent free fluid in the pelvis. There is a calcified appendicolith without acute appendicitis. There was an umbilical hernia containing fat, as well as a non-incarcerated small bowel loop. The CT scan was obtained when the patient apparently went to the emergency room.  2. MRI of the lumbar spine without contrast on 06/16/2010 showed moderate spinal stenosis at L4-5 unchanged. There was moderate  spinal stenosis at L5-S1 with progression of slip at L5 on S1 with advanced facet degeneration. There was interval development of  left foraminal and left lateral disk protrusion at L5-S1 compared with the prior MRI carried out on 06/14/2007.  3. Digital screening mammograms on 02/08/2011 and 02/20/2011 showed no significant abnormalities. Special views of the left breast were carried out as part of that exam.  4. Digital screening mammogram on 02/11/2012 showed no mammographic evidence of malignancy. 5. Digital screening mammogram of  Left breast on 03/03/2013 showed no mammographic evidence of malignancy.  ASSESSMENT: Lindsey Mcguire 81 y.o. female with a history of Anemia due to chronic renal failure treated with erythropoietin, stage 4 (severe) (HCC)   PLAN:   1. Anemia due to chronic renal failure treated with erythropoietin. -her hemoglobin was 10.0 today,  Asymptomatic, will give Aranesp today. Lindsey Mcguire has been getting the injection every 4 months on average. -Side effects from Aranesp were discussed with patient, which includes but not limited to thrombosis, stroke, heart attack, etc.  Lindsey Mcguire is on baby aspirin. - we'll continue lab and Aranesp injection every 4 months  For now. If her hemoglobin drops further (<9),  I'll increase her Aranesp injection to every 3 months  2. CKD stage IV  -Lindsey Mcguire will follow-up with her nephrologist Dr. Florene Glen   3.  hypertension, diabetes, arthritis  Lindsey Mcguire will follow up with primary care physician.  FOLLOW UP: Return to clinic for CBC and Aranesp injection every 4 months, I'll see her back in 8 months  All questions were answered. The patient knows to call the clinic with any problems, questions or concerns. We can certainly see the patient much sooner if necessary.  I spent 10 minutes counseling the patient face to face. The total time spent in the appointment was 15 minutes.    Truitt Merle, MD 07/07/2015 10:57 AM

## 2015-07-07 NOTE — Telephone Encounter (Signed)
Gave and printed appt sched and avs for pt for July and NOV

## 2015-08-25 ENCOUNTER — Other Ambulatory Visit (HOSPITAL_COMMUNITY): Payer: Self-pay | Admitting: Internal Medicine

## 2015-08-25 DIAGNOSIS — Z09 Encounter for follow-up examination after completed treatment for conditions other than malignant neoplasm: Secondary | ICD-10-CM

## 2015-08-25 DIAGNOSIS — R921 Mammographic calcification found on diagnostic imaging of breast: Secondary | ICD-10-CM

## 2015-08-29 ENCOUNTER — Encounter (HOSPITAL_COMMUNITY): Payer: Medicare Other

## 2015-09-12 ENCOUNTER — Ambulatory Visit (HOSPITAL_COMMUNITY)
Admission: RE | Admit: 2015-09-12 | Discharge: 2015-09-12 | Disposition: A | Discharge status: Home / Self Care | Payer: Medicare Other | Source: Ambulatory Visit | Attending: Internal Medicine | Admitting: Internal Medicine

## 2015-09-12 DIAGNOSIS — R921 Mammographic calcification found on diagnostic imaging of breast: Secondary | ICD-10-CM

## 2015-09-12 DIAGNOSIS — Z09 Encounter for follow-up examination after completed treatment for conditions other than malignant neoplasm: Secondary | ICD-10-CM

## 2015-11-02 ENCOUNTER — Ambulatory Visit (HOSPITAL_BASED_OUTPATIENT_CLINIC_OR_DEPARTMENT_OTHER): Payer: Medicare Other

## 2015-11-02 ENCOUNTER — Other Ambulatory Visit (HOSPITAL_BASED_OUTPATIENT_CLINIC_OR_DEPARTMENT_OTHER): Payer: Medicare Other

## 2015-11-02 VITALS — BP 124/53 | HR 79 | Temp 97.8°F | Resp 20

## 2015-11-02 DIAGNOSIS — D631 Anemia in chronic kidney disease: Secondary | ICD-10-CM

## 2015-11-02 DIAGNOSIS — N184 Chronic kidney disease, stage 4 (severe): Secondary | ICD-10-CM | POA: Diagnosis not present

## 2015-11-02 DIAGNOSIS — N183 Chronic kidney disease, stage 3 (moderate): Principal | ICD-10-CM

## 2015-11-02 LAB — CBC & DIFF AND RETIC
BASO%: 0.7 % (ref 0.0–2.0)
BASOS ABS: 0 10*3/uL (ref 0.0–0.1)
EOS ABS: 0.1 10*3/uL (ref 0.0–0.5)
EOS%: 2.7 % (ref 0.0–7.0)
HEMATOCRIT: 29.6 % — AB (ref 34.8–46.6)
HEMOGLOBIN: 9.7 g/dL — AB (ref 11.6–15.9)
IMMATURE RETIC FRACT: 5 % (ref 1.60–10.00)
LYMPH#: 1 10*3/uL (ref 0.9–3.3)
LYMPH%: 24.4 % (ref 14.0–49.7)
MCH: 26.7 pg (ref 25.1–34.0)
MCHC: 32.8 g/dL (ref 31.5–36.0)
MCV: 81.5 fL (ref 79.5–101.0)
MONO#: 0.7 10*3/uL (ref 0.1–0.9)
MONO%: 17.9 % — AB (ref 0.0–14.0)
NEUT%: 54.3 % (ref 38.4–76.8)
NEUTROS ABS: 2.3 10*3/uL (ref 1.5–6.5)
Platelets: 128 10*3/uL — ABNORMAL LOW (ref 145–400)
RBC: 3.63 10*6/uL — ABNORMAL LOW (ref 3.70–5.45)
RDW: 16 % — AB (ref 11.2–14.5)
RETIC %: 0.74 % (ref 0.70–2.10)
RETIC CT ABS: 26.86 10*3/uL — AB (ref 33.70–90.70)
WBC: 4.1 10*3/uL (ref 3.9–10.3)

## 2015-11-02 LAB — COMPREHENSIVE METABOLIC PANEL
ALBUMIN: 3.9 g/dL (ref 3.5–5.0)
ALT: 18 U/L (ref 0–55)
AST: 21 U/L (ref 5–34)
Alkaline Phosphatase: 88 U/L (ref 40–150)
Anion Gap: 9 mEq/L (ref 3–11)
BILIRUBIN TOTAL: 0.33 mg/dL (ref 0.20–1.20)
BUN: 37.8 mg/dL — AB (ref 7.0–26.0)
CO2: 24 meq/L (ref 22–29)
Calcium: 9.3 mg/dL (ref 8.4–10.4)
Chloride: 107 mEq/L (ref 98–109)
Creatinine: 1.9 mg/dL — ABNORMAL HIGH (ref 0.6–1.1)
EGFR: 28 mL/min/{1.73_m2} — AB (ref >90)
GLUCOSE: 136 mg/dL (ref 70–140)
Potassium: 4.2 mEq/L (ref 3.5–5.1)
SODIUM: 141 meq/L (ref 136–145)
TOTAL PROTEIN: 7.6 g/dL (ref 6.4–8.3)

## 2015-11-02 MED ORDER — DARBEPOETIN ALFA 300 MCG/0.6ML IJ SOSY
300.0000 ug | PREFILLED_SYRINGE | Freq: Once | INTRAMUSCULAR | Status: AC
Start: 2015-11-02 — End: 2015-11-02
  Administered 2015-11-02: 300 ug via SUBCUTANEOUS
  Filled 2015-11-02: qty 0.6

## 2015-11-02 NOTE — Patient Instructions (Signed)
Darbepoetin Alfa injection What is this medicine? DARBEPOETIN ALFA (dar be POE e tin AL fa) helps your body make more red blood cells. It is used to treat anemia caused by chronic kidney failure and chemotherapy. This medicine may be used for other purposes; ask your health care provider or pharmacist if you have questions. COMMON BRAND NAME(S): Aranesp What should I tell my health care provider before I take this medicine? They need to know if you have any of these conditions: -blood clotting disorders or history of blood clots -cancer patient not on chemotherapy -cystic fibrosis -heart disease, such as angina, heart failure, or a history of a heart attack -hemoglobin level of 12 g/dL or greater -high blood pressure -low levels of folate, iron, or vitamin B12 -seizures -an unusual or allergic reaction to darbepoetin, erythropoietin, albumin, hamster proteins, latex, other medicines, foods, dyes, or preservatives -pregnant or trying to get pregnant -breast-feeding How should I use this medicine? This medicine is for injection into a vein or under the skin. It is usually given by a health care professional in a hospital or clinic setting. If you get this medicine at home, you will be taught how to prepare and give this medicine. Do not shake the solution before you withdraw a dose. Use exactly as directed. Take your medicine at regular intervals. Do not take your medicine more often than directed. It is important that you put your used needles and syringes in a special sharps container. Do not put them in a trash can. If you do not have a sharps container, call your pharmacist or healthcare provider to get one. Talk to your pediatrician regarding the use of this medicine in children. While this medicine may be used in children as young as 1 year for selected conditions, precautions do apply. Overdosage: If you think you have taken too much of this medicine contact a poison control center or  emergency room at once. NOTE: This medicine is only for you. Do not share this medicine with others. What if I miss a dose? If you miss a dose, take it as soon as you can. If it is almost time for your next dose, take only that dose. Do not take double or extra doses. What may interact with this medicine? Do not take this medicine with any of the following medications: -epoetin alfa This list may not describe all possible interactions. Give your health care provider a list of all the medicines, herbs, non-prescription drugs, or dietary supplements you use. Also tell them if you smoke, drink alcohol, or use illegal drugs. Some items may interact with your medicine. What should I watch for while using this medicine? Visit your prescriber or health care professional for regular checks on your progress and for the needed blood tests and blood pressure measurements. It is especially important for the doctor to make sure your hemoglobin level is in the desired range, to limit the risk of potential side effects and to give you the best benefit. Keep all appointments for any recommended tests. Check your blood pressure as directed. Ask your doctor what your blood pressure should be and when you should contact him or her. As your body makes more red blood cells, you may need to take iron, folic acid, or vitamin B supplements. Ask your doctor or health care provider which products are right for you. If you have kidney disease continue dietary restrictions, even though this medication can make you feel better. Talk with your doctor or health   care professional about the foods you eat and the vitamins that you take. What side effects may I notice from receiving this medicine? Side effects that you should report to your doctor or health care professional as soon as possible: -allergic reactions like skin rash, itching or hives, swelling of the face, lips, or tongue -breathing problems -changes in vision -chest  pain -confusion, trouble speaking or understanding -feeling faint or lightheaded, falls -high blood pressure -muscle aches or pains -pain, swelling, warmth in the leg -rapid weight gain -severe headaches -sudden numbness or weakness of the face, arm or leg -trouble walking, dizziness, loss of balance or coordination -seizures (convulsions) -swelling of the ankles, feet, hands -unusually weak or tired Side effects that usually do not require medical attention (report to your doctor or health care professional if they continue or are bothersome): -diarrhea -fever, chills (flu-like symptoms) -headaches -nausea, vomiting -redness, stinging, or swelling at site where injected This list may not describe all possible side effects. Call your doctor for medical advice about side effects. You may report side effects to FDA at 1-800-FDA-1088. Where should I keep my medicine? Keep out of the reach of children. Store in a refrigerator between 2 and 8 degrees C (36 and 46 degrees F). Do not freeze. Do not shake. Throw away any unused portion if using a single-dose vial. Throw away any unused medicine after the expiration date. NOTE: This sheet is a summary. It may not cover all possible information. If you have questions about this medicine, talk to your doctor, pharmacist, or health care provider.  2015, [employer]. (2008-03-29 10:23:57)  

## 2016-02-15 ENCOUNTER — Other Ambulatory Visit (HOSPITAL_COMMUNITY): Payer: Self-pay | Admitting: Internal Medicine

## 2016-02-15 DIAGNOSIS — Z09 Encounter for follow-up examination after completed treatment for conditions other than malignant neoplasm: Secondary | ICD-10-CM

## 2016-02-15 DIAGNOSIS — R921 Mammographic calcification found on diagnostic imaging of breast: Secondary | ICD-10-CM

## 2016-02-27 ENCOUNTER — Ambulatory Visit (HOSPITAL_COMMUNITY)
Admission: RE | Admit: 2016-02-27 | Discharge: 2016-02-27 | Disposition: A | Discharge status: Home / Self Care | Payer: Medicare Other | Source: Ambulatory Visit | Attending: Internal Medicine | Admitting: Internal Medicine

## 2016-02-27 DIAGNOSIS — R921 Mammographic calcification found on diagnostic imaging of breast: Secondary | ICD-10-CM | POA: Insufficient documentation

## 2016-02-27 DIAGNOSIS — Z09 Encounter for follow-up examination after completed treatment for conditions other than malignant neoplasm: Secondary | ICD-10-CM | POA: Insufficient documentation

## 2016-02-29 ENCOUNTER — Encounter: Payer: Self-pay | Admitting: Hematology

## 2016-02-29 ENCOUNTER — Ambulatory Visit (HOSPITAL_BASED_OUTPATIENT_CLINIC_OR_DEPARTMENT_OTHER): Payer: Medicare Other | Admitting: Hematology

## 2016-02-29 ENCOUNTER — Telehealth: Payer: Self-pay | Admitting: Hematology

## 2016-02-29 ENCOUNTER — Other Ambulatory Visit (HOSPITAL_BASED_OUTPATIENT_CLINIC_OR_DEPARTMENT_OTHER): Payer: Medicare Other

## 2016-02-29 ENCOUNTER — Ambulatory Visit (HOSPITAL_BASED_OUTPATIENT_CLINIC_OR_DEPARTMENT_OTHER): Payer: Medicare Other

## 2016-02-29 VITALS — BP 106/50 | HR 90 | Temp 98.4°F | Resp 18 | Ht 60.0 in | Wt 186.2 lb

## 2016-02-29 DIAGNOSIS — D631 Anemia in chronic kidney disease: Secondary | ICD-10-CM

## 2016-02-29 DIAGNOSIS — N183 Chronic kidney disease, stage 3 unspecified: Secondary | ICD-10-CM

## 2016-02-29 DIAGNOSIS — I10 Essential (primary) hypertension: Secondary | ICD-10-CM

## 2016-02-29 DIAGNOSIS — N184 Chronic kidney disease, stage 4 (severe): Secondary | ICD-10-CM

## 2016-02-29 DIAGNOSIS — E119 Type 2 diabetes mellitus without complications: Secondary | ICD-10-CM

## 2016-02-29 LAB — IRON AND TIBC
%SAT: 35 % (ref 21–57)
Iron: 99 ug/dL (ref 41–142)
TIBC: 283 ug/dL (ref 236–444)
UIBC: 183 ug/dL (ref 120–384)

## 2016-02-29 LAB — CBC & DIFF AND RETIC
BASO%: 0.2 % (ref 0.0–2.0)
Basophils Absolute: 0 10*3/uL (ref 0.0–0.1)
EOS%: 2.1 % (ref 0.0–7.0)
Eosinophils Absolute: 0.1 10*3/uL (ref 0.0–0.5)
HCT: 32 % — ABNORMAL LOW (ref 34.8–46.6)
HGB: 10.5 g/dL — ABNORMAL LOW (ref 11.6–15.9)
Immature Retic Fract: 3.3 % (ref 1.60–10.00)
LYMPH#: 1 10*3/uL (ref 0.9–3.3)
LYMPH%: 23.5 % (ref 14.0–49.7)
MCH: 26.5 pg (ref 25.1–34.0)
MCHC: 32.8 g/dL (ref 31.5–36.0)
MCV: 80.8 fL (ref 79.5–101.0)
MONO#: 0.6 10*3/uL (ref 0.1–0.9)
MONO%: 13.7 % (ref 0.0–14.0)
NEUT%: 60.5 % (ref 38.4–76.8)
NEUTROS ABS: 2.6 10*3/uL (ref 1.5–6.5)
Platelets: 131 10*3/uL — ABNORMAL LOW (ref 145–400)
RBC: 3.96 10*6/uL (ref 3.70–5.45)
RDW: 15 % — AB (ref 11.2–14.5)
RETIC %: 0.7 % (ref 0.70–2.10)
RETIC CT ABS: 27.72 10*3/uL — AB (ref 33.70–90.70)
WBC: 4.2 10*3/uL (ref 3.9–10.3)

## 2016-02-29 LAB — COMPREHENSIVE METABOLIC PANEL
ALT: 14 U/L (ref 0–55)
AST: 19 U/L (ref 5–34)
Albumin: 3.9 g/dL (ref 3.5–5.0)
Alkaline Phosphatase: 96 U/L (ref 40–150)
Anion Gap: 9 mEq/L (ref 3–11)
BUN: 33.9 mg/dL — AB (ref 7.0–26.0)
CHLORIDE: 107 meq/L (ref 98–109)
CO2: 23 meq/L (ref 22–29)
CREATININE: 2.3 mg/dL — AB (ref 0.6–1.1)
Calcium: 9.5 mg/dL (ref 8.4–10.4)
EGFR: 23 mL/min/{1.73_m2} — ABNORMAL LOW (ref >90)
Glucose: 143 mg/dl — ABNORMAL HIGH (ref 70–140)
POTASSIUM: 3.7 meq/L (ref 3.5–5.1)
Sodium: 139 mEq/L (ref 136–145)
Total Bilirubin: 0.39 mg/dL (ref 0.20–1.20)
Total Protein: 7.9 g/dL (ref 6.4–8.3)

## 2016-02-29 LAB — FERRITIN: Ferritin: 264 ng/ml (ref 9–269)

## 2016-02-29 MED ORDER — DARBEPOETIN ALFA 300 MCG/0.6ML IJ SOSY
300.0000 ug | PREFILLED_SYRINGE | Freq: Once | INTRAMUSCULAR | Status: AC
  Administered 2016-02-29: 300 ug via SUBCUTANEOUS
  Filled 2016-02-29: qty 0.6

## 2016-02-29 NOTE — Progress Notes (Signed)
[place] OFFICE PROGRESS NOTE  Leeroy Cha, MD [place] [address] [place] [address]  DIAGNOSIS: Anemia due to chronic renal failure treated with erythropoietin, stage 4 (severe) ([facility]) - Plan: Comprehensive metabolic panel, Ferritin, Iron and TIBC  Anemia due to chronic renal failure treated with erythropoietin, stage 3 (moderate) - Plan: DISCONTINUED: Darbepoetin Alfa (ARANESP) injection 300 mcg     CHIEF COMPLAIN: follow up   CURRENT THERAPY: Aranesp 200-354mcg every 3-4 month if Hb<11g/dl   INTERVAL HISTORY: INDICA MARCOTT 81 y.o. female with a problem list consisting of:   1. Anemia secondary to renal insufficiency. Currently on Aranesp injections as needed for hemoglobin less than or equal to 11.  2. Renal insufficiency.  3. Spinal stenosis at L4-5 and L5-6 as per MRI of the lumbar spine on 06/16/2010.  4. Hypertension.  5. Diabetes mellitus type 2.  6. Systolic ejection murmur.  7. Status post cervical spine surgery by Dr. Hazle Coca in October 2010.  8. Last mammogram 01/2014  9. Last colonoscopy in 2008, next one due in 2018   INTERIM HISTORY: Marianny returns for follow-up and Aranesp injection. She is doing well overall, has good appetite and energy level, denies chest pain or dyspnea. No reason to ED visit or hospitalization.    MEDICAL HISTORY: Past Medical History:  Diagnosis Date  . Anemia 10/2003  . Diabetes mellitus   . Hypertension     INTERIM HISTORY: has Anemia due to chronic renal failure treated with erythropoietin on her problem list.    ALLERGIES:  has No Known Allergies.  MEDICATIONS: has a current medication list which includes the following prescription(s): amlodipine, aspirin, atorvastatin, calcium carb-cholecalciferol, darbepoetin alfa-polysorbate, ferrous sulfate, glimepiride, multivitamin, nitroglycerin, pioglitazone, polyethyl glycol-propyl glycol, polyethylene glycol 3350,  valsartan-hydrochlorothiazide, and vitamin e.  SURGICAL HISTORY:  Past Surgical History:  Procedure Laterality Date  . CATARACT EXTRACTION  12/06/2003   left eye  . TUBAL LIGATION  1977  . YAG LASER APPLICATION Right 3/00/9233   Procedure: YAG LASER APPLICATION;  Surgeon: Elta Guadeloupe T. Gershon Crane, MD;  Location: [facility];  Service: Ophthalmology;  Laterality: Right;    REVIEW OF SYSTEMS:   Constitutional: Denies fevers, chills or abnormal weight loss Eyes: Denies blurriness of vision Ears, nose, mouth, throat, and face: Denies mucositis or sore throat Respiratory: Denies cough, dyspnea or wheezes Cardiovascular: Denies palpitation, chest discomfort or lower extremity swelling Gastrointestinal:  Denies nausea, heartburn or change in bowel habits Skin: Denies abnormal skin rashes Lymphatics: Denies new lymphadenopathy or easy bruising Neurological:Denies numbness, tingling or new weaknesses Behavioral/Psych: Mood is stable, no new changes  All other systems were reviewed with the patient and are negative.  PHYSICAL EXAMINATION: ECOG PERFORMANCE STATUS: 0 - Asymptomatic  Blood pressure (!) 106/50, pulse 90, temperature 98.4 F (36.9 C), temperature source Oral, resp. rate 18, height 5' (1.524 m), weight 186 lb 3.2 oz (84.5 kg), SpO2 99 %.  GENERAL:alert, no distress and comfortable, elderly female SKIN: skin color, texture, turgor are normal, no rashes or significant lesions; multiple moles on neck and face EYES: normal, Conjunctiva are pink and non-injected, sclera clear OROPHARYNX:no exudate, no erythema and lips, buccal mucosa, and tongue normal  NECK: supple, thyroid normal size, non-tender, without nodularity LYMPH:  no palpable lymphadenopathy in the cervical, axillary or supraclavicular LUNGS: clear to auscultation and percussion with normal breathing effort HEART: regular rate & rhythm and 1/6 SEM and no lower extremity edema ABDOMEN:abdomen soft, non-tender and normal bowel  sounds Musculoskeletal:no cyanosis  of digits and no clubbing  NEURO: alert & oriented x 3 with fluent speech, no focal motor/sensory deficits   LABORATORY DATA: CBC Latest Ref Rng & Units 02/29/2016 11/02/2015 07/07/2015  WBC 3.9 - 10.3 10e3/uL 4.2 4.1 5.0  Hemoglobin 11.6 - 15.9 g/dL 10.5(L) 9.7(L) 10.0(L)  Hematocrit 34.8 - 46.6 % 32.0(L) 29.6(L) 30.6(L)  Platelets 145 - 400 10e3/uL 131(L) 128(L) 138(L)    CMP Latest Ref Rng & Units 02/29/2016 11/02/2015 07/07/2015  Glucose 70 - 140 mg/dl 143(H) 136 123  BUN 7.0 - 26.0 mg/dL 33.9(H) 37.8(H) 28.3(H)  Creatinine 0.6 - 1.1 mg/dL 2.3(H) 1.9(H) 2.0(H)  Sodium 136 - 145 mEq/L 139 141 141  Potassium 3.5 - 5.1 mEq/L 3.7 4.2 3.7  Chloride 98 - 107 mEq/L - - -  CO2 22 - 29 mEq/L 23 24 22   Calcium 8.4 - 10.4 mg/dL 9.5 9.3 9.1  Total Protein 6.4 - 8.3 g/dL 7.9 7.6 7.6  Total Bilirubin 0.20 - 1.20 mg/dL 0.39 0.33 <0.30  Alkaline Phos 40 - 150 U/L 96 88 91  AST 5 - 34 U/L 19 21 18   ALT 0 - 55 U/L 14 18 12       RADIOGRAPHIC STUDIES: 1. CT scan of the abdomen and pelvis with IV contrast on 05/15/2010 showed rectal fecal impaction without proximal bowel obstruction.  There was a small amount of adjacent free fluid in the pelvis. There is a calcified appendicolith without acute appendicitis. There was an umbilical hernia containing fat, as well as a non-incarcerated small bowel loop. The CT scan was obtained when the patient apparently went to the emergency room.  2. MRI of the lumbar spine without contrast on 06/16/2010 showed moderate spinal stenosis at L4-5 unchanged. There was moderate  spinal stenosis at L5-S1 with progression of slip at L5 on S1 with advanced facet degeneration. There was interval development of  left foraminal and left lateral disk protrusion at L5-S1 compared with the prior MRI carried out on 06/14/2007.  3. Digital screening mammograms on 02/08/2011 and 02/20/2011 showed no significant abnormalities. Special views of the left  breast were carried out as part of that exam.  4. Digital screening mammogram on 02/11/2012 showed no mammographic evidence of malignancy. 5. Digital screening mammogram of Left breast on 03/03/2013 showed no mammographic evidence of malignancy.  ASSESSMENT: Lindsey Mcguire 81 y.o. female with a history of Anemia due to chronic renal failure treated with erythropoietin, stage 4 (severe) ([facility]) - Plan: Comprehensive metabolic panel, Ferritin, Iron and TIBC  Anemia due to chronic renal failure treated with erythropoietin, stage 3 (moderate) - Plan: DISCONTINUED: Darbepoetin Alfa (ARANESP) injection 300 mcg   PLAN:   1. Anemia due to chronic renal failure treated with erythropoietin. -her hemoglobin was 10.5 today,  Asymptomatic, will give Aranesp today. She has been getting the injection every 4 months on average. -Side effects from Aranesp were discussed with patient, which includes but not limited to thrombosis, stroke, heart attack, etc.  She is on baby aspirin. - we'll continue lab and Aranesp injection every 4 months  For now. If her hemoglobin drops further (<9),  I'll increase her Aranesp injection to every 3 months -I'll decrease her Aranesp from 300 to 269mcg due to her well controlled anemia   2. CKD stage IV  -She will follow-up with her nephrologist Dr. Florene Glen   3.  hypertension, diabetes, arthritis  She will follow up with primary care physician.  FOLLOW UP: Return to clinic for CBC and Aranesp injection every  4 months, I'll see her back in 8 months  All questions were answered. The patient knows to call the clinic with any problems, questions or concerns. We can certainly see the patient much sooner if necessary.  I spent 10 minutes counseling the patient face to face. The total time spent in the appointment was 15 minutes.    Truitt Merle, MD 02/29/16 5:01 PM

## 2016-02-29 NOTE — Telephone Encounter (Signed)
Appointments scheduled per 02/29/16 los. AVS report and appointment schedule given to patient, per 02/29/16 los.

## 2016-02-29 NOTE — Patient Instructions (Signed)
Darbepoetin Alfa injection What is this medicine? DARBEPOETIN ALFA (dar be POE e tin AL fa) helps your body make more red blood cells. It is used to treat anemia caused by chronic kidney failure and chemotherapy. This medicine may be used for other purposes; ask your health care provider or pharmacist if you have questions. COMMON BRAND NAME(S): Aranesp What should I tell my health care provider before I take this medicine? They need to know if you have any of these conditions: -blood clotting disorders or history of blood clots -cancer patient not on chemotherapy -cystic fibrosis -heart disease, such as angina, heart failure, or a history of a heart attack -hemoglobin level of 12 g/dL or greater -high blood pressure -low levels of folate, iron, or vitamin B12 -seizures -an unusual or allergic reaction to darbepoetin, erythropoietin, albumin, hamster proteins, latex, other medicines, foods, dyes, or preservatives -pregnant or trying to get pregnant -breast-feeding How should I use this medicine? This medicine is for injection into a vein or under the skin. It is usually given by a health care professional in a hospital or clinic setting. If you get this medicine at home, you will be taught how to prepare and give this medicine. Do not shake the solution before you withdraw a dose. Use exactly as directed. Take your medicine at regular intervals. Do not take your medicine more often than directed. It is important that you put your used needles and syringes in a special sharps container. Do not put them in a trash can. If you do not have a sharps container, call your pharmacist or healthcare provider to get one. Talk to your pediatrician regarding the use of this medicine in children. While this medicine may be used in children as young as 1 year for selected conditions, precautions do apply. Overdosage: If you think you have taken too much of this medicine contact a poison control center or  emergency room at once. NOTE: This medicine is only for you. Do not share this medicine with others. What if I miss a dose? If you miss a dose, take it as soon as you can. If it is almost time for your next dose, take only that dose. Do not take double or extra doses. What may interact with this medicine? Do not take this medicine with any of the following medications: -epoetin alfa This list may not describe all possible interactions. Give your health care provider a list of all the medicines, herbs, non-prescription drugs, or dietary supplements you use. Also tell them if you smoke, drink alcohol, or use illegal drugs. Some items may interact with your medicine. What should I watch for while using this medicine? Visit your prescriber or health care professional for regular checks on your progress and for the needed blood tests and blood pressure measurements. It is especially important for the doctor to make sure your hemoglobin level is in the desired range, to limit the risk of potential side effects and to give you the best benefit. Keep all appointments for any recommended tests. Check your blood pressure as directed. Ask your doctor what your blood pressure should be and when you should contact him or her. As your body makes more red blood cells, you may need to take iron, folic acid, or vitamin B supplements. Ask your doctor or health care provider which products are right for you. If you have kidney disease continue dietary restrictions, even though this medication can make you feel better. Talk with your doctor or health   care professional about the foods you eat and the vitamins that you take. What side effects may I notice from receiving this medicine? Side effects that you should report to your doctor or health care professional as soon as possible: -allergic reactions like skin rash, itching or hives, swelling of the face, lips, or tongue -breathing problems -changes in vision -chest  pain -confusion, trouble speaking or understanding -feeling faint or lightheaded, falls -high blood pressure -muscle aches or pains -pain, swelling, warmth in the leg -rapid weight gain -severe headaches -sudden numbness or weakness of the face, arm or leg -trouble walking, dizziness, loss of balance or coordination -seizures (convulsions) -swelling of the ankles, feet, hands -unusually weak or tired Side effects that usually do not require medical attention (report to your doctor or health care professional if they continue or are bothersome): -diarrhea -fever, chills (flu-like symptoms) -headaches -nausea, vomiting -redness, stinging, or swelling at site where injected This list may not describe all possible side effects. Call your doctor for medical advice about side effects. You may report side effects to FDA at 1-800-FDA-1088. Where should I keep my medicine? Keep out of the reach of children. Store in a refrigerator between 2 and 8 degrees C (36 and 46 degrees F). Do not freeze. Do not shake. Throw away any unused portion if using a single-dose vial. Throw away any unused medicine after the expiration date. NOTE: This sheet is a summary. It may not cover all possible information. If you have questions about this medicine, talk to your doctor, pharmacist, or health care provider.  2015, [employer]. (2008-03-29 10:23:57)  

## 2016-06-27 ENCOUNTER — Other Ambulatory Visit: Payer: Self-pay | Admitting: Hematology

## 2016-06-27 ENCOUNTER — Ambulatory Visit (HOSPITAL_BASED_OUTPATIENT_CLINIC_OR_DEPARTMENT_OTHER): Payer: Medicare Other

## 2016-06-27 ENCOUNTER — Other Ambulatory Visit (HOSPITAL_BASED_OUTPATIENT_CLINIC_OR_DEPARTMENT_OTHER): Payer: Medicare Other

## 2016-06-27 VITALS — BP 120/50 | HR 85 | Temp 97.8°F | Resp 18

## 2016-06-27 DIAGNOSIS — N183 Chronic kidney disease, stage 3 unspecified: Secondary | ICD-10-CM

## 2016-06-27 DIAGNOSIS — N184 Chronic kidney disease, stage 4 (severe): Secondary | ICD-10-CM

## 2016-06-27 DIAGNOSIS — D631 Anemia in chronic kidney disease: Secondary | ICD-10-CM

## 2016-06-27 LAB — COMPREHENSIVE METABOLIC PANEL
ALBUMIN: 3.8 g/dL (ref 3.5–5.0)
ALT: 10 U/L (ref 0–55)
ANION GAP: 9 meq/L (ref 3–11)
AST: 16 U/L (ref 5–34)
Alkaline Phosphatase: 72 U/L (ref 40–150)
BILIRUBIN TOTAL: 0.37 mg/dL (ref 0.20–1.20)
BUN: 32.8 mg/dL — ABNORMAL HIGH (ref 7.0–26.0)
CALCIUM: 10 mg/dL (ref 8.4–10.4)
CO2: 24 mEq/L (ref 22–29)
Chloride: 107 mEq/L (ref 98–109)
Creatinine: 2 mg/dL — ABNORMAL HIGH (ref 0.6–1.1)
EGFR: 27 mL/min/{1.73_m2} — AB (ref >90)
GLUCOSE: 138 mg/dL (ref 70–140)
POTASSIUM: 3.8 meq/L (ref 3.5–5.1)
SODIUM: 140 meq/L (ref 136–145)
Total Protein: 7.3 g/dL (ref 6.4–8.3)

## 2016-06-27 LAB — CBC WITH DIFFERENTIAL/PLATELET
BASO%: 0.6 % (ref 0.0–2.0)
BASOS ABS: 0 10*3/uL (ref 0.0–0.1)
EOS ABS: 0.1 10*3/uL (ref 0.0–0.5)
EOS%: 2 % (ref 0.0–7.0)
HEMATOCRIT: 30.1 % — AB (ref 34.8–46.6)
HEMOGLOBIN: 9.9 g/dL — AB (ref 11.6–15.9)
LYMPH%: 22.3 % (ref 14.0–49.7)
MCH: 27.1 pg (ref 25.1–34.0)
MCHC: 32.7 g/dL (ref 31.5–36.0)
MCV: 82.7 fL (ref 79.5–101.0)
MONO#: 0.5 10*3/uL (ref 0.1–0.9)
MONO%: 11.8 % (ref 0.0–14.0)
NEUT%: 63.3 % (ref 38.4–76.8)
NEUTROS ABS: 2.5 10*3/uL (ref 1.5–6.5)
PLATELETS: 111 10*3/uL — AB (ref 145–400)
RBC: 3.64 10*6/uL — ABNORMAL LOW (ref 3.70–5.45)
RDW: 15.1 % — AB (ref 11.2–14.5)
WBC: 4 10*3/uL (ref 3.9–10.3)
lymph#: 0.9 10*3/uL (ref 0.9–3.3)

## 2016-06-27 LAB — IRON AND TIBC
%SAT: 36 % (ref 21–57)
IRON: 92 ug/dL (ref 41–142)
TIBC: 251 ug/dL (ref 236–444)
UIBC: 160 ug/dL (ref 120–384)

## 2016-06-27 LAB — FERRITIN: FERRITIN: 324 ng/mL — AB (ref 9–269)

## 2016-06-27 MED ORDER — DARBEPOETIN ALFA 200 MCG/0.4ML IJ SOSY
200.0000 ug | PREFILLED_SYRINGE | Freq: Once | INTRAMUSCULAR | Status: AC
  Administered 2016-06-27: 200 ug via SUBCUTANEOUS
  Filled 2016-06-27: qty 0.4

## 2016-06-27 NOTE — Patient Instructions (Signed)
Darbepoetin Alfa injection What is this medicine? DARBEPOETIN ALFA (dar be POE e tin AL fa) helps your body make more red blood cells. It is used to treat anemia caused by chronic kidney failure and chemotherapy. This medicine may be used for other purposes; ask your health care provider or pharmacist if you have questions. COMMON BRAND NAME(S): Aranesp What should I tell my health care provider before I take this medicine? They need to know if you have any of these conditions: -blood clotting disorders or history of blood clots -cancer patient not on chemotherapy -cystic fibrosis -heart disease, such as angina, heart failure, or a history of a heart attack -hemoglobin level of 12 g/dL or greater -high blood pressure -low levels of folate, iron, or vitamin B12 -seizures -an unusual or allergic reaction to darbepoetin, erythropoietin, albumin, hamster proteins, latex, other medicines, foods, dyes, or preservatives -pregnant or trying to get pregnant -breast-feeding How should I use this medicine? This medicine is for injection into a vein or under the skin. It is usually given by a health care professional in a hospital or clinic setting. If you get this medicine at home, you will be taught how to prepare and give this medicine. Do not shake the solution before you withdraw a dose. Use exactly as directed. Take your medicine at regular intervals. Do not take your medicine more often than directed. It is important that you put your used needles and syringes in a special sharps container. Do not put them in a trash can. If you do not have a sharps container, call your pharmacist or healthcare provider to get one. Talk to your pediatrician regarding the use of this medicine in children. While this medicine may be used in children as young as 1 year for selected conditions, precautions do apply. Overdosage: If you think you have taken too much of this medicine contact a poison control center or  emergency room at once. NOTE: This medicine is only for you. Do not share this medicine with others. What if I miss a dose? If you miss a dose, take it as soon as you can. If it is almost time for your next dose, take only that dose. Do not take double or extra doses. What may interact with this medicine? Do not take this medicine with any of the following medications: -epoetin alfa This list may not describe all possible interactions. Give your health care provider a list of all the medicines, herbs, non-prescription drugs, or dietary supplements you use. Also tell them if you smoke, drink alcohol, or use illegal drugs. Some items may interact with your medicine. What should I watch for while using this medicine? Visit your prescriber or health care professional for regular checks on your progress and for the needed blood tests and blood pressure measurements. It is especially important for the doctor to make sure your hemoglobin level is in the desired range, to limit the risk of potential side effects and to give you the best benefit. Keep all appointments for any recommended tests. Check your blood pressure as directed. Ask your doctor what your blood pressure should be and when you should contact him or her. As your body makes more red blood cells, you may need to take iron, folic acid, or vitamin B supplements. Ask your doctor or health care provider which products are right for you. If you have kidney disease continue dietary restrictions, even though this medication can make you feel better. Talk with your doctor or health   care professional about the foods you eat and the vitamins that you take. What side effects may I notice from receiving this medicine? Side effects that you should report to your doctor or health care professional as soon as possible: -allergic reactions like skin rash, itching or hives, swelling of the face, lips, or tongue -breathing problems -changes in vision -chest  pain -confusion, trouble speaking or understanding -feeling faint or lightheaded, falls -high blood pressure -muscle aches or pains -pain, swelling, warmth in the leg -rapid weight gain -severe headaches -sudden numbness or weakness of the face, arm or leg -trouble walking, dizziness, loss of balance or coordination -seizures (convulsions) -swelling of the ankles, feet, hands -unusually weak or tired Side effects that usually do not require medical attention (report to your doctor or health care professional if they continue or are bothersome): -diarrhea -fever, chills (flu-like symptoms) -headaches -nausea, vomiting -redness, stinging, or swelling at site where injected This list may not describe all possible side effects. Call your doctor for medical advice about side effects. You may report side effects to FDA at 1-800-FDA-1088. Where should I keep my medicine? Keep out of the reach of children. Store in a refrigerator between 2 and 8 degrees C (36 and 46 degrees F). Do not freeze. Do not shake. Throw away any unused portion if using a single-dose vial. Throw away any unused medicine after the expiration date. NOTE: This sheet is a summary. It may not cover all possible information. If you have questions about this medicine, talk to your doctor, pharmacist, or health care provider.  2015, [employer]. (2008-03-29 10:23:57)  

## 2016-09-24 ENCOUNTER — Ambulatory Visit (INDEPENDENT_AMBULATORY_CARE_PROVIDER_SITE_OTHER): Payer: Medicare Other | Admitting: Internal Medicine

## 2016-09-24 ENCOUNTER — Encounter: Payer: Self-pay | Admitting: Internal Medicine

## 2016-09-24 DIAGNOSIS — E1122 Type 2 diabetes mellitus with diabetic chronic kidney disease: Secondary | ICD-10-CM

## 2016-09-24 DIAGNOSIS — N184 Chronic kidney disease, stage 4 (severe): Secondary | ICD-10-CM | POA: Diagnosis not present

## 2016-09-24 NOTE — Progress Notes (Signed)
Patient ID: Lindsey Mcguire, female   DOB: Feb 28, 1935, 82 y.o.   MRN: [id number]   HPI: Lindsey Mcguire is a 82 y.o.-year-old female, referred by her PCP, Leeroy Cha, MD, for management of DM2, dx in 1980s, non-insulin-dependent, uncontrolled, with complications (CKD stage 4 - on Aranesp - last 06/2016).  Last hemoglobin A1c was: 08/26/2016: 7.1% No results found for: HGBA1C Before last HbA1c: lower  Pt is on a regimen of: - Actos 45 mg daily  - Amaryl 2 mg in am - restarted 08/26/2016 >> started to break them in half after she had the low CBGs beginning on this month: 1 mg before b'fast  Pt checks her sugars 1x a day in am: - am: 45, 48 x1, 68 x1, 75-120, 134 - 2h after b'fast: n/c - before lunch: 110-126 - 2h after lunch: 137-138 - before dinner: 121-123 - 2h after dinner: 155, 157 - bedtime: n/c - nighttime: n/c + 2 lows. Lowest sugar was 45 while on 2 mg Amaryl; she has hypoglycemia awareness at 70.  Highest sugar was 157.  Glucometer: OneTouch  Pt's meals are: - Breakfast: cereals; oatmeal; toast + boiled egg, OJ, fruit, yoghurt  - Lunch: tuna; PB sandwich; soup; crackers - Dinner: chicken, rolls, veggies; jello - Snacks: apple, PB crackers, sandwich - [place]  - + CKD (Dr. Florene Glen), last BUN/creatinine:  Lab Results  Component Value Date   BUN 32.8 (H) 06/27/2016   BUN 33.9 (H) 02/29/2016   CREATININE 2.0 (H) 06/27/2016   CREATININE 2.3 (H) 02/29/2016  On Valsartan 160. - last set of lipids: No results found for: CHOL, HDL, LDLCALC, LDLDIRECT, TRIG, CHOLHDL  On Lipitor 40. - last eye exam was on 12/26/2015. No DR.  Dr. Gershon Crane. - no numbness and tingling in her feet.  Pt has FH of DM in M, S, B, 3 nieces.  She has a h/o HTN, HL, anemia, osteopenia, bladder incontinence  ROS: Constitutional: + weight loss, no fatigue, + subjective hypothermia, + increased urination and nocturia Eyes: no blurry vision, no xerophthalmia ENT: no sore throat, no  nodules palpated in throat, no dysphagia/odynophagia, no hoarseness, + hypoacusis Cardiovascular: no CP/+ SOB/no palpitations/+ leg swelling Respiratory: no cough/+ SOB Gastrointestinal: no N/V/D/+ C Musculoskeletal: no muscle/joint aches Skin: no rashes, + hair loss Neurological: no tremors/numbness/tingling/dizziness Psychiatric: no depression/anxiety  Past Medical History:  Diagnosis Date  . Anemia 10/2003  . Diabetes mellitus   . Hypertension    Past Surgical History:  Procedure Laterality Date  . CATARACT EXTRACTION  12/06/2003   left eye  . TUBAL LIGATION  1977  . YAG LASER APPLICATION Right 3/53/2992   Procedure: YAG LASER APPLICATION;  Surgeon: Elta Guadeloupe T. Gershon Crane, MD;  Location: [facility];  Service: Ophthalmology;  Laterality: Right;   Social History   Social History  . Marital status: Married    Spouse name: N/A  . Number of children: 2   Occupational History  . retired.   Social History Main Topics  . Smoking status: Never Smoker  . Smokeless tobacco: Never Used  . Alcohol use No  . Drug use: No   Current Outpatient Prescriptions on File Prior to Visit  Medication Sig Dispense Refill  . amLODipine (NORVASC) 10 MG tablet Take 10 mg by mouth daily.    Marland Kitchen aspirin 81 MG tablet Take 81 mg by mouth daily.     Marland Kitchen atorvastatin (LIPITOR) 40 MG tablet Take 40 mg by mouth daily.    . Calcium Carb-Cholecalciferol [phone] MG-UNIT CAPS  Take 2 tablets by mouth every other day.     . ferrous sulfate 325 (65 FE) MG tablet Take 325 mg by mouth daily with breakfast.    . glimepiride (AMARYL) 2 MG tablet Take 2 mg by mouth daily with breakfast.    . Multiple Vitamin (MULTIVITAMIN) tablet Take 1 tablet by mouth daily.    . nitroGLYCERIN (NITRODUR - DOSED IN MG/24 HR) 0.2 mg/hr Place 1 patch onto the skin daily as needed (shortness of breath).     . pioglitazone (ACTOS) 45 MG tablet Take 45 mg by mouth daily.    Vladimir Faster Glycol-Propyl Glycol (SYSTANE OP) Place 2 drops into both eyes 4  (four) times daily as needed.     . Polyethylene Glycol 3350 (MIRALAX PO) Take 17 g by mouth daily as needed.     . valsartan-hydrochlorothiazide (DIOVAN-HCT) 160-12.5 MG per tablet Take 1 tablet by mouth daily.    . vitamin E 400 UNIT capsule Take 400 Units by mouth daily.     . darbepoetin alfa-polysorbate (ARANESP) 200 MCG/ML injection Inject 200 mcg into the skin every 6 (six) months. If needed     No current facility-administered medications on file prior to visit.    No Known Allergies Family History  Problem Relation Age of Onset  . Diabetes Mother   . Hypertension Mother   . Cancer Brother   . Diabetes Brother   . Hypertension Maternal Grandmother   . Diabetes Maternal Grandmother   . Cancer Brother   . Diabetes Brother     PE: BP 118/60 (BP Location: Left Arm, Patient Position: Sitting)   Pulse 81   Ht 5' (1.524 m)   Wt 172 lb (78 kg)   SpO2 96%   BMI 33.59 kg/m  Wt Readings from Last 3 Encounters:  09/24/16 172 lb (78 kg)  02/29/16 186 lb 3.2 oz (84.5 kg)  07/07/15 193 lb 11.2 oz (87.9 kg)   Constitutional: overweight, in NAD Eyes: PERRLA, EOMI, no exophthalmos ENT: moist mucous membranes, no thyromegaly, no cervical lymphadenopathy Cardiovascular: RRR, + 2/6 SEM, no RG, + LE edema - pitting, periankle Respiratory: CTA B Gastrointestinal: abdomen soft, NT, ND, BS+ Musculoskeletal: no deformities, strength intact in all 4 Skin: moist, warm, no rashes Neurological: + mild tremor with outstretched hands, DTR normal in all 4  ASSESSMENT: 1. DM2, non-insulin-dependent, uncontrolled, with complications - CKD stage 4  PLAN:  1. Patient with long-standing, uncontrolled diabetes, on oral antidiabetic regimen, with fair control. She had a period of several days at the beginning of the month when sugars were quite lows, after restarting Amaryl 2 mg in am. Since then, she cut the tablet in 2 >> 1 mg daily >> sugars are great! We discussed about continuing the same dose  for now, but move it before, rather than with, b'fast. Regarding Actos, we discussed about starting to decrease the dose, but will start at next visit. B/c of her kidney ds, we cannot use other oral meds and the alternative to Actos is basal insulin. I am not eager to start this for now >> she agrees. - I suggested to:  Patient Instructions  Please continue: - Actos 45 mg daily  Continue: - Amaryl 1 mg daily but move this before b'fast  Please let me know if the sugars are consistently <80 or >200.  Please return in 3 months with your sugar log.   - continue checking sugars at different times of the day - check 1-2 times a  day, rotating checks - given sugar log and advised how to fill it and to bring it at next appt  - given foot care handout and explained the principles  - given instructions for hypoglycemia management "15-15 rule"  - advised for yearly eye exams >> she is UTD - Return to clinic in 3 mo with sugar log   Philemon Kingdom, MD PhD [facility] Endocrinology

## 2016-09-24 NOTE — Patient Instructions (Addendum)
Please continue: - Actos 45 mg daily  Continue: - Amaryl 1 mg daily but move this before b'fast  Please let me know if the sugars are consistently <80 or >200.  Please return in 3 months with your sugar log.   PATIENT INSTRUCTIONS FOR TYPE 2 DIABETES:  **Please join MyChart!** - see attached instructions about how to join if you have not done so already.  DIET AND EXERCISE Diet and exercise is an important part of diabetic treatment.  We recommended aerobic exercise in the form of brisk walking (working between 40-60% of maximal aerobic capacity, similar to brisk walking) for 150 minutes per week (such as 30 minutes five days per week) along with 3 times per week performing 'resistance' training (using various gauge rubber tubes with handles) 5-10 exercises involving the major muscle groups (upper body, lower body and core) performing 10-15 repetitions (or near fatigue) each exercise. Start at half the above goal but build slowly to reach the above goals. If limited by weight, joint pain, or disability, we recommend daily walking in a swimming pool with water up to waist to reduce pressure from joints while allow for adequate exercise.    BLOOD GLUCOSES Monitoring your blood glucoses is important for continued management of your diabetes. Please check your blood glucoses 2-4 times a day: fasting, before meals and at bedtime (you can rotate these measurements - e.g. one day check before the 3 meals, the next day check before 2 of the meals and before bedtime, etc.).   HYPOGLYCEMIA (low blood sugar) Hypoglycemia is usually a reaction to not eating, exercising, or taking too much insulin/ other diabetes drugs.  Symptoms include tremors, sweating, hunger, confusion, headache, etc. Treat IMMEDIATELY with 15 grams of Carbs: . 4 glucose tablets .  cup regular juice/soda . 2 tablespoons raisins . 4 teaspoons sugar . 1 tablespoon honey Recheck blood glucose in 15 mins and repeat above if still  symptomatic/blood glucose <100.  RECOMMENDATIONS TO REDUCE YOUR RISK OF DIABETIC COMPLICATIONS: * Take your prescribed MEDICATION(S) * Follow a DIABETIC diet: Complex carbs, fiber rich foods, (monounsaturated and polyunsaturated) fats * AVOID saturated/trans fats, high fat foods, >2,300 mg salt per day. * EXERCISE at least 5 times a week for 30 minutes or preferably daily.  * DO NOT SMOKE OR DRINK more than 1 drink a day. * Check your FEET every day. Do not wear tightfitting shoes. Contact us if you develop an ulcer * See your EYE doctor once a year or more if needed * Get a FLU shot once a year * Get a PNEUMONIA vaccine once before and once after age 79 years  GOALS:  * Your Hemoglobin A1c of <7%  * fasting sugars need to be <130 * after meals sugars need to be <180 (2h after you start eating) * Your Systolic BP should be 096 or lower  * Your Diastolic BP should be 80 or lower  * Your HDL (Good Cholesterol) should be 40 or higher  * Your LDL (Bad Cholesterol) should be 100 or lower. * Your Triglycerides should be 150 or lower  * Your Urine microalbumin (kidney function) should be <30 * Your Body Mass Index should be 25 or lower    Please consider the following ways to cut down carbs and fat and increase fiber and micronutrients in your diet: - substitute whole grain for white bread or pasta - substitute brown rice for white rice - substitute 90-calorie flat bread pieces for slices of bread when  possible - substitute sweet potatoes or yams for white potatoes - substitute humus for margarine - substitute tofu for cheese when possible - substitute almond or rice milk for regular milk (would not drink soy milk daily due to concern for soy estrogen influence on breast cancer risk) - substitute dark chocolate for other sweets when possible - substitute water - can add lemon or orange slices for taste - for diet sodas (artificial sweeteners will trick your body that you can eat sweets  without getting calories and will lead you to overeating and weight gain in the long run) - do not skip breakfast or other meals (this will slow down the metabolism and will result in more weight gain over time)  - can try smoothies made from fruit and almond/rice milk in am instead of regular breakfast - can also try old-fashioned (not instant) oatmeal made with almond/rice milk in am - order the dressing on the side when eating salad at a restaurant (pour less than half of the dressing on the salad) - eat as little meat as possible - can try juicing, but should not forget that juicing will get rid of the fiber, so would alternate with eating raw veg./fruits or drinking smoothies - use as little oil as possible, even when using olive oil - can dress a salad with a mix of balsamic vinegar and lemon juice, for e.g. - use agave nectar, stevia sugar, or regular sugar rather than artificial sweateners - steam or broil/roast veggies  - snack on veggies/fruit/nuts (unsalted, preferably) when possible, rather than processed foods - reduce or eliminate aspartame in diet (it is in diet sodas, chewing gum, etc) Read the labels!   Try to read Dr. Janene Harvey book: "Program for Reversing Diabetes" for other ideas for healthy eating.

## 2016-10-29 NOTE — Progress Notes (Signed)
[facility] OFFICE PROGRESS NOTE  Leeroy Cha, MD [address] [place] [address]  DIAGNOSIS: Anemia due to chronic renal failure treated with erythropoietin, stage 4 (severe) (HCC)     CHIEF COMPLAIN: follow up   CURRENT THERAPY: Aranesp 200-376mcg every 3-4 month if Hb<11g/dl   INTERVAL HISTORY: Lindsey Mcguire 82 y.o. female with a problem list consisting of:   1. Anemia secondary to renal insufficiency. Currently on Aranesp injections as needed for hemoglobin less than or equal to 11.  2. Renal insufficiency.  3. Spinal stenosis at L4-5 and L5-6 as per MRI of the lumbar spine on 06/16/2010.  4. Hypertension.  5. Diabetes mellitus type 2.  6. Systolic ejection murmur.  7. Status post cervical spine surgery by Dr. Hazle Coca in October 2010.  8. Last mammogram 01/2014  9. Last colonoscopy in 2008, next one due in 2018   INTERIM HISTORY: Jera returns for follow-up and Aranesp injection. She is doing well. Her last injection was March and she has been doing well since. Her energy level is up. Denies chest pain. Has shortness of breath when walking occasionally. She walks about 1 mile a day. She is still able to do her house-work. She has no other concerns.    MEDICAL HISTORY: Past Medical History:  Diagnosis Date  . Anemia 10/2003  . Diabetes mellitus   . Hypertension     INTERIM HISTORY: has Anemia due to chronic renal failure treated with erythropoietin and Type 2 diabetes mellitus with stage 4 chronic kidney disease, without long-term current use of insulin ([place]) on her problem list.    ALLERGIES:  has No Known Allergies.  MEDICATIONS: has a current medication list which includes the following prescription(s): amlodipine, aspirin, atorvastatin, calcium carb-cholecalciferol, darbepoetin alfa-polysorbate, ferrous sulfate, glimepiride, multivitamin, nitroglycerin, pioglitazone, polyethyl glycol-propyl glycol, polyethylene  glycol 3350, valsartan-hydrochlorothiazide, and vitamin e.  SURGICAL HISTORY:  Past Surgical History:  Procedure Laterality Date  . CATARACT EXTRACTION  12/06/2003   left eye  . TUBAL LIGATION  1977  . YAG LASER APPLICATION Right 05/01/7251   Procedure: YAG LASER APPLICATION;  Surgeon: Elta Guadeloupe T. Gershon Crane, MD;  Location: [facility];  Service: Ophthalmology;  Laterality: Right;    REVIEW OF SYSTEMS:   Constitutional: Denies fevers, chills or abnormal weight loss Eyes: Denies blurriness of vision Ears, nose, mouth, throat, and face: Denies mucositis or sore throat Respiratory: Denies cough, dyspnea or wheezes Cardiovascular: Denies palpitation, chest discomfort or lower extremity swelling Gastrointestinal:  Denies nausea, heartburn or change in bowel habits Skin: Denies abnormal skin rashes Lymphatics: Denies new lymphadenopathy or easy bruising Neurological:Denies numbness, tingling or new weaknesses Behavioral/Psych: Mood is stable, no new changes  All other systems were reviewed with the patient and are negative.  PHYSICAL EXAMINATION:  ECOG PERFORMANCE STATUS: 0 - Asymptomatic  Blood pressure (!) 135/54, pulse 87, temperature 97.7 F (36.5 C), temperature source Oral, resp. rate 18, height 5' (1.524 m), weight 177 lb (80.3 kg), SpO2 100 %.  GENERAL:alert, no distress and comfortable, elderly female SKIN: skin color, texture, turgor are normal, no rashes or significant lesions; multiple moles on neck and face EYES: normal, Conjunctiva are pink and non-injected, sclera clear OROPHARYNX:no exudate, no erythema and lips, buccal mucosa, and tongue normal  NECK: supple, thyroid normal size, non-tender, without nodularity LYMPH:  no palpable lymphadenopathy in the cervical, axillary or supraclavicular LUNGS: clear to auscultation and percussion with normal breathing effort HEART: regular rate & rhythm and 1/6 SEM and  no lower extremity edema ABDOMEN:abdomen soft, non-tender and normal bowel  sounds Musculoskeletal:no cyanosis of digits and no clubbing  NEURO: alert & oriented x 3 with fluent speech, no focal motor/sensory deficits   LABORATORY DATA: CBC Latest Ref Rng & Units 10/31/2016 06/27/2016 02/29/2016  WBC 3.9 - 10.3 10e3/uL 3.3(L) 4.0 4.2  Hemoglobin 11.6 - 15.9 g/dL 9.5(L) 9.9(L) 10.5(L)  Hematocrit 34.8 - 46.6 % 29.1(L) 30.1(L) 32.0(L)  Platelets 145 - 400 10e3/uL 123(L) 111(L) 131(L)    CMP Latest Ref Rng & Units 10/31/2016 06/27/2016 02/29/2016  Glucose 70 - 140 mg/dl 88 138 143(H)  BUN 7.0 - 26.0 mg/dL 32.2(H) 32.8(H) 33.9(H)  Creatinine 0.6 - 1.1 mg/dL 1.9(H) 2.0(H) 2.3(H)  Sodium 136 - 145 mEq/L 141 140 139  Potassium 3.5 - 5.1 mEq/L 3.6 3.8 3.7  Chloride 98 - 107 mEq/L - - -  CO2 22 - 29 mEq/L 22 24 23   Calcium 8.4 - 10.4 mg/dL 9.6 10.0 9.5  Total Protein 6.4 - 8.3 g/dL 8.0 7.3 7.9  Total Bilirubin 0.20 - 1.20 mg/dL 0.36 0.37 0.39  Alkaline Phos 40 - 150 U/L 86 72 96  AST 5 - 34 U/L 21 16 19   ALT 0 - 55 U/L 13 10 14       RADIOGRAPHIC STUDIES: 1. CT scan of the abdomen and pelvis with IV contrast on 05/15/2010 showed rectal fecal impaction without proximal bowel obstruction.  There was a small amount of adjacent free fluid in the pelvis. There is a calcified appendicolith without acute appendicitis. There was an umbilical hernia containing fat, as well as a non-incarcerated small bowel loop. The CT scan was obtained when the patient apparently went to the emergency room.  2. MRI of the lumbar spine without contrast on 06/16/2010 showed moderate spinal stenosis at L4-5 unchanged. There was moderate  spinal stenosis at L5-S1 with progression of slip at L5 on S1 with advanced facet degeneration. There was interval development of  left foraminal and left lateral disk protrusion at L5-S1 compared with the prior MRI carried out on 06/14/2007.  3. Digital screening mammograms on 02/08/2011 and 02/20/2011 showed no significant abnormalities. Special views of the left  breast were carried out as part of that exam.  4. Digital screening mammogram on 02/11/2012 showed no mammographic evidence of malignancy. 5. Digital screening mammogram of Left breast on 03/03/2013 showed no mammographic evidence of malignancy.  ASSESSMENT: KORTNEY POTVIN 82 y.o. female with a history of Anemia due to chronic renal failure treated with erythropoietin, stage 4 (severe) ([place])   PLAN:   1. Anemia due to chronic renal failure treated with erythropoietin. -her hemoglobin was 10.5 today,  Asymptomatic, will give Aranesp today. She has been getting the injection every 4 months on average. -Side effects from Aranesp were discussed with patient, which includes but not limited to thrombosis, stroke, heart attack, etc.  She is on baby aspirin. - we'll continue lab and Aranesp injection every 4 months  For now. If her hemoglobin drops further (<9),  I'll increase her Aranesp injection to every 3 months -I'll decrease her Aranesp from 300 to 229mcg due to her well controlled anemia  -  Hbg 9.5, will continue with Aranesp 242mcg injection today and every 4 months  - I will check her iron study periodically  - f/u in 8 months  2. CKD stage IV  -She will follow-up with her nephrologist Dr. Florene Glen   3.  hypertension, diabetes, arthritis  She will follow up with primary care  physician.  FOLLOW UP: Return to clinic for CBC and Aranesp injection every 4 months, I'll see her back in 8 months - check iron study  All questions were answered. The patient knows to call the clinic with any problems, questions or concerns. We can certainly see the patient much sooner if necessary.  I spent 10 minutes counseling the patient face to face. The total time spent in the appointment was 15 minutes.   This document serves as a record of services personally performed by Truitt Merle, MD. It was created on her behalf by Brandt Loosen, a trained medical scribe. The creation of this record is based on the  scribe's personal observations and the provider's statements to them. This document has been checked and approved by the attending provider.  Truitt Merle, MD 10/31/2016

## 2016-10-31 ENCOUNTER — Other Ambulatory Visit (HOSPITAL_BASED_OUTPATIENT_CLINIC_OR_DEPARTMENT_OTHER): Payer: Medicare Other

## 2016-10-31 ENCOUNTER — Ambulatory Visit (HOSPITAL_BASED_OUTPATIENT_CLINIC_OR_DEPARTMENT_OTHER): Payer: Medicare Other | Admitting: Hematology

## 2016-10-31 ENCOUNTER — Telehealth: Payer: Self-pay | Admitting: Hematology

## 2016-10-31 ENCOUNTER — Ambulatory Visit (HOSPITAL_BASED_OUTPATIENT_CLINIC_OR_DEPARTMENT_OTHER): Payer: Medicare Other

## 2016-10-31 VITALS — BP 135/54 | HR 87 | Temp 97.7°F | Resp 18 | Ht 60.0 in | Wt 177.0 lb

## 2016-10-31 DIAGNOSIS — N184 Chronic kidney disease, stage 4 (severe): Secondary | ICD-10-CM | POA: Diagnosis not present

## 2016-10-31 DIAGNOSIS — I10 Essential (primary) hypertension: Secondary | ICD-10-CM | POA: Diagnosis not present

## 2016-10-31 DIAGNOSIS — N183 Chronic kidney disease, stage 3 unspecified: Secondary | ICD-10-CM

## 2016-10-31 DIAGNOSIS — D631 Anemia in chronic kidney disease: Secondary | ICD-10-CM

## 2016-10-31 DIAGNOSIS — E119 Type 2 diabetes mellitus without complications: Secondary | ICD-10-CM

## 2016-10-31 LAB — CBC WITH DIFFERENTIAL/PLATELET
BASO%: 0.3 % (ref 0.0–2.0)
Basophils Absolute: 0 10*3/uL (ref 0.0–0.1)
EOS ABS: 0.1 10*3/uL (ref 0.0–0.5)
EOS%: 3 % (ref 0.0–7.0)
HEMATOCRIT: 29.1 % — AB (ref 34.8–46.6)
HGB: 9.5 g/dL — ABNORMAL LOW (ref 11.6–15.9)
LYMPH#: 0.8 10*3/uL — AB (ref 0.9–3.3)
LYMPH%: 25.3 % (ref 14.0–49.7)
MCH: 27.1 pg (ref 25.1–34.0)
MCHC: 32.6 g/dL (ref 31.5–36.0)
MCV: 82.9 fL (ref 79.5–101.0)
MONO#: 0.4 10*3/uL (ref 0.1–0.9)
MONO%: 12.8 % (ref 0.0–14.0)
NEUT#: 1.9 10*3/uL (ref 1.5–6.5)
NEUT%: 58.6 % (ref 38.4–76.8)
PLATELETS: 123 10*3/uL — AB (ref 145–400)
RBC: 3.51 10*6/uL — AB (ref 3.70–5.45)
RDW: 15.9 % — ABNORMAL HIGH (ref 11.2–14.5)
WBC: 3.3 10*3/uL — ABNORMAL LOW (ref 3.9–10.3)

## 2016-10-31 LAB — COMPREHENSIVE METABOLIC PANEL
ALT: 13 U/L (ref 0–55)
ANION GAP: 9 meq/L (ref 3–11)
AST: 21 U/L (ref 5–34)
Albumin: 4.1 g/dL (ref 3.5–5.0)
Alkaline Phosphatase: 86 U/L (ref 40–150)
BILIRUBIN TOTAL: 0.36 mg/dL (ref 0.20–1.20)
BUN: 32.2 mg/dL — ABNORMAL HIGH (ref 7.0–26.0)
CALCIUM: 9.6 mg/dL (ref 8.4–10.4)
CHLORIDE: 110 meq/L — AB (ref 98–109)
CO2: 22 meq/L (ref 22–29)
CREATININE: 1.9 mg/dL — AB (ref 0.6–1.1)
EGFR: 28 mL/min/{1.73_m2} — AB (ref >90)
Glucose: 88 mg/dl (ref 70–140)
Potassium: 3.6 mEq/L (ref 3.5–5.1)
Sodium: 141 mEq/L (ref 136–145)
TOTAL PROTEIN: 8 g/dL (ref 6.4–8.3)

## 2016-10-31 MED ORDER — DARBEPOETIN ALFA 200 MCG/0.4ML IJ SOSY
200.0000 ug | PREFILLED_SYRINGE | Freq: Once | INTRAMUSCULAR | Status: AC
  Administered 2016-10-31: 200 ug via SUBCUTANEOUS
  Filled 2016-10-31: qty 0.4

## 2016-10-31 NOTE — Patient Instructions (Signed)
Darbepoetin Alfa injection What is this medicine? DARBEPOETIN ALFA (dar be POE e tin AL fa) helps your body make more red blood cells. It is used to treat anemia caused by chronic kidney failure and chemotherapy. This medicine may be used for other purposes; ask your health care provider or pharmacist if you have questions. COMMON BRAND NAME(S): Aranesp What should I tell my health care provider before I take this medicine? They need to know if you have any of these conditions: -blood clotting disorders or history of blood clots -cancer patient not on chemotherapy -cystic fibrosis -heart disease, such as angina, heart failure, or a history of a heart attack -hemoglobin level of 12 g/dL or greater -high blood pressure -low levels of folate, iron, or vitamin B12 -seizures -an unusual or allergic reaction to darbepoetin, erythropoietin, albumin, hamster proteins, latex, other medicines, foods, dyes, or preservatives -pregnant or trying to get pregnant -breast-feeding How should I use this medicine? This medicine is for injection into a vein or under the skin. It is usually given by a health care professional in a hospital or clinic setting. If you get this medicine at home, you will be taught how to prepare and give this medicine. Do not shake the solution before you withdraw a dose. Use exactly as directed. Take your medicine at regular intervals. Do not take your medicine more often than directed. It is important that you put your used needles and syringes in a special sharps container. Do not put them in a trash can. If you do not have a sharps container, call your pharmacist or healthcare provider to get one. Talk to your pediatrician regarding the use of this medicine in children. While this medicine may be used in children as young as 1 year for selected conditions, precautions do apply. Overdosage: If you think you have taken too much of this medicine contact a poison control center or  emergency room at once. NOTE: This medicine is only for you. Do not share this medicine with others. What if I miss a dose? If you miss a dose, take it as soon as you can. If it is almost time for your next dose, take only that dose. Do not take double or extra doses. What may interact with this medicine? Do not take this medicine with any of the following medications: -epoetin alfa This list may not describe all possible interactions. Give your health care provider a list of all the medicines, herbs, non-prescription drugs, or dietary supplements you use. Also tell them if you smoke, drink alcohol, or use illegal drugs. Some items may interact with your medicine. What should I watch for while using this medicine? Visit your prescriber or health care professional for regular checks on your progress and for the needed blood tests and blood pressure measurements. It is especially important for the doctor to make sure your hemoglobin level is in the desired range, to limit the risk of potential side effects and to give you the best benefit. Keep all appointments for any recommended tests. Check your blood pressure as directed. Ask your doctor what your blood pressure should be and when you should contact him or her. As your body makes more red blood cells, you may need to take iron, folic acid, or vitamin B supplements. Ask your doctor or health care provider which products are right for you. If you have kidney disease continue dietary restrictions, even though this medication can make you feel better. Talk with your doctor or health   care professional about the foods you eat and the vitamins that you take. What side effects may I notice from receiving this medicine? Side effects that you should report to your doctor or health care professional as soon as possible: -allergic reactions like skin rash, itching or hives, swelling of the face, lips, or tongue -breathing problems -changes in vision -chest  pain -confusion, trouble speaking or understanding -feeling faint or lightheaded, falls -high blood pressure -muscle aches or pains -pain, swelling, warmth in the leg -rapid weight gain -severe headaches -sudden numbness or weakness of the face, arm or leg -trouble walking, dizziness, loss of balance or coordination -seizures (convulsions) -swelling of the ankles, feet, hands -unusually weak or tired Side effects that usually do not require medical attention (report to your doctor or health care professional if they continue or are bothersome): -diarrhea -fever, chills (flu-like symptoms) -headaches -nausea, vomiting -redness, stinging, or swelling at site where injected This list may not describe all possible side effects. Call your doctor for medical advice about side effects. You may report side effects to FDA at 1-800-FDA-1088. Where should I keep my medicine? Keep out of the reach of children. Store in a refrigerator between 2 and 8 degrees C (36 and 46 degrees F). Do not freeze. Do not shake. Throw away any unused portion if using a single-dose vial. Throw away any unused medicine after the expiration date. NOTE: This sheet is a summary. It may not cover all possible information. If you have questions about this medicine, talk to your doctor, pharmacist, or health care provider.  2015, [employer]. (2008-03-29 10:23:57)  

## 2016-10-31 NOTE — Telephone Encounter (Signed)
Scheduled appt per 7/5 los - Gave patient AVS and calender per los.  

## 2016-11-02 ENCOUNTER — Encounter: Payer: Self-pay | Admitting: Hematology

## 2017-01-01 ENCOUNTER — Ambulatory Visit: Payer: Medicare Other | Admitting: Internal Medicine

## 2017-01-23 ENCOUNTER — Other Ambulatory Visit (HOSPITAL_COMMUNITY): Payer: Self-pay | Admitting: Internal Medicine

## 2017-01-23 DIAGNOSIS — R921 Mammographic calcification found on diagnostic imaging of breast: Secondary | ICD-10-CM

## 2017-02-22 ENCOUNTER — Observation Stay (HOSPITAL_COMMUNITY)
Admission: EM | Admit: 2017-02-22 | Discharge: 2017-02-24 | Disposition: A | Discharge status: Home / Self Care | Payer: Medicare Other | Attending: Neurosurgery | Admitting: Neurosurgery

## 2017-02-22 ENCOUNTER — Emergency Department (HOSPITAL_COMMUNITY): Payer: Medicare Other

## 2017-02-22 DIAGNOSIS — Z7984 Long term (current) use of oral hypoglycemic drugs: Secondary | ICD-10-CM | POA: Diagnosis not present

## 2017-02-22 DIAGNOSIS — I129 Hypertensive chronic kidney disease with stage 1 through stage 4 chronic kidney disease, or unspecified chronic kidney disease: Secondary | ICD-10-CM | POA: Insufficient documentation

## 2017-02-22 DIAGNOSIS — S0101XA Laceration without foreign body of scalp, initial encounter: Secondary | ICD-10-CM | POA: Insufficient documentation

## 2017-02-22 DIAGNOSIS — S0990XA Unspecified injury of head, initial encounter: Secondary | ICD-10-CM | POA: Diagnosis present

## 2017-02-22 DIAGNOSIS — R51 Headache: Secondary | ICD-10-CM | POA: Diagnosis not present

## 2017-02-22 DIAGNOSIS — Y929 Unspecified place or not applicable: Secondary | ICD-10-CM | POA: Diagnosis not present

## 2017-02-22 DIAGNOSIS — Z7982 Long term (current) use of aspirin: Secondary | ICD-10-CM | POA: Diagnosis not present

## 2017-02-22 DIAGNOSIS — Y998 Other external cause status: Secondary | ICD-10-CM | POA: Insufficient documentation

## 2017-02-22 DIAGNOSIS — I629 Nontraumatic intracranial hemorrhage, unspecified: Secondary | ICD-10-CM

## 2017-02-22 DIAGNOSIS — M542 Cervicalgia: Secondary | ICD-10-CM | POA: Insufficient documentation

## 2017-02-22 DIAGNOSIS — I619 Nontraumatic intracerebral hemorrhage, unspecified: Secondary | ICD-10-CM | POA: Diagnosis present

## 2017-02-22 DIAGNOSIS — Z79899 Other long term (current) drug therapy: Secondary | ICD-10-CM | POA: Diagnosis not present

## 2017-02-22 DIAGNOSIS — E1122 Type 2 diabetes mellitus with diabetic chronic kidney disease: Secondary | ICD-10-CM | POA: Diagnosis not present

## 2017-02-22 DIAGNOSIS — Y9389 Activity, other specified: Secondary | ICD-10-CM | POA: Insufficient documentation

## 2017-02-22 DIAGNOSIS — I6200 Nontraumatic subdural hemorrhage, unspecified: Secondary | ICD-10-CM | POA: Diagnosis not present

## 2017-02-22 DIAGNOSIS — I609 Nontraumatic subarachnoid hemorrhage, unspecified: Secondary | ICD-10-CM | POA: Diagnosis not present

## 2017-02-22 DIAGNOSIS — N182 Chronic kidney disease, stage 2 (mild): Secondary | ICD-10-CM | POA: Insufficient documentation

## 2017-02-22 LAB — BASIC METABOLIC PANEL
ANION GAP: 10 (ref 5–15)
BUN: 30 mg/dL — AB (ref 6–20)
CHLORIDE: 107 mmol/L (ref 101–111)
CO2: 19 mmol/L — ABNORMAL LOW (ref 22–32)
Calcium: 8.9 mg/dL (ref 8.9–10.3)
Creatinine, Ser: 1.74 mg/dL — ABNORMAL HIGH (ref 0.44–1.00)
GFR calc Af Amer: 31 mL/min — ABNORMAL LOW (ref >60)
GFR, EST NON AFRICAN AMERICAN: 27 mL/min — AB (ref >60)
GLUCOSE: 104 mg/dL — AB (ref 65–99)
POTASSIUM: 5.4 mmol/L — AB (ref 3.5–5.1)
SODIUM: 136 mmol/L (ref 135–145)

## 2017-02-22 LAB — CBC WITH DIFFERENTIAL/PLATELET
BASOS ABS: 0 10*3/uL (ref 0.0–0.1)
Basophils Relative: 0 %
EOS PCT: 1 %
Eosinophils Absolute: 0.1 10*3/uL (ref 0.0–0.7)
HCT: 29.9 % — ABNORMAL LOW (ref 36.0–46.0)
HEMOGLOBIN: 9.5 g/dL — AB (ref 12.0–15.0)
LYMPHS ABS: 0.8 10*3/uL (ref 0.7–4.0)
LYMPHS PCT: 15 %
MCH: 26.3 pg (ref 26.0–34.0)
MCHC: 31.8 g/dL (ref 30.0–36.0)
MCV: 82.8 fL (ref 78.0–100.0)
Monocytes Absolute: 0.5 10*3/uL (ref 0.1–1.0)
Monocytes Relative: 9 %
NEUTROS PCT: 74 %
Neutro Abs: 3.9 10*3/uL (ref 1.7–7.7)
PLATELETS: 128 10*3/uL — AB (ref 150–400)
RBC: 3.61 MIL/uL — AB (ref 3.87–5.11)
RDW: 15.2 % (ref 11.5–15.5)
WBC: 5.2 10*3/uL (ref 4.0–10.5)

## 2017-02-22 LAB — CBG MONITORING, ED: GLUCOSE-CAPILLARY: 94 mg/dL (ref 65–99)

## 2017-02-22 MED ORDER — SODIUM CHLORIDE 0.9 % IV BOLUS (SEPSIS)
500.0000 mL | Freq: Once | INTRAVENOUS | Status: AC
  Administered 2017-02-22: 500 mL via INTRAVENOUS
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
## 2017-02-22 NOTE — ED Notes (Signed)
Pt given sprite and crackers  

## 2017-02-22 NOTE — ED Notes (Signed)
Patient transported to CT 

## 2017-02-23 ENCOUNTER — Observation Stay (HOSPITAL_COMMUNITY): Payer: Medicare Other

## 2017-02-23 ENCOUNTER — Encounter (HOSPITAL_COMMUNITY): Payer: Self-pay | Admitting: Emergency Medicine

## 2017-02-23 DIAGNOSIS — I619 Nontraumatic intracerebral hemorrhage, unspecified: Secondary | ICD-10-CM | POA: Diagnosis present

## 2017-02-23 DIAGNOSIS — I629 Nontraumatic intracranial hemorrhage, unspecified: Secondary | ICD-10-CM

## 2017-02-23 LAB — GLUCOSE, CAPILLARY
GLUCOSE-CAPILLARY: 148 mg/dL — AB (ref 65–99)
GLUCOSE-CAPILLARY: 115 mg/dL — AB (ref 65–99)
Glucose-Capillary: 90 mg/dL (ref 65–99)
Glucose-Capillary: 113 mg/dL — ABNORMAL HIGH (ref 65–99)

## 2017-02-23 MED ORDER — HYDROCODONE-ACETAMINOPHEN 5-325 MG PO TABS: 1.0000 | ORAL_TABLET | ORAL | Status: DC | PRN

## 2017-02-23 MED ORDER — ACETAMINOPHEN 325 MG PO TABS: 650.0000 mg | ORAL_TABLET | ORAL | Status: DC | PRN

## 2017-02-23 MED ORDER — ATORVASTATIN CALCIUM 40 MG PO TABS
40.0000 mg | ORAL_TABLET | Freq: Every day | ORAL | Status: DC
  Administered 2017-02-23 – 2017-02-24 (×2): 40 mg via ORAL
  Filled 2017-02-23 (×2): qty 1

## 2017-02-23 MED ORDER — AMLODIPINE BESYLATE 5 MG PO TABS
5.0000 mg | ORAL_TABLET | Freq: Every day | ORAL | Status: DC
  Administered 2017-02-23 – 2017-02-24 (×2): 5 mg via ORAL
  Filled 2017-02-23 (×2): qty 1

## 2017-02-23 MED ORDER — GLIMEPIRIDE 1 MG PO TABS
1.0000 mg | ORAL_TABLET | Freq: Every day | ORAL | Status: DC
  Administered 2017-02-23 – 2017-02-24 (×2): 1 mg via ORAL
  Filled 2017-02-23 (×2): qty 1

## 2017-02-23 MED ORDER — ONDANSETRON HCL 4 MG PO TABS: 4.0000 mg | ORAL_TABLET | Freq: Four times a day (QID) | ORAL | Status: DC | PRN

## 2017-02-23 MED ORDER — FERROUS SULFATE 325 (65 FE) MG PO TABS
650.0000 mg | ORAL_TABLET | Freq: Every day | ORAL | Status: DC
  Administered 2017-02-23 – 2017-02-24 (×2): 650 mg via ORAL
  Filled 2017-02-23 (×3): qty 2

## 2017-02-23 MED ORDER — VALSARTAN-HYDROCHLOROTHIAZIDE 160-12.5 MG PO TABS: 1.0000 | ORAL_TABLET | Freq: Every day | ORAL | Status: DC

## 2017-02-23 MED ORDER — ONDANSETRON HCL 4 MG/2ML IJ SOLN: 4.0000 mg | Freq: Four times a day (QID) | INTRAMUSCULAR | Status: DC | PRN

## 2017-02-23 MED ORDER — PIOGLITAZONE HCL 45 MG PO TABS
45.0000 mg | ORAL_TABLET | Freq: Every day | ORAL | Status: DC
  Administered 2017-02-23 – 2017-02-24 (×2): 45 mg via ORAL
  Filled 2017-02-23 (×2): qty 1

## 2017-02-23 MED ORDER — POLYVINYL ALCOHOL 1.4 % OP SOLN
1.0000 [drp] | Freq: Four times a day (QID) | OPHTHALMIC | Status: DC | PRN
  Filled 2017-02-23: qty 15

## 2017-02-23 MED ORDER — HYDROCHLOROTHIAZIDE 12.5 MG PO CAPS
12.5000 mg | ORAL_CAPSULE | Freq: Every day | ORAL | Status: DC
  Administered 2017-02-23 – 2017-02-24 (×2): 12.5 mg via ORAL
  Filled 2017-02-23 (×2): qty 1

## 2017-02-23 MED ORDER — ADULT MULTIVITAMIN W/MINERALS CH
1.0000 | ORAL_TABLET | Freq: Every day | ORAL | Status: DC
  Administered 2017-02-23 – 2017-02-24 (×2): 1 via ORAL
  Filled 2017-02-23 (×2): qty 1

## 2017-02-23 MED ORDER — IRBESARTAN 150 MG PO TABS
150.0000 mg | ORAL_TABLET | Freq: Every day | ORAL | Status: DC
  Administered 2017-02-23 – 2017-02-24 (×2): 150 mg via ORAL
  Filled 2017-02-23 (×2): qty 1
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
## 2017-02-23 NOTE — ED Notes (Signed)
Patient transported to CT 

## 2017-02-23 NOTE — ED Notes (Signed)
Patient placement notified of pt's situation.  There are beds on [facility] and that is where the patient is going to go, but the order needs to be put in.  Told them of conversation with Neuro.  Nursing and patient placement awaiting someone to put in a bed order.

## 2017-02-23 NOTE — ED Notes (Addendum)
Noted that patient has no bed order.  Neurosurgery paged and informed of this.  Neurosurgery stated the previous EDP was supposed to put in bed and inpatient orders.  This RN stated that the orders were not put in likely due to the fact that the patient has multiple consults in the chart.  Neuro stated she could go to 3[facility] or med-surg.  Reiterated that the previous EDP went home and someone needed to put the bed order in.
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
## 2017-02-23 NOTE — Progress Notes (Signed)
Chaplain shared AD forms with patient and son and daughter in law.  Told them to fill out as needed and call the chaplain to get notary and witnesses.  Had prayer with family. Conard Novak, Chaplain    02/23/17 1700  Clinical Encounter Type  Visited With Patient and family together  Visit Type Initial;Other (Comment) (give AD forms to fill out)  Referral From Physician  Consult/Referral To Chaplain  Spiritual Encounters  Spiritual Needs Prayer  Stress Factors  Patient Stress Factors None identified  Family Stress Factors None identified  Advance Directives (For Healthcare)  Would patient like information on creating a medical advance directive? Yes (Inpatient - patient requests chaplain consult to create a medical advance directive)

## 2017-02-24 LAB — GLUCOSE, CAPILLARY
GLUCOSE-CAPILLARY: 98 mg/dL (ref 65–99)
GLUCOSE-CAPILLARY: 107 mg/dL — AB (ref 65–99)
Glucose-Capillary: 78 mg/dL (ref 65–99)
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
## 2017-02-24 NOTE — Discharge Instructions (Signed)
Wound Care Instructions for left ear Laceration: 1.  Wound care - 1/2 str H2O2 and bacitracin ointment twice daily 2.  Elevate Head of Bed 3.  May use gauze dry dressing as needed. 4.  Monitor for swelling or increased pain.

## 2017-02-24 NOTE — Plan of Care (Signed)
Problem: Nutrition: Goal: Adequate nutrition will be maintained Outcome: Completed/Met Date Met: 02/24/17 Heart healthy diet, good appetite   
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
## 2017-02-28 ENCOUNTER — Telehealth: Payer: Self-pay | Admitting: *Deleted

## 2017-02-28 NOTE — Telephone Encounter (Signed)
Called pt & informed per Dr Burr Medico that we will cancel 03/03/17 appt & r/s for 1 month.  Platelet count from PCP was 109 & 02/24/17 hospital admit was 128.  Dr Burr Medico would like to give her some more time before checking again.  Pt expressed understanding. Message to [place].
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
## 2017-03-02 ENCOUNTER — Telehealth: Payer: Self-pay | Admitting: Hematology

## 2017-03-02 NOTE — Telephone Encounter (Signed)
S/w pt, advised 11/5 appt moved to 12/3 @ 10am.

## 2017-03-03 ENCOUNTER — Other Ambulatory Visit: Payer: Medicare Other

## 2017-03-03 ENCOUNTER — Ambulatory Visit: Payer: Medicare Other

## 2017-03-04 ENCOUNTER — Ambulatory Visit (HOSPITAL_COMMUNITY)
Admission: RE | Admit: 2017-03-04 | Discharge: 2017-03-04 | Disposition: A | Discharge status: Home / Self Care | Payer: Medicare Other | Source: Ambulatory Visit | Attending: Internal Medicine | Admitting: Internal Medicine

## 2017-03-04 DIAGNOSIS — R921 Mammographic calcification found on diagnostic imaging of breast: Secondary | ICD-10-CM | POA: Diagnosis present

## 2017-03-29 ENCOUNTER — Other Ambulatory Visit: Payer: Self-pay | Admitting: Nurse Practitioner

## 2017-03-31 ENCOUNTER — Other Ambulatory Visit: Payer: Self-pay | Admitting: Hematology

## 2017-03-31 ENCOUNTER — Ambulatory Visit (HOSPITAL_BASED_OUTPATIENT_CLINIC_OR_DEPARTMENT_OTHER): Payer: Medicare Other

## 2017-03-31 ENCOUNTER — Other Ambulatory Visit (HOSPITAL_BASED_OUTPATIENT_CLINIC_OR_DEPARTMENT_OTHER): Payer: Medicare Other

## 2017-03-31 VITALS — BP 137/58 | HR 67 | Temp 98.6°F | Resp 18

## 2017-03-31 DIAGNOSIS — D631 Anemia in chronic kidney disease: Secondary | ICD-10-CM

## 2017-03-31 DIAGNOSIS — N184 Chronic kidney disease, stage 4 (severe): Secondary | ICD-10-CM

## 2017-03-31 DIAGNOSIS — N183 Chronic kidney disease, stage 3 (moderate): Principal | ICD-10-CM

## 2017-03-31 LAB — COMPREHENSIVE METABOLIC PANEL
ALBUMIN: 3.8 g/dL (ref 3.5–5.0)
ALK PHOS: 74 U/L (ref 40–150)
ALT: 13 U/L (ref 0–55)
AST: 17 U/L (ref 5–34)
Anion Gap: 8 mEq/L (ref 3–11)
BUN: 42.7 mg/dL — ABNORMAL HIGH (ref 7.0–26.0)
CALCIUM: 9.9 mg/dL (ref 8.4–10.4)
CO2: 23 mEq/L (ref 22–29)
Chloride: 109 mEq/L (ref 98–109)
Creatinine: 2.1 mg/dL — ABNORMAL HIGH (ref 0.6–1.1)
EGFR: 25 mL/min/{1.73_m2} — AB (ref >60)
Glucose: 87 mg/dl (ref 70–140)
POTASSIUM: 3.9 meq/L (ref 3.5–5.1)
Sodium: 140 mEq/L (ref 136–145)
Total Bilirubin: 0.42 mg/dL (ref 0.20–1.20)
Total Protein: 7.5 g/dL (ref 6.4–8.3)

## 2017-03-31 LAB — CBC WITH DIFFERENTIAL/PLATELET
BASO%: 0.5 % (ref 0.0–2.0)
Basophils Absolute: 0 10*3/uL (ref 0.0–0.1)
EOS%: 2.2 % (ref 0.0–7.0)
Eosinophils Absolute: 0.1 10*3/uL (ref 0.0–0.5)
HCT: 26.3 % — ABNORMAL LOW (ref 34.8–46.6)
HGB: 8.5 g/dL — ABNORMAL LOW (ref 11.6–15.9)
LYMPH#: 0.8 10*3/uL — AB (ref 0.9–3.3)
LYMPH%: 21.9 % (ref 14.0–49.7)
MCH: 26.9 pg (ref 25.1–34.0)
MCHC: 32.3 g/dL (ref 31.5–36.0)
MCV: 83.3 fL (ref 79.5–101.0)
MONO#: 0.5 10*3/uL (ref 0.1–0.9)
MONO%: 13.2 % (ref 0.0–14.0)
NEUT%: 62.2 % (ref 38.4–76.8)
NEUTROS ABS: 2.4 10*3/uL (ref 1.5–6.5)
PLATELETS: 127 10*3/uL — AB (ref 145–400)
RBC: 3.15 10*6/uL — AB (ref 3.70–5.45)
RDW: 15.2 % — ABNORMAL HIGH (ref 11.2–14.5)
WBC: 3.8 10*3/uL — AB (ref 3.9–10.3)

## 2017-03-31 MED ORDER — DARBEPOETIN ALFA 200 MCG/0.4ML IJ SOSY
200.0000 ug | PREFILLED_SYRINGE | Freq: Once | INTRAMUSCULAR | Status: AC
  Administered 2017-03-31: 200 ug via SUBCUTANEOUS
  Filled 2017-03-31: qty 0.4

## 2017-03-31 NOTE — Patient Instructions (Signed)
Darbepoetin Alfa injection What is this medicine? DARBEPOETIN ALFA (dar be POE e tin AL fa) helps your body make more red blood cells. It is used to treat anemia caused by chronic kidney failure and chemotherapy. This medicine may be used for other purposes; ask your health care provider or pharmacist if you have questions. COMMON BRAND NAME(S): Aranesp What should I tell my health care provider before I take this medicine? They need to know if you have any of these conditions: -blood clotting disorders or history of blood clots -cancer patient not on chemotherapy -cystic fibrosis -heart disease, such as angina, heart failure, or a history of a heart attack -hemoglobin level of 12 g/dL or greater -high blood pressure -low levels of folate, iron, or vitamin B12 -seizures -an unusual or allergic reaction to darbepoetin, erythropoietin, albumin, hamster proteins, latex, other medicines, foods, dyes, or preservatives -pregnant or trying to get pregnant -breast-feeding How should I use this medicine? This medicine is for injection into a vein or under the skin. It is usually given by a health care professional in a hospital or clinic setting. If you get this medicine at home, you will be taught how to prepare and give this medicine. Do not shake the solution before you withdraw a dose. Use exactly as directed. Take your medicine at regular intervals. Do not take your medicine more often than directed. It is important that you put your used needles and syringes in a special sharps container. Do not put them in a trash can. If you do not have a sharps container, call your pharmacist or healthcare provider to get one. Talk to your pediatrician regarding the use of this medicine in children. While this medicine may be used in children as young as 1 year for selected conditions, precautions do apply. Overdosage: If you think you have taken too much of this medicine contact a poison control center or  emergency room at once. NOTE: This medicine is only for you. Do not share this medicine with others. What if I miss a dose? If you miss a dose, take it as soon as you can. If it is almost time for your next dose, take only that dose. Do not take double or extra doses. What may interact with this medicine? Do not take this medicine with any of the following medications: -epoetin alfa This list may not describe all possible interactions. Give your health care provider a list of all the medicines, herbs, non-prescription drugs, or dietary supplements you use. Also tell them if you smoke, drink alcohol, or use illegal drugs. Some items may interact with your medicine. What should I watch for while using this medicine? Visit your prescriber or health care professional for regular checks on your progress and for the needed blood tests and blood pressure measurements. It is especially important for the doctor to make sure your hemoglobin level is in the desired range, to limit the risk of potential side effects and to give you the best benefit. Keep all appointments for any recommended tests. Check your blood pressure as directed. Ask your doctor what your blood pressure should be and when you should contact him or her. As your body makes more red blood cells, you may need to take iron, folic acid, or vitamin B supplements. Ask your doctor or health care provider which products are right for you. If you have kidney disease continue dietary restrictions, even though this medication can make you feel better. Talk with your doctor or health   care professional about the foods you eat and the vitamins that you take. What side effects may I notice from receiving this medicine? Side effects that you should report to your doctor or health care professional as soon as possible: -allergic reactions like skin rash, itching or hives, swelling of the face, lips, or tongue -breathing problems -changes in vision -chest  pain -confusion, trouble speaking or understanding -feeling faint or lightheaded, falls -high blood pressure -muscle aches or pains -pain, swelling, warmth in the leg -rapid weight gain -severe headaches -sudden numbness or weakness of the face, arm or leg -trouble walking, dizziness, loss of balance or coordination -seizures (convulsions) -swelling of the ankles, feet, hands -unusually weak or tired Side effects that usually do not require medical attention (report to your doctor or health care professional if they continue or are bothersome): -diarrhea -fever, chills (flu-like symptoms) -headaches -nausea, vomiting -redness, stinging, or swelling at site where injected This list may not describe all possible side effects. Call your doctor for medical advice about side effects. You may report side effects to FDA at 1-800-FDA-1088. Where should I keep my medicine? Keep out of the reach of children. Store in a refrigerator between 2 and 8 degrees C (36 and 46 degrees F). Do not freeze. Do not shake. Throw away any unused portion if using a single-dose vial. Throw away any unused medicine after the expiration date. NOTE: This sheet is a summary. It may not cover all possible information. If you have questions about this medicine, talk to your doctor, pharmacist, or health care provider.  2015, [employer]. (2008-03-29 10:23:57)  
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
## 2017-07-02 ENCOUNTER — Inpatient Hospital Stay: Payer: Medicare Other | Admitting: Hematology

## 2017-07-02 ENCOUNTER — Telehealth: Payer: Self-pay | Admitting: Hematology

## 2017-07-02 ENCOUNTER — Inpatient Hospital Stay: Payer: Medicare Other

## 2017-07-02 ENCOUNTER — Inpatient Hospital Stay: Payer: Medicare Other | Attending: Hematology

## 2017-07-02 VITALS — BP 147/68 | HR 72 | Temp 98.1°F | Resp 20 | Ht 60.0 in | Wt 169.8 lb

## 2017-07-02 DIAGNOSIS — D631 Anemia in chronic kidney disease: Secondary | ICD-10-CM | POA: Diagnosis present

## 2017-07-02 DIAGNOSIS — I129 Hypertensive chronic kidney disease with stage 1 through stage 4 chronic kidney disease, or unspecified chronic kidney disease: Secondary | ICD-10-CM | POA: Insufficient documentation

## 2017-07-02 DIAGNOSIS — N184 Chronic kidney disease, stage 4 (severe): Secondary | ICD-10-CM | POA: Diagnosis not present

## 2017-07-02 DIAGNOSIS — N183 Chronic kidney disease, stage 3 (moderate): Secondary | ICD-10-CM

## 2017-07-02 LAB — CBC WITH DIFFERENTIAL/PLATELET
Basophils Absolute: 0 10*3/uL (ref 0.0–0.1)
Basophils Relative: 0 %
Eosinophils Absolute: 0.2 10*3/uL (ref 0.0–0.5)
Eosinophils Relative: 5 %
HEMATOCRIT: 29 % — AB (ref 34.8–46.6)
HEMOGLOBIN: 9.2 g/dL — AB (ref 11.6–15.9)
Lymphocytes Relative: 23 %
Lymphs Abs: 0.8 10*3/uL — ABNORMAL LOW (ref 0.9–3.3)
MCH: 27.1 pg (ref 25.1–34.0)
MCHC: 31.7 g/dL (ref 31.5–36.0)
MCV: 85.3 fL (ref 79.5–101.0)
MONO ABS: 0.6 10*3/uL (ref 0.1–0.9)
MONOS PCT: 18 %
NEUTROS PCT: 54 %
Neutro Abs: 1.9 10*3/uL (ref 1.5–6.5)
Platelets: 115 10*3/uL — ABNORMAL LOW (ref 145–400)
RBC: 3.4 MIL/uL — ABNORMAL LOW (ref 3.70–5.45)
RDW: 15.5 % — ABNORMAL HIGH (ref 11.2–14.5)
WBC: 3.5 10*3/uL — ABNORMAL LOW (ref 3.9–10.3)

## 2017-07-02 LAB — FERRITIN: Ferritin: 326 ng/mL — ABNORMAL HIGH (ref 9–269)

## 2017-07-02 MED ORDER — DARBEPOETIN ALFA 200 MCG/0.4ML IJ SOSY
200.0000 ug | PREFILLED_SYRINGE | Freq: Once | INTRAMUSCULAR | Status: AC
  Administered 2017-07-02: 200 ug via SUBCUTANEOUS

## 2017-07-02 NOTE — Telephone Encounter (Signed)
Scheduled appt per 3/6 los - Gave patient aVS and calender per los.

## 2017-07-02 NOTE — Patient Instructions (Signed)
Darbepoetin Alfa injection What is this medicine? DARBEPOETIN ALFA (dar be POE e tin AL fa) helps your body make more red blood cells. It is used to treat anemia caused by chronic kidney failure and chemotherapy. This medicine may be used for other purposes; ask your health care provider or pharmacist if you have questions. COMMON BRAND NAME(S): Aranesp What should I tell my health care provider before I take this medicine? They need to know if you have any of these conditions: -blood clotting disorders or history of blood clots -cancer patient not on chemotherapy -cystic fibrosis -heart disease, such as angina, heart failure, or a history of a heart attack -hemoglobin level of 12 g/dL or greater -high blood pressure -low levels of folate, iron, or vitamin B12 -seizures -an unusual or allergic reaction to darbepoetin, erythropoietin, albumin, hamster proteins, latex, other medicines, foods, dyes, or preservatives -pregnant or trying to get pregnant -breast-feeding How should I use this medicine? This medicine is for injection into a vein or under the skin. It is usually given by a health care professional in a hospital or clinic setting. If you get this medicine at home, you will be taught how to prepare and give this medicine. Do not shake the solution before you withdraw a dose. Use exactly as directed. Take your medicine at regular intervals. Do not take your medicine more often than directed. It is important that you put your used needles and syringes in a special sharps container. Do not put them in a trash can. If you do not have a sharps container, call your pharmacist or healthcare provider to get one. Talk to your pediatrician regarding the use of this medicine in children. While this medicine may be used in children as young as 1 year for selected conditions, precautions do apply. Overdosage: If you think you have taken too much of this medicine contact a poison control center or  emergency room at once. NOTE: This medicine is only for you. Do not share this medicine with others. What if I miss a dose? If you miss a dose, take it as soon as you can. If it is almost time for your next dose, take only that dose. Do not take double or extra doses. What may interact with this medicine? Do not take this medicine with any of the following medications: -epoetin alfa This list may not describe all possible interactions. Give your health care provider a list of all the medicines, herbs, non-prescription drugs, or dietary supplements you use. Also tell them if you smoke, drink alcohol, or use illegal drugs. Some items may interact with your medicine. What should I watch for while using this medicine? Visit your prescriber or health care professional for regular checks on your progress and for the needed blood tests and blood pressure measurements. It is especially important for the doctor to make sure your hemoglobin level is in the desired range, to limit the risk of potential side effects and to give you the best benefit. Keep all appointments for any recommended tests. Check your blood pressure as directed. Ask your doctor what your blood pressure should be and when you should contact him or her. As your body makes more red blood cells, you may need to take iron, folic acid, or vitamin B supplements. Ask your doctor or health care provider which products are right for you. If you have kidney disease continue dietary restrictions, even though this medication can make you feel better. Talk with your doctor or health   care professional about the foods you eat and the vitamins that you take. What side effects may I notice from receiving this medicine? Side effects that you should report to your doctor or health care professional as soon as possible: -allergic reactions like skin rash, itching or hives, swelling of the face, lips, or tongue -breathing problems -changes in vision -chest  pain -confusion, trouble speaking or understanding -feeling faint or lightheaded, falls -high blood pressure -muscle aches or pains -pain, swelling, warmth in the leg -rapid weight gain -severe headaches -sudden numbness or weakness of the face, arm or leg -trouble walking, dizziness, loss of balance or coordination -seizures (convulsions) -swelling of the ankles, feet, hands -unusually weak or tired Side effects that usually do not require medical attention (report to your doctor or health care professional if they continue or are bothersome): -diarrhea -fever, chills (flu-like symptoms) -headaches -nausea, vomiting -redness, stinging, or swelling at site where injected This list may not describe all possible side effects. Call your doctor for medical advice about side effects. You may report side effects to FDA at 1-800-FDA-1088. Where should I keep my medicine? Keep out of the reach of children. Store in a refrigerator between 2 and 8 degrees C (36 and 46 degrees F). Do not freeze. Do not shake. Throw away any unused portion if using a single-dose vial. Throw away any unused medicine after the expiration date. NOTE: This sheet is a summary. It may not cover all possible information. If you have questions about this medicine, talk to your doctor, pharmacist, or health care provider.  2015, [employer]. (2008-03-29 10:23:57)  

## 2017-07-03 ENCOUNTER — Encounter: Payer: Self-pay | Admitting: Hematology

## 2017-10-02 ENCOUNTER — Inpatient Hospital Stay: Payer: Medicare Other | Attending: Hematology

## 2017-10-02 ENCOUNTER — Inpatient Hospital Stay: Payer: Medicare Other

## 2017-10-02 VITALS — BP 122/56 | HR 67 | Temp 97.9°F | Resp 18

## 2017-10-02 DIAGNOSIS — D631 Anemia in chronic kidney disease: Secondary | ICD-10-CM

## 2017-10-02 DIAGNOSIS — N184 Chronic kidney disease, stage 4 (severe): Secondary | ICD-10-CM | POA: Diagnosis not present

## 2017-10-02 DIAGNOSIS — N183 Chronic kidney disease, stage 3 unspecified: Secondary | ICD-10-CM

## 2017-10-02 DIAGNOSIS — I129 Hypertensive chronic kidney disease with stage 1 through stage 4 chronic kidney disease, or unspecified chronic kidney disease: Secondary | ICD-10-CM | POA: Diagnosis not present

## 2017-10-02 LAB — CBC WITH DIFFERENTIAL/PLATELET
Basophils Absolute: 0 10*3/uL (ref 0.0–0.1)
Basophils Relative: 1 %
EOS ABS: 0.1 10*3/uL (ref 0.0–0.5)
EOS PCT: 4 %
HCT: 28.7 % — ABNORMAL LOW (ref 34.8–46.6)
Hemoglobin: 9.2 g/dL — ABNORMAL LOW (ref 11.6–15.9)
LYMPHS ABS: 0.6 10*3/uL — AB (ref 0.9–3.3)
Lymphocytes Relative: 19 %
MCH: 27.3 pg (ref 25.1–34.0)
MCHC: 32 g/dL (ref 31.5–36.0)
MCV: 85.3 fL (ref 79.5–101.0)
MONOS PCT: 13 %
Monocytes Absolute: 0.4 10*3/uL (ref 0.1–0.9)
Neutro Abs: 2.2 10*3/uL (ref 1.5–6.5)
Neutrophils Relative %: 63 %
PLATELETS: 103 10*3/uL — AB (ref 145–400)
RBC: 3.36 MIL/uL — ABNORMAL LOW (ref 3.70–5.45)
RDW: 14.5 % (ref 11.2–14.5)
WBC: 3.3 10*3/uL — ABNORMAL LOW (ref 3.9–10.3)

## 2017-10-02 LAB — IRON AND TIBC
IRON: 73 ug/dL (ref 41–142)
Saturation Ratios: 28 % (ref 21–57)
TIBC: 256 ug/dL (ref 236–444)
UIBC: 184 ug/dL

## 2017-10-02 LAB — COMPREHENSIVE METABOLIC PANEL
ALK PHOS: 64 U/L (ref 40–150)
ALT: 13 U/L (ref 0–55)
AST: 22 U/L (ref 5–34)
Albumin: 3.9 g/dL (ref 3.5–5.0)
Anion gap: 8 (ref 3–11)
BUN: 34 mg/dL — ABNORMAL HIGH (ref 7–26)
CHLORIDE: 107 mmol/L (ref 98–109)
CO2: 25 mmol/L (ref 22–29)
CREATININE: 1.81 mg/dL — AB (ref 0.60–1.10)
Calcium: 9.9 mg/dL (ref 8.4–10.4)
GFR calc Af Amer: 29 mL/min — ABNORMAL LOW (ref >60)
GFR, EST NON AFRICAN AMERICAN: 25 mL/min — AB (ref >60)
Glucose, Bld: 112 mg/dL (ref 70–140)
Potassium: 4 mmol/L (ref 3.5–5.1)
SODIUM: 140 mmol/L (ref 136–145)
Total Bilirubin: 0.4 mg/dL (ref 0.2–1.2)
Total Protein: 7.4 g/dL (ref 6.4–8.3)

## 2017-10-02 LAB — FERRITIN: Ferritin: 338 ng/mL — ABNORMAL HIGH (ref 9–269)

## 2017-10-02 MED ORDER — DARBEPOETIN ALFA 200 MCG/0.4ML IJ SOSY
PREFILLED_SYRINGE | INTRAMUSCULAR | Status: AC
Start: 2017-10-02 — End: 2017-10-02
  Filled 2017-10-02: qty 0.4

## 2017-10-02 MED ORDER — DARBEPOETIN ALFA 200 MCG/0.4ML IJ SOSY
200.0000 ug | PREFILLED_SYRINGE | Freq: Once | INTRAMUSCULAR | Status: AC
  Administered 2017-10-02: 200 ug via SUBCUTANEOUS

## 2017-10-27 ENCOUNTER — Encounter (HOSPITAL_COMMUNITY): Payer: Self-pay | Admitting: Emergency Medicine

## 2017-10-27 ENCOUNTER — Other Ambulatory Visit: Payer: Self-pay

## 2017-10-27 ENCOUNTER — Emergency Department (HOSPITAL_COMMUNITY)
Admission: EM | Admit: 2017-10-27 | Discharge: 2017-10-27 | Disposition: A | Discharge status: Home / Self Care | Payer: Medicare Other | Attending: Emergency Medicine | Admitting: Emergency Medicine

## 2017-10-27 DIAGNOSIS — E1122 Type 2 diabetes mellitus with diabetic chronic kidney disease: Secondary | ICD-10-CM | POA: Insufficient documentation

## 2017-10-27 DIAGNOSIS — E162 Hypoglycemia, unspecified: Secondary | ICD-10-CM

## 2017-10-27 DIAGNOSIS — Z7982 Long term (current) use of aspirin: Secondary | ICD-10-CM | POA: Insufficient documentation

## 2017-10-27 DIAGNOSIS — E119 Type 2 diabetes mellitus without complications: Secondary | ICD-10-CM | POA: Diagnosis not present

## 2017-10-27 DIAGNOSIS — Z7984 Long term (current) use of oral hypoglycemic drugs: Secondary | ICD-10-CM | POA: Insufficient documentation

## 2017-10-27 DIAGNOSIS — N390 Urinary tract infection, site not specified: Secondary | ICD-10-CM | POA: Diagnosis not present

## 2017-10-27 DIAGNOSIS — N184 Chronic kidney disease, stage 4 (severe): Secondary | ICD-10-CM | POA: Insufficient documentation

## 2017-10-27 DIAGNOSIS — I129 Hypertensive chronic kidney disease with stage 1 through stage 4 chronic kidney disease, or unspecified chronic kidney disease: Secondary | ICD-10-CM | POA: Insufficient documentation

## 2017-10-27 LAB — CBC WITH DIFFERENTIAL/PLATELET
BASOS ABS: 0 10*3/uL (ref 0.0–0.1)
BASOS PCT: 0 %
Eosinophils Absolute: 0 10*3/uL (ref 0.0–0.7)
Eosinophils Relative: 0 %
HEMATOCRIT: 31.8 % — AB (ref 36.0–46.0)
HEMOGLOBIN: 9.9 g/dL — AB (ref 12.0–15.0)
Lymphocytes Relative: 13 %
Lymphs Abs: 0.5 10*3/uL — ABNORMAL LOW (ref 0.7–4.0)
MCH: 27.5 pg (ref 26.0–34.0)
MCHC: 31.1 g/dL (ref 30.0–36.0)
MCV: 88.3 fL (ref 78.0–100.0)
MONOS PCT: 12 %
Monocytes Absolute: 0.4 10*3/uL (ref 0.1–1.0)
NEUTROS ABS: 2.7 10*3/uL (ref 1.7–7.7)
NEUTROS PCT: 75 %
Platelets: 123 10*3/uL — ABNORMAL LOW (ref 150–400)
RBC: 3.6 MIL/uL — AB (ref 3.87–5.11)
RDW: 16.1 % — ABNORMAL HIGH (ref 11.5–15.5)
WBC: 3.7 10*3/uL — ABNORMAL LOW (ref 4.0–10.5)

## 2017-10-27 LAB — URINALYSIS, ROUTINE W REFLEX MICROSCOPIC
Bilirubin Urine: NEGATIVE
GLUCOSE, UA: NEGATIVE mg/dL
Hgb urine dipstick: NEGATIVE
KETONES UR: NEGATIVE mg/dL
LEUKOCYTES UA: NEGATIVE
Nitrite: POSITIVE — AB
PH: 6 (ref 5.0–8.0)
Protein, ur: NEGATIVE mg/dL
Specific Gravity, Urine: 1.01 (ref 1.005–1.030)

## 2017-10-27 LAB — COMPREHENSIVE METABOLIC PANEL
ALK PHOS: 59 U/L (ref 38–126)
ALT: 14 U/L (ref 0–44)
ANION GAP: 4 — AB (ref 5–15)
AST: 19 U/L (ref 15–41)
Albumin: 3.7 g/dL (ref 3.5–5.0)
BUN: 44 mg/dL — ABNORMAL HIGH (ref 8–23)
CALCIUM: 9.2 mg/dL (ref 8.9–10.3)
CO2: 26 mmol/L (ref 22–32)
Chloride: 108 mmol/L (ref 98–111)
Creatinine, Ser: 1.9 mg/dL — ABNORMAL HIGH (ref 0.44–1.00)
GFR calc non Af Amer: 24 mL/min — ABNORMAL LOW (ref >60)
GFR, EST AFRICAN AMERICAN: 27 mL/min — AB (ref >60)
Glucose, Bld: 84 mg/dL (ref 70–99)
Potassium: 4.7 mmol/L (ref 3.5–5.1)
SODIUM: 138 mmol/L (ref 135–145)
TOTAL PROTEIN: 7.3 g/dL (ref 6.5–8.1)
Total Bilirubin: 0.4 mg/dL (ref 0.3–1.2)

## 2017-10-27 LAB — CBG MONITORING, ED
GLUCOSE-CAPILLARY: 117 mg/dL — AB (ref 70–99)
Glucose-Capillary: 72 mg/dL (ref 70–99)
Glucose-Capillary: 113 mg/dL — ABNORMAL HIGH (ref 70–99)
Glucose-Capillary: 72 mg/dL (ref 70–99)
Glucose-Capillary: 83 mg/dL (ref 70–99)

## 2017-10-27 LAB — TROPONIN I: Troponin I: <0.03 ng/mL (ref <0.03)

## 2017-10-27 MED ORDER — CEPHALEXIN 500 MG PO CAPS
500.0000 mg | ORAL_CAPSULE | Freq: Once | ORAL | Status: AC
  Administered 2017-10-27: 500 mg via ORAL
  Filled 2017-10-27: qty 1

## 2017-10-27 MED ORDER — CEPHALEXIN 500 MG PO CAPS: 500.0000 mg | ORAL_CAPSULE | Freq: Three times a day (TID) | ORAL | 0 refills | Status: DC

## 2017-10-27 NOTE — ED Triage Notes (Signed)
[facility] called out for hypoglycemia, family reports pt was unresponsive, 1 of glucagon admin by EMS, CBG then 61, 12.5g D50 admin by EMS, CBG then 128, upon arrival pt a/o

## 2017-10-27 NOTE — ED Notes (Signed)
CBG rechecked per EDP, resulted at 72. Pt given 4oz of juice and peanut butter crackers. EDP notified.

## 2017-10-27 NOTE — ED Notes (Signed)
Pt given 4oz of juice per MD. Breakfast tray coming for pt.

## 2017-10-27 NOTE — Discharge Instructions (Addendum)
Don't take your glimepiride today. Monitor your blood sugars closely today and try to eat throughout the day. Restart your glimepiride tomorrow morning, unless your blood sugars are consistently lower today, then wait another day before you restart it. Take the antibiotics until gone. Recheck if you get a fever, vomiting, abdominal pain or your blood sugars keep getting too low again. Return if any problems

## 2017-10-27 NOTE — ED Provider Notes (Addendum)
Signout from Dr. Tomi Bamberger.  83 year old female diabetic on oral medications here with a low blood sugar events.  It sounds like for the last couple days she has had some low blood sugars and EMS has been to the house a few times.  This time the patient's blood sugar was 32.  She is had some screening labs and has a possible UTI and is been started on some antibiotics.  Plan is that she would be given regular meal and check her blood sugar again in about an hour.  If she continues to stay normal blood sugar and asymptomatic she can be discharged on antibiotics with the understanding she needs to hold 1 of her diabetes medicines today and follow-up with her doctor.  If her blood sugars continue to drop she may require admission.  Clinical Course as of Nov 10 1220  Mon Oct 27, 2017  1232 Patient had a repeat blood sugar now of 72 after her initial one of 110.  She remains asymptomatic and is eating again.   [MB]  3903 Patient is feeling completely well is eating now again and would like to be discharged.  She understands the concerns that we have as far as her blood sugars dropping but she said she would continue to eat and monitor them.  Talked about taking way 1 of her blood pressure medicines until she follows up with her PCP and she is comfortable with this plan.   [MB]    Clinical Course User Index [MB] Hayden Rasmussen, MD      Hayden Rasmussen, MD 10/28/17 1031    Hayden Rasmussen, MD 11/10/17 [phone]

## 2017-10-27 NOTE — ED Provider Notes (Addendum)
[facility] EMERGENCY DEPARTMENT Provider Note   CSN: [id number] Arrival date & time: 10/27/17  0507  Time seen 05:20 AM   History   Chief Complaint Chief Complaint  Patient presents with  . Hypoglycemia    HPI Lindsey Mcguire is a 83 y.o. female.  HPI EMS reports they were called out to the patient's house for unresponsive.  They state when they got there her CBG was 32.  They reports sonorous respirations.  They were having trouble getting IV access and gave her 1 mg of glucagon.  She still was lethargic and they ended up giving her 12-1/2 g of D50.  Her last CBG was 129.  EMS also reports they were also called to her house the night before for the same symptoms, but she refused transport at that time.  Patient states she has been diabetic for a while.  She denies any change in her diet or change in her medications.  She denies feeling bad and states she felt fine all day.  She states when she checks her blood sugars in the morning they are usually 100 up to 104.  Sometimes in the evening it can be 140 up to 150.  Her family reports she was sleeping soundly and her tongue was hanging out of her mouth.  They were unable to wake her up.  They states she was diaphoretic.  Patient states she is taking glimepiride and Actos.  She denies nausea, vomiting, diarrhea, sore throat, coughing, fever, abdominal pain.  PCP Leeroy Cha, MD [facility]  Past Medical History:  Diagnosis Date  . Anemia 10/2003  . Diabetes mellitus   . Hypertension     Patient Active Problem List   Diagnosis Date Noted  . ICH (intracerebral hemorrhage) ([place]) 02/23/2017  . Intracranial hemorrhage ([place]) 02/23/2017  . Type 2 diabetes mellitus with stage 4 chronic kidney disease, without long-term current use of insulin ([place]) 09/24/2016  . Anemia due to chronic renal failure treated with erythropoietin 02/16/2011    Past Surgical History:  Procedure Laterality Date  . CATARACT EXTRACTION   12/06/2003   left eye  . TUBAL LIGATION  1977  . YAG LASER APPLICATION Right 09/23/7822   Procedure: YAG LASER APPLICATION;  Surgeon: Elta Guadeloupe T. Gershon Crane, MD;  Location: [facility];  Service: Ophthalmology;  Laterality: Right;     OB History   None      Home Medications    Prior to Admission medications   Medication Sig Start Date End Date Taking? Authorizing Provider  alendronate (FOSAMAX) 70 MG tablet Take 70 mg by mouth once a week. Take with a full glass of water on an empty stomach.   Yes [provider]  amLODipine (NORVASC) 10 MG tablet Take 5 mg by mouth daily.    Yes [provider]  aspirin 81 MG tablet Take 81 mg by mouth daily.    Yes [provider]  atorvastatin (LIPITOR) 40 MG tablet Take 40 mg by mouth daily.   Yes [provider]  Calcium Carb-Cholecalciferol [phone] MG-UNIT CAPS Take 2 tablets by mouth every morning.    Yes [provider]  ferrous sulfate 325 (65 FE) MG tablet Take 650 mg by mouth daily with breakfast.    Yes [provider]  glimepiride (AMARYL) 1 MG tablet Take 1 mg by mouth daily with breakfast.   Yes [provider]  Multiple Vitamin (MULTIVITAMIN) tablet Take 1 tablet by mouth daily.   Yes [provider]  olmesartan-hydrochlorothiazide (BENICAR HCT) 40-12.5 MG tablet Take 1 tablet by mouth daily.   Yes [provider]  pioglitazone (ACTOS) 45 MG tablet Take 45 mg by mouth daily.   Yes [provider]  Polyethyl Glycol-Propyl Glycol (SYSTANE OP) Place 2 drops into both eyes 4 (four) times daily as needed (dryness).    Yes [provider]  Polyethylene Glycol 3350 (MIRALAX PO) Take 17 g by mouth daily as needed (constipation).    Yes [provider]  vitamin E 400 UNIT capsule Take 400 Units by mouth daily.    Yes [provider]  cephALEXin (KEFLEX) 500 MG capsule Take 1 capsule (500 mg total) by mouth 3 (three) times daily. 10/27/17   Rolland Porter, MD    Family History Family History  Problem Relation Age of Onset  . Diabetes Mother   . Hypertension Mother   . Cancer Brother   . Diabetes Brother   . Hypertension Maternal Grandmother   . Diabetes Maternal Grandmother   . Cancer Brother   . Diabetes Brother     Social History Social History   Tobacco Use  . Smoking status: Never Smoker  . Smokeless tobacco: Never Used  Substance Use Topics  . Alcohol use: No  . Drug use: No  lives at home Lives with husband and GS   Allergies   Patient has no known allergies.   Review of Systems Review of Systems  All other systems reviewed and are negative.    Physical Exam Updated Vital Signs BP (!) 111/54 (BP Location: Right Arm)   Pulse 87   Temp 97.9 F (36.6 C) (Oral)   Resp 18   Ht 5' (1.524 m)   Wt 78.9 kg (174 lb)   SpO2 99%   BMI 33.98 kg/m   Vital signs normal    Physical Exam  Constitutional: She is oriented to person, place, and time. She appears well-developed and well-nourished.  Non-toxic appearance. She does not appear ill. No distress.  Patient was ambulatory to the bathroom without distress.  HENT:  Head: Normocephalic and atraumatic.  Right Ear: External ear normal.  Left Ear: External ear normal.  Nose: Nose normal. No mucosal edema or rhinorrhea.  Mouth/Throat: Oropharynx is clear and moist and mucous membranes are normal. No dental abscesses or uvula swelling.  Eyes: Pupils are equal, round, and reactive to light. Conjunctivae and EOM are normal.  Neck: Normal range of motion and full passive range of motion without pain. Neck supple.  Cardiovascular: Normal rate, regular rhythm and normal heart sounds. Exam reveals no gallop and no friction rub.  No murmur heard. Pulmonary/Chest: Effort normal and breath sounds normal. No respiratory distress. She has no wheezes. She has no rhonchi. She has no rales. She exhibits no tenderness and no crepitus.  Abdominal: Soft. Normal appearance and  bowel sounds are normal. She exhibits no distension. There is no tenderness. There is no rebound and no guarding.  Musculoskeletal: Normal range of motion. She exhibits no edema or tenderness.  Moves all extremities well.   Neurological: She is alert and oriented to person, place, and time. She has normal strength. No cranial nerve deficit.  Skin: Skin is warm, dry and intact. No rash noted. No erythema. No pallor.  Psychiatric: She has a normal mood and affect. Her speech is normal and behavior is normal. Her mood appears not anxious.  Nursing note and vitals reviewed.    ED Treatments / Results  Labs (all labs ordered are  listed, but only abnormal results are displayed) Results for orders placed or performed during the hospital encounter of 10/27/17  Comprehensive metabolic panel  Result Value Ref Range   Sodium 138 135 - 145 mmol/L   Potassium 4.7 3.5 - 5.1 mmol/L   Chloride 108 98 - 111 mmol/L   CO2 26 22 - 32 mmol/L   Glucose, Bld 84 70 - 99 mg/dL   BUN 44 (H) 8 - 23 mg/dL   Creatinine, Ser 1.90 (H) 0.44 - 1.00 mg/dL   Calcium 9.2 8.9 - 10.3 mg/dL   Total Protein 7.3 6.5 - 8.1 g/dL   Albumin 3.7 3.5 - 5.0 g/dL   AST 19 15 - 41 U/L   ALT 14 0 - 44 U/L   Alkaline Phosphatase 59 38 - 126 U/L   Total Bilirubin 0.4 0.3 - 1.2 mg/dL   GFR calc non Af Amer 24 (L) >60 mL/min   GFR calc Af Amer 27 (L) >60 mL/min   Anion gap 4 (L) 5 - 15  CBC with Differential  Result Value Ref Range   WBC 3.7 (L) 4.0 - 10.5 K/uL   RBC 3.60 (L) 3.87 - 5.11 MIL/uL   Hemoglobin 9.9 (L) 12.0 - 15.0 g/dL   HCT 31.8 (L) 36.0 - 46.0 %   MCV 88.3 78.0 - 100.0 fL   MCH 27.5 26.0 - 34.0 pg   MCHC 31.1 30.0 - 36.0 g/dL   RDW 16.1 (H) 11.5 - 15.5 %   Platelets 123 (L) 150 - 400 K/uL   Neutrophils Relative % 75 %   Neutro Abs 2.7 1.7 - 7.7 K/uL   Lymphocytes Relative 13 %   Lymphs Abs 0.5 (L) 0.7 - 4.0 K/uL   Monocytes Relative 12 %   Monocytes Absolute 0.4 0.1 - 1.0 K/uL   Eosinophils Relative 0 %    Eosinophils Absolute 0.0 0.0 - 0.7 K/uL   Basophils Relative 0 %   Basophils Absolute 0.0 0.0 - 0.1 K/uL  Troponin I  Result Value Ref Range   Troponin I <0.03 <0.03 ng/mL  Urinalysis, Routine w reflex microscopic  Result Value Ref Range   Color, Urine YELLOW YELLOW   APPearance CLEAR CLEAR   Specific Gravity, Urine 1.010 1.005 - 1.030   pH 6.0 5.0 - 8.0   Glucose, UA NEGATIVE NEGATIVE mg/dL   Hgb urine dipstick NEGATIVE NEGATIVE   Bilirubin Urine NEGATIVE NEGATIVE   Ketones, ur NEGATIVE NEGATIVE mg/dL   Protein, ur NEGATIVE NEGATIVE mg/dL   Nitrite POSITIVE (A) NEGATIVE   Leukocytes, UA NEGATIVE NEGATIVE   RBC / HPF 0-5 0 - 5 RBC/hpf   WBC, UA 0-5 0 - 5 WBC/hpf   Bacteria, UA MANY (A) NONE SEEN   Squamous Epithelial / LPF 0-5 0 - 5   Mucus PRESENT    Hyaline Casts, UA PRESENT   CBG monitoring, ED  Result Value Ref Range   Glucose-Capillary 117 (H) 70 - 99 mg/dL   Comment 1 Notify RN    Comment 2 Document in Chart   CBG monitoring, ED  Result Value Ref Range   Glucose-Capillary 72 70 - 99 mg/dL   Laboratory interpretation all normal except prob UTI, urine culture sent, renal insufficiency    EKG EKG Interpretation  Date/Time:  Monday October 27 2017 05:53:32 EDT Ventricular Rate:  69 PR Interval:    QRS Duration: 120 QT Interval:  387 QTC Calculation: 415 R Axis:   161 Text Interpretation:  Sinus rhythm IRBBB and  LPFB No significant change since last tracing 19 Feb 2008 Confirmed by Rolland Porter [phone]) on 10/27/2017 6:17:08 AM   Radiology No results found.  Procedures .Critical Care Performed by: Rolland Porter, MD Authorized by: Rolland Porter, MD   Critical care provider statement:    Critical care time (minutes):  31   Critical care was necessary to treat or prevent imminent or life-threatening deterioration of the following conditions:  Endocrine crisis   Critical care was time spent personally by me on the following activities:  Examination of patient, obtaining  history from patient or surrogate, ordering and review of laboratory studies and re-evaluation of patient's condition   (including critical care time)  Medications Ordered in ED Medications  cephALEXin (KEFLEX) capsule 500 mg (500 mg Oral Given 10/27/17 3291)     Initial Impression / Assessment and Plan / ED Course  I have reviewed the triage vital signs and the nursing notes.  Pertinent labs & imaging results that were available during my care of the patient were reviewed by me and considered in my medical decision making (see chart for details).    Patient was given something to eat and drink.  Laboratory testing was done.  6:20 AM patient was informed her urine was suspicious for UTI.  She was started on oral Keflex.  Nursing staff was reminded she needed to eat.  7:25 AM patient's blood sugar is dropping, it is now 72.  An hour before it was 46 and when she arrived to the ED it was 117.  She has eaten  crackers and peanut butter.  We are getting her a breakfast tray from the cafeteria to eat.  We talked about not taking her glimepiride today and to monitor her BS today and restart it tomorrow.   Recheck at 7:50 AM patient finished eating about 5 or 10 minutes ago.  She ate a breakfast tray and ate most of it.  We discussed repeating her CBG in about an hour and if her CBGs continue to be below 100 she will need to be admitted.  Pt left with Dr Melina Copa at change of shift at 8 am to make sure her CBG's continue to improve after she eats, if so she can go home. If they continue to be low she may need to be admitted to obs today for close glucose monitoring.   Final Clinical Impressions(s) / ED Diagnoses   Final diagnoses:  Hypoglycemia  Urinary tract infection without hematuria, site unspecified    ED Discharge Orders        Ordered    cephALEXin (KEFLEX) 500 MG capsule  3 times daily     10/27/17 9166      Rolland Porter, MD, Sedonia Small, Daleen Bo, MD 10/27/17 0600    Rolland Porter, MD 10/27/17 [phone]

## 2017-10-29 LAB — URINE CULTURE: Special Requests: NORMAL

## 2017-10-30 ENCOUNTER — Telehealth: Payer: Self-pay | Admitting: *Deleted

## 2017-10-30 NOTE — Telephone Encounter (Signed)
Post ED Visit - Positive Culture Follow-up  Culture report reviewed by antimicrobial stewardship pharmacist:  []  Elenor Quinones, Pharm.D. []  Heide Guile, Pharm.D., BCPS AQ-ID []  Parks Neptune, Pharm.D., BCPS []  Alycia Rossetti, Pharm.D., BCPS []  [place], Pharm.D., BCPS, AAHIVP []  Legrand Como, Pharm.D., BCPS, AAHIVP []  Salome Arnt, PharmD, BCPS []  Johnnette Gourd, PharmD, BCPS []  Hughes Better, PharmD, BCPS []  Leeroy Cha, PharmD Lucio Edward, PharmD  Positive urine culture Treated with Cephalexin, organism sensitive to the same and no further patient follow-up is required at this time.  Harlon Flor [facility] 10/30/2017, 10:28 AM

## 2018-01-02 ENCOUNTER — Inpatient Hospital Stay: Payer: Medicare Other

## 2018-01-02 ENCOUNTER — Inpatient Hospital Stay: Payer: Medicare Other | Attending: Hematology

## 2018-01-02 VITALS — BP 132/60 | HR 67 | Temp 97.9°F | Resp 18

## 2018-01-02 DIAGNOSIS — N183 Chronic kidney disease, stage 3 unspecified: Secondary | ICD-10-CM

## 2018-01-02 DIAGNOSIS — N184 Chronic kidney disease, stage 4 (severe): Secondary | ICD-10-CM | POA: Diagnosis not present

## 2018-01-02 DIAGNOSIS — D631 Anemia in chronic kidney disease: Secondary | ICD-10-CM | POA: Diagnosis not present

## 2018-01-02 DIAGNOSIS — I129 Hypertensive chronic kidney disease with stage 1 through stage 4 chronic kidney disease, or unspecified chronic kidney disease: Secondary | ICD-10-CM | POA: Insufficient documentation

## 2018-01-02 LAB — CBC WITH DIFFERENTIAL/PLATELET
Basophils Absolute: 0 K/uL (ref 0.0–0.1)
Basophils Relative: 1 %
Eosinophils Absolute: 0.1 K/uL (ref 0.0–0.5)
Eosinophils Relative: 3 %
HCT: 30.5 % — ABNORMAL LOW (ref 34.8–46.6)
Hemoglobin: 9.8 g/dL — ABNORMAL LOW (ref 11.6–15.9)
Lymphocytes Relative: 17 %
Lymphs Abs: 0.6 K/uL — ABNORMAL LOW (ref 0.9–3.3)
MCH: 27.6 pg (ref 25.1–34.0)
MCHC: 32.1 g/dL (ref 31.5–36.0)
MCV: 86.1 fL (ref 79.5–101.0)
Monocytes Absolute: 0.5 K/uL (ref 0.1–0.9)
Monocytes Relative: 13 %
Neutro Abs: 2.5 K/uL (ref 1.5–6.5)
Neutrophils Relative %: 66 %
Platelets: 124 K/uL — ABNORMAL LOW (ref 145–400)
RBC: 3.54 MIL/uL — ABNORMAL LOW (ref 3.70–5.45)
RDW: 14.5 % (ref 11.2–14.5)
WBC: 3.7 K/uL — ABNORMAL LOW (ref 3.9–10.3)

## 2018-01-02 LAB — FERRITIN: Ferritin: 318 ng/mL — ABNORMAL HIGH (ref 11–307)

## 2018-01-02 MED ORDER — DARBEPOETIN ALFA 200 MCG/0.4ML IJ SOSY
200.0000 ug | PREFILLED_SYRINGE | Freq: Once | INTRAMUSCULAR | Status: AC
  Administered 2018-01-02: 200 ug via SUBCUTANEOUS

## 2018-01-02 MED ORDER — DARBEPOETIN ALFA 200 MCG/0.4ML IJ SOSY
PREFILLED_SYRINGE | INTRAMUSCULAR | Status: AC
  Filled 2018-01-02: qty 0.4

## 2018-01-02 NOTE — Patient Instructions (Signed)

## 2018-01-02 NOTE — Progress Notes (Signed)
Pt. Tolerated injection well, No further problems or concerns noted. 

## 2018-01-28 ENCOUNTER — Other Ambulatory Visit (HOSPITAL_COMMUNITY): Payer: Self-pay | Admitting: Internal Medicine

## 2018-01-28 DIAGNOSIS — Z1231 Encounter for screening mammogram for malignant neoplasm of breast: Secondary | ICD-10-CM

## 2018-03-09 ENCOUNTER — Encounter (HOSPITAL_COMMUNITY): Payer: Self-pay

## 2018-03-09 ENCOUNTER — Ambulatory Visit (HOSPITAL_COMMUNITY)
Admission: RE | Admit: 2018-03-09 | Discharge: 2018-03-09 | Disposition: A | Discharge status: Home / Self Care | Payer: Medicare Other | Source: Ambulatory Visit | Attending: Internal Medicine | Admitting: Internal Medicine

## 2018-03-09 DIAGNOSIS — Z1231 Encounter for screening mammogram for malignant neoplasm of breast: Secondary | ICD-10-CM | POA: Diagnosis present

## 2018-03-30 ENCOUNTER — Telehealth: Payer: Self-pay | Admitting: Hematology

## 2018-03-30 NOTE — Telephone Encounter (Signed)
R/s appt per 12/1 sch message - pt is aware of apt date and time

## 2018-04-03 ENCOUNTER — Other Ambulatory Visit: Payer: Medicare Other

## 2018-04-03 ENCOUNTER — Ambulatory Visit: Payer: Medicare Other

## 2018-04-03 ENCOUNTER — Ambulatory Visit: Payer: Medicare Other | Admitting: Hematology

## 2018-04-07 ENCOUNTER — Telehealth: Payer: Self-pay | Admitting: Hematology

## 2018-04-07 ENCOUNTER — Inpatient Hospital Stay: Payer: Medicare Other

## 2018-04-07 ENCOUNTER — Inpatient Hospital Stay: Payer: Medicare Other | Attending: Hematology

## 2018-04-07 ENCOUNTER — Inpatient Hospital Stay: Payer: Medicare Other | Admitting: Hematology

## 2018-04-07 ENCOUNTER — Encounter: Payer: Self-pay | Admitting: Hematology

## 2018-04-07 VITALS — BP 130/63 | HR 80 | Temp 99.8°F | Resp 20 | Ht 60.0 in | Wt 168.2 lb

## 2018-04-07 DIAGNOSIS — E039 Hypothyroidism, unspecified: Secondary | ICD-10-CM | POA: Diagnosis not present

## 2018-04-07 DIAGNOSIS — M1990 Unspecified osteoarthritis, unspecified site: Secondary | ICD-10-CM | POA: Insufficient documentation

## 2018-04-07 DIAGNOSIS — Z7982 Long term (current) use of aspirin: Secondary | ICD-10-CM | POA: Diagnosis not present

## 2018-04-07 DIAGNOSIS — I129 Hypertensive chronic kidney disease with stage 1 through stage 4 chronic kidney disease, or unspecified chronic kidney disease: Secondary | ICD-10-CM | POA: Insufficient documentation

## 2018-04-07 DIAGNOSIS — D72819 Decreased white blood cell count, unspecified: Secondary | ICD-10-CM | POA: Diagnosis not present

## 2018-04-07 DIAGNOSIS — N183 Chronic kidney disease, stage 3 unspecified: Secondary | ICD-10-CM

## 2018-04-07 DIAGNOSIS — D696 Thrombocytopenia, unspecified: Secondary | ICD-10-CM | POA: Diagnosis not present

## 2018-04-07 DIAGNOSIS — D631 Anemia in chronic kidney disease: Secondary | ICD-10-CM

## 2018-04-07 DIAGNOSIS — Z79899 Other long term (current) drug therapy: Secondary | ICD-10-CM

## 2018-04-07 DIAGNOSIS — E1122 Type 2 diabetes mellitus with diabetic chronic kidney disease: Secondary | ICD-10-CM | POA: Diagnosis not present

## 2018-04-07 DIAGNOSIS — N184 Chronic kidney disease, stage 4 (severe): Secondary | ICD-10-CM

## 2018-04-07 LAB — COMPREHENSIVE METABOLIC PANEL
ALT: 13 U/L (ref 0–44)
ANION GAP: 10 (ref 5–15)
AST: 18 U/L (ref 15–41)
Albumin: 3.9 g/dL (ref 3.5–5.0)
Alkaline Phosphatase: 77 U/L (ref 38–126)
BUN: 39 mg/dL — AB (ref 8–23)
CHLORIDE: 108 mmol/L (ref 98–111)
CO2: 24 mmol/L (ref 22–32)
Calcium: 9.4 mg/dL (ref 8.9–10.3)
Creatinine, Ser: 2.28 mg/dL — ABNORMAL HIGH (ref 0.44–1.00)
GFR calc Af Amer: 23 mL/min — ABNORMAL LOW (ref >60)
GFR calc non Af Amer: 19 mL/min — ABNORMAL LOW (ref >60)
Glucose, Bld: 109 mg/dL — ABNORMAL HIGH (ref 70–99)
POTASSIUM: 4.3 mmol/L (ref 3.5–5.1)
SODIUM: 142 mmol/L (ref 135–145)
Total Bilirubin: 0.4 mg/dL (ref 0.3–1.2)
Total Protein: 7.7 g/dL (ref 6.5–8.1)

## 2018-04-07 LAB — CBC WITH DIFFERENTIAL/PLATELET
ABS IMMATURE GRANULOCYTES: 0 10*3/uL (ref 0.00–0.07)
Basophils Absolute: 0 10*3/uL (ref 0.0–0.1)
Basophils Relative: 1 %
Eosinophils Absolute: 0.1 10*3/uL (ref 0.0–0.5)
Eosinophils Relative: 4 %
HCT: 28.3 % — ABNORMAL LOW (ref 36.0–46.0)
HEMOGLOBIN: 9 g/dL — AB (ref 12.0–15.0)
IMMATURE GRANULOCYTES: 0 %
LYMPHS PCT: 20 %
Lymphs Abs: 0.7 10*3/uL (ref 0.7–4.0)
MCH: 27.6 pg (ref 26.0–34.0)
MCHC: 31.8 g/dL (ref 30.0–36.0)
MCV: 86.8 fL (ref 80.0–100.0)
MONO ABS: 0.5 10*3/uL (ref 0.1–1.0)
MONOS PCT: 14 %
NEUTROS ABS: 2.2 10*3/uL (ref 1.7–7.7)
Neutrophils Relative %: 61 %
PLATELETS: 113 10*3/uL — AB (ref 150–400)
RBC: 3.26 MIL/uL — AB (ref 3.87–5.11)
RDW: 14.9 % (ref 11.5–15.5)
WBC: 3.6 10*3/uL — ABNORMAL LOW (ref 4.0–10.5)
nRBC: 0 % (ref 0.0–0.2)

## 2018-04-07 LAB — IRON AND TIBC
Iron: 62 ug/dL (ref 41–142)
SATURATION RATIOS: 22 % (ref 21–57)
TIBC: 279 ug/dL (ref 236–444)
UIBC: 217 ug/dL (ref 120–384)

## 2018-04-07 LAB — FERRITIN: FERRITIN: 432 ng/mL — AB (ref 11–307)

## 2018-04-07 MED ORDER — DARBEPOETIN ALFA 200 MCG/0.4ML IJ SOSY
200.0000 ug | PREFILLED_SYRINGE | Freq: Once | INTRAMUSCULAR | Status: AC
  Administered 2018-04-07: 200 ug via SUBCUTANEOUS

## 2018-04-07 NOTE — Telephone Encounter (Signed)
Printed calendar and avs. °

## 2018-04-07 NOTE — Progress Notes (Signed)
[place]   Telephone:(336) [phone] Fax:(336) [phone]   Clinic Follow up Note   Patient Care Team: Leeroy Cha, MD as PCP - General (Internal Medicine) Estanislado Emms, MD as Consulting Physician (Nephrology)  Date of Service:  04/07/2018  CHIEF COMPLAINT: F/u for anemia   CURRENT THERAPY:  Aranesp injections every 3 months, increased to every 2 months on 04/07/18 dur to worsening anemia   INTERVAL HISTORY:  Lindsey Mcguire is here for a follow up of her anemia. She was last seen by me 10 months ago.  She presents to the clinic today by herself. She notes she is doing well overall. She notes having hypothyroidism and is not on medication currently. She is being seen by Dr. Buddy Duty. She does not want to start medication for this right now. She notes her last A1C was 6.7. She denies any major changes in her energy level.  She notes having watery eyes. She notes having osteoporosis and no longer drives herself.    REVIEW OF SYSTEMS:   Constitutional: Denies fevers, chills or abnormal weight loss Eyes: Denies blurriness of vision (+) watery eyes  Ears, nose, mouth, throat, and face: Denies mucositis or sore throat Respiratory: Denies cough, dyspnea or wheezes Cardiovascular: Denies palpitation, chest discomfort or lower extremity swelling MSK: (+) Osteomiosis  Gastrointestinal:  Denies nausea, heartburn or change in bowel habits Skin: Denies abnormal skin rashes Lymphatics: Denies new lymphadenopathy or easy bruising Neurological:Denies numbness, tingling or new weaknesses Behavioral/Psych: Mood is stable, no new changes  All other systems were reviewed with the patient and are negative.  MEDICAL HISTORY:  Past Medical History:  Diagnosis Date  . Anemia 10/2003  . Diabetes mellitus   . Hypertension     SURGICAL HISTORY: Past Surgical History:  Procedure Laterality Date  . CATARACT EXTRACTION  12/06/2003   left eye  . TUBAL LIGATION  1977  . YAG  LASER APPLICATION Right 2/40/9735   Procedure: YAG LASER APPLICATION;  Surgeon: Elta Guadeloupe T. Gershon Crane, MD;  Location: [facility];  Service: Ophthalmology;  Laterality: Right;    I have reviewed the social history and family history with the patient and they are unchanged from previous note.  ALLERGIES:  has No Known Allergies.  MEDICATIONS:  Current Outpatient Medications  Medication Sig Dispense Refill  . alendronate (FOSAMAX) 35 MG tablet Take 35 mg by mouth daily.    Marland Kitchen amLODipine (NORVASC) 5 MG tablet Take 5 mg by mouth daily.  10  . aspirin 81 MG tablet Take 81 mg by mouth daily.     Marland Kitchen atorvastatin (LIPITOR) 40 MG tablet Take 40 mg by mouth daily.    . calcium carbonate (CALCIUM 600 HIGH POTENCY) 600 MG TABS tablet Take 600 mg by mouth 2 (two) times daily with a meal.    . cholecalciferol (VITAMIN D3) 25 MCG (1000 UT) tablet Take 1,000 Units by mouth daily.    . ferrous sulfate 325 (65 FE) MG tablet Take 650 mg by mouth daily with breakfast.     . Multiple Vitamin (MULTIVITAMIN) tablet Take 1 tablet by mouth daily.    Marland Kitchen olmesartan-hydrochlorothiazide (BENICAR HCT) 40-12.5 MG tablet Take 1 tablet by mouth daily.    . pioglitazone (ACTOS) 45 MG tablet Take 45 mg by mouth daily.    Vladimir Faster Glycol-Propyl Glycol (SYSTANE OP) Place 2 drops into both eyes 4 (four) times daily as needed (dryness).     . Polyethylene Glycol 3350 (MIRALAX PO) Take 17 g by mouth daily  as needed (constipation).     . vitamin E 400 UNIT capsule Take 400 Units by mouth daily.      No current facility-administered medications for this visit.     PHYSICAL EXAMINATION: ECOG PERFORMANCE STATUS: 1 - Symptomatic but completely ambulatory  Vitals:   04/07/18 1300  BP: 130/63  Pulse: 80  Resp: 20  Temp: 99.8 F (37.7 C)  SpO2: 99%   Filed Weights   04/07/18 1300  Weight: 168 lb 3.2 oz (76.3 kg)    GENERAL:alert, no distress and comfortable SKIN: skin color, texture, turgor are normal, no rashes or significant  lesions EYES: normal, Conjunctiva are pink and non-injected, sclera clear OROPHARYNX:no exudate, no erythema and lips, buccal mucosa, and tongue normal  NECK: supple, thyroid normal size, non-tender, without nodularity LYMPH:  no palpable lymphadenopathy in the cervical, axillary or inguinal LUNGS: clear to auscultation and percussion with normal breathing effort HEART: regular rate & rhythm and no murmurs and no lower extremity edema ABDOMEN:abdomen soft, non-tender and normal bowel sounds Musculoskeletal:no cyanosis of digits and no clubbing  NEURO: alert & oriented x 3 with fluent speech, no focal motor/sensory deficits  LABORATORY DATA:  I have reviewed the data as listed CBC Latest Ref Rng & Units 04/07/2018 01/02/2018 10/27/2017  WBC 4.0 - 10.5 K/uL 3.6(L) 3.7(L) 3.7(L)  Hemoglobin 12.0 - 15.0 g/dL 9.0(L) 9.8(L) 9.9(L)  Hematocrit 36.0 - 46.0 % 28.3(L) 30.5(L) 31.8(L)  Platelets 150 - 400 K/uL 113(L) 124(L) 123(L)     CMP Latest Ref Rng & Units 04/07/2018 10/27/2017 10/02/2017  Glucose 70 - 99 mg/dL 109(H) 84 112  BUN 8 - 23 mg/dL 39(H) 44(H) 34(H)  Creatinine 0.44 - 1.00 mg/dL 2.28(H) 1.90(H) 1.81(H)  Sodium 135 - 145 mmol/L 142 138 140  Potassium 3.5 - 5.1 mmol/L 4.3 4.7 4.0  Chloride 98 - 111 mmol/L 108 108 107  CO2 22 - 32 mmol/L _0 Calcium 8.9 - 10.3 mg/dL 9.4 9.2 9.9  Total Protein 6.5 - 8.1 g/dL 7.7 7.3 7.4  Total Bilirubin 0.3 - 1.2 mg/dL 0.4 0.4 0.4  Alkaline Phos 38 - 126 U/L 77 59 64  AST 15 - 41 U/L _1 ALT 0 - 44 U/L _2 RADIOGRAPHIC STUDIES: I have personally reviewed the radiological images as listed and agreed with the findings in the report. No results found.   ASSESSMENT & PLAN:  Lindsey Mcguire is a 83 y.o. female with    1. Anemia due to chronic renal failure treated with erythropoietin. -She is currently being treated with Aranesp injection every 3 months.  -Labs reviewed, Her Hg has dropped to 9 (previously around 10) and  her CMP and Iron panel is still pending.  -I discussed increasing her Aranesp injection to every 2 months. She is agreeable.  -She has had her flu shot for this year -She takes multivitamin and ferrous sulfate daily, will continue  -Proceed with Aranesp injection today  -F/u in 6 months  2. CKD stage IV  -She plans to get new nephrologist next year  -Kidney function is stable  3. Hypertension, diabetes, arthritis, osteoporosis   -Continue medications -F/u with PCP -Last HB A1c was 6.7   4. Hypothyroidism  -Managed by endocrinologist Dr. Buddy Duty -Plans to start Synthroid medication next year   5.  Mild leukocytopenia and thrombocytopenia -Patient has developed mild intermittent look cytopenia and thrombocytopenia, no significant neutropenia -Unclear etiology.  If gets worse, I  would consider a bone marrow biopsy to rule out MDS.  PLAN:  Lab and injection every 2 months X3 F/u in 6 months    No problem-specific Assessment & Plan notes found for this encounter.   No orders of the defined types were placed in this encounter.  All questions were answered. The patient knows to call the clinic with any problems, questions or concerns. No barriers to learning was detected. I spent 15 minutes counseling the patient face to face. The total time spent in the appointment was 20 minutes and more than 50% was on counseling and review of test results     Truitt Merle, MD 04/07/2018   I, Joslyn Devon, am acting as scribe for Truitt Merle, MD.   I have reviewed the above documentation for accuracy and completeness, and I agree with the above.

## 2018-04-08 ENCOUNTER — Telehealth: Payer: Self-pay

## 2018-04-08 NOTE — Telephone Encounter (Signed)
Attempted to contact patient regarding lab results, phone rang no answer and no answering machine.  Could not leave a message.  Patient's iron level is good, continue oral iron.

## 2018-04-08 NOTE — Telephone Encounter (Signed)
-----   Message from Truitt Merle, MD sent at 04/08/2018  7:28 AM EST ----- Please let pt know her iron level is good, continue oral iron, thanks   Truitt Merle  04/08/2018

## 2018-06-08 ENCOUNTER — Inpatient Hospital Stay: Payer: Medicare Other | Attending: Hematology

## 2018-06-08 ENCOUNTER — Inpatient Hospital Stay: Payer: Medicare Other

## 2018-06-08 VITALS — BP 119/52 | HR 73

## 2018-06-08 DIAGNOSIS — N183 Chronic kidney disease, stage 3 unspecified: Secondary | ICD-10-CM

## 2018-06-08 DIAGNOSIS — D631 Anemia in chronic kidney disease: Secondary | ICD-10-CM | POA: Diagnosis present

## 2018-06-08 DIAGNOSIS — N184 Chronic kidney disease, stage 4 (severe): Secondary | ICD-10-CM | POA: Insufficient documentation

## 2018-06-08 LAB — CBC WITH DIFFERENTIAL/PLATELET
Abs Immature Granulocytes: 0.01 10*3/uL (ref 0.00–0.07)
Basophils Absolute: 0 10*3/uL (ref 0.0–0.1)
Basophils Relative: 0 %
EOS ABS: 0.1 10*3/uL (ref 0.0–0.5)
Eosinophils Relative: 3 %
HEMATOCRIT: 29.6 % — AB (ref 36.0–46.0)
Hemoglobin: 9.3 g/dL — ABNORMAL LOW (ref 12.0–15.0)
IMMATURE GRANULOCYTES: 0 %
LYMPHS ABS: 0.8 10*3/uL (ref 0.7–4.0)
Lymphocytes Relative: 23 %
MCH: 27.2 pg (ref 26.0–34.0)
MCHC: 31.4 g/dL (ref 30.0–36.0)
MCV: 86.5 fL (ref 80.0–100.0)
MONO ABS: 0.5 10*3/uL (ref 0.1–1.0)
MONOS PCT: 14 %
NRBC: 0 % (ref 0.0–0.2)
Neutro Abs: 2.1 10*3/uL (ref 1.7–7.7)
Neutrophils Relative %: 60 %
Platelets: 130 10*3/uL — ABNORMAL LOW (ref 150–400)
RBC: 3.42 MIL/uL — ABNORMAL LOW (ref 3.87–5.11)
RDW: 15.4 % (ref 11.5–15.5)
WBC: 3.5 10*3/uL — AB (ref 4.0–10.5)

## 2018-06-08 MED ORDER — DARBEPOETIN ALFA 200 MCG/0.4ML IJ SOSY
200.0000 ug | PREFILLED_SYRINGE | Freq: Once | INTRAMUSCULAR | Status: AC
  Administered 2018-06-08: 200 ug via SUBCUTANEOUS

## 2018-08-07 ENCOUNTER — Inpatient Hospital Stay: Payer: Medicare Other | Attending: Hematology

## 2018-08-07 ENCOUNTER — Other Ambulatory Visit: Payer: Self-pay

## 2018-08-07 ENCOUNTER — Inpatient Hospital Stay: Payer: Medicare Other

## 2018-08-07 VITALS — BP 135/61 | HR 87 | Temp 99.3°F | Resp 18

## 2018-08-07 DIAGNOSIS — D631 Anemia in chronic kidney disease: Secondary | ICD-10-CM | POA: Diagnosis present

## 2018-08-07 DIAGNOSIS — N183 Chronic kidney disease, stage 3 (moderate): Principal | ICD-10-CM

## 2018-08-07 DIAGNOSIS — N184 Chronic kidney disease, stage 4 (severe): Secondary | ICD-10-CM | POA: Insufficient documentation

## 2018-08-07 LAB — CBC WITH DIFFERENTIAL/PLATELET
Abs Immature Granulocytes: 0.01 10*3/uL (ref 0.00–0.07)
Basophils Absolute: 0 10*3/uL (ref 0.0–0.1)
Basophils Relative: 1 %
Eosinophils Absolute: 0.1 10*3/uL (ref 0.0–0.5)
Eosinophils Relative: 2 %
HCT: 29.3 % — ABNORMAL LOW (ref 36.0–46.0)
Hemoglobin: 9.2 g/dL — ABNORMAL LOW (ref 12.0–15.0)
Immature Granulocytes: 0 %
Lymphocytes Relative: 15 %
Lymphs Abs: 0.6 10*3/uL — ABNORMAL LOW (ref 0.7–4.0)
MCH: 26.7 pg (ref 26.0–34.0)
MCHC: 31.4 g/dL (ref 30.0–36.0)
MCV: 84.9 fL (ref 80.0–100.0)
Monocytes Absolute: 0.5 10*3/uL (ref 0.1–1.0)
Monocytes Relative: 12 %
Neutro Abs: 3 10*3/uL (ref 1.7–7.7)
Neutrophils Relative %: 70 %
Platelets: 170 10*3/uL (ref 150–400)
RBC: 3.45 MIL/uL — ABNORMAL LOW (ref 3.87–5.11)
RDW: 15.6 % — ABNORMAL HIGH (ref 11.5–15.5)
WBC: 4.2 10*3/uL (ref 4.0–10.5)
nRBC: 0 % (ref 0.0–0.2)

## 2018-08-07 LAB — COMPREHENSIVE METABOLIC PANEL
ALT: 20 U/L (ref 0–44)
AST: 26 U/L (ref 15–41)
Albumin: 3.4 g/dL — ABNORMAL LOW (ref 3.5–5.0)
Alkaline Phosphatase: 112 U/L (ref 38–126)
Anion gap: 10 (ref 5–15)
BUN: 32 mg/dL — ABNORMAL HIGH (ref 8–23)
CO2: 24 mmol/L (ref 22–32)
Calcium: 9.1 mg/dL (ref 8.9–10.3)
Chloride: 106 mmol/L (ref 98–111)
Creatinine, Ser: 1.87 mg/dL — ABNORMAL HIGH (ref 0.44–1.00)
GFR calc Af Amer: 29 mL/min — ABNORMAL LOW (ref >60)
GFR calc non Af Amer: 25 mL/min — ABNORMAL LOW (ref >60)
Glucose, Bld: 167 mg/dL — ABNORMAL HIGH (ref 70–99)
Potassium: 3.8 mmol/L (ref 3.5–5.1)
Sodium: 140 mmol/L (ref 135–145)
Total Bilirubin: 0.4 mg/dL (ref 0.3–1.2)
Total Protein: 7.9 g/dL (ref 6.5–8.1)

## 2018-08-07 LAB — FERRITIN: Ferritin: 354 ng/mL — ABNORMAL HIGH (ref 11–307)

## 2018-08-07 MED ORDER — DARBEPOETIN ALFA 200 MCG/0.4ML IJ SOSY
200.0000 ug | PREFILLED_SYRINGE | Freq: Once | INTRAMUSCULAR | Status: AC
  Administered 2018-08-07: 200 ug via SUBCUTANEOUS

## 2018-08-07 MED ORDER — DARBEPOETIN ALFA 200 MCG/0.4ML IJ SOSY
PREFILLED_SYRINGE | INTRAMUSCULAR | Status: AC
  Filled 2018-08-07: qty 0.4

## 2018-08-10 ENCOUNTER — Telehealth: Payer: Self-pay | Admitting: *Deleted

## 2018-08-10 NOTE — Telephone Encounter (Signed)
-----   Message from Truitt Merle, MD sent at 08/09/2018  5:04 PM EDT ----- Please let pt know the lab results, anemia and CKD are stable, iron level adequate. No concerns, thanks   Truitt Merle  08/09/2018

## 2018-08-10 NOTE — Telephone Encounter (Signed)
Attempted TCT patient however  Call was not able to go through, even after several attempts. No alternate #'s available.  Unable to leave message

## 2018-10-05 NOTE — Progress Notes (Signed)
[place]   Telephone:(336) [phone] Fax:(336) [phone]   Clinic Follow up Note   Patient Care Team: Leeroy Cha, MD as PCP - General (Internal Medicine) Estanislado Emms, MD as Consulting Physician (Nephrology)  Date of Service:  10/07/2018  CHIEF COMPLAINT: F/u for anemia   CURRENT THERAPY:  Aranesp injections every 3 months, increased to every 2 months on 04/07/18 due to worsening anemia. Increased to 313mg every 6 weeks on 10/07/18.   INTERVAL HISTORY:  Lindsey BARREIRAis here for a follow up of anemia. She presents to the clinic with her husband. She notes she passed out in the front lobby of clinic earlier this morning. She almost fell but was able to sit down with help. She notes this happened in a super market last month. She notes seeing her nephrologist who started her on Lasix for her LE edema. She feels her swelling is better. She notes having SOB at times. Per her husband she sleeps more. More than half of the time she is up.  [place].    REVIEW OF SYSTEMS:   Constitutional: Denies fevers, chills or abnormal weight loss (+) fatigue  Eyes: Denies blurriness of vision Ears, nose, mouth, throat, and face: Denies mucositis or sore throat Respiratory: Denies cough or wheezes (+) SOB Cardiovascular: Denies palpitation, chest discomfort (+) Moderate lower extremity swelling, improved  Gastrointestinal:  Denies nausea, heartburn or change in bowel habits Skin: Denies abnormal skin rashes Lymphatics: Denies new lymphadenopathy or easy bruising Neurological:Denies numbness, tingling or new weaknesses Behavioral/Psych: Mood is stable, no new changes  All other systems were reviewed with the patient and are negative.  MEDICAL HISTORY:  Past Medical History:  Diagnosis Date  . Anemia 10/2003  . Diabetes mellitus   . Hypertension     SURGICAL HISTORY: Past Surgical History:   Procedure Laterality Date  . CATARACT EXTRACTION  12/06/2003   left eye  . TUBAL LIGATION  1977  . YAG LASER APPLICATION Right 77/37/1062  Procedure: YAG LASER APPLICATION;  Surgeon: MElta GuadeloupeT. SGershon Crane MD;  Location: [facility];  Service: Ophthalmology;  Laterality: Right;    I have reviewed the social history and family history with the patient and they are unchanged from previous note.  ALLERGIES:  has No Known Allergies.  MEDICATIONS:  Current Outpatient Medications  Medication Sig Dispense Refill  . alendronate (FOSAMAX) 35 MG tablet Take 35 mg by mouth daily.    .Marland KitchenamLODipine (NORVASC) 5 MG tablet Take 5 mg by mouth daily.  10  . aspirin 81 MG tablet Take 81 mg by mouth daily.     .Marland Kitchenatorvastatin (LIPITOR) 40 MG tablet Take 40 mg by mouth daily.    . calcium carbonate (CALCIUM 600 HIGH POTENCY) 600 MG TABS tablet Take 600 mg by mouth 2 (two) times daily with a meal.    . cholecalciferol (VITAMIN D3) 25 MCG (1000 UT) tablet Take 1,000 Units by mouth daily.    . ferrous sulfate 325 (65 FE) MG tablet Take 650 mg by mouth daily with breakfast.     . Multiple Vitamin (MULTIVITAMIN) tablet Take 1 tablet by mouth daily.    .Marland Kitchenolmesartan-hydrochlorothiazide (BENICAR HCT) 40-12.5 MG tablet Take 1 tablet by mouth daily.    . pioglitazone (ACTOS) 45 MG tablet Take 45 mg by mouth daily.    .Vladimir FasterGlycol-Propyl Glycol (SYSTANE OP) Place 2 drops into both eyes 4 (  four) times daily as needed (dryness).     . Polyethylene Glycol 3350 (MIRALAX PO) Take 17 g by mouth daily as needed (constipation).     . vitamin E 400 UNIT capsule Take 400 Units by mouth daily.     . furosemide (LASIX) 40 MG tablet Take 40 mg by mouth daily.     No current facility-administered medications for this visit.     PHYSICAL EXAMINATION: ECOG PERFORMANCE STATUS: 2 - Symptomatic, <50% confined to bed  Vitals:   10/07/18 1132  BP: 103/60  Pulse: 82  Resp: 18  Temp: 98.7 F (37.1 C)  SpO2: 95%   Filed Weights    10/07/18 1132  Weight: 167 lb (75.8 kg)    GENERAL:alert, no distress and comfortable SKIN: skin color, texture, turgor are normal, no rashes or significant lesions EYES: normal, Conjunctiva are pink and non-injected, sclera clear OROPHARYNX:no exudate, no erythema and lips, buccal mucosa, and tongue normal NECK: supple, thyroid normal size, non-tender, without nodularity LYMPH:  no palpable lymphadenopathy in the cervical, axillary  LUNGS: clear to auscultation and percussion with normal breathing effort HEART: regular rate & rhythm and no murmurs (+) Mild lower extremity pitting edema ABDOMEN:abdomen soft, non-tender and normal bowel sounds Musculoskeletal:no cyanosis of digits and no clubbing  NEURO: alert & oriented x 3 with fluent speech, no focal motor/sensory deficits  LABORATORY DATA:  I have reviewed the data as listed CBC Latest Ref Rng & Units 10/07/2018 08/07/2018 06/08/2018  WBC 4.0 - 10.5 K/uL 3.6(L) 4.2 3.5(L)  Hemoglobin 12.0 - 15.0 g/dL 8.6(L) 9.2(L) 9.3(L)  Hematocrit 36.0 - 46.0 % 26.4(L) 29.3(L) 29.6(L)  Platelets 150 - 400 K/uL 129(L) 170 130(L)     CMP Latest Ref Rng & Units 10/07/2018 08/07/2018 04/07/2018  Glucose 70 - 99 mg/dL 158(H) 167(H) 109(H)  BUN 8 - 23 mg/dL 42(H) 32(H) 39(H)  Creatinine 0.44 - 1.00 mg/dL 2.50(H) 1.87(H) 2.28(H)  Sodium 135 - 145 mmol/L 138 140 142  Potassium 3.5 - 5.1 mmol/L 3.2(L) 3.8 4.3  Chloride 98 - 111 mmol/L 98 106 108  CO2 22 - 32 mmol/L _0 Calcium 8.9 - 10.3 mg/dL 8.8(L) 9.1 9.4  Total Protein 6.5 - 8.1 g/dL 8.0 7.9 7.7  Total Bilirubin 0.3 - 1.2 mg/dL 0.6 0.4 0.4  Alkaline Phos 38 - 126 U/L 135(H) 112 77  AST 15 - 41 U/L _1 ALT 0 - 44 U/L _2 RADIOGRAPHIC STUDIES: I have personally reviewed the radiological images as listed and agreed with the findings in the report. No results found.   ASSESSMENT & PLAN:  Lindsey Mcguire is a 84 y.o. female with   1. Anemia due to chronic renal failure  treated with erythropoietin. -She is currently being treated with Aranesp injection every 2 months.  -Labs revewied today, CBC and CMP WNL except WBC 3.6, Hg 8.6, PLT 129K, K 3.2, BG 158, BUN 42, Cr 2.50, Ca 8.8, albumin 3.3, alk phos 135. Ferritin 444, Iron 39. Will proceed with aranesp injection today and increased dose to 349mg.  -I discussed increasing frequency of Aranesp injections to every 4 weeks but she is reluctant. I will increased dose to 3042m every 6 weeks and will reevaluate at next visit.  -F/u in 6 weeks.   2. Presyncope  -This morning she had presyncope episode possibly due to drop in BP.  -She was recently started on lasix by nephrologist for LE edema, and has  had intermittent dizziness, she did have an episode of presyncope at store last week  -we checked orthostatic BP, overall normal but her BP on lower side. I advised her to hold lasix for now.  -I encourage her to f/u with PCP if dizziness recurs    3. CKD stage IV  -She plans to get new nephrologist next year  -Kidney function is stable, Cr at 2.50, BUN 42 today (10/07/18)  4. Hypertension, diabetes, arthritis, osteoporosis   -Continue medications -F/u with PCP  5. Hypothyroidism  -Managed by endocrinologist Dr. Buddy Duty -Plans to start Synthroid medication next year   6. Mild leukocytopenia and thrombocytopenia -Patient previously developed mild intermittent look cytopenia and thrombocytopenia, no significant neutropenia -Unclear etiology. If gets worse, I would consider a bone marrow biopsy to rule out MDS.  -Labs revewied, WBC 3.6, PLT 129K today (10/07/18)    PLAN: -Labs revewied, Proceed with Aranesp injection today and increase to 379mg -I instructed her to stop Lasix, and f/u with PCP if she has recurrent dizziness  -Lab, f/u and injection in 6 weeks   No problem-specific Assessment & Plan notes found for this encounter.   No orders of the defined types were placed in this encounter.  All  questions were answered. The patient knows to call the clinic with any problems, questions or concerns. No barriers to learning was detected. I spent 20 minutes counseling the patient face to face. The total time spent in the appointment was 25 minutes and more than 50% was on counseling and review of test results     YTruitt Merle MD 10/07/2018   I, AJoslyn Devon am acting as scribe for YTruitt Merle MD.   I have reviewed the above documentation for accuracy and completeness, and I agree with the above.

## 2018-10-07 ENCOUNTER — Inpatient Hospital Stay: Payer: Medicare Other | Attending: Hematology

## 2018-10-07 ENCOUNTER — Inpatient Hospital Stay (HOSPITAL_BASED_OUTPATIENT_CLINIC_OR_DEPARTMENT_OTHER): Payer: Medicare Other | Admitting: Hematology

## 2018-10-07 ENCOUNTER — Other Ambulatory Visit: Payer: Self-pay

## 2018-10-07 ENCOUNTER — Encounter: Payer: Self-pay | Admitting: Hematology

## 2018-10-07 ENCOUNTER — Inpatient Hospital Stay (HOSPITAL_BASED_OUTPATIENT_CLINIC_OR_DEPARTMENT_OTHER): Payer: Medicare Other | Admitting: Medical

## 2018-10-07 ENCOUNTER — Inpatient Hospital Stay: Payer: Medicare Other

## 2018-10-07 VITALS — BP 103/60 | HR 82 | Temp 98.7°F | Resp 18 | Ht 60.0 in | Wt 167.0 lb

## 2018-10-07 DIAGNOSIS — D631 Anemia in chronic kidney disease: Secondary | ICD-10-CM

## 2018-10-07 DIAGNOSIS — N184 Chronic kidney disease, stage 4 (severe): Secondary | ICD-10-CM

## 2018-10-07 DIAGNOSIS — R42 Dizziness and giddiness: Secondary | ICD-10-CM

## 2018-10-07 DIAGNOSIS — E1122 Type 2 diabetes mellitus with diabetic chronic kidney disease: Secondary | ICD-10-CM

## 2018-10-07 LAB — CBC WITH DIFFERENTIAL/PLATELET
Abs Immature Granulocytes: 0.01 10*3/uL (ref 0.00–0.07)
Basophils Absolute: 0 10*3/uL (ref 0.0–0.1)
Basophils Relative: 0 %
Eosinophils Absolute: 0 10*3/uL (ref 0.0–0.5)
Eosinophils Relative: 1 %
HCT: 26.4 % — ABNORMAL LOW (ref 36.0–46.0)
Hemoglobin: 8.6 g/dL — ABNORMAL LOW (ref 12.0–15.0)
Immature Granulocytes: 0 %
Lymphocytes Relative: 16 %
Lymphs Abs: 0.6 10*3/uL — ABNORMAL LOW (ref 0.7–4.0)
MCH: 25.9 pg — ABNORMAL LOW (ref 26.0–34.0)
MCHC: 32.6 g/dL (ref 30.0–36.0)
MCV: 79.5 fL — ABNORMAL LOW (ref 80.0–100.0)
Monocytes Absolute: 0.3 10*3/uL (ref 0.1–1.0)
Monocytes Relative: 9 %
Neutro Abs: 2.6 10*3/uL (ref 1.7–7.7)
Neutrophils Relative %: 74 %
Platelets: 129 10*3/uL — ABNORMAL LOW (ref 150–400)
RBC: 3.32 MIL/uL — ABNORMAL LOW (ref 3.87–5.11)
RDW: 18.1 % — ABNORMAL HIGH (ref 11.5–15.5)
WBC: 3.6 10*3/uL — ABNORMAL LOW (ref 4.0–10.5)
nRBC: 0 % (ref 0.0–0.2)

## 2018-10-07 LAB — COMPREHENSIVE METABOLIC PANEL
ALT: 9 U/L (ref 0–44)
AST: 18 U/L (ref 15–41)
Albumin: 3.3 g/dL — ABNORMAL LOW (ref 3.5–5.0)
Alkaline Phosphatase: 135 U/L — ABNORMAL HIGH (ref 38–126)
Anion gap: 14 (ref 5–15)
BUN: 42 mg/dL — ABNORMAL HIGH (ref 8–23)
CO2: 26 mmol/L (ref 22–32)
Calcium: 8.8 mg/dL — ABNORMAL LOW (ref 8.9–10.3)
Chloride: 98 mmol/L (ref 98–111)
Creatinine, Ser: 2.5 mg/dL — ABNORMAL HIGH (ref 0.44–1.00)
GFR calc Af Amer: 20 mL/min — ABNORMAL LOW (ref >60)
GFR calc non Af Amer: 17 mL/min — ABNORMAL LOW (ref >60)
Glucose, Bld: 158 mg/dL — ABNORMAL HIGH (ref 70–99)
Potassium: 3.2 mmol/L — ABNORMAL LOW (ref 3.5–5.1)
Sodium: 138 mmol/L (ref 135–145)
Total Bilirubin: 0.6 mg/dL (ref 0.3–1.2)
Total Protein: 8 g/dL (ref 6.5–8.1)

## 2018-10-07 LAB — FERRITIN: Ferritin: 444 ng/mL — ABNORMAL HIGH (ref 11–307)

## 2018-10-07 LAB — IRON AND TIBC
Iron: 39 ug/dL — ABNORMAL LOW (ref 41–142)
Saturation Ratios: 15 % — ABNORMAL LOW (ref 21–57)
TIBC: 256 ug/dL (ref 236–444)
UIBC: 217 ug/dL (ref 120–384)

## 2018-10-07 MED ORDER — DARBEPOETIN ALFA 300 MCG/0.6ML IJ SOSY
PREFILLED_SYRINGE | INTRAMUSCULAR | Status: AC
  Filled 2018-10-07: qty 0.6

## 2018-10-07 MED ORDER — DARBEPOETIN ALFA 300 MCG/0.6ML IJ SOSY
300.0000 ug | PREFILLED_SYRINGE | Freq: Once | INTRAMUSCULAR | Status: AC
  Administered 2018-10-07: 300 ug via SUBCUTANEOUS

## 2018-10-07 NOTE — Progress Notes (Signed)
Patient walking into cancer center. Became dizzy. Began to fall forward. Patient was assisted to a seated position on the floor by patient who was immediately behind her. No LOC. No injuries. Mrs. Lindsey Mcguire was begun on a "fluid pill" within the last month. She is a diabetic and only had applesauce this morning before her visit. [name] was assessed. VS were stable. She was A&O. She was helped into a wheelchair and was taken to registration for labs and visit today.   Sandi Mealy, MHS, PA-C Physician Assistant

## 2018-10-07 NOTE — Patient Instructions (Signed)

## 2018-10-08 ENCOUNTER — Telehealth: Payer: Self-pay | Admitting: Hematology

## 2018-10-08 NOTE — Telephone Encounter (Signed)
Scheduled appt per 6/10 los. Was not able to reach patient. A calendar will be mailed out.

## 2018-11-02 ENCOUNTER — Other Ambulatory Visit (HOSPITAL_COMMUNITY): Payer: Self-pay | Admitting: Nephrology

## 2018-11-02 DIAGNOSIS — R600 Localized edema: Secondary | ICD-10-CM

## 2018-11-04 ENCOUNTER — Other Ambulatory Visit: Payer: Self-pay | Admitting: Nephrology

## 2018-11-05 ENCOUNTER — Other Ambulatory Visit: Payer: Self-pay | Admitting: Nephrology

## 2018-11-05 DIAGNOSIS — M7989 Other specified soft tissue disorders: Secondary | ICD-10-CM

## 2018-11-05 DIAGNOSIS — R609 Edema, unspecified: Secondary | ICD-10-CM

## 2018-11-06 ENCOUNTER — Other Ambulatory Visit (HOSPITAL_COMMUNITY): Payer: Medicare Other

## 2018-11-09 ENCOUNTER — Ambulatory Visit (HOSPITAL_COMMUNITY)
Admission: RE | Admit: 2018-11-09 | Discharge: 2018-11-09 | Disposition: A | Discharge status: Home / Self Care | Payer: Medicare Other | Source: Ambulatory Visit | Attending: Nephrology | Admitting: Nephrology

## 2018-11-09 ENCOUNTER — Other Ambulatory Visit: Payer: Self-pay

## 2018-11-09 DIAGNOSIS — R600 Localized edema: Secondary | ICD-10-CM

## 2018-11-09 NOTE — Progress Notes (Signed)
*  PRELIMINARY RESULTS* Echocardiogram 2D Echocardiogram has been performed.  Lindsey Mcguire 11/09/2018, 3:56 PM

## 2018-11-11 ENCOUNTER — Other Ambulatory Visit: Payer: Self-pay | Admitting: Nephrology

## 2018-11-11 ENCOUNTER — Other Ambulatory Visit (HOSPITAL_COMMUNITY): Payer: Self-pay | Admitting: Nephrology

## 2018-11-11 DIAGNOSIS — R600 Localized edema: Secondary | ICD-10-CM

## 2018-11-11 DIAGNOSIS — N183 Chronic kidney disease, stage 3 unspecified: Secondary | ICD-10-CM

## 2018-11-16 ENCOUNTER — Other Ambulatory Visit: Payer: Self-pay

## 2018-11-16 ENCOUNTER — Ambulatory Visit (HOSPITAL_COMMUNITY)
Admission: RE | Admit: 2018-11-16 | Discharge: 2018-11-16 | Disposition: A | Discharge status: Home / Self Care | Payer: Medicare Other | Source: Ambulatory Visit | Attending: Nephrology | Admitting: Nephrology

## 2018-11-16 DIAGNOSIS — N183 Chronic kidney disease, stage 3 unspecified: Secondary | ICD-10-CM

## 2018-11-16 DIAGNOSIS — R600 Localized edema: Secondary | ICD-10-CM | POA: Insufficient documentation

## 2018-11-19 ENCOUNTER — Inpatient Hospital Stay: Payer: Medicare Other | Admitting: Hematology

## 2018-11-19 ENCOUNTER — Inpatient Hospital Stay: Payer: Medicare Other

## 2018-12-14 NOTE — Progress Notes (Signed)
[place]   [phone] [phone]) [phone]   Clinic Follow up Note   Patient Care Team: Leeroy Cha, MD as PCP - General (Internal Medicine) Estanislado Emms, MD as Consulting Physician (Nephrology)  Date of Service:  12/16/2018  CHIEF COMPLAINT: F/u for anemia  CURRENT THERAPY:  Aranesp injections every54month, increased to every 2 months on 12/10/19due to worsening anemia. Increased to 3060m every 6 weeks on 10/07/18.   INTERVAL HISTORY:  Lindsey Mcguire here for a follow up anemia. She presents to the clinic alone. She notes she is doing fair. [facility] She was ambulating at the time a dizzy spell came over her and she fell out. This time after falling her right breast was swollen and tender. She feels swelling went down a little. She notes she has a walker at home, but does not use it.  She notes abdominal hernia which she has had for years. She lately feel fullness or bloating. She notes SOB.    REVIEW OF SYSTEMS:   Constitutional: Denies fevers, chills or abnormal weight loss Eyes: Denies blurriness of vision Ears, nose, mouth, throat, and face: Denies mucositis or sore throat Respiratory: Denies cough or wheezes (+) SOB  Cardiovascular: Denies palpitation, chest discomfort or lower extremity swelling Gastrointestinal:  Denies nausea, heartburn or change in bowel habits Skin: Denies abnormal skin rashes Lymphatics: Denies new lymphadenopathy or easy bruising Neurological:Denies numbness, tingling or new weaknesses Behavioral/Psych: Mood is stable, no new changes  Breast:  All other systems were reviewed with the patient and are negative.  MEDICAL HISTORY:  Past Medical History:  Diagnosis Date  . Anemia 10/2003  . Diabetes mellitus   . Hypertension     SURGICAL HISTORY: Past Surgical History:  Procedure Laterality Date  . CATARACT EXTRACTION  12/06/2003   left eye  . TUBAL  LIGATION  1977  . YAG LASER APPLICATION Right 10/30/98/7622 Procedure: YAG LASER APPLICATION;  Surgeon: MaElta Guadeloupe. ShGershon CraneMD;  Location: [facility];  Service: Ophthalmology;  Laterality: Right;    I have reviewed the social history and family history with the patient and they are unchanged from previous note.  ALLERGIES:  has No Known Allergies.  MEDICATIONS:  Current Outpatient Medications  Medication Sig Dispense Refill  . amLODipine (NORVASC) 5 MG tablet Take 5 mg by mouth daily.  10  . aspirin 81 MG tablet Take 81 mg by mouth daily.     . Marland Kitchentorvastatin (LIPITOR) 40 MG tablet Take 40 mg by mouth daily.    . calcium carbonate (CALCIUM 600 HIGH POTENCY) 600 MG TABS tablet Take 600 mg by mouth 2 (two) times daily with a meal.    . cholecalciferol (VITAMIN D3) 25 MCG (1000 UT) tablet Take 1,000 Units by mouth daily.    . ferrous sulfate 325 (65 FE) MG tablet Take 650 mg by mouth daily with breakfast.     . furosemide (LASIX) 40 MG tablet Take 40 mg by mouth daily.    . Multiple Vitamin (MULTIVITAMIN) tablet Take 1 tablet by mouth daily.    . Marland Kitchenlmesartan-hydrochlorothiazide (BENICAR HCT) 40-12.5 MG tablet Take 1 tablet by mouth daily.    . pioglitazone (ACTOS) 45 MG tablet Take 45 mg by mouth daily.    . Vladimir Fasterlycol-Propyl Glycol (SYSTANE OP) Place 2 drops into both eyes 4 (four) times daily as needed (dryness).     . Polyethylene Glycol 3350 (MIRALAX PO) Take 17 g  by mouth daily as needed (constipation).      No current facility-administered medications for this visit.     PHYSICAL EXAMINATION: ECOG PERFORMANCE STATUS: 2 - Symptomatic, <50% confined to bed  Vitals:   12/16/18 1000  BP: 123/72  Pulse: 88  Resp: 18  Temp: 99.1 F (37.3 C)  SpO2: 99%   Filed Weights   12/16/18 1000  Weight: 200 lb 6.4 oz (90.9 kg)    GENERAL:alert, no distress and comfortable SKIN: skin color, texture, turgor are normal, no rashes or significant lesions EYES: normal, Conjunctiva are pink and  non-injected, sclera clear  NECK: supple, thyroid normal size, non-tender, without nodularity LYMPH:  no palpable lymphadenopathy in the cervical, axillary  LUNGS: clear to auscultation and percussion with normal breathing effort HEART: regular rate & rhythm and no murmurs and no lower extremity edema ABDOMEN:abdomen soft, non-tender and normal bowel sounds Musculoskeletal:no cyanosis of digits and no clubbing  NEURO: alert & oriented x 3 with fluent speech, no focal motor/sensory deficits BREAST: (+) Right breast mildly swollen with tenderness (+) right breast skin mildly warm, no skin erythema. No palpable mass, nodules or adenopathy bilaterally. Left breast exam benign.   LABORATORY DATA:  I have reviewed the data as listed CBC Latest Ref Rng & Units 12/16/2018 10/07/2018 08/07/2018  WBC 4.0 - 10.5 K/uL 3.5(L) 3.6(L) 4.2  Hemoglobin 12.0 - 15.0 g/dL 9.2(L) 8.6(L) 9.2(L)  Hematocrit 36.0 - 46.0 % 27.7(L) 26.4(L) 29.3(L)  Platelets 150 - 400 K/uL 100(L) 129(L) 170     CMP Latest Ref Rng & Units 10/07/2018 08/07/2018 04/07/2018  Glucose 70 - 99 mg/dL 158(H) 167(H) 109(H)  BUN 8 - 23 mg/dL 42(H) 32(H) 39(H)  Creatinine 0.44 - 1.00 mg/dL 2.50(H) 1.87(H) 2.28(H)  Sodium 135 - 145 mmol/L 138 140 142  Potassium 3.5 - 5.1 mmol/L 3.2(L) 3.8 4.3  Chloride 98 - 111 mmol/L 98 106 108  CO2 22 - 32 mmol/L 26 24 24   Calcium 8.9 - 10.3 mg/dL 8.8(L) 9.1 9.4  Total Protein 6.5 - 8.1 g/dL 8.0 7.9 7.7  Total Bilirubin 0.3 - 1.2 mg/dL 0.6 0.4 0.4  Alkaline Phos 38 - 126 U/L 135(H) 112 77  AST 15 - 41 U/L 18 26 18   ALT 0 - 44 U/L 9 20 13       RADIOGRAPHIC STUDIES: I have personally reviewed the radiological images as listed and agreed with the findings in the report. No results found.   ASSESSMENT & PLAN:  Lindsey Mcguire is a 84 y.o. female with   1. Anemia due to chronic renal failure treated with erythropoietin. -Sheis currently being treated with Aranespinjection every66month. -On  exam today she has mild right breast swelling and warmness. I discussed this may be related to fluid build up in low part of her body as she lays on her right side. I do not think this is an infection, I recommend she lay other ways to help this resolve. -CBC reviewed, shows WBC 3.5, Hg 9.2, PLT 100K. Will proceed with aranesp injection today at 3080m and continue every 6 weeks.  -F/u in 4 months   2. Recurrent Presyncope, Fall -possibly due to drop in BP.  -She was recently started on lasix by nephrologist for LE edema, and has had intermittent dizziness, she did have an episode of presyncope at store a week prior. We checked orthostatic BP, overall normal but her BP on lower side. I advised her to hold lasix for now.  -She had another fall 2  weeks ago at [facility] with dizzy spell. She notes right breast being swollen and tender.  -On exam today she has mild right breast swelling and warmness. I discussed this may be related to fluid build up in her body as she lays on her right side.  -I advised her to f/u with her nephologist closely to monitor her BP. She is fine to restart Lasix to reduce her edema.  -I highly recommend she ambulate with walker, especially when she leaves the house to help her balance.   3. CKD stage IV  -Sheplans to get newnephrologist next year  -Kidney function is stable previously   4.Hypertension, diabetes, arthritis, osteoporosis -Continue medications -F/u with PCP  5. Hypothyroidism  -Managed by endocrinologist Dr. Buddy Duty -Plans to start Synthroid medication next year  6.Mild leukocytopenia and thrombocytopenia -Patient previously developed mild intermittent leukopenia and thrombocytopenia, no significant neutropenia -Unclear etiology. If gets worse, I would consider a bone marrow biopsy to rule out MDS.  -Labs reviewed, WBC 3.5, PLT 100K today (12/16/18), no recent infection or bleeding, will monitor    PLAN: -Labs reviewed, Proceed with  Aranesp injection today at 312mg and continue every 6 weeks  -she is on lasix and I instructed her to F/u with her physicians (PCP and nephologist).  -Lab and f/u in 18 weeks    No problem-specific Assessment & Plan notes found for this encounter.   No orders of the defined types were placed in this encounter.  All questions were answered. The patient knows to call the clinic with any problems, questions or concerns. No barriers to learning was detected. I spent 15 minutes counseling the patient face to face. The total time spent in the appointment was 20 minutes and more than 50% was on counseling and review of test results     YTruitt Merle MD 12/16/2018   I, AJoslyn Devon am acting as scribe for YTruitt Merle MD.   I have reviewed the above documentation for accuracy and completeness, and I agree with the above.

## 2018-12-16 ENCOUNTER — Other Ambulatory Visit: Payer: Self-pay

## 2018-12-16 ENCOUNTER — Encounter: Payer: Self-pay | Admitting: Hematology

## 2018-12-16 ENCOUNTER — Inpatient Hospital Stay: Payer: Medicare Other

## 2018-12-16 ENCOUNTER — Inpatient Hospital Stay: Payer: Medicare Other | Attending: Hematology

## 2018-12-16 ENCOUNTER — Inpatient Hospital Stay: Payer: Medicare Other | Admitting: Hematology

## 2018-12-16 VITALS — BP 123/72 | HR 88 | Temp 99.1°F | Resp 18 | Ht 60.0 in | Wt 200.4 lb

## 2018-12-16 DIAGNOSIS — D631 Anemia in chronic kidney disease: Secondary | ICD-10-CM

## 2018-12-16 DIAGNOSIS — N183 Chronic kidney disease, stage 3 unspecified: Secondary | ICD-10-CM

## 2018-12-16 DIAGNOSIS — N184 Chronic kidney disease, stage 4 (severe): Secondary | ICD-10-CM | POA: Diagnosis not present

## 2018-12-16 LAB — CBC WITH DIFFERENTIAL/PLATELET
Abs Immature Granulocytes: 0.02 10*3/uL (ref 0.00–0.07)
Basophils Absolute: 0 10*3/uL (ref 0.0–0.1)
Basophils Relative: 0 %
Eosinophils Absolute: 0 10*3/uL (ref 0.0–0.5)
Eosinophils Relative: 1 %
HCT: 27.7 % — ABNORMAL LOW (ref 36.0–46.0)
Hemoglobin: 9.2 g/dL — ABNORMAL LOW (ref 12.0–15.0)
Immature Granulocytes: 1 %
Lymphocytes Relative: 12 %
Lymphs Abs: 0.4 10*3/uL — ABNORMAL LOW (ref 0.7–4.0)
MCH: 26.7 pg (ref 26.0–34.0)
MCHC: 33.2 g/dL (ref 30.0–36.0)
MCV: 80.3 fL (ref 80.0–100.0)
Monocytes Absolute: 0.4 10*3/uL (ref 0.1–1.0)
Monocytes Relative: 12 %
Neutro Abs: 2.6 10*3/uL (ref 1.7–7.7)
Neutrophils Relative %: 74 %
Platelets: 100 10*3/uL — ABNORMAL LOW (ref 150–400)
RBC: 3.45 MIL/uL — ABNORMAL LOW (ref 3.87–5.11)
RDW: 18.5 % — ABNORMAL HIGH (ref 11.5–15.5)
WBC: 3.5 10*3/uL — ABNORMAL LOW (ref 4.0–10.5)
nRBC: 0 % (ref 0.0–0.2)

## 2018-12-16 MED ORDER — DARBEPOETIN ALFA 300 MCG/0.6ML IJ SOSY
300.0000 ug | PREFILLED_SYRINGE | Freq: Once | INTRAMUSCULAR | Status: AC
  Administered 2018-12-16: 300 ug via SUBCUTANEOUS

## 2018-12-16 MED ORDER — DARBEPOETIN ALFA 300 MCG/0.6ML IJ SOSY
PREFILLED_SYRINGE | INTRAMUSCULAR | Status: AC
  Filled 2018-12-16: qty 0.6

## 2018-12-16 NOTE — Patient Instructions (Signed)

## 2018-12-17 ENCOUNTER — Telehealth: Payer: Self-pay | Admitting: Hematology

## 2018-12-17 NOTE — Telephone Encounter (Signed)
Scheduled appt per 8/19 los.  Spoke with patient and they are aware of their appt date and time.

## 2019-01-26 ENCOUNTER — Emergency Department (HOSPITAL_COMMUNITY): Payer: Medicare Other

## 2019-01-26 ENCOUNTER — Emergency Department (HOSPITAL_COMMUNITY)
Admission: EM | Admit: 2019-01-26 | Discharge: 2019-01-26 | Disposition: A | Discharge status: Home / Self Care | Payer: Medicare Other | Attending: Emergency Medicine | Admitting: Emergency Medicine

## 2019-01-26 ENCOUNTER — Telehealth: Payer: Self-pay | Admitting: Hematology

## 2019-01-26 ENCOUNTER — Other Ambulatory Visit: Payer: Self-pay

## 2019-01-26 ENCOUNTER — Encounter (HOSPITAL_COMMUNITY): Payer: Self-pay

## 2019-01-26 DIAGNOSIS — Z5321 Procedure and treatment not carried out due to patient leaving prior to being seen by health care provider: Secondary | ICD-10-CM | POA: Insufficient documentation

## 2019-01-26 DIAGNOSIS — R2232 Localized swelling, mass and lump, left upper limb: Secondary | ICD-10-CM | POA: Diagnosis present

## 2019-01-26 NOTE — ED Notes (Signed)
Pt not in waiting room

## 2019-01-26 NOTE — Telephone Encounter (Signed)
Returned patient's phone call regarding 09/30 appointment, per patient's request appointment has been moved to 10/05.  Message to provider.

## 2019-01-26 NOTE — ED Triage Notes (Signed)
Pt having left hand swelling for the last week and a half. NAD.

## 2019-01-27 ENCOUNTER — Inpatient Hospital Stay: Payer: Medicare Other

## 2019-02-01 ENCOUNTER — Inpatient Hospital Stay: Payer: Medicare Other | Attending: Hematology

## 2019-02-01 ENCOUNTER — Other Ambulatory Visit: Payer: Self-pay

## 2019-02-01 ENCOUNTER — Inpatient Hospital Stay: Payer: Medicare Other

## 2019-02-01 VITALS — BP 138/77 | HR 83 | Temp 98.3°F | Resp 16

## 2019-02-01 DIAGNOSIS — D631 Anemia in chronic kidney disease: Secondary | ICD-10-CM

## 2019-02-01 DIAGNOSIS — N184 Chronic kidney disease, stage 4 (severe): Secondary | ICD-10-CM | POA: Insufficient documentation

## 2019-02-01 LAB — COMPREHENSIVE METABOLIC PANEL
ALT: 15 U/L (ref 0–44)
AST: 21 U/L (ref 15–41)
Albumin: 2.9 g/dL — ABNORMAL LOW (ref 3.5–5.0)
Alkaline Phosphatase: 172 U/L — ABNORMAL HIGH (ref 38–126)
Anion gap: 9 (ref 5–15)
BUN: 28 mg/dL — ABNORMAL HIGH (ref 8–23)
CO2: 25 mmol/L (ref 22–32)
Calcium: 8.6 mg/dL — ABNORMAL LOW (ref 8.9–10.3)
Chloride: 108 mmol/L (ref 98–111)
Creatinine, Ser: 1.98 mg/dL — ABNORMAL HIGH (ref 0.44–1.00)
GFR calc Af Amer: 27 mL/min — ABNORMAL LOW (ref >60)
GFR calc non Af Amer: 23 mL/min — ABNORMAL LOW (ref >60)
Glucose, Bld: 143 mg/dL — ABNORMAL HIGH (ref 70–99)
Potassium: 3.3 mmol/L — ABNORMAL LOW (ref 3.5–5.1)
Sodium: 142 mmol/L (ref 135–145)
Total Bilirubin: 0.8 mg/dL (ref 0.3–1.2)
Total Protein: 7.2 g/dL (ref 6.5–8.1)

## 2019-02-01 LAB — CBC WITH DIFFERENTIAL/PLATELET
Abs Immature Granulocytes: 0.01 10*3/uL (ref 0.00–0.07)
Basophils Absolute: 0 10*3/uL (ref 0.0–0.1)
Basophils Relative: 0 %
Eosinophils Absolute: 0 10*3/uL (ref 0.0–0.5)
Eosinophils Relative: 1 %
HCT: 31 % — ABNORMAL LOW (ref 36.0–46.0)
Hemoglobin: 10.2 g/dL — ABNORMAL LOW (ref 12.0–15.0)
Immature Granulocytes: 0 %
Lymphocytes Relative: 7 %
Lymphs Abs: 0.3 10*3/uL — ABNORMAL LOW (ref 0.7–4.0)
MCH: 27.1 pg (ref 26.0–34.0)
MCHC: 32.9 g/dL (ref 30.0–36.0)
MCV: 82.4 fL (ref 80.0–100.0)
Monocytes Absolute: 0.4 10*3/uL (ref 0.1–1.0)
Monocytes Relative: 10 %
Neutro Abs: 3.3 10*3/uL (ref 1.7–7.7)
Neutrophils Relative %: 82 %
Platelets: 109 10*3/uL — ABNORMAL LOW (ref 150–400)
RBC: 3.76 MIL/uL — ABNORMAL LOW (ref 3.87–5.11)
RDW: 17.8 % — ABNORMAL HIGH (ref 11.5–15.5)
WBC: 4 10*3/uL (ref 4.0–10.5)
nRBC: 0 % (ref 0.0–0.2)

## 2019-02-01 LAB — FERRITIN: Ferritin: 302 ng/mL (ref 11–307)

## 2019-02-01 MED ORDER — DARBEPOETIN ALFA 300 MCG/0.6ML IJ SOSY
300.0000 ug | PREFILLED_SYRINGE | Freq: Once | INTRAMUSCULAR | Status: AC
  Administered 2019-02-01: 12:00:00 300 ug via SUBCUTANEOUS

## 2019-02-01 MED ORDER — DARBEPOETIN ALFA 300 MCG/0.6ML IJ SOSY
PREFILLED_SYRINGE | INTRAMUSCULAR | Status: AC
  Filled 2019-02-01: qty 0.6

## 2019-02-01 NOTE — Patient Instructions (Signed)
Darbepoetin Alfa injection What is this medicine? DARBEPOETIN ALFA (dar be POE e tin AL fa) helps your body make more red blood cells. It is used to treat anemia caused by chronic kidney failure and chemotherapy. This medicine may be used for other purposes; ask your health care provider or pharmacist if you have questions. COMMON BRAND NAME(S): Aranesp What should I tell my health care provider before I take this medicine? They need to know if you have any of these conditions:  blood clotting disorders or history of blood clots  cancer patient not on chemotherapy  cystic fibrosis  heart disease, such as angina, heart failure, or a history of a heart attack  hemoglobin level of 12 g/dL or greater  high blood pressure  low levels of folate, iron, or vitamin B12  seizures  an unusual or allergic reaction to darbepoetin, erythropoietin, albumin, hamster proteins, latex, other medicines, foods, dyes, or preservatives  pregnant or trying to get pregnant  breast-feeding How should I use this medicine? This medicine is for injection into a vein or under the skin. It is usually given by a health care professional in a hospital or clinic setting. If you get this medicine at home, you will be taught how to prepare and give this medicine. Use exactly as directed. Take your medicine at regular intervals. Do not take your medicine more often than directed. It is important that you put your used needles and syringes in a special sharps container. Do not put them in a trash can. If you do not have a sharps container, call your pharmacist or healthcare provider to get one. A special MedGuide will be given to you by the pharmacist with each prescription and refill. Be sure to read this information carefully each time. Talk to your pediatrician regarding the use of this medicine in children. While this medicine may be used in children as young as 1 month of age for selected conditions, precautions do  apply. Overdosage: If you think you have taken too much of this medicine contact a poison control center or emergency room at once. NOTE: This medicine is only for you. Do not share this medicine with others. What if I miss a dose? If you miss a dose, take it as soon as you can. If it is almost time for your next dose, take only that dose. Do not take double or extra doses. What may interact with this medicine? Do not take this medicine with any of the following medications:  epoetin alfa This list may not describe all possible interactions. Give your health care provider a list of all the medicines, herbs, non-prescription drugs, or dietary supplements you use. Also tell them if you smoke, drink alcohol, or use illegal drugs. Some items may interact with your medicine. What should I watch for while using this medicine? Your condition will be monitored carefully while you are receiving this medicine. You may need blood work done while you are taking this medicine. This medicine may cause a decrease in vitamin B6. You should make sure that you get enough vitamin B6 while you are taking this medicine. Discuss the foods you eat and the vitamins you take with your health care professional. What side effects may I notice from receiving this medicine? Side effects that you should report to your doctor or health care professional as soon as possible:  allergic reactions like skin rash, itching or hives, swelling of the face, lips, or tongue  breathing problems  changes in   vision  chest pain  confusion, trouble speaking or understanding  feeling faint or lightheaded, falls  high blood pressure  muscle aches or pains  pain, swelling, warmth in the leg  rapid weight gain  severe headaches  sudden numbness or weakness of the face, arm or leg  trouble walking, dizziness, loss of balance or coordination  seizures (convulsions)  swelling of the ankles, feet, hands  unusually weak or  tired Side effects that usually do not require medical attention (report to your doctor or health care professional if they continue or are bothersome):  diarrhea  fever, chills (flu-like symptoms)  headaches  nausea, vomiting  redness, stinging, or swelling at site where injected This list may not describe all possible side effects. Call your doctor for medical advice about side effects. You may report side effects to FDA at 1-800-FDA-1088. Where should I keep my medicine? Keep out of the reach of children. Store in a refrigerator between 2 and 8 degrees C (36 and 46 degrees F). Do not freeze. Do not shake. Throw away any unused portion if using a single-dose vial. Throw away any unused medicine after the expiration date. NOTE: This sheet is a summary. It may not cover all possible information. If you have questions about this medicine, talk to your doctor, pharmacist, or health care provider.  2020 Elsevier/Gold Standard (2017-04-30 16:44:20)  

## 2019-02-02 ENCOUNTER — Telehealth: Payer: Self-pay

## 2019-02-02 ENCOUNTER — Other Ambulatory Visit: Payer: Self-pay | Admitting: Hematology

## 2019-02-02 NOTE — Telephone Encounter (Signed)
-----   Message from Truitt Merle, MD sent at 02/02/2019  3:08 PM EDT ----- Please let pt know her K is low. She is on lasix, but I do not see KCL on her med list, please also fax the lab results to her MD who prescribe Lasix for her. Thanks   Truitt Merle  02/02/2019

## 2019-02-02 NOTE — Telephone Encounter (Signed)
Spoke with patient's husband to let her know her potassium is low per Dr. Burr Medico.  I am faxing her lab results to her PCP  Dr. Leeroy Cha today for his review. They may want to adjust her medications.  He verbalized an understanding.

## 2019-02-05 ENCOUNTER — Inpatient Hospital Stay (HOSPITAL_COMMUNITY)
Admission: EM | Admit: 2019-02-05 | Discharge: 2019-02-12 | DRG: 291 | Disposition: A | Discharge status: Home Health | Payer: Medicare Other | Attending: Internal Medicine | Admitting: Internal Medicine

## 2019-02-05 ENCOUNTER — Encounter (HOSPITAL_COMMUNITY): Payer: Self-pay | Admitting: Emergency Medicine

## 2019-02-05 ENCOUNTER — Emergency Department (HOSPITAL_COMMUNITY): Payer: Medicare Other

## 2019-02-05 ENCOUNTER — Other Ambulatory Visit: Payer: Self-pay

## 2019-02-05 DIAGNOSIS — I130 Hypertensive heart and chronic kidney disease with heart failure and stage 1 through stage 4 chronic kidney disease, or unspecified chronic kidney disease: Secondary | ICD-10-CM | POA: Diagnosis not present

## 2019-02-05 DIAGNOSIS — D631 Anemia in chronic kidney disease: Secondary | ICD-10-CM | POA: Diagnosis present

## 2019-02-05 DIAGNOSIS — Z833 Family history of diabetes mellitus: Secondary | ICD-10-CM

## 2019-02-05 DIAGNOSIS — Z7982 Long term (current) use of aspirin: Secondary | ICD-10-CM

## 2019-02-05 DIAGNOSIS — N183 Chronic kidney disease, stage 3 unspecified: Secondary | ICD-10-CM | POA: Diagnosis present

## 2019-02-05 DIAGNOSIS — J90 Pleural effusion, not elsewhere classified: Secondary | ICD-10-CM

## 2019-02-05 DIAGNOSIS — I5033 Acute on chronic diastolic (congestive) heart failure: Secondary | ICD-10-CM | POA: Diagnosis present

## 2019-02-05 DIAGNOSIS — N189 Chronic kidney disease, unspecified: Secondary | ICD-10-CM

## 2019-02-05 DIAGNOSIS — I509 Heart failure, unspecified: Secondary | ICD-10-CM

## 2019-02-05 DIAGNOSIS — Z6837 Body mass index (BMI) 37.0-37.9, adult: Secondary | ICD-10-CM

## 2019-02-05 DIAGNOSIS — Z79899 Other long term (current) drug therapy: Secondary | ICD-10-CM

## 2019-02-05 DIAGNOSIS — D696 Thrombocytopenia, unspecified: Secondary | ICD-10-CM | POA: Diagnosis present

## 2019-02-05 DIAGNOSIS — Z9889 Other specified postprocedural states: Secondary | ICD-10-CM

## 2019-02-05 DIAGNOSIS — E1122 Type 2 diabetes mellitus with diabetic chronic kidney disease: Secondary | ICD-10-CM | POA: Diagnosis present

## 2019-02-05 DIAGNOSIS — Z20828 Contact with and (suspected) exposure to other viral communicable diseases: Secondary | ICD-10-CM | POA: Diagnosis present

## 2019-02-05 DIAGNOSIS — Z8249 Family history of ischemic heart disease and other diseases of the circulatory system: Secondary | ICD-10-CM

## 2019-02-05 DIAGNOSIS — E669 Obesity, unspecified: Secondary | ICD-10-CM | POA: Diagnosis present

## 2019-02-05 DIAGNOSIS — G9349 Other encephalopathy: Secondary | ICD-10-CM | POA: Diagnosis present

## 2019-02-05 LAB — COMPREHENSIVE METABOLIC PANEL
ALT: 18 U/L (ref 0–44)
AST: 26 U/L (ref 15–41)
Albumin: 3.1 g/dL — ABNORMAL LOW (ref 3.5–5.0)
Alkaline Phosphatase: 168 U/L — ABNORMAL HIGH (ref 38–126)
Anion gap: 12 (ref 5–15)
BUN: 23 mg/dL (ref 8–23)
CO2: 23 mmol/L (ref 22–32)
Calcium: 8.9 mg/dL (ref 8.9–10.3)
Chloride: 106 mmol/L (ref 98–111)
Creatinine, Ser: 1.53 mg/dL — ABNORMAL HIGH (ref 0.44–1.00)
GFR calc Af Amer: 36 mL/min — ABNORMAL LOW (ref >60)
GFR calc non Af Amer: 31 mL/min — ABNORMAL LOW (ref >60)
Glucose, Bld: 167 mg/dL — ABNORMAL HIGH (ref 70–99)
Potassium: 3.3 mmol/L — ABNORMAL LOW (ref 3.5–5.1)
Sodium: 141 mmol/L (ref 135–145)
Total Bilirubin: 1.2 mg/dL (ref 0.3–1.2)
Total Protein: 7.6 g/dL (ref 6.5–8.1)

## 2019-02-05 LAB — URINALYSIS, ROUTINE W REFLEX MICROSCOPIC
Glucose, UA: NEGATIVE mg/dL
Ketones, ur: NEGATIVE mg/dL
Nitrite: NEGATIVE
Protein, ur: 30 mg/dL — AB
Specific Gravity, Urine: 1.025 (ref 1.005–1.030)
pH: 5.5 (ref 5.0–8.0)

## 2019-02-05 LAB — BLOOD GAS, VENOUS
Acid-Base Excess: 1.6 mmol/L (ref 0.0–2.0)
Bicarbonate: 24.7 mmol/L (ref 20.0–28.0)
FIO2: 28
O2 Saturation: 69.5 %
Patient temperature: 37
pCO2, Ven: 51.3 mmHg (ref 44.0–60.0)
pH, Ven: 7.338 (ref 7.250–7.430)
pO2, Ven: 43.8 mmHg (ref 32.0–45.0)

## 2019-02-05 LAB — RAPID URINE DRUG SCREEN, HOSP PERFORMED
Amphetamines: NOT DETECTED
Barbiturates: NOT DETECTED
Benzodiazepines: NOT DETECTED
Cocaine: NOT DETECTED
Opiates: NOT DETECTED
Tetrahydrocannabinol: NOT DETECTED

## 2019-02-05 LAB — D-DIMER, QUANTITATIVE: D-Dimer, Quant: 2.74 ug/mL-FEU — ABNORMAL HIGH (ref 0.00–0.50)

## 2019-02-05 LAB — CBC WITH DIFFERENTIAL/PLATELET
Abs Immature Granulocytes: 0.02 10*3/uL (ref 0.00–0.07)
Basophils Absolute: 0 10*3/uL (ref 0.0–0.1)
Basophils Relative: 1 %
Eosinophils Absolute: 0 10*3/uL (ref 0.0–0.5)
Eosinophils Relative: 1 %
HCT: 36 % (ref 36.0–46.0)
Hemoglobin: 11.2 g/dL — ABNORMAL LOW (ref 12.0–15.0)
Immature Granulocytes: 1 %
Lymphocytes Relative: 7 %
Lymphs Abs: 0.3 10*3/uL — ABNORMAL LOW (ref 0.7–4.0)
MCH: 26.8 pg (ref 26.0–34.0)
MCHC: 31.1 g/dL (ref 30.0–36.0)
MCV: 86.1 fL (ref 80.0–100.0)
Monocytes Absolute: 0.5 10*3/uL (ref 0.1–1.0)
Monocytes Relative: 12 %
Neutro Abs: 3.5 10*3/uL (ref 1.7–7.7)
Neutrophils Relative %: 78 %
Platelets: 118 10*3/uL — ABNORMAL LOW (ref 150–400)
RBC: 4.18 MIL/uL (ref 3.87–5.11)
RDW: 19.5 % — ABNORMAL HIGH (ref 11.5–15.5)
WBC: 4.4 10*3/uL (ref 4.0–10.5)
nRBC: 0 % (ref 0.0–0.2)

## 2019-02-05 LAB — TROPONIN I (HIGH SENSITIVITY): Troponin I (High Sensitivity): 29 ng/L — ABNORMAL HIGH (ref <18)

## 2019-02-05 LAB — URINALYSIS, MICROSCOPIC (REFLEX)

## 2019-02-05 LAB — ETHANOL: Alcohol, Ethyl (B): <10 mg/dL (ref <10)

## 2019-02-05 LAB — BRAIN NATRIURETIC PEPTIDE: B Natriuretic Peptide: 535 pg/mL — ABNORMAL HIGH (ref 0.0–100.0)

## 2019-02-05 NOTE — ED Triage Notes (Signed)
Ems was called out earlier for sob and pt refused to be evaluated so family took out ivc paperwork. Per family pt is not compliant with home meds and has been having visual and auditory hallucinations. When ems arrived the 2nd time with police pt's o2 was [id number] on room air, but pt has not complaints at this time.

## 2019-02-05 NOTE — ED Notes (Signed)
Attempted x 3 for IV access without success.  Oncoming nurse made aware.

## 2019-02-05 NOTE — ED Triage Notes (Signed)
Pt from home via [facility] after family members took IVC papers out due to pt being non compliant with her meds at home and has increased swelling to all extremeties.  Pt states she does not know why her family sent her here. Pt denies complaints

## 2019-02-05 NOTE — ED Provider Notes (Signed)
[facility] EMERGENCY DEPARTMENT Provider Note   CSN: [id number] Arrival date & time: 02/05/19  2050     History   Chief Complaint Chief Complaint  Patient presents with   Medical Clearance    HPI Lindsey Mcguire is a 84 y.o. female.     HPI Patient apparently did not want to come to the emergency room.  According to the EMS reports the patient had been having some difficulty with her breathing so the family called EMS.  Patient refused to come to the hospital so they ended up filling out IVC paperwork.  Patient does admit to having trouble with her breathing.  She has also noticed swelling in her arms and legs.  Patient does feel that she is getting weaker and she is having a hard time walking around.  She denies any fevers or chills.  She denies any headache.  She denies any focal numbness or focal weakness although she does feel generally weak. Past Medical History:  Diagnosis Date   Anemia 10/2003   Diabetes mellitus    Hypertension     Patient Active Problem List   Diagnosis Date Noted   ICH (intracerebral hemorrhage) ([place]) 02/23/2017   Intracranial hemorrhage ([place]) 02/23/2017   Type 2 diabetes mellitus with stage 4 chronic kidney disease, without long-term current use of insulin ([place]) 09/24/2016   Anemia due to chronic renal failure treated with erythropoietin 02/16/2011    Past Surgical History:  Procedure Laterality Date   CATARACT EXTRACTION  12/06/2003   left eye   TUBAL LIGATION  123456   YAG LASER APPLICATION Right [id number]   Procedure: YAG LASER APPLICATION;  Surgeon: Elta Guadeloupe T. Gershon Crane, MD;  Location: [facility];  Service: Ophthalmology;  Laterality: Right;     OB History   No obstetric history on file.      Home Medications    Prior to Admission medications   Medication Sig Start Date End Date Taking? Authorizing Provider  amLODipine (NORVASC) 5 MG tablet Take 5 mg by mouth daily. 03/20/18  Yes [provider]  aspirin 81 MG tablet  Take 81 mg by mouth daily.    Yes [provider]  atorvastatin (LIPITOR) 40 MG tablet Take 40 mg by mouth daily.   Yes [provider]  calcium carbonate (CALCIUM 600 HIGH POTENCY) 600 MG TABS tablet Take 600 mg by mouth 2 (two) times daily with a meal.   Yes [provider]  cholecalciferol (VITAMIN D3) 25 MCG (1000 UT) tablet Take 1,000 Units by mouth daily.   Yes [provider]  ferrous sulfate 325 (65 FE) MG tablet Take 650 mg by mouth daily with breakfast.    Yes [provider]  furosemide (LASIX) 40 MG tablet Take 40 mg by mouth daily. 10/01/18  Yes [provider]  Multiple Vitamin (MULTIVITAMIN) tablet Take 1 tablet by mouth daily.   Yes [provider]  olmesartan-hydrochlorothiazide (BENICAR HCT) 40-12.5 MG tablet Take 1 tablet by mouth daily.   Yes [provider]  pioglitazone (ACTOS) 45 MG tablet Take 45 mg by mouth daily.   Yes [provider]  Polyethyl Glycol-Propyl Glycol (SYSTANE OP) Place 2 drops into both eyes 4 (four) times daily as needed (dryness).    Yes [provider]  Polyethylene Glycol 3350 (MIRALAX PO) Take 17 g by mouth daily as needed (constipation).     [provider]    Family History Family History  Problem Relation Age of Onset   Diabetes  Mother    Hypertension Mother    Cancer Brother    Diabetes Brother    Hypertension Maternal Grandmother    Diabetes Maternal Grandmother    Cancer Brother    Diabetes Brother     Social History Social History   Tobacco Use   Smoking status: Never Smoker   Smokeless tobacco: Never Used  Substance Use Topics   Alcohol use: No   Drug use: No     Allergies   Patient has no known allergies.   Review of Systems Review of Systems  All other systems reviewed and are negative.    Physical Exam Updated Vital Signs BP (!) 142/80    Pulse 76    Temp 97.8 F (36.6 C) (Oral)    Resp 15    SpO2 100%    Physical Exam Vitals signs and nursing note reviewed.  Constitutional:      General: She is not in acute distress.    Appearance: She is well-developed.  HENT:     Head: Normocephalic and atraumatic.     Right Ear: External ear normal.     Left Ear: External ear normal.  Eyes:     General: No scleral icterus.       Right eye: No discharge.        Left eye: No discharge.     Conjunctiva/sclera: Conjunctivae normal.  Neck:     Musculoskeletal: Neck supple.     Trachea: No tracheal deviation.  Cardiovascular:     Rate and Rhythm: Normal rate and regular rhythm.  Pulmonary:     Effort: Pulmonary effort is normal. No respiratory distress.     Breath sounds: Normal breath sounds. No stridor. No wheezing or rales.     Comments: Patient is requiring supplemental oxygen Abdominal:     General: Bowel sounds are normal. There is no distension.     Palpations: Abdomen is soft.     Tenderness: There is no abdominal tenderness. There is no guarding or rebound.  Musculoskeletal:        General: Swelling present. No tenderness.     Right lower leg: Edema present.     Left lower leg: Edema present.     Comments: Patient has edema of all 4 extremities, notable edema in her hands and upper extremities, she also has tense edema in her lower extremities  Skin:    General: Skin is warm and dry.     Findings: No rash.  Neurological:     Mental Status: She is alert.     Cranial Nerves: No cranial nerve deficit (no facial droop, extraocular movements intact, no slurred speech).     Sensory: No sensory deficit.     Motor: Weakness present. No abnormal muscle tone or seizure activity.     Coordination: Coordination normal.     Comments: Generalized weakness although patient is able to move all 4 extremities      ED Treatments / Results  Labs (all labs ordered are listed, but only abnormal results are displayed) Labs Reviewed  CBC WITH DIFFERENTIAL/PLATELET - Abnormal; Notable for the  following components:      Result Value   Hemoglobin 11.2 (*)    RDW 19.5 (*)    Platelets 118 (*)    Lymphs Abs 0.3 (*)    All other components within normal limits  COMPREHENSIVE METABOLIC PANEL - Abnormal; Notable for the following components:   Potassium 3.3 (*)    Glucose, Bld 167 (*)  Creatinine, Ser 1.53 (*)    Albumin 3.1 (*)    Alkaline Phosphatase 168 (*)    GFR calc non Af Amer 31 (*)    GFR calc Af Amer 36 (*)    All other components within normal limits  BRAIN NATRIURETIC PEPTIDE - Abnormal; Notable for the following components:   B Natriuretic Peptide 535.0 (*)    All other components within normal limits  D-DIMER, QUANTITATIVE (NOT AT [facility]) - Abnormal; Notable for the following components:   D-Dimer, Quant 2.74 (*)    All other components within normal limits  TROPONIN I (HIGH SENSITIVITY) - Abnormal; Notable for the following components:   Troponin I (High Sensitivity) 29 (*)    All other components within normal limits  RAPID URINE DRUG SCREEN, HOSP PERFORMED  ETHANOL  URINALYSIS, ROUTINE W REFLEX MICROSCOPIC  BLOOD GAS, VENOUS    EKG EKG Interpretation  Date/Time:  Friday February 05 2019 22:18:51 EDT Ventricular Rate:  86 PR Interval:    QRS Duration: 58 QT Interval:  418 QTC Calculation: 500 R Axis:   0 Text Interpretation:  sinus rhythm Indeterminate axis Baseline wander in lead(s) I III aVL aVF Right bundle branch block LOW VOLTAGE low voltage is new since last tracing Confirmed by Dorie Rank [phone]) on 02/05/2019 10:34:08 PM   Radiology Dg Chest Portable 1 View  Result Date: 02/05/2019 CLINICAL DATA:  Shortness of breath EXAM: PORTABLE CHEST 1 VIEW COMPARISON:  None. FINDINGS: The lung volumes are very low. The heart size is significantly enlarged. There are likely moderate sized bilateral pleural effusions with adjacent atelectasis. There are prominent interstitial lung markings bilaterally. There is no pneumothorax. No evidence for an acute  osseous abnormality. IMPRESSION: 1. Very low lung volumes. 2. Cardiomegaly. 3. Overall findings concerning for congestive heart failure with small to moderate-sized bilateral pleural effusions and adjacent compressive atelectasis. Electronically Signed   By: Constance Holster M.D.   On: 02/05/2019 22:33    Procedures Procedures (including critical care time)  Medications Ordered in ED Medications - No data to display   Initial Impression / Assessment and Plan / ED Course  I have reviewed the triage vital signs and the nursing notes.  Pertinent labs & imaging results that were available during my care of the patient were reviewed by me and considered in my medical decision making (see chart for details).   Patient presented to the ED for evaluation of worsening peripheral edema and weakness.  Patient also is complaining of shortness of breath.  Patient's laboratory tests are consistent with chronic kidney disease but this is not significantly changed from baseline.  No evidence of leukocytosis or anemia.  Patient does have significant elevation in d-dimer BNP and troponin.  She also has low voltage on her EKG which may be related to her anasarca but I am also concerned about the possibility of a pericardial effusion.  Patient will require admission the hospital for further evaluation.  She will likely need an echocardiogram.  With her renal insufficiency it may be more appropriate to do a VQ scan.  CT head is pending.  Care will be turned over to Dr Rolland Porter to follow up on result and arrange admission  Final Clinical Impressions(s) / ED Diagnoses   Final diagnoses:  Congestive heart failure, unspecified HF chronicity, unspecified heart failure type ([place])  Chronic kidney disease, unspecified CKD stage    ED Discharge Orders    None       Dorie Rank, MD 02/05/19  2339 ° °

## 2019-02-06 ENCOUNTER — Observation Stay (HOSPITAL_COMMUNITY): Payer: Medicare Other

## 2019-02-06 ENCOUNTER — Encounter (HOSPITAL_COMMUNITY): Payer: Self-pay | Admitting: Internal Medicine

## 2019-02-06 DIAGNOSIS — I509 Heart failure, unspecified: Secondary | ICD-10-CM

## 2019-02-06 DIAGNOSIS — E1122 Type 2 diabetes mellitus with diabetic chronic kidney disease: Secondary | ICD-10-CM | POA: Diagnosis not present

## 2019-02-06 DIAGNOSIS — N184 Chronic kidney disease, stage 4 (severe): Secondary | ICD-10-CM | POA: Diagnosis not present

## 2019-02-06 DIAGNOSIS — D631 Anemia in chronic kidney disease: Secondary | ICD-10-CM | POA: Diagnosis not present

## 2019-02-06 LAB — COMPREHENSIVE METABOLIC PANEL
ALT: 21 U/L (ref 0–44)
AST: 32 U/L (ref 15–41)
Albumin: 2.8 g/dL — ABNORMAL LOW (ref 3.5–5.0)
Alkaline Phosphatase: 146 U/L — ABNORMAL HIGH (ref 38–126)
Anion gap: 7 (ref 5–15)
BUN: 24 mg/dL — ABNORMAL HIGH (ref 8–23)
CO2: 25 mmol/L (ref 22–32)
Calcium: 8.5 mg/dL — ABNORMAL LOW (ref 8.9–10.3)
Chloride: 109 mmol/L (ref 98–111)
Creatinine, Ser: 1.63 mg/dL — ABNORMAL HIGH (ref 0.44–1.00)
GFR calc Af Amer: 34 mL/min — ABNORMAL LOW (ref >60)
GFR calc non Af Amer: 29 mL/min — ABNORMAL LOW (ref >60)
Glucose, Bld: 140 mg/dL — ABNORMAL HIGH (ref 70–99)
Potassium: 4.9 mmol/L (ref 3.5–5.1)
Sodium: 141 mmol/L (ref 135–145)
Total Bilirubin: 1.8 mg/dL — ABNORMAL HIGH (ref 0.3–1.2)
Total Protein: 7 g/dL (ref 6.5–8.1)

## 2019-02-06 LAB — CBC
HCT: 33.5 % — ABNORMAL LOW (ref 36.0–46.0)
Hemoglobin: 10.6 g/dL — ABNORMAL LOW (ref 12.0–15.0)
MCH: 27.1 pg (ref 26.0–34.0)
MCHC: 31.6 g/dL (ref 30.0–36.0)
MCV: 85.7 fL (ref 80.0–100.0)
Platelets: 115 10*3/uL — ABNORMAL LOW (ref 150–400)
RBC: 3.91 MIL/uL (ref 3.87–5.11)
RDW: 19.7 % — ABNORMAL HIGH (ref 11.5–15.5)
WBC: 4 10*3/uL (ref 4.0–10.5)
nRBC: 0 % (ref 0.0–0.2)

## 2019-02-06 LAB — SARS CORONAVIRUS 2 (TAT 6-24 HRS): SARS Coronavirus 2: NEGATIVE

## 2019-02-06 LAB — GLUCOSE, CAPILLARY
Glucose-Capillary: 137 mg/dL — ABNORMAL HIGH (ref 70–99)
Glucose-Capillary: 148 mg/dL — ABNORMAL HIGH (ref 70–99)
Glucose-Capillary: 129 mg/dL — ABNORMAL HIGH (ref 70–99)
Glucose-Capillary: 113 mg/dL — ABNORMAL HIGH (ref 70–99)

## 2019-02-06 LAB — TROPONIN I (HIGH SENSITIVITY)
Troponin I (High Sensitivity): 28 ng/L — ABNORMAL HIGH (ref <18)
Troponin I (High Sensitivity): 30 ng/L — ABNORMAL HIGH (ref <18)

## 2019-02-06 LAB — TSH: TSH: 14.487 u[IU]/mL — ABNORMAL HIGH (ref 0.350–4.500)

## 2019-02-06 MED ORDER — POLYETHYL GLYCOL-PROPYL GLYCOL 0.4-0.3 % OP GEL: Freq: Four times a day (QID) | OPHTHALMIC | Status: DC | PRN

## 2019-02-06 MED ORDER — ASPIRIN 81 MG PO CHEW
81.0000 mg | CHEWABLE_TABLET | Freq: Every day | ORAL | Status: DC
  Administered 2019-02-06 – 2019-02-12 (×7): 81 mg via ORAL
  Filled 2019-02-06 (×7): qty 1

## 2019-02-06 MED ORDER — FUROSEMIDE 10 MG/ML IJ SOLN
40.0000 mg | Freq: Two times a day (BID) | INTRAMUSCULAR | Status: DC
  Administered 2019-02-06 – 2019-02-10 (×8): 40 mg via INTRAVENOUS
  Filled 2019-02-06 (×8): qty 4

## 2019-02-06 MED ORDER — AMLODIPINE BESYLATE 5 MG PO TABS: 2.5000 mg | ORAL_TABLET | Freq: Every day | ORAL | Status: DC

## 2019-02-06 MED ORDER — ENOXAPARIN SODIUM 40 MG/0.4ML ~~LOC~~ SOLN
40.0000 mg | SUBCUTANEOUS | Status: DC
  Administered 2019-02-06: 13:00:00 40 mg via SUBCUTANEOUS
  Filled 2019-02-06: qty 0.4

## 2019-02-06 MED ORDER — ENOXAPARIN SODIUM 30 MG/0.3ML ~~LOC~~ SOLN
30.0000 mg | SUBCUTANEOUS | Status: DC
  Administered 2019-02-07 – 2019-02-08 (×2): 30 mg via SUBCUTANEOUS
  Filled 2019-02-06 (×3): qty 0.3

## 2019-02-06 MED ORDER — POTASSIUM CHLORIDE CRYS ER 20 MEQ PO TBCR
40.0000 meq | EXTENDED_RELEASE_TABLET | Freq: Once | ORAL | Status: DC
  Filled 2019-02-06: qty 2

## 2019-02-06 MED ORDER — FUROSEMIDE 10 MG/ML IJ SOLN
40.0000 mg | Freq: Every day | INTRAMUSCULAR | Status: DC
  Administered 2019-02-06: 10:00:00 40 mg via INTRAVENOUS
  Filled 2019-02-06: qty 4

## 2019-02-06 MED ORDER — ATORVASTATIN CALCIUM 40 MG PO TABS
40.0000 mg | ORAL_TABLET | Freq: Every day | ORAL | Status: DC
  Administered 2019-02-06 – 2019-02-11 (×5): 40 mg via ORAL
  Filled 2019-02-06 (×6): qty 1

## 2019-02-06 MED ORDER — NITROGLYCERIN 2 % TD OINT
1.0000 [in_us] | TOPICAL_OINTMENT | Freq: Once | TRANSDERMAL | Status: AC
  Administered 2019-02-06: 01:00:00 1 [in_us] via TOPICAL
  Filled 2019-02-06: qty 1

## 2019-02-06 MED ORDER — IRBESARTAN 150 MG PO TABS
300.0000 mg | ORAL_TABLET | Freq: Every day | ORAL | Status: DC
  Administered 2019-02-06 – 2019-02-12 (×7): 300 mg via ORAL
  Filled 2019-02-06 (×8): qty 2

## 2019-02-06 MED ORDER — INSULIN ASPART 100 UNIT/ML ~~LOC~~ SOLN: 0.0000 [IU] | Freq: Every day | SUBCUTANEOUS | Status: DC

## 2019-02-06 MED ORDER — INSULIN ASPART 100 UNIT/ML ~~LOC~~ SOLN
0.0000 [IU] | Freq: Three times a day (TID) | SUBCUTANEOUS | Status: DC
  Administered 2019-02-06 – 2019-02-11 (×6): 1 [IU] via SUBCUTANEOUS

## 2019-02-06 MED ORDER — FUROSEMIDE 10 MG/ML IJ SOLN: 40.0000 mg | Freq: Every day | INTRAMUSCULAR | Status: DC

## 2019-02-06 MED ORDER — FUROSEMIDE 10 MG/ML IJ SOLN
80.0000 mg | Freq: Once | INTRAMUSCULAR | Status: AC
  Administered 2019-02-06: 01:00:00 80 mg via INTRAVENOUS
  Filled 2019-02-06: qty 8

## 2019-02-06 MED ORDER — POLYETHYLENE GLYCOL 3350 17 G PO PACK: 17.0000 g | PACK | Freq: Every day | ORAL | Status: DC | PRN

## 2019-02-06 NOTE — Progress Notes (Signed)
Per HPI:  Lundin Kopelman  is a 84 y.o. female, w hypertension, Dm2, Anemia, presents due to edema.  Pt is sleepy and unwilling to provide history.   Patient was admitted with symptoms of dyspnea and edema likely secondary to acute on chronic diastolic CHF.  Prior 2D echocardiogram on 10/2018 with LVEF 60-65% noted.  Further history obtained from son and his fiance regarding her unwillingness to come to the hospital over the last 2 months despite worsening shortness of breath and volume overload.  Over the last 2 weeks it appears that she has been more altered as well and sleepy at home.  So there also appears to be a component of encephalopathy present that needs further evaluation.  Toxicology with no findings at this moment and urine analysis does not demonstrate any signs of UTI.  We will obtain ammonia levels as well as TSH to further evaluate.  ABG does not demonstrate any significant hypercapnia.  CT of the head with no acute findings.  Currently, patient has been started on IV Lasix for diuresis which I will increase to twice daily dosing and monitor closely.  She appears to have good oxygen saturation, but d-dimer was elevated and VQ scan is pending due to poor kidney function.  Patient did not have IVC during this admission.  We will continue to follow labs.  She more than likely will require placement on discharge once ready to be evaluated by PT.  Total care time: 35 minutes.

## 2019-02-06 NOTE — ED Provider Notes (Signed)
Patient was left at change of shift to arrange admission after her head CT return.  Patient evidently was having shortness of breath and EMS was called and patient refused to be transported to the hospital.  Her family filled out IVC papers.  Dr. Dorie Rank has rescinded those papers.  I reviewed her ED course and she had not gotten any medications for her anasarca, she was given nitroglycerin paste and Lasix 80 mg IV due to her renal insufficiency.  Because I was given her Lasix she did have some mild hypokalemia, I considered the fact that she had renal insufficiency but did give her 1 dose of oral potassium.  Recheck at 2:00  AM patient states her breathing is better, she is on nasal cannula oxygen.  We discussed that her CT scan did not show anything acute and she is agreeable to be admitted.  Patient has elevated d-dimer however she has some renal insufficiency so CT angios was not done.  VQ scan will be available today per radiology staff.  Patient has a history of intracranial bleed so she was not started on a anticoagulant.  2:11 AM Dr. Maudie Mercury, hospitalist will admit.   Ct Head Wo Contrast  Result Date: 02/06/2019 CLINICAL DATA:  Encephalopathy EXAM: CT HEAD WITHOUT CONTRAST TECHNIQUE: Contiguous axial images were obtained from the base of the skull through the vertex without intravenous contrast. COMPARISON:  Head CT 02/23/2017 FINDINGS: Brain: There is no mass, hemorrhage or extra-axial collection. There is generalized atrophy without lobar predilection. Hypodensity of the white matter is most commonly associated with chronic microvascular disease. Vascular: No abnormal hyperdensity of the major intracranial arteries or dural venous sinuses. No intracranial atherosclerosis. Skull: The visualized skull base, calvarium and extracranial soft tissues are normal. Sinuses/Orbits: No fluid levels or advanced mucosal thickening of the visualized paranasal sinuses. No mastoid or middle ear effusion. The  orbits are normal. IMPRESSION: Generalized atrophy and chronic microvascular ischemia without acute intracranial abnormality. Electronically Signed   By: Ulyses Jarred M.D.   On: 02/06/2019 00:57   Dg Chest Portable 1 View  Result Date: 02/05/2019 CLINICAL DATA:  Shortness of breath EXAM: PORTABLE CHEST 1 VIEW COMPARISON:  None. FINDINGS: The lung volumes are very low. The heart size is significantly enlarged. There are likely moderate sized bilateral pleural effusions with adjacent atelectasis. There are prominent interstitial lung markings bilaterally. There is no pneumothorax. No evidence for an acute osseous abnormality. IMPRESSION: 1. Very low lung volumes. 2. Cardiomegaly. 3. Overall findings concerning for congestive heart failure with small to moderate-sized bilateral pleural effusions and adjacent compressive atelectasis. Electronically Signed   By: Constance Holster M.D.   On: 02/05/2019 22:33  Diagnoses that have been ruled out:  None  Diagnoses that are still under consideration:  None  Final diagnoses:  Congestive heart failure, unspecified HF chronicity, unspecified heart failure type ([place])  Chronic kidney disease, unspecified CKD stage   Plan admission  Rolland Porter, MD, Barbette Or, MD 02/06/19 [phone]

## 2019-02-06 NOTE — H&P (Addendum)
TRH H&P    Patient Demographics:    Lindsey Mcguire, is a 84 y.o. female  MRN: [id number]  DOB - [birth year]  Admit Date - 02/05/2019  Referring MD/NP/PA:   Rolland Porter  Outpatient Primary MD for the patient is Leeroy Cha, MD  Patient coming from:  home  Chief complaint-  edema   HPI:    Lindsey Mcguire  is a 84 y.o. female, w hypertension, Dm2, Anemia, presents due to edema.  Pt is sleepy and unwilling to provide history.   In ED,  T 97.8 P 77 R 22, Bp 147/86  Pox 94% on RA  CXR IMPRESSION: 1. Very low lung volumes. 2. Cardiomegaly. 3. Overall findings concerning for congestive heart failure with small to moderate-sized bilateral pleural effusions and adjacent compressive atelectasis.  CT brain IMPRESSION: Generalized atrophy and chronic microvascular ischemia without acute intracranial abnormality.  Wbc 4.4, Hgb 11.2, Plt 118 Na 141, K 3.3,  Bun 23, Creatinine 1.53 Ast 26, Alt 18, Alk phos 168, T. Bili 1.2  PH 7.338, pCo2 51.3  D dimer 2.74 Trop I  29 -> 30  EKG nsr 85  Pt will be admitted for dyspnea and edema, secondary to CHF.     Review of systems:    In addition to the HPI above,  No Fever-chills, No Headache, No changes with Vision or hearing, No problems swallowing food or Liquids, No Chest pain, Cough   No Abdominal pain, No Nausea or Vomiting, bowel movements are regular, No Blood in stool or Urine, No dysuria, No new skin rashes or bruises, No new joints pains-aches,  No new weakness, tingling, numbness in any extremity, No recent weight gain or loss, No polyuria, polydypsia or polyphagia, No significant Mental Stressors.  All other systems reviewed and are negative.    Past History of the following :    Past Medical History:  Diagnosis Date   Anemia 10/2003   Diabetes mellitus    Hypertension       Past Surgical History:  Procedure  Laterality Date   CATARACT EXTRACTION  12/06/2003   left eye   TUBAL LIGATION  4068   YAG LASER APPLICATION Right 07/31/3531   Procedure: YAG LASER APPLICATION;  Surgeon: Elta Guadeloupe T. Gershon Crane, MD;  Location: [facility];  Service: Ophthalmology;  Laterality: Right;      Social History:      Social History   Tobacco Use   Smoking status: Never Smoker   Smokeless tobacco: Never Used  Substance Use Topics   Alcohol use: No       Family History :     Family History  Problem Relation Age of Onset   Diabetes Mother    Hypertension Mother    Cancer Brother    Diabetes Brother    Hypertension Maternal Grandmother    Diabetes Maternal Grandmother    Cancer Brother    Diabetes Brother        Home Medications:   Prior to Admission medications   Medication Sig Start Date End Date Taking? Authorizing Provider  amLODipine (NORVASC) 5 MG tablet Take 5 mg by mouth daily. 03/20/18  Yes [provider]  aspirin 81 MG tablet Take 81 mg by mouth daily.    Yes [provider]  atorvastatin (LIPITOR) 40 MG tablet Take 40 mg by mouth daily.   Yes [provider]  calcium carbonate (CALCIUM 600 HIGH POTENCY) 600 MG TABS tablet Take 600 mg by mouth 2 (two) times daily with a meal.   Yes [provider]  cholecalciferol (VITAMIN D3) 25 MCG (1000 UT) tablet Take 1,000 Units by mouth daily.   Yes [provider]  ferrous sulfate 325 (65 FE) MG tablet Take 650 mg by mouth daily with breakfast.    Yes [provider]  furosemide (LASIX) 40 MG tablet Take 40 mg by mouth daily. 10/01/18  Yes [provider]  Multiple Vitamin (MULTIVITAMIN) tablet Take 1 tablet by mouth daily.   Yes [provider]  olmesartan-hydrochlorothiazide (BENICAR HCT) 40-12.5 MG tablet Take 1 tablet by mouth daily.   Yes [provider]  pioglitazone (ACTOS) 45 MG tablet Take 45 mg by mouth daily.   Yes [provider]  Polyethyl  Glycol-Propyl Glycol (SYSTANE OP) Place 2 drops into both eyes 4 (four) times daily as needed (dryness).    Yes [provider]  Polyethylene Glycol 3350 (MIRALAX PO) Take 17 g by mouth daily as needed (constipation).     [provider]     Allergies:    No Known Allergies   Physical Exam:   Vitals  Blood pressure 124/88, pulse 77, temperature 97.8 F (36.6 C), temperature source Oral, resp. rate 15, SpO2 100 %.  1.  General: Pt doesn't want to answer questions,   2. Psychiatric: Euthymic, slightly irritable.   3. Neurologic: cn2-12 intact, reflexes 2+ symmetric, direct, consensual, near intact   4. HEENMT:  Anicteric, pupils 1.55m symmetric, direct, consensual, near intact NEck: slight jvd  5. Respiratory : Decrease bs at bilateral base, no wheezing,   6. Cardiovascular : rrr s1, s2,   7. Gastrointestinal:  Abd: soft, morbidly obese, nt, nd,+bs  8. Skin:  Ext: no c/c/e,  No rash  9.Musculoskeletal:  Good ROM    Data Review:    CBC Recent Labs  Lab 02/01/19 1105 02/05/19 2154  WBC 4.0 4.4  HGB 10.2* 11.2*  HCT 31.0* 36.0  PLT 109* 118*  MCV 82.4 86.1  MCH 27.1 26.8  MCHC 32.9 31.1  RDW 17.8* 19.5*  LYMPHSABS 0.3* 0.3*  MONOABS 0.4 0.5  EOSABS 0.0 0.0  BASOSABS 0.0 0.0   ------------------------------------------------------------------------------------------------------------------  Results for orders placed or performed during the hospital encounter of 02/05/19 (from the past 48 hour(s))  CBC with Differential     Status: Abnormal   Collection Time: 02/05/19  9:54 PM  Result Value Ref Range   WBC 4.4 4.0 - 10.5 K/uL   RBC 4.18 3.87 - 5.11 MIL/uL   Hemoglobin 11.2 (L) 12.0 - 15.0 g/dL   HCT 36.0 36.0 - 46.0 %   MCV 86.1 80.0 - 100.0 fL   MCH 26.8 26.0 - 34.0 pg   MCHC 31.1 30.0 - 36.0 g/dL   RDW 19.5 (H) 11.5 - 15.5 %   Platelets 118 (L) 150 - 400 K/uL    Comment: SPECIMEN CHECKED FOR CLOTS   nRBC 0.0 0.0 - 0.2 %    Neutrophils Relative % 78 %   Neutro Abs 3.5 1.7 - 7.7 K/uL   Lymphocytes Relative 7 %  Lymphs Abs 0.3 (L) 0.7 - 4.0 K/uL   Monocytes Relative 12 %   Monocytes Absolute 0.5 0.1 - 1.0 K/uL   Eosinophils Relative 1 %   Eosinophils Absolute 0.0 0.0 - 0.5 K/uL   Basophils Relative 1 %   Basophils Absolute 0.0 0.0 - 0.1 K/uL   Immature Granulocytes 1 %   Abs Immature Granulocytes 0.02 0.00 - 0.07 K/uL    Comment: Performed at [facility], [address], [place], [place] [address]  Comprehensive metabolic panel     Status: Abnormal   Collection Time: 02/05/19  9:54 PM  Result Value Ref Range   Sodium 141 135 - 145 mmol/L   Potassium 3.3 (L) 3.5 - 5.1 mmol/L   Chloride 106 98 - 111 mmol/L   CO2 23 22 - 32 mmol/L   Glucose, Bld 167 (H) 70 - 99 mg/dL   BUN 23 8 - 23 mg/dL   Creatinine, Ser 1.53 (H) 0.44 - 1.00 mg/dL   Calcium 8.9 8.9 - 10.3 mg/dL   Total Protein 7.6 6.5 - 8.1 g/dL   Albumin 3.1 (L) 3.5 - 5.0 g/dL   AST 26 15 - 41 U/L   ALT 18 0 - 44 U/L   Alkaline Phosphatase 168 (H) 38 - 126 U/L   Total Bilirubin 1.2 0.3 - 1.2 mg/dL   GFR calc non Af Amer 31 (L) >60 mL/min   GFR calc Af Amer 36 (L) >60 mL/min   Anion gap 12 5 - 15    Comment: Performed at [facility], [address] [place], [place] [address]  Brain natriuretic peptide     Status: Abnormal   Collection Time: 02/05/19  9:54 PM  Result Value Ref Range   B Natriuretic Peptide 535.0 (H) 0.0 - 100.0 pg/mL    Comment: Performed at [facility] [address] [place], [place] [address]  Troponin I (High Sensitivity)     Status: Abnormal   Collection Time: 02/05/19  9:54 PM  Result Value Ref Range   Troponin I (High Sensitivity) 29 (H) <18 ng/L    Comment: (NOTE) Elevated high sensitivity troponin I (hsTnI) values and significant  changes across serial measurements may suggest ACS but many other  chronic and acute conditions are known to elevate hsTnI results.  Refer to the "Links" section for chest pain algorithms  and additional  guidance. Performed at [facility], [address], [place], [place] [address]   D-dimer, quantitative (not at [facility])     Status: Abnormal   Collection Time: 02/05/19  9:54 PM  Result Value Ref Range   D-Dimer, Quant 2.74 (H) 0.00 - 0.50 ug/mL-FEU    Comment: (NOTE) At the manufacturer cut-off of 0.50 ug/mL FEU, this assay has been documented to exclude PE with a sensitivity and negative predictive value of 97 to 99%.  At this time, this assay has not been approved by the FDA to exclude DVT/VTE. Results should be correlated with clinical presentation. Performed at [facility], [address], [place], [place] [address]   Ethanol     Status: None   Collection Time: 02/05/19  9:55 PM  Result Value Ref Range   Alcohol, Ethyl (B) <10 <10 mg/dL    Comment: (NOTE) Lowest detectable limit for serum alcohol is 10 mg/dL. For medical purposes only. Performed at [facility], [address], [place],  [address]   Urinalysis, Routine w reflex microscopic     Status: Abnormal   Collection Time: 02/05/19 10:00 PM  Result Value Ref Range   Color, Urine YELLOW YELLOW   APPearance CLEAR CLEAR   Specific Gravity, Urine 1.025 1.005 - 1.030   pH 5.5 5.0 - 8.0   Glucose, UA NEGATIVE NEGATIVE mg/dL   Hgb urine dipstick TRACE (A) NEGATIVE   Bilirubin Urine SMALL (A) NEGATIVE   Ketones, ur NEGATIVE NEGATIVE mg/dL   Protein, ur 30 (A) NEGATIVE mg/dL   Nitrite NEGATIVE NEGATIVE   Leukocytes,Ua TRACE (A) NEGATIVE    Comment: Performed at [facility], [address] [place], [place] [address]  Urinalysis, Microscopic (reflex)     Status: Abnormal   Collection Time: 02/05/19 10:00 PM  Result Value Ref Range   RBC / HPF 0-5 0 - 5 RBC/hpf   WBC, UA 6-10 0 - 5 WBC/hpf   Bacteria, UA MANY (A) NONE SEEN   Squamous Epithelial / LPF 0-5 0 - 5    Comment: Performed at [facility], [address], [place], [place] [address]  Rapid urine drug screen (hospital performed)     Status:  None   Collection Time: 02/05/19 10:30 PM  Result Value Ref Range   Opiates NONE DETECTED NONE DETECTED   Cocaine NONE DETECTED NONE DETECTED   Benzodiazepines NONE DETECTED NONE DETECTED   Amphetamines NONE DETECTED NONE DETECTED   Tetrahydrocannabinol NONE DETECTED NONE DETECTED   Barbiturates NONE DETECTED NONE DETECTED    Comment: (NOTE) DRUG SCREEN FOR MEDICAL PURPOSES ONLY.  IF CONFIRMATION IS NEEDED FOR ANY PURPOSE, NOTIFY LAB WITHIN 5 DAYS. LOWEST DETECTABLE LIMITS FOR URINE DRUG SCREEN Drug Class                     Cutoff (ng/mL) Amphetamine and metabolites    1000 Barbiturate and metabolites    200 Benzodiazepine                 818 Tricyclics and metabolites     300 Opiates and metabolites        300 Cocaine and metabolites        300 THC                            50 Performed at [facility], [address] [place], [place] [address]   Blood gas, venous (at [facility] and [facility], not at [facility])     Status: None   Collection Time: 02/05/19 11:45 PM  Result Value Ref Range   FIO2 28.00    pH, Ven 7.338 7.250 - 7.430   pCO2, Ven 51.3 44.0 - 60.0 mmHg   pO2, Ven 43.8 32.0 - 45.0 mmHg   Bicarbonate 24.7 20.0 - 28.0 mmol/L   Acid-Base Excess 1.6 0.0 - 2.0 mmol/L   O2 Saturation 69.5 %   Patient temperature 37.0     Comment: Performed at [facility], [address], [place], [place] [address]  Troponin I (High Sensitivity)     Status: Abnormal   Collection Time: 02/05/19 11:45 PM  Result Value Ref Range   Troponin I (High Sensitivity) 30 (H) <18 ng/L    Comment: (NOTE) Elevated high sensitivity troponin I (hsTnI) values and significant  changes across serial measurements may suggest ACS but many other  chronic and acute conditions are known to elevate hsTnI results.  Refer to the "Links" section for chest pain algorithms and additional  guidance. Performed at [facility], [address], [place], [place] [address]     Chemistries  Recent Labs  Lab 02/01/19 1105  02/05/19 2154  NA 142 141  K 3.3* 3.3*  CL 108 106  CO2 25 23  GLUCOSE 143* 167*  BUN 28* 23  CREATININE 1.98* 1.53*  CALCIUM 8.6* 8.9  AST 21 26  ALT 15 18  ALKPHOS 172* 168*  BILITOT 0.8 1.2   ------------------------------------------------------------------------------------------------------------------  ------------------------------------------------------------------------------------------------------------------ GFR: Estimated Creatinine Clearance: 28.5 mL/min (A) (by C-G formula based on SCr of 1.53 mg/dL (H)). Liver Function Tests: Recent Labs  Lab 02/01/19 1105 02/05/19 2154  AST 21 26  ALT 15 18  ALKPHOS 172* 168*  BILITOT 0.8 1.2  PROT 7.2 7.6  ALBUMIN 2.9* 3.1*   No results for input(s): LIPASE, AMYLASE in the last 168 hours. No results for input(s): AMMONIA in the last 168 hours. Coagulation Profile: No results for input(s): INR, PROTIME in the last 168 hours. Cardiac Enzymes: No results for input(s): CKTOTAL, CKMB, CKMBINDEX, TROPONINI in the last 168 hours. BNP (last 3 results) No results for input(s): PROBNP in the last 8760 hours. HbA1C: No results for input(s): HGBA1C in the last 72 hours. CBG: No results for input(s): GLUCAP in the last 168 hours. Lipid Profile: No results for input(s): CHOL, HDL, LDLCALC, TRIG, CHOLHDL, LDLDIRECT in the last 72 hours. Thyroid Function Tests: No results for input(s): TSH, T4TOTAL, FREET4, T3FREE, THYROIDAB in the last 72 hours. Anemia Panel: No results for input(s): VITAMINB12, FOLATE, FERRITIN, TIBC, IRON, RETICCTPCT in the last 72 hours.  --------------------------------------------------------------------------------------------------------------- Urine analysis:    Component Value Date/Time   COLORURINE YELLOW 02/05/2019 2200   APPEARANCEUR CLEAR 02/05/2019 2200   LABSPEC 1.025 02/05/2019 2200   PHURINE 5.5 02/05/2019 2200   GLUCOSEU NEGATIVE 02/05/2019 2200   HGBUR TRACE (A) 02/05/2019 2200    BILIRUBINUR SMALL (A) 02/05/2019 2200   KETONESUR NEGATIVE 02/05/2019 2200   PROTEINUR 30 (A) 02/05/2019 2200   UROBILINOGEN 0.2 05/15/2010 1725   NITRITE NEGATIVE 02/05/2019 2200   LEUKOCYTESUR TRACE (A) 02/05/2019 2200      Imaging Results:    Ct Head Wo Contrast  Result Date: 02/06/2019 CLINICAL DATA:  Encephalopathy EXAM: CT HEAD WITHOUT CONTRAST TECHNIQUE: Contiguous axial images were obtained from the base of the skull through the vertex without intravenous contrast. COMPARISON:  Head CT 02/23/2017 FINDINGS: Brain: There is no mass, hemorrhage or extra-axial collection. There is generalized atrophy without lobar predilection. Hypodensity of the white matter is most commonly associated with chronic microvascular disease. Vascular: No abnormal hyperdensity of the major intracranial arteries or dural venous sinuses. No intracranial atherosclerosis. Skull: The visualized skull base, calvarium and extracranial soft tissues are normal. Sinuses/Orbits: No fluid levels or advanced mucosal thickening of the visualized paranasal sinuses. No mastoid or middle ear effusion. The orbits are normal. IMPRESSION: Generalized atrophy and chronic microvascular ischemia without acute intracranial abnormality. Electronically Signed   By: Ulyses Jarred M.D.   On: 02/06/2019 00:57   Dg Chest Portable 1 View  Result Date: 02/05/2019 CLINICAL DATA:  Shortness of breath EXAM: PORTABLE CHEST 1 VIEW COMPARISON:  None. FINDINGS: The lung volumes are very low. The heart size is significantly enlarged. There are likely moderate sized bilateral pleural effusions with adjacent atelectasis. There are prominent interstitial lung markings bilaterally. There is no pneumothorax. No evidence for an acute osseous abnormality. IMPRESSION: 1. Very low lung volumes. 2. Cardiomegaly. 3. Overall findings concerning for congestive heart failure with small to moderate-sized bilateral pleural effusions and adjacent compressive atelectasis.  Electronically Signed   By: Constance Holster M.D.   On: 02/05/2019  22:33      Assessment & Plan:    Principal Problem:   CHF (congestive heart failure) (HCC) Active Problems:   Anemia due to chronic renal failure treated with erythropoietin   Type 2 diabetes mellitus with stage 4 chronic kidney disease, without long-term current use of insulin (HCC)  Dyspnea secondary to Acute CHF Tele Trop I  Check tsh Check cardiac echo Lasix 47m iv x1 in ED Lasix 411miv qday Check cmp in am  Pleural effusions Check CT chest  Elevated D dimer Check VQ scan  Hypertension Olmesartan-hydrochlorothiazide-> Olmesartan  Decrease amlodipine to 2.25m425mo qday due to most common side affect edema.   Dm2 STOP Actos due to Edema (very commonly associated w this medication) and CHF Fsbs ac and qhs, ISS Consider FarWilder Glade this has very good data for reducing progression of CKD as well as improved survival w CHF  CKD stage3 Check cmp in am  Hypokalemia Replete Check cmp in am  Anemia/ Thrombocytopenia Check cbc in am   DVT Prophylaxis-    SCDs  AM Labs Ordered, also please review Full Orders  Family Communication: Admission, patients condition and plan of care including tests being ordered have been discussed with the patient  who indicate understanding and agree with the plan and Code Status.  Code Status:  FULL CODE , unable to obtain code status from patient , attempted to contact husband, [facility]: Observation: Based on patients clinical presentation and evaluation of above clinical data, I have made determination that patient meets observation criteria at this time.     Time spent in minutes : 70    JamJani GravelD on 02/06/2019 at 3:12 AM

## 2019-02-06 NOTE — ED Notes (Signed)
ED TO INPATIENT HANDOFF REPORT  ED Nurse Name and Phone #: [phone]  S Name/Age/Gender Lindsey Mcguire 84 y.o. female Room/Bed: APA14/APA14  Code Status   Code Status: Prior  Home/SNF/Other Home Patient oriented to: self Is this baseline? No   Triage Complete: Triage complete  Chief Complaint IVC   Triage Note Ems was called out earlier for sob and pt refused to be evaluated so family took out ivc paperwork. Per family pt is not compliant with home meds and has been having visual and auditory hallucinations. When ems arrived the 2nd time with police pt's o2 was [id number] on room air, but pt has not complaints at this time.   Pt from home via [facility] after family members took IVC papers out due to pt being non compliant with her meds at home and has increased swelling to all extremeties.  Pt states she does not know why her family sent her here. Pt denies complaints   Allergies No Known Allergies  Level of Care/Admitting Diagnosis ED Disposition    None      B Medical/Surgery History Past Medical History:  Diagnosis Date  . Anemia 10/2003  . Diabetes mellitus   . Hypertension    Past Surgical History:  Procedure Laterality Date  . CATARACT EXTRACTION  12/06/2003   left eye  . TUBAL LIGATION  1977  . YAG LASER APPLICATION Right [id number]   Procedure: YAG LASER APPLICATION;  Surgeon: Elta Guadeloupe T. Gershon Crane, MD;  Location: [facility];  Service: Ophthalmology;  Laterality: Right;     A IV Location/Drains/Wounds Patient Lines/Drains/Airways Status   Active Line/Drains/Airways    Name:   Placement date:   Placement time:   Site:   Days:   Peripheral IV 02/05/19 Right Antecubital   02/05/19    2331    Antecubital   1          Intake/Output Last 24 hours No intake or output data in the 24 hours ending 02/06/19 0137  Labs/Imaging Results for orders placed or performed during the hospital encounter of 02/05/19 (from the past 48 hour(s))  CBC with Differential     Status:  Abnormal   Collection Time: 02/05/19  9:54 PM  Result Value Ref Range   WBC 4.4 4.0 - 10.5 K/uL   RBC 4.18 3.87 - 5.11 MIL/uL   Hemoglobin 11.2 (L) 12.0 - 15.0 g/dL   HCT 36.0 36.0 - 46.0 %   MCV 86.1 80.0 - 100.0 fL   MCH 26.8 26.0 - 34.0 pg   MCHC 31.1 30.0 - 36.0 g/dL   RDW 19.5 (H) 11.5 - 15.5 %   Platelets 118 (L) 150 - 400 K/uL    Comment: SPECIMEN CHECKED FOR CLOTS   nRBC 0.0 0.0 - 0.2 %   Neutrophils Relative % 78 %   Neutro Abs 3.5 1.7 - 7.7 K/uL   Lymphocytes Relative 7 %   Lymphs Abs 0.3 (L) 0.7 - 4.0 K/uL   Monocytes Relative 12 %   Monocytes Absolute 0.5 0.1 - 1.0 K/uL   Eosinophils Relative 1 %   Eosinophils Absolute 0.0 0.0 - 0.5 K/uL   Basophils Relative 1 %   Basophils Absolute 0.0 0.0 - 0.1 K/uL   Immature Granulocytes 1 %   Abs Immature Granulocytes 0.02 0.00 - 0.07 K/uL    Comment: Performed at [facility], [address] [place], [place] [address]  Comprehensive metabolic panel     Status: Abnormal   Collection Time: 02/05/19  9:54 PM  Result Value Ref Range   Sodium 141 135 - 145 mmol/L   Potassium 3.3 (L) 3.5 - 5.1 mmol/L   Chloride 106 98 - 111 mmol/L   CO2 23 22 - 32 mmol/L   Glucose, Bld 167 (H) 70 - 99 mg/dL   BUN 23 8 - 23 mg/dL   Creatinine, Ser 1.53 (H) 0.44 - 1.00 mg/dL   Calcium 8.9 8.9 - 10.3 mg/dL   Total Protein 7.6 6.5 - 8.1 g/dL   Albumin 3.1 (L) 3.5 - 5.0 g/dL   AST 26 15 - 41 U/L   ALT 18 0 - 44 U/L   Alkaline Phosphatase 168 (H) 38 - 126 U/L   Total Bilirubin 1.2 0.3 - 1.2 mg/dL   GFR calc non Af Amer 31 (L) >60 mL/min   GFR calc Af Amer 36 (L) >60 mL/min   Anion gap 12 5 - 15    Comment: Performed at [facility], [address] [place], [place] [address]  Brain natriuretic peptide     Status: Abnormal   Collection Time: 02/05/19  9:54 PM  Result Value Ref Range   B Natriuretic Peptide 535.0 (H) 0.0 - 100.0 pg/mL    Comment: Performed at [facility], [address], [place], [place] [address]  Troponin I (High Sensitivity)      Status: Abnormal   Collection Time: 02/05/19  9:54 PM  Result Value Ref Range   Troponin I (High Sensitivity) 29 (H) <18 ng/L    Comment: (NOTE) Elevated high sensitivity troponin I (hsTnI) values and significant  changes across serial measurements may suggest ACS but many other  chronic and acute conditions are known to elevate hsTnI results.  Refer to the "Links" section for chest pain algorithms and additional  guidance. Performed at [facility], [address], [place], [place] [address]   D-dimer, quantitative (not at [facility])     Status: Abnormal   Collection Time: 02/05/19  9:54 PM  Result Value Ref Range   D-Dimer, Quant 2.74 (H) 0.00 - 0.50 ug/mL-FEU    Comment: (NOTE) At the manufacturer cut-off of 0.50 ug/mL FEU, this assay has been documented to exclude PE with a sensitivity and negative predictive value of 97 to 99%.  At this time, this assay has not been approved by the FDA to exclude DVT/VTE. Results should be correlated with clinical presentation. Performed at [facility], [address], [place], [place] [address]   Ethanol     Status: None   Collection Time: 02/05/19  9:55 PM  Result Value Ref Range   Alcohol, Ethyl (B) <10 <10 mg/dL    Comment: (NOTE) Lowest detectable limit for serum alcohol is 10 mg/dL. For medical purposes only. Performed at [facility], [address] [place], [place] [address]   Urinalysis, Routine w reflex microscopic     Status: Abnormal   Collection Time: 02/05/19 10:00 PM  Result Value Ref Range   Color, Urine YELLOW YELLOW   APPearance CLEAR CLEAR   Specific Gravity, Urine 1.025 1.005 - 1.030   pH 5.5 5.0 - 8.0   Glucose, UA NEGATIVE NEGATIVE mg/dL   Hgb urine dipstick TRACE (A) NEGATIVE   Bilirubin Urine SMALL (A) NEGATIVE   Ketones, ur NEGATIVE NEGATIVE mg/dL   Protein, ur 30 (A) NEGATIVE mg/dL   Nitrite NEGATIVE NEGATIVE   Leukocytes,Ua TRACE (A) NEGATIVE    Comment: Performed at [facility], [address]  [place], [place] [address]  Urinalysis, Microscopic (reflex)  Status: Abnormal   Collection Time: 02/05/19 10:00 PM  Result Value Ref Range   RBC / HPF 0-5 0 - 5 RBC/hpf   WBC, UA 6-10 0 - 5 WBC/hpf   Bacteria, UA MANY (A) NONE SEEN   Squamous Epithelial / LPF 0-5 0 - 5    Comment: Performed at [facility] [place], [address], [place], [place] [address]  Rapid urine drug screen (hospital performed)     Status: None   Collection Time: 02/05/19 10:30 PM  Result Value Ref Range   Opiates NONE DETECTED NONE DETECTED   Cocaine NONE DETECTED NONE DETECTED   Benzodiazepines NONE DETECTED NONE DETECTED   Amphetamines NONE DETECTED NONE DETECTED   Tetrahydrocannabinol NONE DETECTED NONE DETECTED   Barbiturates NONE DETECTED NONE DETECTED    Comment: (NOTE) DRUG SCREEN FOR MEDICAL PURPOSES ONLY.  IF CONFIRMATION IS NEEDED FOR ANY PURPOSE, NOTIFY LAB WITHIN 5 DAYS. LOWEST DETECTABLE LIMITS FOR URINE DRUG SCREEN Drug Class                     Cutoff (ng/mL) Amphetamine and metabolites    1000 Barbiturate and metabolites    200 Benzodiazepine                 [id number] Tricyclics and metabolites     300 Opiates and metabolites        300 Cocaine and metabolites        300 THC                            50 Performed at [facility], [address], [place], [place] [address]   Blood gas, venous (at [facility] and [facility], not at [facility])     Status: None   Collection Time: 02/05/19 11:45 PM  Result Value Ref Range   FIO2 28.00    pH, Ven 7.338 7.250 - 7.430   pCO2, Ven 51.3 44.0 - 60.0 mmHg   pO2, Ven 43.8 32.0 - 45.0 mmHg   Bicarbonate 24.7 20.0 - 28.0 mmol/L   Acid-Base Excess 1.6 0.0 - 2.0 mmol/L   O2 Saturation 69.5 %   Patient temperature 37.0     Comment: Performed at [facility], [address], [place], [place] [address]  Troponin I (High Sensitivity)     Status: Abnormal   Collection Time: 02/05/19 11:45 PM  Result Value Ref Range   Troponin I (High Sensitivity) 30 (H) <18 ng/L    Comment:  (NOTE) Elevated high sensitivity troponin I (hsTnI) values and significant  changes across serial measurements may suggest ACS but many other  chronic and acute conditions are known to elevate hsTnI results.  Refer to the "Links" section for chest pain algorithms and additional  guidance. Performed at [facility], [address] [place], [place] [address]    Ct Head Wo Contrast  Result Date: 02/06/2019 CLINICAL DATA:  Encephalopathy EXAM: CT HEAD WITHOUT CONTRAST TECHNIQUE: Contiguous axial images were obtained from the base of the skull through the vertex without intravenous contrast. COMPARISON:  Head CT 02/23/2017 FINDINGS: Brain: There is no mass, hemorrhage or extra-axial collection. There is generalized atrophy without lobar predilection. Hypodensity of the white matter is most commonly associated with chronic microvascular disease. Vascular: No abnormal hyperdensity of the major intracranial arteries or dural venous sinuses. No intracranial atherosclerosis. Skull: The visualized skull base, calvarium and extracranial soft tissues are normal. Sinuses/Orbits: No fluid levels or advanced mucosal thickening of  the visualized paranasal sinuses. No mastoid or middle ear effusion. The orbits are normal. IMPRESSION: Generalized atrophy and chronic microvascular ischemia without acute intracranial abnormality. Electronically Signed   By: Ulyses Jarred M.D.   On: 02/06/2019 00:57   Dg Chest Portable 1 View  Result Date: 02/05/2019 CLINICAL DATA:  Shortness of breath EXAM: PORTABLE CHEST 1 VIEW COMPARISON:  None. FINDINGS: The lung volumes are very low. The heart size is significantly enlarged. There are likely moderate sized bilateral pleural effusions with adjacent atelectasis. There are prominent interstitial lung markings bilaterally. There is no pneumothorax. No evidence for an acute osseous abnormality. IMPRESSION: 1. Very low lung volumes. 2. Cardiomegaly. 3. Overall findings concerning for  congestive heart failure with small to moderate-sized bilateral pleural effusions and adjacent compressive atelectasis. Electronically Signed   By: Constance Holster M.D.   On: 02/05/2019 22:33    Pending Labs Unresulted Labs (From admission, onward)    Start     Ordered   02/05/19 2340  SARS CORONAVIRUS 2 (TAT 6-24 HRS) Nasopharyngeal Nasopharyngeal Swab  (Asymptomatic/Tier 2 Patients Labs)  Once,   STAT    Question Answer Comment  Is this test for diagnosis or screening Screening   Symptomatic for COVID-19 as defined by CDC No   Hospitalized for COVID-19 No   Admitted to ICU for COVID-19 No   Previously tested for COVID-19 No   Resident in a congregate (group) care setting No   Employed in healthcare setting No   Pregnant No      02/05/19 2339          Vitals/Pain Today's Vitals   02/05/19 2102 02/05/19 2215 02/05/19 2300 02/06/19 0005  BP:  (!) 142/80  (!) 148/90  Pulse:  79 76   Resp:  (!) 23 15 17   Temp:      TempSrc:      SpO2:  94% 100%   PainSc: 0-No pain       Isolation Precautions No active isolations  Medications Medications  potassium chloride SA (KLOR-CON) CR tablet 40 mEq (40 mEq Oral Refused 02/06/19 0127)  nitroGLYCERIN (NITROGLYN) 2 % ointment 1 inch (1 inch Topical Given 02/06/19 0127)  furosemide (LASIX) injection 80 mg (80 mg Intravenous Given 02/06/19 0123)    Mobility non-ambulatory High fall risk   Focused Assessments    R Recommendations: See Admitting Provider Note  Report given to:   Additional Notes: confused at times

## 2019-02-07 ENCOUNTER — Observation Stay: Payer: Self-pay

## 2019-02-07 DIAGNOSIS — Z7982 Long term (current) use of aspirin: Secondary | ICD-10-CM | POA: Diagnosis not present

## 2019-02-07 DIAGNOSIS — E669 Obesity, unspecified: Secondary | ICD-10-CM | POA: Diagnosis present

## 2019-02-07 DIAGNOSIS — I509 Heart failure, unspecified: Secondary | ICD-10-CM | POA: Diagnosis not present

## 2019-02-07 DIAGNOSIS — G9349 Other encephalopathy: Secondary | ICD-10-CM | POA: Diagnosis present

## 2019-02-07 DIAGNOSIS — N183 Chronic kidney disease, stage 3 unspecified: Secondary | ICD-10-CM | POA: Diagnosis present

## 2019-02-07 DIAGNOSIS — I130 Hypertensive heart and chronic kidney disease with heart failure and stage 1 through stage 4 chronic kidney disease, or unspecified chronic kidney disease: Secondary | ICD-10-CM | POA: Diagnosis present

## 2019-02-07 DIAGNOSIS — E1122 Type 2 diabetes mellitus with diabetic chronic kidney disease: Secondary | ICD-10-CM | POA: Diagnosis present

## 2019-02-07 DIAGNOSIS — Z833 Family history of diabetes mellitus: Secondary | ICD-10-CM | POA: Diagnosis not present

## 2019-02-07 DIAGNOSIS — I5033 Acute on chronic diastolic (congestive) heart failure: Secondary | ICD-10-CM | POA: Diagnosis present

## 2019-02-07 DIAGNOSIS — D696 Thrombocytopenia, unspecified: Secondary | ICD-10-CM | POA: Diagnosis present

## 2019-02-07 DIAGNOSIS — Z8249 Family history of ischemic heart disease and other diseases of the circulatory system: Secondary | ICD-10-CM | POA: Diagnosis not present

## 2019-02-07 DIAGNOSIS — Z20828 Contact with and (suspected) exposure to other viral communicable diseases: Secondary | ICD-10-CM | POA: Diagnosis present

## 2019-02-07 DIAGNOSIS — N189 Chronic kidney disease, unspecified: Secondary | ICD-10-CM | POA: Diagnosis present

## 2019-02-07 DIAGNOSIS — Z79899 Other long term (current) drug therapy: Secondary | ICD-10-CM | POA: Diagnosis not present

## 2019-02-07 DIAGNOSIS — Z6837 Body mass index (BMI) 37.0-37.9, adult: Secondary | ICD-10-CM | POA: Diagnosis not present

## 2019-02-07 LAB — BASIC METABOLIC PANEL
Anion gap: 10 (ref 5–15)
BUN: 22 mg/dL (ref 8–23)
CO2: 27 mmol/L (ref 22–32)
Calcium: 9 mg/dL (ref 8.9–10.3)
Chloride: 108 mmol/L (ref 98–111)
Creatinine, Ser: 1.62 mg/dL — ABNORMAL HIGH (ref 0.44–1.00)
GFR calc Af Amer: 34 mL/min — ABNORMAL LOW (ref >60)
GFR calc non Af Amer: 29 mL/min — ABNORMAL LOW (ref >60)
Glucose, Bld: 108 mg/dL — ABNORMAL HIGH (ref 70–99)
Potassium: 3.1 mmol/L — ABNORMAL LOW (ref 3.5–5.1)
Sodium: 145 mmol/L (ref 135–145)

## 2019-02-07 LAB — CBC
HCT: 32.3 % — ABNORMAL LOW (ref 36.0–46.0)
Hemoglobin: 10.2 g/dL — ABNORMAL LOW (ref 12.0–15.0)
MCH: 27 pg (ref 26.0–34.0)
MCHC: 31.6 g/dL (ref 30.0–36.0)
MCV: 85.4 fL (ref 80.0–100.0)
Platelets: 111 10*3/uL — ABNORMAL LOW (ref 150–400)
RBC: 3.78 MIL/uL — ABNORMAL LOW (ref 3.87–5.11)
RDW: 19.6 % — ABNORMAL HIGH (ref 11.5–15.5)
WBC: 4.5 10*3/uL (ref 4.0–10.5)
nRBC: 0.4 % — ABNORMAL HIGH (ref 0.0–0.2)

## 2019-02-07 LAB — GLUCOSE, CAPILLARY
Glucose-Capillary: 95 mg/dL (ref 70–99)
Glucose-Capillary: 102 mg/dL — ABNORMAL HIGH (ref 70–99)
Glucose-Capillary: 90 mg/dL (ref 70–99)
Glucose-Capillary: 80 mg/dL (ref 70–99)

## 2019-02-07 LAB — AMMONIA: Ammonia: 17 umol/L (ref 9–35)

## 2019-02-07 MED ORDER — AMLODIPINE BESYLATE 5 MG PO TABS
5.0000 mg | ORAL_TABLET | Freq: Every day | ORAL | Status: DC
  Administered 2019-02-07: 5 mg via ORAL
  Filled 2019-02-07: qty 1

## 2019-02-07 MED ORDER — HYDRALAZINE HCL 25 MG PO TABS
25.0000 mg | ORAL_TABLET | Freq: Three times a day (TID) | ORAL | Status: DC
  Administered 2019-02-07 – 2019-02-12 (×15): 25 mg via ORAL
  Filled 2019-02-07 (×16): qty 1

## 2019-02-07 NOTE — Progress Notes (Signed)
Called vascular wellness to have PICC placed.

## 2019-02-07 NOTE — Progress Notes (Signed)
PROGRESS NOTE    Lindsey Mcguire  [id number] DOB: Sep 07, 1934 DOA: 02/05/2019 PCP: Leeroy Cha, MD   Brief Narrative:  Per HPI: Lindsey Mcguire a84 y.o.female,w hypertension, Dm2, Anemia, presents due to edema. Pt is sleepy and unwilling to provide history.   Patient was admitted with symptoms of dyspnea and edema likely secondary to acute on chronic diastolic CHF.  Prior 2D echocardiogram on 10/2018 with LVEF 60-65% noted.  Further history obtained from son and his fiance regarding her unwillingness to come to the hospital over the last 2 months despite worsening shortness of breath and volume overload.  Over the last 2 weeks it appears that she has been more altered as well and sleepy at home.  So there also appears to be a component of encephalopathy present that needs further evaluation.  Toxicology with no findings at this moment and urine analysis does not demonstrate any signs of UTI.  We will obtain ammonia levels as well as TSH to further evaluate.  ABG does not demonstrate any significant hypercapnia.  CT of the head with no acute findings.  10/11: Patient continues to diurese quite well and is negative almost 1 L fluid balance.  Her mentation appears to be improved this morning, however labs are pending as she is a difficult IV access.  She will require PICC line which has been ordered.  VQ scan still pending.  PT evaluation pending.  Assessment & Plan:   Principal Problem:   CHF (congestive heart failure) (HCC) Active Problems:   Anemia due to chronic renal failure treated with erythropoietin   Type 2 diabetes mellitus with stage 4 chronic kidney disease, without long-term current use of insulin (HCC)   Dyspnea secondary to acute on chronic diastolic CHF exacerbation -Recent 2D echocardiogram with LVEF 60-65% noted and no need for reevaluation currently -Continue on IV Lasix twice daily as prescribed -Monitor I's and O's and daily weights -Monitor  labs once PICC line placed -Continue telemetry  Moderate to large right-sided pleural effusion -We will plan to have thoracentesis in a.m. to assist further with dyspnea -This is bilateral and likely more related to CHF process  Chronic encephalopathy -Possibly related to hypothyroidism with elevated TSH of 14.48 noted -We will plan to check free T4 and consider levothyroxine initiation -Ammonia level pending  Elevated d-dimer -VQ scan currently pending  Hypertension-elevated -Continue on irbesartan -DC amlodipine due to edema -Start hydralazine 25 mg p.o. every 8 hours and monitor control  Type 2 diabetes -Blood glucose controlled -Continue sliding scale insulin -We will hold Actos on discharge and consider Farxiga  CKD stage III -Continue to monitor with ongoing diuresis -Repeat labs pending  Anemia/thrombocytopenia -Currently stable will monitor with repeat labs   DVT prophylaxis: SCDs Code Status: Full Family Communication: Spoke with son on 10/10 Disposition Plan: Continue IV diuresis and now PICC line placement for IV access and labs.  Continue to evaluate thyroid function.  VQ scan pending.  PT evaluation pending.   Consultants:   None  Procedures:   None  Antimicrobials:   None   Subjective: Patient seen and evaluated today with no new acute complaints or concerns. No acute concerns or events noted overnight.  It has been difficult to obtain lab work on her as she is a difficult IV access.  Objective: Vitals:   02/06/19 1440 02/06/19 2110 02/07/19 0433 02/07/19 0934  BP: 136/79 (!) 145/83 (!) 148/100 124/83  Pulse: 76 67 77   Resp: 20 20 14    Temp:  98.8  F (37.1 C) 98.2 F (36.8 C)   TempSrc:  Oral Oral   SpO2: 97% 94% 100%   Weight:   94.8 kg   Height:        Intake/Output Summary (Last 24 hours) at 02/07/2019 1041 Last data filed at 02/07/2019 0859 Gross per 24 hour  Intake 180 ml  Output 1100 ml  Net -920 ml   Filed Weights    02/06/19 0500 02/07/19 0433  Weight: 95.5 kg 94.8 kg    Examination:  General exam: Appears calm and comfortable  Respiratory system: Clear to auscultation. Respiratory effort normal. Cardiovascular system: S1 & S2 heard, RRR. No JVD, murmurs, rubs, gallops or clicks.  Scant pedal edema noted. Gastrointestinal system: Abdomen is nondistended, soft and nontender. No organomegaly or masses felt. Normal bowel sounds heard. Central nervous system: Alert and oriented. No focal neurological deficits. Extremities: Symmetric 5 x 5 power. Skin: No rashes, lesions or ulcers Psychiatry: Judgement and insight appear normal. Mood & affect appropriate.     Data Reviewed: I have personally reviewed following labs and imaging studies  CBC: Recent Labs  Lab 02/01/19 1105 02/05/19 2154 02/06/19 1233  WBC 4.0 4.4 4.0  NEUTROABS 3.3 3.5  --   HGB 10.2* 11.2* 10.6*  HCT 31.0* 36.0 33.5*  MCV 82.4 86.1 85.7  PLT 109* 118* [id number]*   Basic Metabolic Panel: Recent Labs  Lab 02/01/19 1105 02/05/19 2154 02/06/19 0624  NA 142 141 141  K 3.3* 3.3* 4.9  CL 108 106 109  CO2 25 23 25   GLUCOSE 143* 167* 140*  BUN 28* 23 24*  CREATININE 1.98* 1.53* 1.63*  CALCIUM 8.6* 8.9 8.5*   GFR: Estimated Creatinine Clearance: 27.4 mL/min (A) (by C-G formula based on SCr of 1.63 mg/dL (H)). Liver Function Tests: Recent Labs  Lab 02/01/19 1105 02/05/19 2154 02/06/19 0624  AST 21 26 32  ALT 15 18 21   ALKPHOS 172* 168* 146*  BILITOT 0.8 1.2 1.8*  PROT 7.2 7.6 7.0  ALBUMIN 2.9* 3.1* 2.8*   No results for input(s): LIPASE, AMYLASE in the last 168 hours. No results for input(s): AMMONIA in the last 168 hours. Coagulation Profile: No results for input(s): INR, PROTIME in the last 168 hours. Cardiac Enzymes: No results for input(s): CKTOTAL, CKMB, CKMBINDEX, TROPONINI in the last 168 hours. BNP (last 3 results) No results for input(s): PROBNP in the last 8760 hours. HbA1C: No results for input(s):  HGBA1C in the last 72 hours. CBG: Recent Labs  Lab 02/06/19 0849 02/06/19 1202 02/06/19 1612 02/06/19 2109 02/07/19 0756  GLUCAP 137* 148* 129* 113* 95   Lipid Profile: No results for input(s): CHOL, HDL, LDLCALC, TRIG, CHOLHDL, LDLDIRECT in the last 72 hours. Thyroid Function Tests: Recent Labs    02/06/19 1017  TSH 14.487*   Anemia Panel: No results for input(s): VITAMINB12, FOLATE, FERRITIN, TIBC, IRON, RETICCTPCT in the last 72 hours. Sepsis Labs: No results for input(s): PROCALCITON, LATICACIDVEN in the last 168 hours.  Recent Results (from the past 240 hour(s))  SARS CORONAVIRUS 2 (TAT 6-24 HRS) Nasopharyngeal Nasopharyngeal Swab     Status: None   Collection Time: 02/05/19 11:49 PM   Specimen: Nasopharyngeal Swab  Result Value Ref Range Status   SARS Coronavirus 2 NEGATIVE NEGATIVE Final    Comment: (NOTE) SARS-CoV-2 target nucleic acids are NOT DETECTED. The SARS-CoV-2 RNA is generally detectable in upper and lower respiratory specimens during the acute phase of infection. Negative results do not preclude SARS-CoV-2 infection, do not rule  out co-infections with other pathogens, and should not be used as the sole basis for treatment or other patient management decisions. Negative results must be combined with clinical observations, patient history, and epidemiological information. The expected result is Negative. Fact Sheet for Patients: SugarRoll.be Fact Sheet for Healthcare Providers: https://www.woods-mathews.com/ This test is not yet approved or cleared by the [place] FDA and  has been authorized for detection and/or diagnosis of SARS-CoV-2 by FDA under an Emergency Use Authorization (EUA). This EUA will remain  in effect (meaning this test can be used) for the duration of the COVID-19 declaration under Section 56 4(b)(1) of the Act, 21 U.S.C. section 360bbb-3(b)(1), unless the authorization is terminated or  revoked sooner. Performed at [facility], [place] [address] [place], [place] [address]          Radiology Studies: Ct Head Wo Contrast  Result Date: 02/06/2019 CLINICAL DATA:  Encephalopathy EXAM: CT HEAD WITHOUT CONTRAST TECHNIQUE: Contiguous axial images were obtained from the base of the skull through the vertex without intravenous contrast. COMPARISON:  Head CT 02/23/2017 FINDINGS: Brain: There is no mass, hemorrhage or extra-axial collection. There is generalized atrophy without lobar predilection. Hypodensity of the white matter is most commonly associated with chronic microvascular disease. Vascular: No abnormal hyperdensity of the major intracranial arteries or dural venous sinuses. No intracranial atherosclerosis. Skull: The visualized skull base, calvarium and extracranial soft tissues are normal. Sinuses/Orbits: No fluid levels or advanced mucosal thickening of the visualized paranasal sinuses. No mastoid or middle ear effusion. The orbits are normal. IMPRESSION: Generalized atrophy and chronic microvascular ischemia without acute intracranial abnormality. Electronically Signed   By: Ulyses Jarred M.D.   On: 02/06/2019 00:57   Ct Chest Wo Contrast  Result Date: 02/06/2019 CLINICAL DATA:  Pleural effusion, AMS Ems was called out earlier for sob and pt refused to be evaluated so family took out ivc paperwork. Per family pt is not compliant with home meds and has been having visual and auditory hallucinations. When.*comment was truncated*Pleural effusion EXAM: CT CHEST WITHOUT CONTRAST TECHNIQUE: Multidetector CT imaging of the chest was performed following the standard protocol without IV contrast. COMPARISON:  None. FINDINGS: Cardiovascular: Coronary artery calcification and aortic atherosclerotic calcification. Mediastinum/Nodes: No axillary supraclavicular adenopathy. No mediastinal adenopathy. Trace pericardial effusion. Lungs/Pleura: Very low lung volumes. Moderate to large  RIGHT pleural effusion. Small LEFT pleural effusion. There is bibasilar atelectasis. Some patchy airspace disease in the lungs suggesting edema or infection. Upper Abdomen: Limited view of the liver, kidneys, pancreas are unremarkable. Normal adrenal glands. Musculoskeletal: Anasarca of the soft tissues. No aggressive osseous lesion IMPRESSION: 1. Moderate to large RIGHT pleural effusion and small LEFT pleural effusion. 2. Bibasilar atelectasis. 3. Patchy airspace disease in the lungs suggesting edema or infection. 4. Anasarca of the soft tissues. Aortic Atherosclerosis (ICD10-I70.0). Electronically Signed   By: Suzy Bouchard M.D.   On: 02/06/2019 04:32   Dg Chest Portable 1 View  Result Date: 02/05/2019 CLINICAL DATA:  Shortness of breath EXAM: PORTABLE CHEST 1 VIEW COMPARISON:  None. FINDINGS: The lung volumes are very low. The heart size is significantly enlarged. There are likely moderate sized bilateral pleural effusions with adjacent atelectasis. There are prominent interstitial lung markings bilaterally. There is no pneumothorax. No evidence for an acute osseous abnormality. IMPRESSION: 1. Very low lung volumes. 2. Cardiomegaly. 3. Overall findings concerning for congestive heart failure with small to moderate-sized bilateral pleural effusions and adjacent compressive atelectasis. Electronically Signed   By: Jamie Kato.D.  On: 02/05/2019 22:33   [place] Ekg Site Rite  Result Date: 02/07/2019 If Site Rite image not attached, placement could not be confirmed due to current cardiac rhythm.       Scheduled Meds: . amLODipine  5 mg Oral Daily  . aspirin  81 mg Oral Daily  . atorvastatin  40 mg Oral q1800  . enoxaparin (LOVENOX) injection  30 mg Subcutaneous Q24H  . furosemide  40 mg Intravenous Q12H  . insulin aspart  0-5 Units Subcutaneous QHS  . insulin aspart  0-9 Units Subcutaneous TID WC  . irbesartan  300 mg Oral Daily  . potassium chloride SA  40 mEq Oral Once    Continuous Infusions:   LOS: 0 days    Time spent: 30 minutes    Pratik Darleen Crocker, DO [facility] Pager [phone]  If 7PM-7AM, please contact night-coverage [email or web address] Password [email or web address] 02/07/2019, 10:41 AM

## 2019-02-07 NOTE — Plan of Care (Signed)

## 2019-02-07 NOTE — Progress Notes (Signed)
Spoke with Larene Beach RN re PICC order.  Larene Beach RN aware to notify CVW for PICC placement.

## 2019-02-08 ENCOUNTER — Inpatient Hospital Stay (HOSPITAL_COMMUNITY): Payer: Medicare Other

## 2019-02-08 LAB — PROTEIN, PLEURAL OR PERITONEAL FLUID: Total protein, fluid: <3 g/dL

## 2019-02-08 LAB — T4, FREE
Free T4: 1.24 ng/dL — ABNORMAL HIGH (ref 0.61–1.12)
Free T4: 1.31 ng/dL — ABNORMAL HIGH (ref 0.61–1.12)

## 2019-02-08 LAB — BASIC METABOLIC PANEL
Anion gap: 13 (ref 5–15)
BUN: 23 mg/dL (ref 8–23)
CO2: 27 mmol/L (ref 22–32)
Calcium: 9.3 mg/dL (ref 8.9–10.3)
Chloride: 105 mmol/L (ref 98–111)
Creatinine, Ser: 1.67 mg/dL — ABNORMAL HIGH (ref 0.44–1.00)
GFR calc Af Amer: 33 mL/min — ABNORMAL LOW (ref >60)
GFR calc non Af Amer: 28 mL/min — ABNORMAL LOW (ref >60)
Glucose, Bld: 94 mg/dL (ref 70–99)
Potassium: 3.1 mmol/L — ABNORMAL LOW (ref 3.5–5.1)
Sodium: 145 mmol/L (ref 135–145)

## 2019-02-08 LAB — GLUCOSE, CAPILLARY
Glucose-Capillary: 108 mg/dL — ABNORMAL HIGH (ref 70–99)
Glucose-Capillary: 105 mg/dL — ABNORMAL HIGH (ref 70–99)
Glucose-Capillary: 143 mg/dL — ABNORMAL HIGH (ref 70–99)

## 2019-02-08 LAB — GRAM STAIN: Gram Stain: NONE SEEN

## 2019-02-08 LAB — PROTIME-INR
INR: 1.3 — ABNORMAL HIGH (ref 0.8–1.2)
Prothrombin Time: 16 seconds — ABNORMAL HIGH (ref 11.4–15.2)

## 2019-02-08 LAB — AMMONIA: Ammonia: 17 umol/L (ref 9–35)

## 2019-02-08 LAB — MAGNESIUM: Magnesium: 1.9 mg/dL (ref 1.7–2.4)

## 2019-02-08 LAB — GLUCOSE, PLEURAL OR PERITONEAL FLUID: Glucose, Fluid: 108 mg/dL

## 2019-02-08 MED ORDER — CHLORHEXIDINE GLUCONATE CLOTH 2 % EX PADS
6.0000 | MEDICATED_PAD | Freq: Every day | CUTANEOUS | Status: DC
  Administered 2019-02-08 – 2019-02-12 (×6): 6 via TOPICAL

## 2019-02-08 MED ORDER — TECHNETIUM TC 99M DIETHYLENETRIAME-PENTAACETIC ACID
40.0000 | Freq: Once | INTRAVENOUS | Status: AC | PRN
  Administered 2019-02-08: 12:00:00 32.4 via RESPIRATORY_TRACT

## 2019-02-08 MED ORDER — HALOPERIDOL LACTATE 5 MG/ML IJ SOLN: 1.0000 mg | Freq: Four times a day (QID) | INTRAMUSCULAR | Status: DC | PRN

## 2019-02-08 MED ORDER — POTASSIUM CHLORIDE CRYS ER 20 MEQ PO TBCR
40.0000 meq | EXTENDED_RELEASE_TABLET | Freq: Two times a day (BID) | ORAL | Status: DC
  Administered 2019-02-08 – 2019-02-10 (×5): 40 meq via ORAL
  Filled 2019-02-08 (×5): qty 2

## 2019-02-08 MED ORDER — TECHNETIUM TO 99M ALBUMIN AGGREGATED
1.5000 | Freq: Once | INTRAVENOUS | Status: AC | PRN
  Administered 2019-02-08: 1.5 via INTRAVENOUS

## 2019-02-08 NOTE — Progress Notes (Signed)
Thoracentesis complete no signs of distress.  

## 2019-02-08 NOTE — Evaluation (Signed)
Physical Therapy Evaluation Patient Details Name: Lindsey Mcguire MRN: [id number] DOB: Jan 10, 1935 Today's Date: 02/08/2019   History of Present Illness  Lindsey Mcguire  is a 84 y.o. female, w hypertension, Dm2, Anemia, presents due to edema.  Pt is sleepy and unwilling to provide history.    Clinical Impression  Patient limited for functional mobility as stated below secondary to BLE weakness, fatigue and poor standing balance.  Patient demonstrates slow labored bed mobility, required repeated verbal/tactile cueing to safely complete sit to stands/transfers and limited to a few steps at bedside.  Patient SpO2 at 84% while on room air and put back on 2 LPM O2 and tolerated staying up in chair after therapy - NT aware.  Patient will benefit from continued physical therapy in hospital and recommended venue below to increase strength, balance, endurance for safe ADLs and gait.     Follow Up Recommendations SNF    Equipment Recommendations  None recommended by PT    Recommendations for Other Services       Precautions / Restrictions Precautions Precautions: Fall Restrictions Weight Bearing Restrictions: No      Mobility  Bed Mobility Overal bed mobility: Needs Assistance Bed Mobility: Supine to Sit     Supine to sit: Min assist     General bed mobility comments: slow labored movement  Transfers Overall transfer level: Needs assistance Equipment used: Rolling walker (2 wheeled) Transfers: Sit to/from [facility] Sit to Stand: Min assist;Mod assist Stand pivot transfers: Min assist;Mod assist       General transfer comment: required repeated verbal/tactile cueing for proper hand placement with fair carryover demonstrated  Ambulation/Gait Ambulation/Gait assistance: Min assist;Mod assist Gait Distance (Feet): 6 Feet Assistive device: Rolling walker (2 wheeled) Gait Pattern/deviations: Decreased step length - right;Decreased step length -  left;Decreased stride length Gait velocity: decreased   General Gait Details: limited to 6-7 slow labored steps at bedside due to fatigue  Stairs            Wheelchair Mobility    Modified Rankin (Stroke Patients Only)       Balance Overall balance assessment: Needs assistance Sitting-balance support: Feet supported;No upper extremity supported Sitting balance-Leahy Scale: Fair Sitting balance - Comments: fair/good seated at bedside   Standing balance support: Bilateral upper extremity supported;During functional activity Standing balance-Leahy Scale: Fair Standing balance comment: using RW                             Pertinent Vitals/Pain Pain Assessment: No/denies pain    Home Living Family/patient expects to be discharged to:: Private residence Living Arrangements: Spouse/significant other Available Help at Discharge: Family;Available PRN/intermittently Type of Home: Mobile home Home Access: Stairs to enter Entrance Stairs-Rails: Right;Left;Can reach both Entrance Stairs-Number of Steps: 6 Home Layout: One level Home Equipment: Walker - 2 wheels;Cane - single point      Prior Function Level of Independence: Independent with assistive device(s)         Comments: household ambulator with RW PRN     Hand Dominance        Extremity/Trunk Assessment   Upper Extremity Assessment Upper Extremity Assessment: Generalized weakness    Lower Extremity Assessment Lower Extremity Assessment: Generalized weakness    Cervical / Trunk Assessment Cervical / Trunk Assessment: Kyphotic  Communication   Communication: HOH  Cognition Arousal/Alertness: Awake/alert Behavior During Therapy: WFL for tasks assessed/performed Overall Cognitive Status: Within Functional Limits for tasks assessed  General Comments: required repeated verbal cueing due to [place] Comments      Exercises      Assessment/Plan    PT Assessment Patient needs continued PT services  PT Problem List Decreased strength;Decreased activity tolerance;Decreased balance;Decreased mobility       PT Treatment Interventions Gait training;Stair training;Functional mobility training;Therapeutic activities;Therapeutic exercise;Patient/family education    PT Goals (Current goals can be found in the Care Plan section)  Acute Rehab PT Goals Patient Stated Goal: return home PT Goal Formulation: With patient Time For Goal Achievement: 02/22/19 Potential to Achieve Goals: Good    Frequency Min 3X/week   Barriers to discharge        Co-evaluation               AM-PAC PT "6 Clicks" Mobility  Outcome Measure Help needed turning from your back to your side while in a flat bed without using bedrails?: A Little Help needed moving from lying on your back to sitting on the side of a flat bed without using bedrails?: A Little Help needed moving to and from a bed to a chair (including a wheelchair)?: A Lot Help needed standing up from a chair using your arms (e.g., wheelchair or bedside chair)?: A Lot Help needed to walk in hospital room?: A Lot Help needed climbing 3-5 steps with a railing? : A Lot 6 Click Score: 14    End of Session Equipment Utilized During Treatment: Oxygen Activity Tolerance: Patient tolerated treatment well;Patient limited by fatigue Patient left: in chair;with call bell/phone within reach;with chair alarm set Nurse Communication: Mobility status PT Visit Diagnosis: Unsteadiness on feet (R26.81);Other abnormalities of gait and mobility (R26.89);Muscle weakness (generalized) (M62.81)    Time: [id number] PT Time Calculation (min) (ACUTE ONLY): 31 min   Charges:   PT Evaluation $PT Eval Moderate Complexity: 1 Mod PT Treatments $Therapeutic Activity: 23-37 mins        3:59 PM, 02/08/19 Lonell Grandchild, MPT Physical Therapist with [facility] 336  [phone] office [phone] mobile phone

## 2019-02-08 NOTE — Plan of Care (Signed)
  Problem: Acute Rehab PT Goals(only PT should resolve) Goal: Pt Will Go Supine/Side To Sit Outcome: Progressing Flowsheets (Taken 02/08/2019 1600) Pt will go Supine/Side to Sit:  with supervision  with min guard assist Goal: Patient Will Transfer Sit To/From Stand Outcome: Progressing Flowsheets (Taken 02/08/2019 1600) Patient will transfer sit to/from stand:  with min guard assist  with minimal assist Goal: Pt Will Transfer Bed To Chair/Chair To Bed Outcome: Progressing Flowsheets (Taken 02/08/2019 1600) Pt will Transfer Bed to Chair/Chair to Bed:  min guard assist  with min assist Goal: Pt Will Ambulate Outcome: Progressing Flowsheets (Taken 02/08/2019 1600) Pt will Ambulate:  50 feet  with minimal assist  with rolling walker  with min guard assist   4:01 PM, 02/08/19 Lonell Grandchild, MPT Physical Therapist with [facility] 336 [phone] office [phone] mobile phone

## 2019-02-08 NOTE — Progress Notes (Signed)
PROGRESS NOTE    Lindsey Mcguire  [id number] DOB: [birth year] DOA: 02/05/2019 PCP: Leeroy Cha, MD   Brief Narrative:  Per HPI: [name] a84 y.o.female,w hypertension, Dm2, Anemia, presents due to edema. Pt is sleepy and unwilling to provide history.  Patient was admitted with symptoms of dyspnea and edema likely secondary to acute on chronic diastolic CHF. Prior 2D echocardiogram on 10/2018 with LVEF 60-65% noted. Further history obtained from son and his fiance regarding her unwillingness to come to the hospital over the last 2 months despite worsening shortness of breath and volume overload. Over the last 2 weeks it appears that she has been more altered as well and sleepy at home. So there also appears to be a component of encephalopathy present that needs further evaluation. Toxicology with no findings at this moment and urine analysis does not demonstrate any signs of UTI. We will obtain ammonia levels as well as TSH to further evaluate. ABG does not demonstrate any significant hypercapnia. CT of the head with no acute findings.  10/11: Patient continues to diurese quite well and is negative almost 1 L fluid balance.  Her mentation appears to be improved this morning, however labs are pending as she is a difficult IV access.  She will require PICC line which has been ordered.  VQ scan still pending.  PT evaluation pending.  10/12: Patient continues to diurese well with close to 2 L net negative fluid balance noted overnight.  She appears to be improving steadily.  Will reorder ammonia and free T4 levels as these appear not to have been collected.  VQ scan still pending, but low suspicion for PE.  PICC line ordered and placed.  PT evaluation ordered.  Patient for thoracentesis today due to moderate to large fluid collection to right pleural cavity.  Assessment & Plan:   Principal Problem:   CHF (congestive heart failure) (HCC) Active Problems:  Anemia due to chronic renal failure treated with erythropoietin   Type 2 diabetes mellitus with stage 4 chronic kidney disease, without long-term current use of insulin (HCC)   Acute CHF (congestive heart failure) (HCC)   Dyspnea secondary to acute on chronic diastolic CHF exacerbation -Recent 2D echocardiogram with LVEF 60-65% noted and no need for reevaluation currently -Continue on IV Lasix twice daily as prescribed -Monitor I's and O's and daily weights -Monitor labs once PICC line placed -Continue telemetry  Moderate to large right-sided pleural effusion -We will plan to have thoracentesis today -This is bilateral and likely more related to CHF process  Chronic encephalopathy -Possibly related to hypothyroidism with elevated TSH of 14.48 noted -We will plan to check free T4 and consider levothyroxine initiation -Ammonia level pending  Elevated d-dimer -VQ scan ordered  Hypertension- improved control -Continue on irbesartan -DC amlodipine due to edema -Start hydralazine 25 mg p.o. every 8 hours and monitor control  Type 2 diabetes -Blood glucose controlled -Continue sliding scale insulin -We will hold Actos on discharge and consider Farxiga  CKD stage III -Continue to monitor with ongoing diuresis -Repeat labs pending  Anemia/thrombocytopenia -Currently stable will monitor with repeat labs   DVT prophylaxis: SCDs Code Status: Full Family Communication: Spoke with son on 10/10 Disposition Plan: Continue IV diuresis and now PICC line placement for IV access and labs.  Continue to evaluate thyroid function.  VQ scan pending.  PT evaluation pending.   Consultants:   None  Procedures:   None  Antimicrobials:   None  Subjective: Patient seen and evaluated today with  no new acute complaints or concerns. No acute concerns or events noted overnight.  She appears to be breathing better this morning.  She has diuresed well overnight.  Objective:  Vitals:   02/07/19 1344 02/07/19 2113 02/08/19 0607 02/08/19 0750  BP: 136/81 139/81 126/80 137/83  Pulse: 71 91 87 79  Resp: 16 20 20 18   Temp: 98.5 F (36.9 C) 98.2 F (36.8 C) 98.7 F (37.1 C) 98.7 F (37.1 C)  TempSrc:  Oral Oral Oral  SpO2: 98% 92% 97% 96%  Weight:   98.7 kg   Height:        Intake/Output Summary (Last 24 hours) at 02/08/2019 1150 Last data filed at 02/08/2019 0500 Gross per 24 hour  Intake 0 ml  Output 1950 ml  Net -1950 ml   Filed Weights   02/06/19 0500 02/07/19 0433 02/08/19 0607  Weight: 95.5 kg 94.8 kg 98.7 kg    Examination:  General exam: Appears calm and comfortable  Respiratory system: Clear to auscultation. Respiratory effort normal.  Currently on 2 L nasal cannula oxygen Cardiovascular system: S1 & S2 heard, RRR. No JVD, murmurs, rubs, gallops or clicks. No pedal edema. Gastrointestinal system: Abdomen is nondistended, soft and nontender. No organomegaly or masses felt. Normal bowel sounds heard. Central nervous system: Alert and oriented. No focal neurological deficits. Extremities: Symmetric 5 x 5 power.  PICC line to right upper extremity. Skin: No rashes, lesions or ulcers Psychiatry: Judgement and insight appear normal. Mood & affect appropriate.     Data Reviewed: I have personally reviewed following labs and imaging studies  CBC: Recent Labs  Lab 02/05/19 2154 02/06/19 1233 02/07/19 1329  WBC 4.4 4.0 4.5  NEUTROABS 3.5  --   --   HGB 11.2* 10.6* 10.2*  HCT 36.0 33.5* 32.3*  MCV 86.1 85.7 85.4  PLT 118* 115* [id number]*   Basic Metabolic Panel: Recent Labs  Lab 02/05/19 2154 02/06/19 0624 02/07/19 1329 02/08/19 0500  NA 141 141 145 145  K 3.3* 4.9 3.1* 3.1*  CL 106 109 108 105  CO2 23 25 27 27   GLUCOSE 167* 140* 108* 94  BUN 23 24* 22 23  CREATININE 1.53* 1.63* 1.62* 1.67*  CALCIUM 8.9 8.5* 9.0 9.3  MG  --   --   --  1.9   GFR: Estimated Creatinine Clearance: 27.4 mL/min (A) (by C-G formula based on SCr of  1.67 mg/dL (H)). Liver Function Tests: Recent Labs  Lab 02/05/19 2154 02/06/19 0624  AST 26 32  ALT 18 21  ALKPHOS 168* 146*  BILITOT 1.2 1.8*  PROT 7.6 7.0  ALBUMIN 3.1* 2.8*   No results for input(s): LIPASE, AMYLASE in the last 168 hours. Recent Labs  Lab 02/07/19 1329  AMMONIA 17   Coagulation Profile: No results for input(s): INR, PROTIME in the last 168 hours. Cardiac Enzymes: No results for input(s): CKTOTAL, CKMB, CKMBINDEX, TROPONINI in the last 168 hours. BNP (last 3 results) No results for input(s): PROBNP in the last 8760 hours. HbA1C: No results for input(s): HGBA1C in the last 72 hours. CBG: Recent Labs  Lab 02/07/19 0756 02/07/19 1120 02/07/19 1614 02/07/19 2121 02/08/19 0720  GLUCAP 95 102* 90 80 108*   Lipid Profile: No results for input(s): CHOL, HDL, LDLCALC, TRIG, CHOLHDL, LDLDIRECT in the last 72 hours. Thyroid Function Tests: Recent Labs    02/06/19 1017  TSH 14.487*   Anemia Panel: No results for input(s): VITAMINB12, FOLATE, FERRITIN, TIBC, IRON, RETICCTPCT in the last 72 hours.  Sepsis Labs: No results for input(s): PROCALCITON, LATICACIDVEN in the last 168 hours.  Recent Results (from the past 240 hour(s))  SARS CORONAVIRUS 2 (TAT 6-24 HRS) Nasopharyngeal Nasopharyngeal Swab     Status: None   Collection Time: 02/05/19 11:49 PM   Specimen: Nasopharyngeal Swab  Result Value Ref Range Status   SARS Coronavirus 2 NEGATIVE NEGATIVE Final    Comment: (NOTE) SARS-CoV-2 target nucleic acids are NOT DETECTED. The SARS-CoV-2 RNA is generally detectable in upper and lower respiratory specimens during the acute phase of infection. Negative results do not preclude SARS-CoV-2 infection, do not rule out co-infections with other pathogens, and should not be used as the sole basis for treatment or other patient management decisions. Negative results must be combined with clinical observations, patient history, and epidemiological information.  The expected result is Negative. Fact Sheet for Patients: SugarRoll.be Fact Sheet for Healthcare Providers: https://www.woods-mathews.com/ This test is not yet approved or cleared by the [place] FDA and  has been authorized for detection and/or diagnosis of SARS-CoV-2 by FDA under an Emergency Use Authorization (EUA). This EUA will remain  in effect (meaning this test can be used) for the duration of the COVID-19 declaration under Section 56 4(b)(1) of the Act, 21 U.S.C. section 360bbb-3(b)(1), unless the authorization is terminated or revoked sooner. Performed at [facility], [place] [address], [place], [place] [address]          Radiology Studies: [place] Ekg Site Rite  Result Date: 02/07/2019 If [facility] image not attached, placement could not be confirmed due to current cardiac rhythm.       Scheduled Meds: . aspirin  81 mg Oral Daily  . atorvastatin  40 mg Oral q1800  . Chlorhexidine Gluconate Cloth  6 each Topical Daily  . enoxaparin (LOVENOX) injection  30 mg Subcutaneous Q24H  . furosemide  40 mg Intravenous Q12H  . hydrALAZINE  25 mg Oral Q8H  . insulin aspart  0-5 Units Subcutaneous QHS  . insulin aspart  0-9 Units Subcutaneous TID WC  . irbesartan  300 mg Oral Daily  . potassium chloride SA  40 mEq Oral Once  . potassium chloride  40 mEq Oral BID   Continuous Infusions:   LOS: 1 day    Time spent: 30 minutes    Naoma Boxell Darleen Crocker, DO [facility] Pager [phone]  If 7PM-7AM, please contact night-coverage [email or web address] Password [email or web address] 02/08/2019, 11:50 AM

## 2019-02-09 LAB — CBC
HCT: 31.5 % — ABNORMAL LOW (ref 36.0–46.0)
Hemoglobin: 10.1 g/dL — ABNORMAL LOW (ref 12.0–15.0)
MCH: 26.9 pg (ref 26.0–34.0)
MCHC: 32.1 g/dL (ref 30.0–36.0)
MCV: 84 fL (ref 80.0–100.0)
Platelets: 112 10*3/uL — ABNORMAL LOW (ref 150–400)
RBC: 3.75 MIL/uL — ABNORMAL LOW (ref 3.87–5.11)
RDW: 19.4 % — ABNORMAL HIGH (ref 11.5–15.5)
WBC: 5.1 10*3/uL (ref 4.0–10.5)
nRBC: 0.6 % — ABNORMAL HIGH (ref 0.0–0.2)

## 2019-02-09 LAB — GLUCOSE, CAPILLARY
Glucose-Capillary: 112 mg/dL — ABNORMAL HIGH (ref 70–99)
Glucose-Capillary: 130 mg/dL — ABNORMAL HIGH (ref 70–99)
Glucose-Capillary: 133 mg/dL — ABNORMAL HIGH (ref 70–99)
Glucose-Capillary: 120 mg/dL — ABNORMAL HIGH (ref 70–99)

## 2019-02-09 LAB — BASIC METABOLIC PANEL
Anion gap: 12 (ref 5–15)
BUN: 23 mg/dL (ref 8–23)
CO2: 28 mmol/L (ref 22–32)
Calcium: 8.9 mg/dL (ref 8.9–10.3)
Chloride: 105 mmol/L (ref 98–111)
Creatinine, Ser: 1.59 mg/dL — ABNORMAL HIGH (ref 0.44–1.00)
GFR calc Af Amer: 35 mL/min — ABNORMAL LOW (ref >60)
GFR calc non Af Amer: 30 mL/min — ABNORMAL LOW (ref >60)
Glucose, Bld: 128 mg/dL — ABNORMAL HIGH (ref 70–99)
Potassium: 3.3 mmol/L — ABNORMAL LOW (ref 3.5–5.1)
Sodium: 145 mmol/L (ref 135–145)

## 2019-02-09 LAB — MAGNESIUM: Magnesium: 1.8 mg/dL (ref 1.7–2.4)

## 2019-02-09 NOTE — Progress Notes (Signed)
Dressing to right upper back saturated with blood. Thoracentesis site continues to ooze blood.  Dressing changed and another pressure dressing applied with 4x4's and ABD pads. Vital signs stable, patient not c/o pain. Will continue to monitor.

## 2019-02-09 NOTE — Progress Notes (Signed)
PROGRESS NOTE    Lindsey Mcguire  [id number] DOB: 04/18/1935 DOA: 02/05/2019 PCP: Leeroy Cha, MD   Brief Narrative:  Per HPI: [name] a84 y.o.female,w hypertension, Dm2, Anemia, presents due to edema. Pt is sleepy and unwilling to provide history.  Patient was admitted with symptoms of dyspnea and edema likely secondary to acute on chronic diastolic CHF. Prior 2D echocardiogram on 10/2018 with LVEF 60-65% noted. Further history obtained from son and his fiance regarding her unwillingness to come to the hospital over the last 2 months despite worsening shortness of breath and volume overload. Over the last 2 weeks it appears that she has been more altered as well and sleepy at home. So there also appears to be a component of encephalopathy present that needs further evaluation. Toxicology with no findings at this moment and urine analysis does not demonstrate any signs of UTI. We will obtain ammonia levels as well as TSH to further evaluate. ABG does not demonstrate any significant hypercapnia. CT of the head with no acute findings.  10/11:Patient continues to diurese quite well and is negative almost 1 L fluid balance. Her mentation appears to be improved this morning, however labs are pending as she is a difficult IV access. She will require PICC line which has been ordered. VQ scan still pending. PT evaluation pending.  10/12: Patient continues to diurese well with close to 2 L net negative fluid balance noted overnight.  She appears to be improving steadily.  Will reorder ammonia and free T4 levels as these appear not to have been collected.  VQ scan still pending, but low suspicion for PE.  PICC line ordered and placed.  PT evaluation ordered.  Patient for thoracentesis today due to moderate to large fluid collection to right pleural cavity.  10/13: Patient continues to diurese well.  Ammonia level is within normal limits and T4 levels are  elevated.  VQ scan with low probability for PE noted.  PT evaluation reveals need for SNF.  Thoracentesis performed on 10/12 with 800 cc of fluid removed and patient is struggling with some bleeding from the site at the moment.  Assessment & Plan:   Principal Problem:   CHF (congestive heart failure) (HCC) Active Problems:   Anemia due to chronic renal failure treated with erythropoietin   Type 2 diabetes mellitus with stage 4 chronic kidney disease, without long-term current use of insulin (HCC)   Acute CHF (congestive heart failure) (HCC)   Dyspnea secondary to acute on chronic diastolic CHF exacerbation-improving -Recent 2D echocardiogram with LVEF 60-65% noted and no need for reevaluation currently -Continue on IV Lasix twice daily as prescribed -Monitor I's and O's and daily weights -Monitor labs once PICC line placed -Continue telemetry  Moderate to large right-sided pleural effusion -Status post thoracentesis of 800 cc of fluid 10/12 -Monitor ongoing bleeding and will add pressure dressings  Chronic encephalopathy -Possibly related to hypothyroidism with elevated TSH of 14.48 noted -T4 levels are noted to be 1.31 -Ammonia levels are normal  Elevated d-dimer -VQ scan  with low probability for PE noted  Hypertension- improved control -Continue onirbesartan -DCamlodipine due to edema -Continue hydralazine 25 mg p.o. every 8 hours and monitor control  Type 2 diabetes -Blood glucose controlled -Continue sliding scale insulin -We will hold Actos on discharge and consider Farxiga  CKD stage III -Continue to monitor with ongoing diuresis -Repeat labs pending  Anemia/thrombocytopenia -Currently stable will monitor with repeat labs -We will continue to monitor along with INR in the a.m.  DVT prophylaxis:SCDs Code Status:Full Family Communication:Spoke with son on 10/12 Disposition Plan:Continue ongoing IV diuresis.  Plan for discharge to SNF.   Continue monitor for bleeding at site of thoracentesis.   Consultants:  None  Procedures:  Right-sided thoracentesis on 10/12  Antimicrobials:   None  Subjective: Patient seen and evaluated today with no new acute complaints or concerns. No acute concerns or events noted overnight.  She is noted to be somewhat somnolent this morning and continues to have some bleeding at her thoracentesis site.  Objective: Vitals:   02/08/19 1342 02/08/19 1401 02/08/19 2124 02/09/19 0515  BP: (!) 148/84 119/71 129/69 132/82  Pulse: 88 83 75 81  Resp: 18 16 20 20   Temp:  (!) 97.4 F (36.3 C) 97.7 F (36.5 C) 97.6 F (36.4 C)  TempSrc:  Oral Oral Oral  SpO2: 95% 93% 90% 98%  Weight:    91.6 kg  Height:        Intake/Output Summary (Last 24 hours) at 02/09/2019 1220 Last data filed at 02/09/2019 0625 Gross per 24 hour  Intake 240 ml  Output 1700 ml  Net -1460 ml   Filed Weights   02/07/19 0433 02/08/19 0607 02/09/19 0515  Weight: 94.8 kg 98.7 kg 91.6 kg    Examination:  General exam: Appears calm and comfortable  Respiratory system: Clear to auscultation. Respiratory effort normal.  Currently on nasal cannula oxygen. Cardiovascular system: S1 & S2 heard, RRR. No JVD, murmurs, rubs, gallops or clicks. No pedal edema.  Right-sided thoracentesis dressing with some bloody drainage noted Gastrointestinal system: Abdomen is nondistended, soft and nontender. No organomegaly or masses felt. Normal bowel sounds heard. Central nervous system: Somnolent but arousable Extremities: Symmetric 5 x 5 power. Skin: No rashes, lesions or ulcers Psychiatry: Difficult to assess today.    Data Reviewed: I have personally reviewed following labs and imaging studies  CBC: Recent Labs  Lab 02/05/19 2154 02/06/19 1233 02/07/19 1329 02/09/19 0525  WBC 4.4 4.0 4.5 5.1  NEUTROABS 3.5  --   --   --   HGB 11.2* 10.6* 10.2* 10.1*  HCT 36.0 33.5* 32.3* 31.5*  MCV 86.1 85.7 85.4 84.0  PLT  118* 115* 111* [id number]*   Basic Metabolic Panel: Recent Labs  Lab 02/05/19 2154 02/06/19 0624 02/07/19 1329 02/08/19 0500 02/09/19 0525  NA 141 141 145 145 145  K 3.3* 4.9 3.1* 3.1* 3.3*  CL 106 109 108 105 105  CO2 23 25 27 27 28   GLUCOSE 167* 140* 108* 94 128*  BUN 23 24* 22 23 23   CREATININE 1.53* 1.63* 1.62* 1.67* 1.59*  CALCIUM 8.9 8.5* 9.0 9.3 8.9  MG  --   --   --  1.9 1.8   GFR: Estimated Creatinine Clearance: 27.5 mL/min (A) (by C-G formula based on SCr of 1.59 mg/dL (H)). Liver Function Tests: Recent Labs  Lab 02/05/19 2154 02/06/19 0624  AST 26 32  ALT 18 21  ALKPHOS 168* 146*  BILITOT 1.2 1.8*  PROT 7.6 7.0  ALBUMIN 3.1* 2.8*   No results for input(s): LIPASE, AMYLASE in the last 168 hours. Recent Labs  Lab 02/07/19 1329 02/08/19 1413  AMMONIA 17 17   Coagulation Profile: Recent Labs  Lab 02/08/19 1107  INR 1.3*   Cardiac Enzymes: No results for input(s): CKTOTAL, CKMB, CKMBINDEX, TROPONINI in the last 168 hours. BNP (last 3 results) No results for input(s): PROBNP in the last 8760 hours. HbA1C: No results for input(s): HGBA1C in the last 72 hours.  CBG: Recent Labs  Lab 02/08/19 0720 02/08/19 1601 02/08/19 2123 02/09/19 0806 02/09/19 1148  GLUCAP 108* 105* 143* 112* 130*   Lipid Profile: No results for input(s): CHOL, HDL, LDLCALC, TRIG, CHOLHDL, LDLDIRECT in the last 72 hours. Thyroid Function Tests: Recent Labs    02/08/19 1413  FREET4 1.31*   Anemia Panel: No results for input(s): VITAMINB12, FOLATE, FERRITIN, TIBC, IRON, RETICCTPCT in the last 72 hours. Sepsis Labs: No results for input(s): PROCALCITON, LATICACIDVEN in the last 168 hours.  Recent Results (from the past 240 hour(s))  SARS CORONAVIRUS 2 (TAT 6-24 HRS) Nasopharyngeal Nasopharyngeal Swab     Status: None   Collection Time: 02/05/19 11:49 PM   Specimen: Nasopharyngeal Swab  Result Value Ref Range Status   SARS Coronavirus 2 NEGATIVE NEGATIVE Final    Comment:  (NOTE) SARS-CoV-2 target nucleic acids are NOT DETECTED. The SARS-CoV-2 RNA is generally detectable in upper and lower respiratory specimens during the acute phase of infection. Negative results do not preclude SARS-CoV-2 infection, do not rule out co-infections with other pathogens, and should not be used as the sole basis for treatment or other patient management decisions. Negative results must be combined with clinical observations, patient history, and epidemiological information. The expected result is Negative. Fact Sheet for Patients: SugarRoll.be Fact Sheet for Healthcare Providers: https://www.woods-mathews.com/ This test is not yet approved or cleared by the [place] FDA and  has been authorized for detection and/or diagnosis of SARS-CoV-2 by FDA under an Emergency Use Authorization (EUA). This EUA will remain  in effect (meaning this test can be used) for the duration of the COVID-19 declaration under Section 56 4(b)(1) of the Act, 21 U.S.C. section 360bbb-3(b)(1), unless the authorization is terminated or revoked sooner. Performed at [facility], [place] [address] [place], [place] [address]   Gram stain     Status: None   Collection Time: 02/08/19 10:33 AM   Specimen: Lung, Right; Pleural Fluid  Result Value Ref Range Status   Specimen Description PERITONEAL  Final   Special Requests NONE  Final   Gram Stain   Final    NO ORGANISMS SEEN CYTOSPIN SMEAR WBC PRESENT,BOTH PMN AND MONONUCLEAR Performed at [facility], [address] [place], [place] [address]    Report Status 02/08/2019 FINAL  Final  Culture, body fluid-bottle     Status: None (Preliminary result)   Collection Time: 02/08/19 10:34 AM   Specimen: Ascitic  Result Value Ref Range Status   Specimen Description ASCITIC  Final   Special Requests BOTTLES DRAWN AEROBIC AND ANAEROBIC 10CC  Final   Culture   Final    NO GROWTH < 24 HOURS Performed at [facility], [address] [place], [place] [address]    Report Status PENDING  Incomplete         Radiology Studies: Dg Chest 1 View  Result Date: 02/08/2019 CLINICAL DATA:  84 year old female with history of right-sided pleural effusion status post thoracentesis. EXAM: CHEST  1 VIEW COMPARISON:  Chest x-ray 02/05/2019. FINDINGS: Compared to the prior study, the previously noted right-sided pleural effusion has decreased in size, now moderate to small. No definite pneumothorax. Lung volumes remain low. Extensive bibasilar opacities may reflect areas of atelectasis and/or consolidation, with superimposed pleural effusions (small to moderate on the left). Pulmonary vasculature crowding, without frank pulmonary edema. Cardiac silhouette is obscured. Upper mediastinal contours are distorted by patient's rotation to the right. New right upper extremity PICC with tip terminating in the distal superior  vena cava. Orthopedic fixation hardware in the lower cervical spine incidentally noted. IMPRESSION: 1. Decreased size of right-sided pleural effusion following thoracentesis. No appreciable pneumothorax or other acute complicating features. 2. Low lung volumes with bibasilar opacities which may reflect areas of atelectasis and/or consolidation, with superimposed bilateral pleural effusions. 3. Support apparatus and postoperative changes, as above. Electronically Signed   By: Vinnie Langton M.D.   On: 02/08/2019 13:41   Nm Pulmonary Perf And Vent  Result Date: 02/08/2019 CLINICAL DATA:  Positive D-dimer.  PE suspected. EXAM: NUCLEAR MEDICINE VENTILATION - PERFUSION LUNG SCAN TECHNIQUE: Ventilation images were obtained in multiple projections using inhaled aerosol Tc-53m DTPA. Perfusion images were obtained in multiple projections after intravenous injection of Tc-29m MAA. RADIOPHARMACEUTICALS:  1.4 mCi of Tc-6m DTPA aerosol inhalation and 32.4 mCi Tc47m MAA IV COMPARISON:  Chest x-ray from same day.  Chest  CT 02/06/2019 FINDINGS: Ventilation: No mismatched ventilation defect. Perfusion: Asymmetric decreased perfusion at the left base compatible with asymmetric elevation of the left hemidiaphragm. Decreased perfusion in the lower lungs consistent with bibasilar collapse/consolidation and effusions seen on chest x-ray from today and chest CT from 2 days ago. IMPRESSION: No ventilation perfusion mismatch. Low probability for pulmonary embolus. Electronically Signed   By: Misty Stanley M.D.   On: 02/08/2019 14:40   US Thoracentesis Asp Pleural Space W/img Guide  Result Date: 02/08/2019 INDICATION: 84 year old female with history of right-sided pleural effusion. EXAM: ULTRASOUND GUIDED RIGHT THORACENTESIS MEDICATIONS: None. COMPLICATIONS: None immediate. PROCEDURE: An ultrasound guided thoracentesis was thoroughly discussed with the patient and questions answered. The benefits, risks, alternatives and complications were also discussed. The patient understands and wishes to proceed with the procedure. Written consent was obtained. Ultrasound was performed to localize and mark an adequate pocket of fluid in the right chest. The area was then prepped and draped in the normal sterile fashion. 1% Lidocaine was used for local anesthesia. Under ultrasound guidance a 5 [place] Yueh centesis catheter was introduced. Thoracentesis was performed. The catheter was removed and a dressing applied. FINDINGS: A total of approximately 800 mL of serous fluid was removed. Samples were sent to the laboratory as requested by the clinical team. IMPRESSION: Successful ultrasound guided right-sided thoracentesis yielding 800 mL of pleural fluid. Electronically Signed   By: Vinnie Langton M.D.   On: 02/08/2019 14:02        Scheduled Meds: . aspirin  81 mg Oral Daily  . atorvastatin  40 mg Oral q1800  . Chlorhexidine Gluconate Cloth  6 each Topical Daily  . furosemide  40 mg Intravenous Q12H  . hydrALAZINE  25 mg Oral Q8H  .  insulin aspart  0-5 Units Subcutaneous QHS  . insulin aspart  0-9 Units Subcutaneous TID WC  . irbesartan  300 mg Oral Daily  . potassium chloride SA  40 mEq Oral Once  . potassium chloride  40 mEq Oral BID   Continuous Infusions:   LOS: 2 days    Time spent: 30 minutes    Kaliann Coryell Darleen Crocker, DO [facility] Pager [phone]  If 7PM-7AM, please contact night-coverage [email or web address] Password [email or web address] 02/09/2019, 12:20 PM

## 2019-02-09 NOTE — TOC Initial Note (Signed)
Transition of Care [facility]) - Initial/Assessment Note    Patient Details  Name: Lindsey Mcguire MRN: [id number] Date of Birth: 25-Jul-1934  [facility] CM/SW Contact:    Boneta Lucks, RN Phone Number: 02/09/2019, 4:51 PM  Clinical Narrative:   Patient admitted for CHF. PT is recommending SNF. Patient is refusing, wanting to go home. CM called [name]- husband. [name] called his son and sister to discuss, calling CM back requesting Home health.  Stating they can manage her at home.  Choices given, no preference.  Call [facility] with [facility], she accepted the referral for HHPT.   TOC to follow.               Expected Discharge Plan: [place] Barriers to Discharge: Continued Medical Work up   Patient Goals and CMS Choice Patient states their goals for this hospitalization and ongoing recovery are:: to go home with home health. CMS Medicare.gov Compare Post Acute Care list provided to:: Patient Represenative (must comment)(son) Choice offered to / list presented to : Adult Children  Expected Discharge Plan and Services Expected Discharge Plan: [place] Choice: [place] arrangements for the past 2 months: [place]: PT [place] Agency: [place] ([place]) Date [place]: 02/09/19 Time [place]: [id number] Representative spoke with at [place]: [place] Arrangements/Services Living arrangements for the past 2 months: [place] with:: Spouse   Do you feel safe going back to the place where you live?: Yes      Need for Family Participation in Patient Care: Yes (Comment) Care giver support system in place?: Yes (comment)   Criminal Activity/Legal Involvement Pertinent to Current Situation/Hospitalization: No - Comment as needed  Activities of Daily Living Home Assistive Devices/Equipment: Cane (specify  quad or straight), Walker (specify type) ADL Screening (condition at time of admission) Patient's cognitive ability adequate to safely complete daily activities?: No Is the patient deaf or have difficulty hearing?: Yes Does the patient have difficulty seeing, even when wearing glasses/contacts?: No Does the patient have difficulty concentrating, remembering, or making decisions?: Yes Patient able to express need for assistance with ADLs?: Yes Does the patient have difficulty dressing or bathing?: Yes Independently performs ADLs?: No Communication: Independent Dressing (OT): Needs assistance Is this a change from baseline?: Pre-admission baseline Grooming: Needs assistance Is this a change from baseline?: Pre-admission baseline Feeding: Needs assistance Is this a change from baseline?: Pre-admission baseline Bathing: Needs assistance Is this a change from baseline?: Pre-admission baseline Toileting: Needs assistance Is this a change from baseline?: Pre-admission baseline In/Out Bed: Needs assistance Is this a change from baseline?: Pre-admission baseline Walks in Home: Needs assistance Is this a change from baseline?: Pre-admission baseline Does the patient have difficulty walking or climbing stairs?: Yes Weakness of Legs: Both Weakness of Arms/Hands: Both  Permission Sought/Granted   Permission granted to share information with : Yes, Verbal Permission Granted  Share Information with NAME: [name]     Permission granted to share info w Relationship: husband     Emotional Assessment     Affect (typically observed): Accepting   Alcohol / Substance Use: Not Applicable Psych Involvement: No (comment)  Admission diagnosis:  Chronic kidney disease, unspecified CKD stage [N18.9] Congestive heart failure, unspecified HF chronicity, unspecified  heart failure type [facility]) [I50.9] Patient Active Problem List   Diagnosis Date Noted  . Acute CHF (congestive heart failure) ([place])  02/07/2019  . CHF (congestive heart failure) ([place]) 02/06/2019  . ICH (intracerebral hemorrhage) ([place]) 02/23/2017  . Intracranial hemorrhage ([place]) 02/23/2017  . Type 2 diabetes mellitus with stage 4 chronic kidney disease, without long-term current use of insulin ([place]) 09/24/2016  . Anemia due to chronic renal failure treated with erythropoietin 02/16/2011   PCP:  Leeroy Cha, MD Pharmacy:   [facility], [place] [address] Phone: [phone] Fax: [phone]   Readmission Risk Interventions No flowsheet data found.

## 2019-02-09 NOTE — Progress Notes (Signed)
Physical Therapy Treatment Patient Details Name: Lindsey Mcguire MRN: [id number] DOB: 12-Feb-1935 Today's Date: 02/09/2019    History of Present Illness Lindsey Mcguire  is a 84 y.o. female, w hypertension, Dm2, Anemia, presents due to edema.  Pt is sleepy and unwilling to provide history.    PT Comments    Patient requires min assist to transition from supine to sit and moderate assist for sit to stand and stand pivot transfer to chair. She requires verbal and tactile cueing for UE placement and sequencing but has limited carry over into task being performed. Patient's SpO2 82% upon transitioning to seated which increased to 91% after several minutes on 2 LPM O2, after transition to standing--> sitting SpO2 decreased to 75% which increased to 91% after several minutes on 2 LPM O2. Patient unable to ambulate secondary to fatigue and decreased O2 saturation with activity. Patient left in chair - RN notified about O2 changes and mobility status. Patient will benefit from continued physical therapy in hospital and recommended venue below to increase strength, balance, endurance for safe ADLs and gait.     Follow Up Recommendations  SNF     Equipment Recommendations  None recommended by PT    Recommendations for Other Services       Precautions / Restrictions Precautions Precautions: Fall Restrictions Weight Bearing Restrictions: No    Mobility  Bed Mobility Overal bed mobility: Needs Assistance Bed Mobility: Supine to Sit     Supine to sit: Min assist     General bed mobility comments: slow labored movement, SOB, SpO2 82% upon transitioning to seated which increased to 91% after several minutes on 2 LPM O2  Transfers Overall transfer level: Needs assistance Equipment used: Rolling walker (2 wheeled) Transfers: Sit to/from [facility] Sit to Stand: Mod assist Stand pivot transfers: Mod assist       General transfer comment: required repeated  verbal/tactile cueing for proper hand placement and LE use, limited carry over with instruction, patient felt weak/nauseous upon standing; after transition to standing--> sitting SpO2 decreased to 75% which increased to 91% after several minutes on 2 LPM O2  Ambulation/Gait                 Stairs             Wheelchair Mobility    Modified Rankin (Stroke Patients Only)       Balance Overall balance assessment: Needs assistance Sitting-balance support: Feet supported;No upper extremity supported Sitting balance-Leahy Scale: Fair Sitting balance - Comments: fair/good seated at bedside   Standing balance support: Bilateral upper extremity supported;During functional activity Standing balance-Leahy Scale: Fair Standing balance comment: using RW                            Cognition Arousal/Alertness: Awake/alert Behavior During Therapy: WFL for tasks assessed/performed Overall Cognitive Status: Within Functional Limits for tasks assessed                                 General Comments: required repeated verbal cueing due to [facility]      Exercises      General Comments        Pertinent Vitals/Pain Pain Assessment: No/denies pain    Home Living                      Prior Function  PT Goals (current goals can now be found in the care plan section) Acute Rehab PT Goals Patient Stated Goal: return home PT Goal Formulation: With patient Time For Goal Achievement: 02/22/19 Potential to Achieve Goals: Good Progress towards PT goals: Progressing toward goals    Frequency    Min 3X/week      PT Plan Current plan remains appropriate    Co-evaluation              AM-PAC PT "6 Clicks" Mobility   Outcome Measure  Help needed turning from your back to your side while in a flat bed without using bedrails?: A Little Help needed moving from lying on your back to sitting on the side of a flat bed without  using bedrails?: A Little Help needed moving to and from a bed to a chair (including a wheelchair)?: A Lot Help needed standing up from a chair using your arms (e.g., wheelchair or bedside chair)?: A Lot Help needed to walk in hospital room?: A Lot Help needed climbing 3-5 steps with a railing? : A Lot 6 Click Score: 14    End of Session Equipment Utilized During Treatment: Oxygen Activity Tolerance: Patient tolerated treatment well;Patient limited by fatigue Patient left: in chair;with call bell/phone within reach;with chair alarm set Nurse Communication: Mobility status PT Visit Diagnosis: Unsteadiness on feet (R26.81);Other abnormalities of gait and mobility (R26.89);Muscle weakness (generalized) (M62.81)     Time: [id number] PT Time Calculation (min) (ACUTE ONLY): 17 min  Charges:  $Therapeutic Activity: 8-22 mins                     12:33 PM, 02/09/19 Mearl Latin PT, DPT Physical Therapist at [facility]

## 2019-02-09 NOTE — Progress Notes (Signed)
Dressing changed and bleeding has slowed down to where dressing not saturated but still bloody drainage.  Replaced with half pack 4x4;s and abd and pressure tape.

## 2019-02-09 NOTE — Progress Notes (Signed)
Foam dressing to patient's right upper back saturated with blood. Dressing removed and site oozing from Thoracentesis 10/12. Pressure dressing applied with 4x4's and ABD pad. Will continue to monitor.

## 2019-02-09 NOTE — Progress Notes (Signed)
Dressing saturated and changed at 0900 and 1200.  Package of 4x4's and abd pad with pressure tape each time.  Dr. Manuella Ghazi aware and he made radiologist aware and directed to keep applying pressure dressing and monitor.

## 2019-02-10 LAB — PROTIME-INR
INR: 1.4 — ABNORMAL HIGH (ref 0.8–1.2)
Prothrombin Time: 16.7 seconds — ABNORMAL HIGH (ref 11.4–15.2)

## 2019-02-10 LAB — CBC
HCT: 29.6 % — ABNORMAL LOW (ref 36.0–46.0)
Hemoglobin: 9.5 g/dL — ABNORMAL LOW (ref 12.0–15.0)
MCH: 27.1 pg (ref 26.0–34.0)
MCHC: 32.1 g/dL (ref 30.0–36.0)
MCV: 84.3 fL (ref 80.0–100.0)
Platelets: 98 10*3/uL — ABNORMAL LOW (ref 150–400)
RBC: 3.51 MIL/uL — ABNORMAL LOW (ref 3.87–5.11)
RDW: 19.8 % — ABNORMAL HIGH (ref 11.5–15.5)
WBC: 5.5 10*3/uL (ref 4.0–10.5)
nRBC: 0.9 % — ABNORMAL HIGH (ref 0.0–0.2)

## 2019-02-10 LAB — GLUCOSE, CAPILLARY
Glucose-Capillary: 112 mg/dL — ABNORMAL HIGH (ref 70–99)
Glucose-Capillary: 108 mg/dL — ABNORMAL HIGH (ref 70–99)
Glucose-Capillary: 88 mg/dL (ref 70–99)
Glucose-Capillary: 111 mg/dL — ABNORMAL HIGH (ref 70–99)

## 2019-02-10 LAB — BASIC METABOLIC PANEL
Anion gap: 12 (ref 5–15)
BUN: 24 mg/dL — ABNORMAL HIGH (ref 8–23)
CO2: 27 mmol/L (ref 22–32)
Calcium: 9 mg/dL (ref 8.9–10.3)
Chloride: 108 mmol/L (ref 98–111)
Creatinine, Ser: 1.62 mg/dL — ABNORMAL HIGH (ref 0.44–1.00)
GFR calc Af Amer: 34 mL/min — ABNORMAL LOW (ref >60)
GFR calc non Af Amer: 29 mL/min — ABNORMAL LOW (ref >60)
Glucose, Bld: 123 mg/dL — ABNORMAL HIGH (ref 70–99)
Potassium: 4.1 mmol/L (ref 3.5–5.1)
Sodium: 147 mmol/L — ABNORMAL HIGH (ref 135–145)

## 2019-02-10 LAB — MAGNESIUM: Magnesium: 1.8 mg/dL (ref 1.7–2.4)

## 2019-02-10 MED ORDER — POTASSIUM CHLORIDE CRYS ER 20 MEQ PO TBCR
40.0000 meq | EXTENDED_RELEASE_TABLET | Freq: Every day | ORAL | Status: DC
  Administered 2019-02-11 – 2019-02-12 (×2): 40 meq via ORAL
  Filled 2019-02-10 (×2): qty 2

## 2019-02-10 MED ORDER — FUROSEMIDE 10 MG/ML IJ SOLN
40.0000 mg | Freq: Three times a day (TID) | INTRAMUSCULAR | Status: DC
  Administered 2019-02-10 – 2019-02-11 (×2): 40 mg via INTRAVENOUS
  Filled 2019-02-10 (×2): qty 4

## 2019-02-10 NOTE — Progress Notes (Signed)
PROGRESS NOTE    Lindsey Mcguire  [id number] DOB: June 10, 1934 DOA: 02/05/2019 PCP: Lindsey Cha, MD   Brief Narrative:  Per HPI: Lindsey Mcguire a84 y.o.female,w hypertension, Dm2, Anemia, presents due to edema. Pt is sleepy and unwilling to provide history.  Patient was admitted with symptoms of dyspnea and edema likely secondary to acute on chronic diastolic CHF. Prior 2D echocardiogram on 10/2018 with LVEF 60-65% noted. Further history obtained from son and his fiance regarding her unwillingness to come to the hospital over the last 2 months despite worsening shortness of breath and volume overload. Over the last 2 weeks it appears that she has been more altered as well and sleepy at home. So there also appears to be a component of encephalopathy present that needs further evaluation. Toxicology with no findings at this moment and urine analysis does not demonstrate any signs of UTI. We will obtain ammonia levels as well as TSH to further evaluate. ABG does not demonstrate any significant hypercapnia. CT of the head with no acute findings.  10/11:Patient continues to diurese quite well and is negative almost 1 L fluid balance. Her mentation appears to be improved this morning, however labs are pending as she is a difficult IV access. She will require PICC line which has been ordered. VQ scan still pending. PT evaluation pending.  10/12:Patient continues to diurese well with close to 2 L net negative fluid balance noted overnight. She appears to be improving steadily. Will reorder ammonia and free T4 levels as these appear not to have been collected. VQ scan still pending, but low suspicion for PE. PICC line ordered and placed. PT evaluation ordered. Patient for thoracentesis today due to moderate to large fluid collection to right pleural cavity.  10/13: Patient continues to diurese well.  Ammonia level is within normal limits and T4 levels are  elevated.  VQ scan with low probability for PE noted.  PT evaluation reveals need for SNF.  Thoracentesis performed on 10/12 with 800 cc of fluid removed and patient is struggling with some bleeding from the site at the moment.  10/14: Patient continues to diurese, but appears quite lethargic still.  No further bleeding from thoracentesis site noted.  PT reevaluation reveals need for SNF as she is max assist.  Family members would like to take her home with home health physical therapy, but will decide by tomorrow if this is still the case.  Assessment & Plan:   Principal Problem:   CHF (congestive heart failure) (HCC) Active Problems:   Anemia due to chronic renal failure treated with erythropoietin   Type 2 diabetes mellitus with stage 4 chronic kidney disease, without long-term current use of insulin (HCC)   Acute CHF (congestive heart failure) (HCC)   Dyspnea secondary to acute on chronic diastolic CHF exacerbation-improving -Recent 2D echocardiogram with LVEF 60-65% noted and no need for reevaluation currently -Continue on IV Lasix now to 3 times daily for more aggressive diuresis -Monitor I's and O's and daily weights -Monitor labs once PICC line placed -Continue telemetry  Moderate to large right-sided pleural effusion -Status post thoracentesis of 800 cc of fluid 10/12 -Monitor ongoing bleeding and will add pressure dressings  Chronic encephalopathy-with lethargy -Possibly related to hypothyroidism with elevated TSH of 14.48 noted -T4 levels are noted to be 1.31 -Ammonia levels are normal  Elevated d-dimer -VQ scan with low probability for PE noted  Hypertension-improved control -Continue onirbesartan -DCamlodipine due to edema -Continue hydralazine 25 mg p.o. every 8 hours and monitor control  Type 2 diabetes -Blood glucose controlled -Continue sliding scale insulin -We will hold Actos on discharge and consider Farxiga  CKD stage III -Continue to monitor  with ongoing diuresis -Repeat labs pending  Anemia/thrombocytopenia -Currently stable will monitor with repeat labs -We will continue to monitor along with INR in the a.m.   DVT prophylaxis:SCDs Code Status:Full Family Communication:Spoke with son on 10/14 Disposition Plan:Continue ongoing IV diuresis.  Plan for discharge to SNF hopefully by end of the week.  Family members to reconsider decision about home health physical therapy.   Consultants:  None  Procedures:  Right-sided thoracentesis on 10/12  Antimicrobials:   None  Subjective: Patient seen and evaluated today with no new acute complaints or concerns. No acute concerns or events noted overnight.  She still appears quite somnolent and lethargic this morning.  Objective: Vitals:   02/09/19 2133 02/09/19 2258 02/10/19 0509 02/10/19 0730  BP: (!) 91/52 112/65 131/83 135/85  Pulse: 100 80 84 83  Resp: 18 (!) 21 20 (!) 21  Temp: (!) 97.4 F (36.3 C)  97.8 F (36.6 C) 97.8 F (36.6 C)  TempSrc: Oral  Oral Oral  SpO2: 97% 90% 93% 100%  Weight:   92 kg   Height:        Intake/Output Summary (Last 24 hours) at 02/10/2019 1312 Last data filed at 02/10/2019 0640 Gross per 24 hour  Intake 120 ml  Output 1100 ml  Net -980 ml   Filed Weights   02/08/19 0607 02/09/19 0515 02/10/19 0509  Weight: 98.7 kg 91.6 kg 92 kg    Examination:  General exam: Appears somnolent Respiratory system: Clear to auscultation. Respiratory effort normal.  On 2 L nasal cannula oxygen. Cardiovascular system: S1 & S2 heard, RRR. No JVD, murmurs, rubs, gallops or clicks. No pedal edema. Gastrointestinal system: Abdomen is nondistended, soft and nontender. No organomegaly or masses felt. Normal bowel sounds heard. Central nervous system: Somnolent Extremities: Symmetric 5 x 5 power. Skin: No rashes, lesions or ulcers Psychiatry: Difficult to assess.    Data Reviewed: I have personally reviewed following labs and  imaging studies  CBC: Recent Labs  Lab 02/05/19 2154 02/06/19 1233 02/07/19 1329 02/09/19 0525 02/10/19 0504  WBC 4.4 4.0 4.5 5.1 5.5  NEUTROABS 3.5  --   --   --   --   HGB 11.2* 10.6* 10.2* 10.1* 9.5*  HCT 36.0 33.5* 32.3* 31.5* 29.6*  MCV 86.1 85.7 85.4 84.0 84.3  PLT 118* 115* 111* 112* 98*   Basic Metabolic Panel: Recent Labs  Lab 02/06/19 0624 02/07/19 1329 02/08/19 0500 02/09/19 0525 02/10/19 0504  NA 141 145 145 145 147*  K 4.9 3.1* 3.1* 3.3* 4.1  CL 109 108 105 105 108  CO2 25 27 27 28 27   GLUCOSE 140* 108* 94 128* 123*  BUN 24* 22 23 23  24*  CREATININE 1.63* 1.62* 1.67* 1.59* 1.62*  CALCIUM 8.5* 9.0 9.3 8.9 9.0  MG  --   --  1.9 1.8 1.8   GFR: Estimated Creatinine Clearance: 27.1 mL/min (A) (by C-G formula based on SCr of 1.62 mg/dL (H)). Liver Function Tests: Recent Labs  Lab 02/05/19 2154 02/06/19 0624  AST 26 32  ALT 18 21  ALKPHOS 168* 146*  BILITOT 1.2 1.8*  PROT 7.6 7.0  ALBUMIN 3.1* 2.8*   No results for input(s): LIPASE, AMYLASE in the last 168 hours. Recent Labs  Lab 02/07/19 1329 02/08/19 1413  AMMONIA 17 17   Coagulation Profile: Recent Labs  Lab 02/08/19 1107 02/10/19 0504  INR 1.3* 1.4*   Cardiac Enzymes: No results for input(s): CKTOTAL, CKMB, CKMBINDEX, TROPONINI in the last 168 hours. BNP (last 3 results) No results for input(s): PROBNP in the last 8760 hours. HbA1C: No results for input(s): HGBA1C in the last 72 hours. CBG: Recent Labs  Lab 02/09/19 1148 02/09/19 1631 02/09/19 2140 02/10/19 0725 02/10/19 1130  GLUCAP 130* 133* 120* 112* 108*   Lipid Profile: No results for input(s): CHOL, HDL, LDLCALC, TRIG, CHOLHDL, LDLDIRECT in the last 72 hours. Thyroid Function Tests: Recent Labs    02/08/19 1413  FREET4 1.31*   Anemia Panel: No results for input(s): VITAMINB12, FOLATE, FERRITIN, TIBC, IRON, RETICCTPCT in the last 72 hours. Sepsis Labs: No results for input(s): PROCALCITON, LATICACIDVEN in the  last 168 hours.  Recent Results (from the past 240 hour(s))  SARS CORONAVIRUS 2 (TAT 6-24 HRS) Nasopharyngeal Nasopharyngeal Swab     Status: None   Collection Time: 02/05/19 11:49 PM   Specimen: Nasopharyngeal Swab  Result Value Ref Range Status   SARS Coronavirus 2 NEGATIVE NEGATIVE Final    Comment: (NOTE) SARS-CoV-2 target nucleic acids are NOT DETECTED. The SARS-CoV-2 RNA is generally detectable in upper and lower respiratory specimens during the acute phase of infection. Negative results do not preclude SARS-CoV-2 infection, do not rule out co-infections with other pathogens, and should not be used as the sole basis for treatment or other patient management decisions. Negative results must be combined with clinical observations, patient history, and epidemiological information. The expected result is Negative. Fact Sheet for Patients: SugarRoll.be Fact Sheet for Healthcare Providers: https://www.woods-mathews.com/ This test is not yet approved or cleared by the [place] FDA and  has been authorized for detection and/or diagnosis of SARS-CoV-2 by FDA under an Emergency Use Authorization (EUA). This EUA will remain  in effect (meaning this test can be used) for the duration of the COVID-19 declaration under Section 56 4(b)(1) of the Act, 21 U.S.C. section 360bbb-3(b)(1), unless the authorization is terminated or revoked sooner. Performed at [facility], [place] [address] [place], [place] [address]   Gram stain     Status: None   Collection Time: 02/08/19 10:33 AM   Specimen: Lung, Right; Pleural Fluid  Result Value Ref Range Status   Specimen Description PERITONEAL  Final   Special Requests NONE  Final   Gram Stain   Final    NO ORGANISMS SEEN CYTOSPIN SMEAR WBC PRESENT,BOTH PMN AND MONONUCLEAR Performed at [facility], [address], [place], [place] [address]    Report Status 02/08/2019 FINAL  Final  Culture, body  fluid-bottle     Status: None (Preliminary result)   Collection Time: 02/08/19 10:34 AM   Specimen: Ascitic  Result Value Ref Range Status   Specimen Description ASCITIC  Final   Special Requests BOTTLES DRAWN AEROBIC AND ANAEROBIC 10CC  Final   Culture   Final    NO GROWTH 2 DAYS Performed at [facility], [address], [place], [place] [address]    Report Status PENDING  Incomplete         Radiology Studies: Dg Chest 1 View  Result Date: 02/08/2019 CLINICAL DATA:  84 year old female with history of right-sided pleural effusion status post thoracentesis. EXAM: CHEST  1 VIEW COMPARISON:  Chest x-ray 02/05/2019. FINDINGS: Compared to the prior study, the previously noted right-sided pleural effusion has decreased in size, now moderate to small. No definite pneumothorax. Lung volumes remain low. Extensive bibasilar opacities may  reflect areas of atelectasis and/or consolidation, with superimposed pleural effusions (small to moderate on the left). Pulmonary vasculature crowding, without frank pulmonary edema. Cardiac silhouette is obscured. Upper mediastinal contours are distorted by patient's rotation to the right. New right upper extremity PICC with tip terminating in the distal superior vena cava. Orthopedic fixation hardware in the lower cervical spine incidentally noted. IMPRESSION: 1. Decreased size of right-sided pleural effusion following thoracentesis. No appreciable pneumothorax or other acute complicating features. 2. Low lung volumes with bibasilar opacities which may reflect areas of atelectasis and/or consolidation, with superimposed bilateral pleural effusions. 3. Support apparatus and postoperative changes, as above. Electronically Signed   By: Vinnie Langton M.D.   On: 02/08/2019 13:41   Nm Pulmonary Perf And Vent  Result Date: 02/08/2019 CLINICAL DATA:  Positive D-dimer.  PE suspected. EXAM: NUCLEAR MEDICINE VENTILATION - PERFUSION LUNG SCAN TECHNIQUE: Ventilation images  were obtained in multiple projections using inhaled aerosol Tc-84m DTPA. Perfusion images were obtained in multiple projections after intravenous injection of Tc-64m MAA. RADIOPHARMACEUTICALS:  1.4 mCi of Tc-69m DTPA aerosol inhalation and 32.4 mCi Tc79m MAA IV COMPARISON:  Chest x-ray from same day.  Chest CT 02/06/2019 FINDINGS: Ventilation: No mismatched ventilation defect. Perfusion: Asymmetric decreased perfusion at the left base compatible with asymmetric elevation of the left hemidiaphragm. Decreased perfusion in the lower lungs consistent with bibasilar collapse/consolidation and effusions seen on chest x-ray from today and chest CT from 2 days ago. IMPRESSION: No ventilation perfusion mismatch. Low probability for pulmonary embolus. Electronically Signed   By: Misty Stanley M.D.   On: 02/08/2019 14:40   US Thoracentesis Asp Pleural Space W/img Guide  Result Date: 02/08/2019 INDICATION: 84 year old female with history of right-sided pleural effusion. EXAM: ULTRASOUND GUIDED RIGHT THORACENTESIS MEDICATIONS: None. COMPLICATIONS: None immediate. PROCEDURE: An ultrasound guided thoracentesis was thoroughly discussed with the patient and questions answered. The benefits, risks, alternatives and complications were also discussed. The patient understands and wishes to proceed with the procedure. Written consent was obtained. Ultrasound was performed to localize and mark an adequate pocket of fluid in the right chest. The area was then prepped and draped in the normal sterile fashion. 1% Lidocaine was used for local anesthesia. Under ultrasound guidance a 5 [place] Yueh centesis catheter was introduced. Thoracentesis was performed. The catheter was removed and a dressing applied. FINDINGS: A total of approximately 800 mL of serous fluid was removed. Samples were sent to the laboratory as requested by the clinical team. IMPRESSION: Successful ultrasound guided right-sided thoracentesis yielding 800 mL of pleural  fluid. Electronically Signed   By: Vinnie Langton M.D.   On: 02/08/2019 14:02        Scheduled Meds: . aspirin  81 mg Oral Daily  . atorvastatin  40 mg Oral q1800  . Chlorhexidine Gluconate Cloth  6 each Topical Daily  . furosemide  40 mg Intravenous Q8H  . hydrALAZINE  25 mg Oral Q8H  . insulin aspart  0-5 Units Subcutaneous QHS  . insulin aspart  0-9 Units Subcutaneous TID WC  . irbesartan  300 mg Oral Daily  . potassium chloride SA  40 mEq Oral Once  . [START ON 02/11/2019] potassium chloride  40 mEq Oral Daily   Continuous Infusions:   LOS: 3 days    Time spent: 30 minutes    Vittoria Noreen Darleen Crocker, DO [facility] Pager [phone]  If 7PM-7AM, please contact night-coverage [email or web address] Password [email or web address] 02/10/2019, 1:12 PM

## 2019-02-10 NOTE — Progress Notes (Signed)
Physical Therapy Treatment Patient Details Name: Lindsey Mcguire MRN: [id number] DOB: 05-03-1934 Today's Date: 02/10/2019    History of Present Illness Lindsey Mcguire  is a 84 y.o. female, w hypertension, Dm2, Anemia, presents due to edema.  Pt is sleepy and unwilling to provide history.    PT Comments    Patient presents lethargic with poor participation with therapy.  Patient able to roll to side, but unable to sit up at bedside due to weakness and c/o fatigue.  Patient required Max assist to reposition in bed and SpO2 at 98% while on 2 LPM O2.  Patient will benefit from continued physical therapy in hospital and recommended venue below to increase strength, balance, endurance for safe ADLs and gait.    Follow Up Recommendations  SNF     Equipment Recommendations  None recommended by PT    Recommendations for Other Services       Precautions / Restrictions Precautions Precautions: Fall Restrictions Weight Bearing Restrictions: No    Mobility  Bed Mobility Overal bed mobility: Needs Assistance Bed Mobility: Rolling Rolling: Mod assist         General bed mobility comments: unable to sit up due to lethargy and generalized weakness  Transfers                    Ambulation/Gait                 Stairs             Wheelchair Mobility    Modified Rankin (Stroke Patients Only)       Balance                                            Cognition Arousal/Alertness: Lethargic Behavior During Therapy: Flat affect Overall Cognitive Status: Impaired/Different from baseline Area of Impairment: Awareness;Problem solving                             Problem Solving: Slow processing;Requires verbal cues;Requires tactile cues General Comments: lethargic, requires repeated verbal/tactile cueing with fair/poor carryover      Exercises      General Comments        Pertinent Vitals/Pain Pain Assessment:  No/denies pain    Home Living                      Prior Function            PT Goals (current goals can now be found in the care plan section) Acute Rehab PT Goals Patient Stated Goal: return home PT Goal Formulation: With patient Time For Goal Achievement: 02/22/19 Potential to Achieve Goals: Good Progress towards PT goals: Progressing toward goals    Frequency    Min 3X/week      PT Plan Current plan remains appropriate    Co-evaluation              AM-PAC PT "6 Clicks" Mobility   Outcome Measure  Help needed turning from your back to your side while in a flat bed without using bedrails?: A Lot Help needed moving from lying on your back to sitting on the side of a flat bed without using bedrails?: A Lot Help needed moving to and from a bed to a chair (including a wheelchair)?: A Lot Help  needed standing up from a chair using your arms (e.g., wheelchair or bedside chair)?: A Lot Help needed to walk in hospital room?: A Lot Help needed climbing 3-5 steps with a railing? : Total 6 Click Score: 11    End of Session Equipment Utilized During Treatment: Oxygen Activity Tolerance: Patient limited by fatigue;Patient limited by lethargy Patient left: in bed;with call bell/phone within reach;with bed alarm set Nurse Communication: Mobility status PT Visit Diagnosis: Unsteadiness on feet (R26.81);Other abnormalities of gait and mobility (R26.89);Muscle weakness (generalized) (M62.81)     Time: [id number] PT Time Calculation (min) (ACUTE ONLY): 14 min  Charges:  $Therapeutic Exercise: 8-22 mins                     11:54 AM, 02/10/19 Lonell Grandchild, MPT Physical Therapist with [facility] [phone] office [phone] mobile phone

## 2019-02-11 LAB — BASIC METABOLIC PANEL
Anion gap: 11 (ref 5–15)
BUN: 23 mg/dL (ref 8–23)
CO2: 29 mmol/L (ref 22–32)
Calcium: 9.1 mg/dL (ref 8.9–10.3)
Chloride: 107 mmol/L (ref 98–111)
Creatinine, Ser: 1.71 mg/dL — ABNORMAL HIGH (ref 0.44–1.00)
GFR calc Af Amer: 32 mL/min — ABNORMAL LOW (ref >60)
GFR calc non Af Amer: 27 mL/min — ABNORMAL LOW (ref >60)
Glucose, Bld: 101 mg/dL — ABNORMAL HIGH (ref 70–99)
Potassium: 3.7 mmol/L (ref 3.5–5.1)
Sodium: 147 mmol/L — ABNORMAL HIGH (ref 135–145)

## 2019-02-11 LAB — CBC
HCT: 29.5 % — ABNORMAL LOW (ref 36.0–46.0)
Hemoglobin: 9.4 g/dL — ABNORMAL LOW (ref 12.0–15.0)
MCH: 27.3 pg (ref 26.0–34.0)
MCHC: 31.9 g/dL (ref 30.0–36.0)
MCV: 85.8 fL (ref 80.0–100.0)
Platelets: 93 10*3/uL — ABNORMAL LOW (ref 150–400)
RBC: 3.44 MIL/uL — ABNORMAL LOW (ref 3.87–5.11)
RDW: 20.2 % — ABNORMAL HIGH (ref 11.5–15.5)
WBC: 4.5 10*3/uL (ref 4.0–10.5)
nRBC: 1.3 % — ABNORMAL HIGH (ref 0.0–0.2)

## 2019-02-11 LAB — GLUCOSE, CAPILLARY
Glucose-Capillary: 101 mg/dL — ABNORMAL HIGH (ref 70–99)
Glucose-Capillary: 111 mg/dL — ABNORMAL HIGH (ref 70–99)
Glucose-Capillary: 145 mg/dL — ABNORMAL HIGH (ref 70–99)
Glucose-Capillary: 181 mg/dL — ABNORMAL HIGH (ref 70–99)

## 2019-02-11 LAB — MAGNESIUM: Magnesium: 1.8 mg/dL (ref 1.7–2.4)

## 2019-02-11 MED ORDER — POTASSIUM CHLORIDE ER 20 MEQ PO TBCR: 20.0000 meq | EXTENDED_RELEASE_TABLET | Freq: Every day | ORAL | 3 refills | Status: DC

## 2019-02-11 MED ORDER — FUROSEMIDE 40 MG PO TABS: 40.0000 mg | ORAL_TABLET | Freq: Every day | ORAL | 3 refills | Status: DC

## 2019-02-11 NOTE — Progress Notes (Signed)
Received call from patient's son Louann Sjogren. And husband Tura Paulhus this afternoon. Stated home medical equipment will not be delivered until tomorrow. Preferred to have patient discharge tomorrow once equipment delivered. Notified Dr. Manuella Ghazi. Discharge on hold until tomorrow. Patient, husband and son Louann Sjogren aware. Donavan Foil, RN

## 2019-02-11 NOTE — Discharge Summary (Signed)
Physician Discharge Summary  Lindsey Mcguire [id number] DOB: 1934-09-28 DOA: 02/05/2019  PCP: Leeroy Cha, MD  Admit date: 02/05/2019  Discharge date: 02/11/2019  Admitted From:Home  Disposition:  Home  Recommendations for Outpatient Follow-up:  1. Follow up with PCP in 1-2 weeks and repeat BMP at that time 2. Continue on Lasix 40 mg daily along with potassium supplementation as prescribed 3. Monitor daily weights 4. Continue other home medications with the exception of amlodipine and Actos.  Will need close home glucose monitoring as well.  These medications have been discontinued on account of the fact that they contribute to fluid retention and swelling. 5. Discussed with family extensively the need for patient to go to inpatient rehabilitation facility due to her being very challenging to care for her at home as she is max assist.  They refuse to send her to a rehabilitation facility and would like for her to come home as does the patient.  We will offer every assistance possible at home in the meantime.  Home Health: Yes with PT, RN, Education officer, museum, and home health aide  Equipment/Devices: Yes with wheelchair, rolling walker, hospital bed, 3 and 1, and shower bench  Discharge Condition: Stable  CODE STATUS: Full  Diet recommendation: Heart Healthy/Carb Modified  Brief/Interim Summary: Per HPI: Lindsey Mcguire a84 y.o.female,w hypertension, Dm2, Anemia, presents due to edema. Pt is sleepy and unwilling to provide history.  Patient was admitted with symptoms of dyspnea and edema likely secondary to acute on chronic diastolic CHF. Prior 2D echocardiogram on 10/2018 with LVEF 60-65% noted. Further history obtained from son and his fiance regarding her unwillingness to come to the hospital over the last 2 months despite worsening shortness of breath and volume overload. Over the last 2 weeks it appears that she has been more altered as well and sleepy at  home. So there also appears to be a component of encephalopathy present that needs further evaluation. Toxicology with no findings at this moment and urine analysis does not demonstrate any signs of UTI. We will obtain ammonia levels as well as TSH to further evaluate. ABG does not demonstrate any significant hypercapnia. CT of the head with no acute findings.  10/11:Patient continues to diurese quite well and is negative almost 1 L fluid balance. Her mentation appears to be improved this morning, however labs are pending as she is a difficult IV access. She will require PICC line which has been ordered. VQ scan still pending. PT evaluation pending.  10/12:Patient continues to diurese well with close to 2 L net negative fluid balance noted overnight. She appears to be improving steadily. Will reorder ammonia and free T4 levels as these appear not to have been collected. VQ scan still pending, but low suspicion for PE. PICC line ordered and placed. PT evaluation ordered. Patient for thoracentesis today due to moderate to large fluid collection to right pleural cavity.  10/13:Patient continues to diurese well. Ammonia level is within normal limits and T4 levels are elevated. VQ scan with low probability for PE noted. PT evaluation reveals need for SNF. Thoracentesis performed on 10/12 with 800 cc of fluid removed and patient is struggling with some bleeding from the site at the moment.  10/14: Patient continues to diurese, but appears quite lethargic still.  No further bleeding from thoracentesis site noted.  PT reevaluation reveals need for SNF as she is max assist.  Family members would like to take her home with home health physical therapy, but will decide by tomorrow if  this is still the case.  10/15: Patient appears to be at dry weight and has completed course of diuresis IV to renal intolerance.  Her weight is documented at 203 pounds on day of discharge whereas it appears to  be 210 pounds on admission.  She appears to be doing well from a respiratory standpoint and is more awake and alert.  No further bleeding noted from thoracentesis site and her labs demonstrate good stability with creatinine 1.7.  She does not require any home oxygen and family members were notified that she would benefit greatly from inpatient rehabilitation, but adamantly refused to have her go to the facility and would like for her to come home.  The patient would also like to go home and refuses facility despite the fact that she is max assist and cannot adequately ambulate.  Will give equipment as noted above and will need close follow-up with PCP.  No other acute events noted throughout the course of this admission.  Discharge Diagnoses:  Principal Problem:   CHF (congestive heart failure) (HCC) Active Problems:   Anemia due to chronic renal failure treated with erythropoietin   Type 2 diabetes mellitus with stage 4 chronic kidney disease, without long-term current use of insulin (HCC)   Acute CHF (congestive heart failure) ([place])  Principal discharge diagnosis: Acute on chronic diastolic CHF exacerbation.  Discharge Instructions  Discharge Instructions    Diet - low sodium heart healthy   Complete by: As directed    For home use only DME Hospital bed   Complete by: As directed    Length of Need: 12 Months   The above medical condition requires: Patient requires the ability to reposition frequently   Head must be elevated greater than: 30 degrees   Bed type: Semi-electric   Increase activity slowly   Complete by: As directed      Allergies as of 02/11/2019   No Known Allergies     Medication List    STOP taking these medications   amLODipine 5 MG tablet Commonly known as: NORVASC   pioglitazone 45 MG tablet Commonly known as: ACTOS     TAKE these medications   aspirin 81 MG tablet Take 81 mg by mouth daily.   atorvastatin 40 MG tablet Commonly known as: LIPITOR Take  40 mg by mouth daily.   Calcium 600 High Potency 600 MG Tabs tablet Generic drug: calcium carbonate Take 600 mg by mouth 2 (two) times daily with a meal.   cholecalciferol 25 MCG (1000 UT) tablet Commonly known as: VITAMIN D3 Take 1,000 Units by mouth daily.   ferrous sulfate 325 (65 FE) MG tablet Take 650 mg by mouth daily with breakfast.   furosemide 40 MG tablet Commonly known as: LASIX Take 1 tablet (40 mg total) by mouth daily.   MIRALAX PO Take 17 g by mouth daily as needed (constipation).   multivitamin tablet Take 1 tablet by mouth daily.   olmesartan-hydrochlorothiazide 40-12.5 MG tablet Commonly known as: BENICAR HCT Take 1 tablet by mouth daily.   Potassium Chloride ER 20 MEQ Tbcr Take 20 mEq by mouth daily.   SYSTANE OP Place 2 drops into both eyes 4 (four) times daily as needed (dryness).            Durable Medical Equipment  (From admission, onward)         Start     Ordered   02/11/19 1321  DME tub bench  Once     02/11/19 1320  02/11/19 1320  For home use only DME standard manual wheelchair with seat cushion  (Wheelchairs)  Once    Comments: Patient suffers from weakness which impairs their ability to perform daily activities like ADLs in the home.  A walking aid will not resolve issue with performing activities of daily living. A wheelchair will allow patient to safely perform daily activities. Patient can safely propel the wheelchair in the home or has a caregiver who can provide assistance. Length of need 1 year. Accessories: wheel locks.   02/11/19 1320   02/11/19 1320  For home use only DME Walker rolling  [facility])  Once    Question:  Patient needs a walker to treat with the following condition  Answer:  Weakness   02/11/19 1320   02/11/19 1318  DME 3-in-1  Once     02/11/19 1320   02/11/19 0000  For home use only DME Hospital bed    Question Answer Comment  Length of Need 12 Months   The above medical condition requires: Patient  requires the ability to reposition frequently   Head must be elevated greater than: 30 degrees   Bed type Semi-electric      02/11/19 1320         Follow-up Information    [facility].   Specialty: Home Health Services Why: PT       Leeroy Cha, MD Follow up in 1 week(s).   Specialty: Internal Medicine Contact information: [address] [place] [address] [phone]          No Known Allergies  Consultations:  None   Procedures/Studies: Dg Chest 1 View  Result Date: 02/08/2019 CLINICAL DATA:  84 year old female with history of right-sided pleural effusion status post thoracentesis. EXAM: CHEST  1 VIEW COMPARISON:  Chest x-ray 02/05/2019. FINDINGS: Compared to the prior study, the previously noted right-sided pleural effusion has decreased in size, now moderate to small. No definite pneumothorax. Lung volumes remain low. Extensive bibasilar opacities may reflect areas of atelectasis and/or consolidation, with superimposed pleural effusions (small to moderate on the left). Pulmonary vasculature crowding, without frank pulmonary edema. Cardiac silhouette is obscured. Upper mediastinal contours are distorted by patient's rotation to the right. New right upper extremity PICC with tip terminating in the distal superior vena cava. Orthopedic fixation hardware in the lower cervical spine incidentally noted. IMPRESSION: 1. Decreased size of right-sided pleural effusion following thoracentesis. No appreciable pneumothorax or other acute complicating features. 2. Low lung volumes with bibasilar opacities which may reflect areas of atelectasis and/or consolidation, with superimposed bilateral pleural effusions. 3. Support apparatus and postoperative changes, as above. Electronically Signed   By: Vinnie Langton M.D.   On: 02/08/2019 13:41   Ct Head Wo Contrast  Result Date: 02/06/2019 CLINICAL DATA:  Encephalopathy EXAM: CT  HEAD WITHOUT CONTRAST TECHNIQUE: Contiguous axial images were obtained from the base of the skull through the vertex without intravenous contrast. COMPARISON:  Head CT 02/23/2017 FINDINGS: Brain: There is no mass, hemorrhage or extra-axial collection. There is generalized atrophy without lobar predilection. Hypodensity of the white matter is most commonly associated with chronic microvascular disease. Vascular: No abnormal hyperdensity of the major intracranial arteries or dural venous sinuses. No intracranial atherosclerosis. Skull: The visualized skull base, calvarium and extracranial soft tissues are normal. Sinuses/Orbits: No fluid levels or advanced mucosal thickening of the visualized paranasal sinuses. No mastoid or middle ear effusion. The orbits are normal. IMPRESSION: Generalized atrophy and chronic microvascular ischemia  without acute intracranial abnormality. Electronically Signed   By: Ulyses Jarred M.D.   On: 02/06/2019 00:57   Ct Chest Wo Contrast  Result Date: 02/06/2019 CLINICAL DATA:  Pleural effusion, AMS Ems was called out earlier for sob and pt refused to be evaluated so family took out ivc paperwork. Per family pt is not compliant with home meds and has been having visual and auditory hallucinations. When.*comment was truncated*Pleural effusion EXAM: CT CHEST WITHOUT CONTRAST TECHNIQUE: Multidetector CT imaging of the chest was performed following the standard protocol without IV contrast. COMPARISON:  None. FINDINGS: Cardiovascular: Coronary artery calcification and aortic atherosclerotic calcification. Mediastinum/Nodes: No axillary supraclavicular adenopathy. No mediastinal adenopathy. Trace pericardial effusion. Lungs/Pleura: Very low lung volumes. Moderate to large RIGHT pleural effusion. Small LEFT pleural effusion. There is bibasilar atelectasis. Some patchy airspace disease in the lungs suggesting edema or infection. Upper Abdomen: Limited view of the liver, kidneys, pancreas are  unremarkable. Normal adrenal glands. Musculoskeletal: Anasarca of the soft tissues. No aggressive osseous lesion IMPRESSION: 1. Moderate to large RIGHT pleural effusion and small LEFT pleural effusion. 2. Bibasilar atelectasis. 3. Patchy airspace disease in the lungs suggesting edema or infection. 4. Anasarca of the soft tissues. Aortic Atherosclerosis (ICD10-I70.0). Electronically Signed   By: Suzy Bouchard M.D.   On: 02/06/2019 04:32   Nm Pulmonary Perf And Vent  Result Date: 02/08/2019 CLINICAL DATA:  Positive D-dimer.  PE suspected. EXAM: NUCLEAR MEDICINE VENTILATION - PERFUSION LUNG SCAN TECHNIQUE: Ventilation images were obtained in multiple projections using inhaled aerosol Tc-5m DTPA. Perfusion images were obtained in multiple projections after intravenous injection of Tc-2m MAA. RADIOPHARMACEUTICALS:  1.4 mCi of Tc-65m DTPA aerosol inhalation and 32.4 mCi Tc42m MAA IV COMPARISON:  Chest x-ray from same day.  Chest CT 02/06/2019 FINDINGS: Ventilation: No mismatched ventilation defect. Perfusion: Asymmetric decreased perfusion at the left base compatible with asymmetric elevation of the left hemidiaphragm. Decreased perfusion in the lower lungs consistent with bibasilar collapse/consolidation and effusions seen on chest x-ray from today and chest CT from 2 days ago. IMPRESSION: No ventilation perfusion mismatch. Low probability for pulmonary embolus. Electronically Signed   By: Misty Stanley M.D.   On: 02/08/2019 14:40   Dg Chest Portable 1 View  Result Date: 02/05/2019 CLINICAL DATA:  Shortness of breath EXAM: PORTABLE CHEST 1 VIEW COMPARISON:  None. FINDINGS: The lung volumes are very low. The heart size is significantly enlarged. There are likely moderate sized bilateral pleural effusions with adjacent atelectasis. There are prominent interstitial lung markings bilaterally. There is no pneumothorax. No evidence for an acute osseous abnormality. IMPRESSION: 1. Very low lung volumes. 2.  Cardiomegaly. 3. Overall findings concerning for congestive heart failure with small to moderate-sized bilateral pleural effusions and adjacent compressive atelectasis. Electronically Signed   By: Constance Holster M.D.   On: 02/05/2019 22:33   Dg Hand Complete Left  Result Date: 01/26/2019 CLINICAL DATA:  Pain and swelling about the left hand since a fall 01/24/2019. Initial encounter. EXAM: LEFT HAND - COMPLETE 3+ VIEW COMPARISON:  None. FINDINGS: Soft tissues are diffusely an intensely swollen. No radiopaque foreign body or soft tissue gas is identified. No fracture or dislocation. No evidence of arthropathy. IMPRESSION: Intense and diffuse soft tissue swelling without underlying fracture or foreign body. Electronically Signed   By: Inge Rise M.D.   On: 01/26/2019 15:04   [place] Ekg Site Rite  Result Date: 02/07/2019 If Site Rite image not attached, placement could not be confirmed due to current cardiac rhythm.  US Thoracentesis  Asp Pleural Space W/img Guide  Result Date: 02/08/2019 INDICATION: 84 year old female with history of right-sided pleural effusion. EXAM: ULTRASOUND GUIDED RIGHT THORACENTESIS MEDICATIONS: None. COMPLICATIONS: None immediate. PROCEDURE: An ultrasound guided thoracentesis was thoroughly discussed with the patient and questions answered. The benefits, risks, alternatives and complications were also discussed. The patient understands and wishes to proceed with the procedure. Written consent was obtained. Ultrasound was performed to localize and mark an adequate pocket of fluid in the right chest. The area was then prepped and draped in the normal sterile fashion. 1% Lidocaine was used for local anesthesia. Under ultrasound guidance a 5 [place] Yueh centesis catheter was introduced. Thoracentesis was performed. The catheter was removed and a dressing applied. FINDINGS: A total of approximately 800 mL of serous fluid was removed. Samples were sent to the laboratory as  requested by the clinical team. IMPRESSION: Successful ultrasound guided right-sided thoracentesis yielding 800 mL of pleural fluid. Electronically Signed   By: Vinnie Langton M.D.   On: 02/08/2019 14:02     Discharge Exam: Vitals:   02/11/19 1245 02/11/19 1248  BP:    Pulse:    Resp:    Temp:    SpO2: 90% 100%   Vitals:   02/11/19 0500 02/11/19 0524 02/11/19 1245 02/11/19 1248  BP:  (!) 141/80    Pulse:  89    Resp:  16    Temp:  98.2 F (36.8 C)    TempSrc:  Oral    SpO2:  98% 90% 100%  Weight: 92.5 kg     Height:        General: Pt is alert, awake, not in acute distress Cardiovascular: RRR, S1/S2 +, no rubs, no gallops Respiratory: CTA bilaterally, no wheezing, no rhonchi Abdominal: Soft, NT, ND, bowel sounds + Extremities: no edema, no cyanosis    The results of significant diagnostics from this hospitalization (including imaging, microbiology, ancillary and laboratory) are listed below for reference.     Microbiology: Recent Results (from the past 240 hour(s))  SARS CORONAVIRUS 2 (TAT 6-24 HRS) Nasopharyngeal Nasopharyngeal Swab     Status: None   Collection Time: 02/05/19 11:49 PM   Specimen: Nasopharyngeal Swab  Result Value Ref Range Status   SARS Coronavirus 2 NEGATIVE NEGATIVE Final    Comment: (NOTE) SARS-CoV-2 target nucleic acids are NOT DETECTED. The SARS-CoV-2 RNA is generally detectable in upper and lower respiratory specimens during the acute phase of infection. Negative results do not preclude SARS-CoV-2 infection, do not rule out co-infections with other pathogens, and should not be used as the sole basis for treatment or other patient management decisions. Negative results must be combined with clinical observations, patient history, and epidemiological information. The expected result is Negative. Fact Sheet for Patients: SugarRoll.be Fact Sheet for Healthcare  Providers: https://www.woods-mathews.com/ This test is not yet approved or cleared by the [place] FDA and  has been authorized for detection and/or diagnosis of SARS-CoV-2 by FDA under an Emergency Use Authorization (EUA). This EUA will remain  in effect (meaning this test can be used) for the duration of the COVID-19 declaration under Section 56 4(b)(1) of the Act, 21 U.S.C. section 360bbb-3(b)(1), unless the authorization is terminated or revoked sooner. Performed at [facility], [place] [address], [place], [place] [address]   Gram stain     Status: None   Collection Time: 02/08/19 10:33 AM   Specimen: Lung, Right; Pleural Fluid  Result Value Ref Range Status   Specimen Description PERITONEAL  Final  Special Requests NONE  Final   Gram Stain   Final    NO ORGANISMS SEEN CYTOSPIN SMEAR WBC PRESENT,BOTH PMN AND MONONUCLEAR Performed at [facility], [address] [place], [place] [address]    Report Status 02/08/2019 FINAL  Final  Culture, body fluid-bottle     Status: None (Preliminary result)   Collection Time: 02/08/19 10:34 AM   Specimen: Ascitic  Result Value Ref Range Status   Specimen Description ASCITIC  Final   Special Requests BOTTLES DRAWN AEROBIC AND ANAEROBIC 10CC  Final   Culture   Final    NO GROWTH 3 DAYS Performed at [facility], [address], [place], [place] [address]    Report Status PENDING  Incomplete     Labs: BNP (last 3 results) Recent Labs    02/05/19 2154  BNP [id number]*   Basic Metabolic Panel: Recent Labs  Lab 02/07/19 1329 02/08/19 0500 02/09/19 0525 02/10/19 0504 02/11/19 0409  NA 145 145 145 147* 147*  K 3.1* 3.1* 3.3* 4.1 3.7  CL 108 105 105 108 107  CO2 27 27 28 27 29   GLUCOSE 108* 94 128* 123* 101*  BUN 22 23 23  24* 23  CREATININE 1.62* 1.67* 1.59* 1.62* 1.71*  CALCIUM 9.0 9.3 8.9 9.0 9.1  MG  --  1.9 1.8 1.8 1.8   Liver Function Tests: Recent Labs  Lab 02/05/19 2154 02/06/19 0624  AST 26 32   ALT 18 21  ALKPHOS 168* 146*  BILITOT 1.2 1.8*  PROT 7.6 7.0  ALBUMIN 3.1* 2.8*   No results for input(s): LIPASE, AMYLASE in the last 168 hours. Recent Labs  Lab 02/07/19 1329 02/08/19 1413  AMMONIA 17 17   CBC: Recent Labs  Lab 02/05/19 2154 02/06/19 1233 02/07/19 1329 02/09/19 0525 02/10/19 0504 02/11/19 0409  WBC 4.4 4.0 4.5 5.1 5.5 4.5  NEUTROABS 3.5  --   --   --   --   --   HGB 11.2* 10.6* 10.2* 10.1* 9.5* 9.4*  HCT 36.0 33.5* 32.3* 31.5* 29.6* 29.5*  MCV 86.1 85.7 85.4 84.0 84.3 85.8  PLT 118* 115* 111* 112* 98* 93*   Cardiac Enzymes: No results for input(s): CKTOTAL, CKMB, CKMBINDEX, TROPONINI in the last 168 hours. BNP: Invalid input(s): POCBNP CBG: Recent Labs  Lab 02/10/19 1130 02/10/19 1627 02/10/19 2040 02/11/19 0757 02/11/19 1128  GLUCAP 108* 88 111* 101* 111*   D-Dimer No results for input(s): DDIMER in the last 72 hours. Hgb A1c No results for input(s): HGBA1C in the last 72 hours. Lipid Profile No results for input(s): CHOL, HDL, LDLCALC, TRIG, CHOLHDL, LDLDIRECT in the last 72 hours. Thyroid function studies No results for input(s): TSH, T4TOTAL, T3FREE, THYROIDAB in the last 72 hours.  Invalid input(s): FREET3 Anemia work up No results for input(s): VITAMINB12, FOLATE, FERRITIN, TIBC, IRON, RETICCTPCT in the last 72 hours. Urinalysis    Component Value Date/Time   COLORURINE YELLOW 02/05/2019 2200   APPEARANCEUR CLEAR 02/05/2019 2200   LABSPEC 1.025 02/05/2019 2200   PHURINE 5.5 02/05/2019 2200   GLUCOSEU NEGATIVE 02/05/2019 2200   HGBUR TRACE (A) 02/05/2019 2200   BILIRUBINUR SMALL (A) 02/05/2019 2200   KETONESUR NEGATIVE 02/05/2019 2200   PROTEINUR 30 (A) 02/05/2019 2200   UROBILINOGEN 0.2 05/15/2010 1725   NITRITE NEGATIVE 02/05/2019 2200   LEUKOCYTESUR TRACE (A) 02/05/2019 2200   Sepsis Labs Invalid input(s): PROCALCITONIN,  WBC,  LACTICIDVEN Microbiology Recent Results (from the past 240 hour(s))  SARS CORONAVIRUS 2  (TAT 6-24  HRS) Nasopharyngeal Nasopharyngeal Swab     Status: None   Collection Time: 02/05/19 11:49 PM   Specimen: Nasopharyngeal Swab  Result Value Ref Range Status   SARS Coronavirus 2 NEGATIVE NEGATIVE Final    Comment: (NOTE) SARS-CoV-2 target nucleic acids are NOT DETECTED. The SARS-CoV-2 RNA is generally detectable in upper and lower respiratory specimens during the acute phase of infection. Negative results do not preclude SARS-CoV-2 infection, do not rule out co-infections with other pathogens, and should not be used as the sole basis for treatment or other patient management decisions. Negative results must be combined with clinical observations, patient history, and epidemiological information. The expected result is Negative. Fact Sheet for Patients: SugarRoll.be Fact Sheet for Healthcare Providers: https://www.woods-mathews.com/ This test is not yet approved or cleared by the [place] FDA and  has been authorized for detection and/or diagnosis of SARS-CoV-2 by FDA under an Emergency Use Authorization (EUA). This EUA will remain  in effect (meaning this test can be used) for the duration of the COVID-19 declaration under Section 56 4(b)(1) of the Act, 21 U.S.C. section 360bbb-3(b)(1), unless the authorization is terminated or revoked sooner. Performed at [facility], [place] [address], [place], [place] [address]   Gram stain     Status: None   Collection Time: 02/08/19 10:33 AM   Specimen: Lung, Right; Pleural Fluid  Result Value Ref Range Status   Specimen Description PERITONEAL  Final   Special Requests NONE  Final   Gram Stain   Final    NO ORGANISMS SEEN CYTOSPIN SMEAR WBC PRESENT,BOTH PMN AND MONONUCLEAR Performed at [facility], [address], [place], [place] [address]    Report Status 02/08/2019 FINAL  Final  Culture, body fluid-bottle     Status: None (Preliminary result)   Collection Time: 02/08/19  10:34 AM   Specimen: Ascitic  Result Value Ref Range Status   Specimen Description ASCITIC  Final   Special Requests BOTTLES DRAWN AEROBIC AND ANAEROBIC 10CC  Final   Culture   Final    NO GROWTH 3 DAYS Performed at [facility], [address], [place], [place] [address]    Report Status PENDING  Incomplete     Time coordinating discharge: 50 minutes  SIGNED:   Rodena Goldmann, DO [facility] 02/11/2019, 1:24 PM  If 7PM-7AM, please contact night-coverage [email or web address] Password [email or web address]

## 2019-02-11 NOTE — TOC Progression Note (Signed)
Transition of Care [facility]) - Progression Note    Patient Details  Name: Lindsey Mcguire MRN: [id number] Date of Birth: 03-28-1935  Transition of Care [facility]) CM/SW Contact  Shade Flood, LCSW Phone Number: 02/11/2019, 4:05 PM  Clinical Narrative:     Pt stable for dc. Family continues to want HH rather than SNF. Dr. Manuella Ghazi has talked to husband and son to encourage SNF and they continue to insist on taking pt home. Pt also expresses that she wants to go home. Arranged DME and HH. Pt will dc home tomorrow as family could not coordinate with the DME agency for hospital bed delivery today.  Will follow up tomorrow.  Expected Discharge Plan: [place] Barriers to Discharge: Barriers Resolved  Expected Discharge Plan and Services Expected Discharge Plan: [place] In-house Referral: Clinical Social Work   Post Acute Care Choice: [place] arrangements for the past 2 months: [place] Expected Discharge Date: 02/11/19               DME Arranged: Bedside commode, Hospital bed, Tub bench, Wheelchair manual, Walker rolling DME Agency: [employer] Date DME Agency Contacted: 02/11/19 Time DME Agency Contacted: [phone] Representative spoke with at DME Agency: [place] HH Arranged: RN, PT, Nurse's Aide, Social Work [employer] Agency: [place] ([place]) Date [place]: 02/11/19 Time [place]: [place] Representative spoke with at [place]: [place] ([place]) Interventions    Readmission Risk Interventions Readmission Risk Prevention Plan 02/11/2019  Transportation Screening Complete  Home Care Screening Complete  Medication Review (RN CM) Complete  Some recent data might be hidden

## 2019-02-11 NOTE — Progress Notes (Signed)
Patient to discharge home with home health today. Awaiting home medical equipment to be delivered to her home prior to discharge per CSW. States she will notify us once delivered and patient ready for EMS transport home. Donavan Foil, RN

## 2019-02-11 NOTE — Progress Notes (Signed)
SATURATION QUALIFICATIONS: (This note is used to comply with regulatory documentation for home oxygen)  Patient Saturations on Room Air at Rest = 90-95%  Patient Saturations on Room Air while Ambulating = unable to ambulate at this time due to weakness. MD aware.   Patient Saturations on 2 Liters of oxygen while at rest = 100%  Please briefly explain why patient needs home oxygen: Notified MD of O2 sats on r/a at rest and on 2 lpm at rest. No home O2 needs at this time.

## 2019-02-12 LAB — GLUCOSE, CAPILLARY
Glucose-Capillary: 103 mg/dL — ABNORMAL HIGH (ref 70–99)
Glucose-Capillary: 117 mg/dL — ABNORMAL HIGH (ref 70–99)

## 2019-02-12 LAB — BASIC METABOLIC PANEL
Anion gap: 10 (ref 5–15)
BUN: 27 mg/dL — ABNORMAL HIGH (ref 8–23)
CO2: 30 mmol/L (ref 22–32)
Calcium: 8.9 mg/dL (ref 8.9–10.3)
Chloride: 105 mmol/L (ref 98–111)
Creatinine, Ser: 1.81 mg/dL — ABNORMAL HIGH (ref 0.44–1.00)
GFR calc Af Amer: 30 mL/min — ABNORMAL LOW (ref >60)
GFR calc non Af Amer: 26 mL/min — ABNORMAL LOW (ref >60)
Glucose, Bld: 133 mg/dL — ABNORMAL HIGH (ref 70–99)
Potassium: 3.8 mmol/L (ref 3.5–5.1)
Sodium: 145 mmol/L (ref 135–145)

## 2019-02-12 NOTE — Care Management Important Message (Signed)
Important Message  Patient Details  Name: Lindsey Mcguire MRN: [id number] Date of Birth: February 04, 1935   Medicare Important Message Given:  Yes     Tommy Medal 02/12/2019, 3:25 PM

## 2019-02-12 NOTE — Progress Notes (Signed)
Son, Lindsey Sjogren., Called concerned about oxygen being delivered to home. Patient was on 2L of oxygen when discharged  And her O2 sat without was 88-89% According to documentation, patient had been on 2L since 1248 on 02/11/2019. Contacted MD to inform him and to ask about oxygen order and delivery and informed that patient did not need oxygen. He stated to let the family know that patient "did not need oxygen" and that she was "doing well without it". Called Lindsey Sjogren. And informed him of this information.

## 2019-02-12 NOTE — TOC Transition Note (Signed)
Transition of Care [facility]) - CM/SW Discharge Note   Patient Details  Name: Lindsey Mcguire MRN: [id number] Date of Birth: 09-20-1934  Transition of Care [facility]) CM/SW Contact:  Shade Flood, LCSW Phone Number: 02/12/2019, 3:47 PM   Clinical Narrative:     Pt stable for dc. DME has been delivered to pt's home and family aware that EMS will be bringing pt home. Updated Linda at [facility] that pt would be discharging today. EMS form printed to floor. RN will call EMS when pt ready.  There are no other TOC needs for dc.  Final next level of care: Home w Home Health Services Barriers to Discharge: Family Issues   Patient Goals and CMS Choice Patient states their goals for this hospitalization and ongoing recovery are:: to go home with home health. CMS Medicare.gov Compare Post Acute Care list provided to:: Patient Represenative (must comment)(son) Choice offered to / list presented to : Adult Children  Discharge Placement                       Discharge Plan and Services In-house Referral: Clinical Social Work   Post Acute Care Choice: Home Health          DME Arranged: Bedside commode, Hospital bed, Tub bench, Wheelchair manual, Walker rolling DME Agency: [employer] Date DME Agency Contacted: 02/11/19 Time DME Agency Contacted: [phone] Representative spoke with at DME Agency: [place] HH Arranged: RN, PT, Nurse's Aide, Social Work [employer] Agency: [place] ([place]) Date [place]: 02/11/19 Time [place]: [place] Representative spoke with at [place]: [place] ([place]) Interventions     Readmission Risk Interventions Readmission Risk Prevention Plan 02/12/2019 02/11/2019  Transportation Screening - Complete  PCP or Specialist Appt within 5-7 Days Complete -  Home Care Screening - Complete  Medication Review (RN CM) - Complete  Some recent data might be hidden

## 2019-02-12 NOTE — Progress Notes (Addendum)
Patient seen and evaluated this morning.  She appears stable for discharge today and denies any symptomatic complaints or concerns.  She is awaiting equipment delivery at home which should be taking care of today.  No other acute complaints or concerns noted.  Please refer to discharge summary dictated 10/15.  Total care time: 15 minutes.

## 2019-02-12 NOTE — Progress Notes (Signed)
Nsg Discharge Note  Admit Date:  02/05/2019 Discharge date: 02/12/2019   Neomia Dear to be D/C'd home per MD order.  AVS completed.  Copy for chart, and copy for patient signed, and dated. Patient/caregiver able to verbalize understanding.  Discharge Medication: Allergies as of 02/12/2019   No Known Allergies     Medication List    STOP taking these medications   amLODipine 5 MG tablet Commonly known as: NORVASC   pioglitazone 45 MG tablet Commonly known as: ACTOS     TAKE these medications   aspirin 81 MG tablet Take 81 mg by mouth daily.   atorvastatin 40 MG tablet Commonly known as: LIPITOR Take 40 mg by mouth daily.   Calcium 600 High Potency 600 MG Tabs tablet Generic drug: calcium carbonate Take 600 mg by mouth 2 (two) times daily with a meal.   cholecalciferol 25 MCG (1000 UT) tablet Commonly known as: VITAMIN D3 Take 1,000 Units by mouth daily.   ferrous sulfate 325 (65 FE) MG tablet Take 650 mg by mouth daily with breakfast.   furosemide 40 MG tablet Commonly known as: LASIX Take 1 tablet (40 mg total) by mouth daily.   MIRALAX PO Take 17 g by mouth daily as needed (constipation).   multivitamin tablet Take 1 tablet by mouth daily.   olmesartan-hydrochlorothiazide 40-12.5 MG tablet Commonly known as: BENICAR HCT Take 1 tablet by mouth daily.   Potassium Chloride ER 20 MEQ Tbcr Take 20 mEq by mouth daily.   SYSTANE OP Place 2 drops into both eyes 4 (four) times daily as needed (dryness).            Durable Medical Equipment  (From admission, onward)         Start     Ordered   02/11/19 1321  DME tub bench  Once     02/11/19 1320   02/11/19 1320  For home use only DME standard manual wheelchair with seat cushion  (Wheelchairs)  Once    Comments: Patient suffers from weakness which impairs their ability to perform daily activities like ADLs in the home.  A walking aid will not resolve issue with performing activities of daily  living. A wheelchair will allow patient to safely perform daily activities. Patient can safely propel the wheelchair in the home or has a caregiver who can provide assistance. Length of need 1 year. Accessories: wheel locks.   02/11/19 1320   02/11/19 1320  For home use only DME Walker rolling  [facility])  Once    Question:  Patient needs a walker to treat with the following condition  Answer:  Weakness   02/11/19 1320   02/11/19 1318  DME 3-in-1  Once     02/11/19 1320   02/11/19 0000  For home use only DME Hospital bed    Question Answer Comment  Length of Need 12 Months   The above medical condition requires: Patient requires the ability to reposition frequently   Head must be elevated greater than: 30 degrees   Bed type Semi-electric      02/11/19 1320          Discharge Assessment: Vitals:   02/12/19 0543 02/12/19 1321  BP: 124/81 117/80  Pulse: 86 83  Resp: 18 16  Temp: 97.9 F (36.6 C) 97.8 F (36.6 C)  SpO2: 100% 100%   Skin clean, dry and intact without evidence of skin break down, no evidence of skin tears noted. IV catheter discontinued intact. Site without  signs and symptoms of complications - no redness or edema noted at insertion site, patient denies c/o pain - only slight tenderness at site.  Dressing with slight pressure applied.  D/c Instructions-Education: Discharge instructions given to patient/family with verbalized understanding. D/c education completed with patient/family including follow up instructions, medication list, d/c activities limitations if indicated, with other d/c instructions as indicated by MD - patient able to verbalize understanding, all questions fully answered. Patient instructed to return to ED, call 911, or call MD for any changes in condition.  Patient escorted via [place], and D/C home via private auto.  Venita Sheffield, RN 02/12/2019 4:14 PM

## 2019-02-12 NOTE — Progress Notes (Signed)
PICC removed and measured 42. Pressure dressing applied. Patient tolerated well. Patient is prepared for transport home via EMS. Family has notified staff that all equipment has been delivered to the home. EMS has been called and are now awaiting their arrival.

## 2019-02-13 LAB — CULTURE, BODY FLUID W GRAM STAIN -BOTTLE: Culture: NO GROWTH

## 2019-03-10 ENCOUNTER — Other Ambulatory Visit: Payer: Self-pay

## 2019-03-10 ENCOUNTER — Inpatient Hospital Stay: Payer: Medicare Other

## 2019-03-10 ENCOUNTER — Other Ambulatory Visit: Payer: Self-pay | Admitting: Hematology

## 2019-03-10 ENCOUNTER — Inpatient Hospital Stay: Payer: Medicare Other | Attending: Hematology

## 2019-03-10 DIAGNOSIS — I129 Hypertensive chronic kidney disease with stage 1 through stage 4 chronic kidney disease, or unspecified chronic kidney disease: Secondary | ICD-10-CM | POA: Insufficient documentation

## 2019-03-10 DIAGNOSIS — N184 Chronic kidney disease, stage 4 (severe): Secondary | ICD-10-CM | POA: Insufficient documentation

## 2019-03-10 DIAGNOSIS — D631 Anemia in chronic kidney disease: Secondary | ICD-10-CM | POA: Diagnosis not present

## 2019-03-10 DIAGNOSIS — N183 Chronic kidney disease, stage 3 unspecified: Secondary | ICD-10-CM

## 2019-03-10 DIAGNOSIS — E1122 Type 2 diabetes mellitus with diabetic chronic kidney disease: Secondary | ICD-10-CM | POA: Diagnosis not present

## 2019-03-10 LAB — CBC WITH DIFFERENTIAL/PLATELET
Abs Immature Granulocytes: 0.01 10*3/uL (ref 0.00–0.07)
Basophils Absolute: 0 10*3/uL (ref 0.0–0.1)
Basophils Relative: 1 %
Eosinophils Absolute: 0.1 10*3/uL (ref 0.0–0.5)
Eosinophils Relative: 2 %
HCT: 29.6 % — ABNORMAL LOW (ref 36.0–46.0)
Hemoglobin: 9.8 g/dL — ABNORMAL LOW (ref 12.0–15.0)
Immature Granulocytes: 0 %
Lymphocytes Relative: 17 %
Lymphs Abs: 0.6 10*3/uL — ABNORMAL LOW (ref 0.7–4.0)
MCH: 27.8 pg (ref 26.0–34.0)
MCHC: 33.1 g/dL (ref 30.0–36.0)
MCV: 83.9 fL (ref 80.0–100.0)
Monocytes Absolute: 0.5 10*3/uL (ref 0.1–1.0)
Monocytes Relative: 14 %
Neutro Abs: 2.4 10*3/uL (ref 1.7–7.7)
Neutrophils Relative %: 66 %
Platelets: 144 10*3/uL — ABNORMAL LOW (ref 150–400)
RBC: 3.53 MIL/uL — ABNORMAL LOW (ref 3.87–5.11)
RDW: 17.6 % — ABNORMAL HIGH (ref 11.5–15.5)
WBC: 3.6 10*3/uL — ABNORMAL LOW (ref 4.0–10.5)
nRBC: 0 % (ref 0.0–0.2)

## 2019-03-10 MED ORDER — EPOETIN ALFA-EPBX 40000 UNIT/ML IJ SOLN
40000.0000 [IU] | Freq: Once | INTRAMUSCULAR | Status: AC
  Administered 2019-03-10: 40000 [IU] via SUBCUTANEOUS

## 2019-03-10 MED ORDER — EPOETIN ALFA-EPBX 40000 UNIT/ML IJ SOLN
INTRAMUSCULAR | Status: AC
  Filled 2019-03-10: qty 1

## 2019-03-10 MED ORDER — DARBEPOETIN ALFA 300 MCG/0.6ML IJ SOSY
PREFILLED_SYRINGE | INTRAMUSCULAR | Status: AC
  Filled 2019-03-10: qty 0.6

## 2019-03-10 MED ORDER — DARBEPOETIN ALFA 300 MCG/0.6ML IJ SOSY: 300.0000 ug | PREFILLED_SYRINGE | Freq: Once | INTRAMUSCULAR | Status: DC

## 2019-03-10 NOTE — Progress Notes (Signed)
Pt insur requires switch to Retacrit.   Discussed w/ Dr. Burr Medico.  Will give Retacrit 40,000 units every 2 weeks and monitor response.  MD to adjust if needed.  Kennith Center, Pharm.D., CPP 03/10/2019@12 :57 PM

## 2019-03-10 NOTE — Patient Instructions (Addendum)

## 2019-03-10 NOTE — Progress Notes (Signed)
NOT Doing VS in person, patient has active bed bugs dropping off her body as her blood was drawn. She reports feeling well and Dr. Burr Medico does want her to have the shot today.

## 2019-03-30 ENCOUNTER — Telehealth (INDEPENDENT_AMBULATORY_CARE_PROVIDER_SITE_OTHER): Payer: Medicare Other | Admitting: Cardiology

## 2019-03-30 VITALS — BP 92/80 | HR 88 | Wt 115.0 lb

## 2019-03-30 DIAGNOSIS — I5032 Chronic diastolic (congestive) heart failure: Secondary | ICD-10-CM | POA: Diagnosis not present

## 2019-03-30 DIAGNOSIS — I10 Essential (primary) hypertension: Secondary | ICD-10-CM | POA: Diagnosis not present

## 2019-03-30 DIAGNOSIS — E785 Hyperlipidemia, unspecified: Secondary | ICD-10-CM | POA: Diagnosis not present

## 2019-03-30 NOTE — Progress Notes (Signed)
Virtual Visit via Telephone Note   This visit type was conducted due to national recommendations for restrictions regarding the COVID-19 Pandemic (e.g. social distancing) in an effort to limit this patient's exposure and mitigate transmission in our community.  Due to her co-morbid illnesses, this patient is at least at moderate risk for complications without adequate follow up.  This format is felt to be most appropriate for this patient at this time.  The patient did not have access to video technology/had technical difficulties with video requiring transitioning to audio format only (telephone).  All issues noted in this document were discussed and addressed.  No physical exam could be performed with this format.  Please refer to the patient's chart for her  consent to telehealth for [facility].   Date:  03/30/2019   ID:  Lindsey Mcguire, DOB December 08, 1934, MRN [id number]  Patient Location: Home Provider Location: Home  PCP:  Leeroy Cha, MD  Cardiologist:  No primary care provider on file.  Electrophysiologist:  None   Evaluation Performed:  New Patient Evaluation  Chief Complaint:  Diastolic heart failure  History of Present Illness:    Lindsey Mcguire is a 84 y.o. female with hypertension, type 2 diabetes presents for hospital follow-up after recent admission with diastolic heart failure.  She presented on 02/05/2019 with edema and shortness of breath.  She underwent IV diuresis, and had a thoracentesis performed on 10/12 with 800 cc of fluid removed.  She was discharged on 10/15, weight was down 7 pounds from admission (203) pounds on discharge.  Creatinine 1.8 on discharge.  She was discharged on Lasix 40 mg daily and potassium 20 mEq daily.  Since discharge, she reports that her dyspnea is significantly improved.  Does report that she continues to have some lower extremity edema but is significantly improved from her admission.  She denies any chest pain or  palpitations.  She states that she checks her blood pressure at home, can range from SBP 90s to 150s.  She denies any lightheadedness or syncope.   Past Medical History:  Diagnosis Date  . Anemia 10/2003  . Diabetes mellitus   . Hypertension    Past Surgical History:  Procedure Laterality Date  . CATARACT EXTRACTION  12/06/2003   left eye  . TUBAL LIGATION  1977  . YAG LASER APPLICATION Right [id number]   Procedure: YAG LASER APPLICATION;  Surgeon: Elta Guadeloupe T. Gershon Crane, MD;  Location: [facility];  Service: Ophthalmology;  Laterality: Right;     Current Meds  Medication Sig  . aspirin 81 MG tablet Take 81 mg by mouth daily.   Marland Kitchen atorvastatin (LIPITOR) 40 MG tablet Take 40 mg by mouth daily.  . calcium carbonate (CALCIUM 600 HIGH POTENCY) 600 MG TABS tablet Take 600 mg by mouth 2 (two) times daily with a meal.  . cholecalciferol (VITAMIN D3) 25 MCG (1000 UT) tablet Take 1,000 Units by mouth daily.  . ferrous sulfate 325 (65 FE) MG tablet Take 650 mg by mouth daily with breakfast.   . furosemide (LASIX) 40 MG tablet Take 1 tablet (40 mg total) by mouth daily. (Patient taking differently: Take 20 mg by mouth daily. )  . Multiple Vitamin (MULTIVITAMIN) tablet Take 1 tablet by mouth daily.  Marland Kitchen olmesartan-hydrochlorothiazide (BENICAR HCT) 40-12.5 MG tablet Take 1 tablet by mouth daily.  Vladimir Faster Glycol-Propyl Glycol (SYSTANE OP) Place 2 drops into both eyes 4 (four) times daily as needed (dryness).   . Polyethylene Glycol 3350 (MIRALAX PO) Take  17 g by mouth daily as needed (constipation).   . potassium chloride 20 MEQ TBCR Take 20 mEq by mouth daily.     Allergies:   Patient has no known allergies.   Social History   Tobacco Use  . Smoking status: Never Smoker  . Smokeless tobacco: Never Used  Substance Use Topics  . Alcohol use: No  . Drug use: No     Family Hx: The patient's family history includes Cancer in her brother and brother; Diabetes in her brother, brother, maternal  grandmother, and mother; Hypertension in her maternal grandmother and mother.  ROS:   Please see the history of present illness.     All other systems reviewed and are negative.   Prior CV studies:   The following studies were reviewed today:  TTE 11/09/18:  1. The left ventricle has normal systolic function with an ejection fraction of 60-65%. The cavity size was normal. There is mild concentric left ventricular hypertrophy. Left ventricular diastolic Doppler parameters are consistent with impaired  relaxation. Indeterminate filling pressures No evidence of left ventricular regional wall motion abnormalities.  2. The right ventricle has normal systolic function. The cavity was normal. There is no increase in right ventricular wall thickness.  3. The mitral valve is grossly normal. There is mild mitral annular calcification present.  4. The tricuspid valve is grossly normal.  5. The aortic valve is tricuspid. Mild thickening of the aortic valve. Mild calcification of the aortic valve. Mild stenosis of the aortic valve.  6. The aortic root is normal in size and structure.  7. The inferior vena cava was dilated in size with <50% respiratory variability.  Labs/Other Tests and Data Reviewed:    EKG:  An ECG dated 02/05/19 was personally reviewed today and demonstrated:  NSR, rate 75 bpm, low voltage, RBBB   Recent Labs: 02/05/2019: B Natriuretic Peptide 535.0 02/06/2019: ALT 21; TSH 14.487 02/11/2019: Magnesium 1.8 02/12/2019: BUN 27; Creatinine, Ser 1.81; Potassium 3.8; Sodium 145 03/10/2019: Hemoglobin 9.8; Platelets 144   Recent Lipid Panel No results found for: CHOL, TRIG, HDL, CHOLHDL, LDLCALC, LDLDIRECT  Wt Readings from Last 3 Encounters:  03/30/19 115 lb (52.2 kg)  02/12/19 189 lb 9.5 oz (86 kg)  12/16/18 200 lb 6.4 oz (90.9 kg)     Objective:    Vital Signs:  BP 92/80   Pulse 88   Wt 115 lb (52.2 kg)   BMI 22.46 kg/m    VITAL SIGNS:  reviewed  ASSESSMENT & PLAN:     Chronic diastolic heart failure: Admission in October 2020 with volume overload, improved with IV diuresis.  Reports symptoms have been stable on Lasix 40 mg daily.  She states that she had recent blood work drawn by her nephrologist, will follow up results  Hypertension: On olmesartan-hydrochlorothiazide 40-12.5 mg.  She reports labile blood pressures ranging from systolic 0000000 to [id number].  Asked her to check her blood pressure daily for 1 week and call with results  Hyperlipidemia: on atorvastatin 40 mg daily  RTC in 1 month  Time:   Today, I have spent 20 minutes with the patient with telehealth technology discussing the above problems.     Medication Adjustments/Labs and Tests Ordered: Current medicines are reviewed at length with the patient today.  Concerns regarding medicines are outlined above.   Tests Ordered: No orders of the defined types were placed in this encounter.   Medication Changes: No orders of the defined types were placed in this encounter.  Follow Up:  In Person in 1 month(s)  Signed, Donato Heinz, MD  03/30/2019 11:53 AM    [place]

## 2019-03-30 NOTE — Patient Instructions (Signed)
Medication Instructions:  Your Physician recommend you continue on your current medication as directed.    *If you need a refill on your cardiac medications before your next appointment, please call your pharmacy*  Lab Work: None  Testing/Procedures: None  Follow-Up: At [facility], you and your health needs are our priority.  As part of our continuing mission to provide you with exceptional heart care, we have created designated Provider Care Teams.  These Care Teams include your primary Cardiologist (physician) and Advanced Practice Providers (APPs -  Physician Assistants and Nurse Practitioners) who all work together to provide you with the care you need, when you need it.  Your next appointment:   1 month(s)  The format for your next appointment:   In Person  Provider:   Oswaldo Milian, MD

## 2019-04-05 ENCOUNTER — Telehealth: Payer: Self-pay | Admitting: Hematology

## 2019-04-05 NOTE — Telephone Encounter (Signed)
YF PAL moved 12/23 visit to 12/29. Confirmed with patient.

## 2019-04-13 ENCOUNTER — Telehealth: Payer: Self-pay | Admitting: Hematology

## 2019-04-13 NOTE — Telephone Encounter (Signed)
YF meeting 12/29 appointments times moved earlier. Left message. Schedule mailed.

## 2019-04-21 ENCOUNTER — Ambulatory Visit: Payer: Medicare Other

## 2019-04-21 ENCOUNTER — Ambulatory Visit: Payer: Medicare Other | Admitting: Hematology

## 2019-04-21 ENCOUNTER — Other Ambulatory Visit: Payer: Medicare Other

## 2019-04-26 NOTE — Progress Notes (Signed)
[place]   Telephone:(336) [phone] Fax:(336) [phone]   Clinic Follow up Note   Patient Care Team: Leeroy Cha, MD as PCP - General (Internal Medicine) Estanislado Emms, MD as Consulting Physician (Nephrology)  Date of Service:  04/27/2019  CHIEF COMPLAINT: F/u for anemia   CURRENT THERAPY:  Aranesp injections every66month, increased to every 2 months on 12/10/19dueto worsening anemia. Increased to 3047m every 6 weeks on 10/07/18.changed to Retacrit 40K units every 6 weeks from 03/10/2019, reduce t0 20K unit every 4 weeks on 04/27/2019   INTERVAL HISTORY:  Lindsey MORONIs here for a follow up anemia. She presents to the clinic alone. She notes she is doing fair. She notes she is tolerating Aranesp injection. She notes she did not feel any change after injections.  She notes Levothyroxine  And potassium is a too large for her to swallow. She is fine to cut them in half.    REVIEW OF SYSTEMS:   Constitutional: Denies fevers, chills or abnormal weight loss Eyes: Denies blurriness of vision Ears, nose, mouth, throat, and face: Denies mucositis or sore throat Respiratory: Denies cough, dyspnea or wheezes Cardiovascular: Denies palpitation, chest discomfort or lower extremity swelling Gastrointestinal:  Denies nausea, heartburn or change in bowel habits Skin: Denies abnormal skin rashes Lymphatics: Denies new lymphadenopathy or easy bruising Neurological:Denies numbness, tingling or new weaknesses Behavioral/Psych: Mood is stable, no new changes  All other systems were reviewed with the patient and are negative.  MEDICAL HISTORY:  Past Medical History:  Diagnosis Date  . Anemia 10/2003  . Diabetes mellitus   . Hypertension     SURGICAL HISTORY: Past Surgical History:  Procedure Laterality Date  . CATARACT EXTRACTION  12/06/2003   left eye  . TUBAL LIGATION  1977  . YAG LASER APPLICATION Right 10/31/94/7591 Procedure: YAG LASER  APPLICATION;  Surgeon: MaElta Guadeloupe. ShGershon CraneMD;  Location: [facility];  Service: Ophthalmology;  Laterality: Right;    I have reviewed the social history and family history with the patient and they are unchanged from previous note.  ALLERGIES:  has No Known Allergies.  MEDICATIONS:  Current Outpatient Medications  Medication Sig Dispense Refill  . aspirin 81 MG tablet Take 81 mg by mouth daily.     . Marland Kitchentorvastatin (LIPITOR) 40 MG tablet Take 40 mg by mouth daily.    . calcium carbonate (CALCIUM 600 HIGH POTENCY) 600 MG TABS tablet Take 600 mg by mouth 2 (two) times daily with a meal.    . cholecalciferol (VITAMIN D3) 25 MCG (1000 UT) tablet Take 1,000 Units by mouth daily.    . ferrous sulfate 325 (65 FE) MG tablet Take 650 mg by mouth daily with breakfast.     . levothyroxine (SYNTHROID) 75 MCG tablet Take 75 mcg by mouth every morning.    . Multiple Vitamin (MULTIVITAMIN) tablet Take 1 tablet by mouth daily.    . Marland Kitchenlmesartan-hydrochlorothiazide (BENICAR HCT) 40-12.5 MG tablet Take 1 tablet by mouth daily.    . Vladimir Fasterlycol-Propyl Glycol (SYSTANE OP) Place 2 drops into both eyes 4 (four) times daily as needed (dryness).     . Polyethylene Glycol 3350 (MIRALAX PO) Take 17 g by mouth daily as needed (constipation).     . furosemide (LASIX) 40 MG tablet Take 1 tablet (40 mg total) by mouth daily. (Patient taking differently: Take 20 mg by mouth daily. ) 30 tablet 3  . potassium chloride 20 MEQ TBCR Take 20 mEq by mouth daily. 30[place]  tablet 3   No current facility-administered medications for this visit.    PHYSICAL EXAMINATION: ECOG PERFORMANCE STATUS: 1 - Symptomatic but completely ambulatory  Vitals:   04/27/19 1421  BP: 132/75  Pulse: 78  Resp: 20  Temp: 98.7 F (37.1 C)  SpO2: 100%   Filed Weights   04/27/19 1421  Weight: 142 lb 8 oz (64.6 kg)    Due to COVID19 we will limit examination to appearance. Patient had no complaints.  GENERAL:alert, no distress and comfortable SKIN:  skin color normal, no rashes or significant lesions EYES: normal, Conjunctiva are pink and non-injected, sclera clear  NEURO: alert & oriented x 3 with fluent speech   LABORATORY DATA:  I have reviewed the data as listed CBC Latest Ref Rng & Units 04/27/2019 03/10/2019 02/11/2019  WBC 4.0 - 10.5 K/uL 3.3(L) 3.6(L) 4.5  Hemoglobin 12.0 - 15.0 g/dL 11.2(L) 9.8(L) 9.4(L)  Hematocrit 36.0 - 46.0 % 34.8(L) 29.6(L) 29.5(L)  Platelets 150 - 400 K/uL 169 144(L) 93(L)     CMP Latest Ref Rng & Units 02/12/2019 02/11/2019 02/10/2019  Glucose 70 - 99 mg/dL 133(H) 101(H) 123(H)  BUN 8 - 23 mg/dL 27(H) 23 24(H)  Creatinine 0.44 - 1.00 mg/dL 1.81(H) 1.71(H) 1.62(H)  Sodium 135 - 145 mmol/L 145 147(H) 147(H)  Potassium 3.5 - 5.1 mmol/L 3.8 3.7 4.1  Chloride 98 - 111 mmol/L 105 107 108  CO2 22 - 32 mmol/L _0 Calcium 8.9 - 10.3 mg/dL 8.9 9.1 9.0  Total Protein 6.5 - 8.1 g/dL - - -  Total Bilirubin 0.3 - 1.2 mg/dL - - -  Alkaline Phos 38 - 126 U/L - - -  AST 15 - 41 U/L - - -  ALT 0 - 44 U/L - - -      RADIOGRAPHIC STUDIES: I have personally reviewed the radiological images as listed and agreed with the findings in the report. No results found.   ASSESSMENT & PLAN:  Lindsey Mcguire is a 84 y.o. female with   1. Anemia due to chronic renal failure treated with erythropoietin. Nunzio Cory been treated with Aranespinjection, goal Hg 9-11. Tolerating well. It was changed to Retacrit lately due to insurance issue  -Labs reviewed today, Hg 11.2, WBC 3.3. Iron panel still pending. She does not need Retacrit injection today given adequate level.  -Continue Aranesp injection now at reduced dose monthly.  -f/u in 1 month  2.Recurrent Presyncope, Fall -possibly due to drop in BP -She did have an episode of presyncope at store a week prior to starting Lasix and again in 11/2018. So I had her hold Lasix. -On 12/16/18 exam she had right sided body swelling. I advised her to f/u with her  nephrologist closely to monitor her BP. She is fine to restart Lasix to reduce her edema.  -I have recommended she ambulate with walker, especially when she leaves the house to help her balance.   3.CKD stage IV  -Sheplans to get newnephrologist in 2021 -Kidney function is stable previously   4.Hypertension, diabetes, arthritis, osteoporosis -Continue medications -F/u with PCP  5. Hypothyroidism  -Managed by endocrinologist Dr. Buddy Duty -Plans to start Synthroid medication in 2021  6.Mild leukocytopenia and thrombocytopenia -Patientpreviouslydeveloped mild intermittent leukopenia and thrombocytopenia, no significant neutropenia -Unclear etiology. If gets worse, I would consider a bone marrow biopsy to rule out MDS. -Leukopenia persists, thrombocytopenia resolved (04/27/19)  PLAN: -Labs reviewed, no need for Retacrit today -Lab and dose reduced Retacrit to 20K unit monthly X4  -  F/u in 4 months    No problem-specific Assessment & Plan notes found for this encounter.   No orders of the defined types were placed in this encounter.  All questions were answered. The patient knows to call the clinic with any problems, questions or concerns. No barriers to learning was detected. I spent 10 minutes counseling the patient face to face. The total time spent in the appointment was 15 minutes and more than 50% was on counseling and review of test results     Truitt Merle, MD 04/27/2019   I, Joslyn Devon, am acting as scribe for Truitt Merle, MD.   I have reviewed the above documentation for accuracy and completeness, and I agree with the above.

## 2019-04-27 ENCOUNTER — Ambulatory Visit: Payer: Medicare Other

## 2019-04-27 ENCOUNTER — Other Ambulatory Visit: Payer: Self-pay

## 2019-04-27 ENCOUNTER — Inpatient Hospital Stay: Payer: Medicare Other | Attending: Hematology

## 2019-04-27 ENCOUNTER — Telehealth: Payer: Self-pay | Admitting: Hematology

## 2019-04-27 ENCOUNTER — Encounter: Payer: Self-pay | Admitting: Hematology

## 2019-04-27 ENCOUNTER — Inpatient Hospital Stay: Payer: Medicare Other

## 2019-04-27 ENCOUNTER — Other Ambulatory Visit: Payer: Medicare Other

## 2019-04-27 ENCOUNTER — Ambulatory Visit: Payer: Medicare Other | Admitting: Hematology

## 2019-04-27 ENCOUNTER — Inpatient Hospital Stay: Payer: Medicare Other | Admitting: Hematology

## 2019-04-27 VITALS — BP 132/75 | HR 78 | Temp 98.7°F | Resp 20 | Ht 60.0 in | Wt 142.5 lb

## 2019-04-27 DIAGNOSIS — R55 Syncope and collapse: Secondary | ICD-10-CM | POA: Diagnosis not present

## 2019-04-27 DIAGNOSIS — D631 Anemia in chronic kidney disease: Secondary | ICD-10-CM | POA: Insufficient documentation

## 2019-04-27 DIAGNOSIS — I129 Hypertensive chronic kidney disease with stage 1 through stage 4 chronic kidney disease, or unspecified chronic kidney disease: Secondary | ICD-10-CM | POA: Diagnosis present

## 2019-04-27 DIAGNOSIS — E039 Hypothyroidism, unspecified: Secondary | ICD-10-CM | POA: Insufficient documentation

## 2019-04-27 DIAGNOSIS — Z79899 Other long term (current) drug therapy: Secondary | ICD-10-CM | POA: Insufficient documentation

## 2019-04-27 DIAGNOSIS — D696 Thrombocytopenia, unspecified: Secondary | ICD-10-CM | POA: Diagnosis not present

## 2019-04-27 DIAGNOSIS — M1990 Unspecified osteoarthritis, unspecified site: Secondary | ICD-10-CM | POA: Insufficient documentation

## 2019-04-27 DIAGNOSIS — Z7982 Long term (current) use of aspirin: Secondary | ICD-10-CM | POA: Insufficient documentation

## 2019-04-27 DIAGNOSIS — E1122 Type 2 diabetes mellitus with diabetic chronic kidney disease: Secondary | ICD-10-CM | POA: Insufficient documentation

## 2019-04-27 DIAGNOSIS — Z9181 History of falling: Secondary | ICD-10-CM | POA: Diagnosis not present

## 2019-04-27 DIAGNOSIS — N184 Chronic kidney disease, stage 4 (severe): Secondary | ICD-10-CM | POA: Insufficient documentation

## 2019-04-27 DIAGNOSIS — D72819 Decreased white blood cell count, unspecified: Secondary | ICD-10-CM | POA: Diagnosis not present

## 2019-04-27 LAB — CBC WITH DIFFERENTIAL/PLATELET
Abs Immature Granulocytes: 0.01 10*3/uL (ref 0.00–0.07)
Basophils Absolute: 0 10*3/uL (ref 0.0–0.1)
Basophils Relative: 1 %
Eosinophils Absolute: 0.1 10*3/uL (ref 0.0–0.5)
Eosinophils Relative: 2 %
HCT: 34.8 % — ABNORMAL LOW (ref 36.0–46.0)
Hemoglobin: 11.2 g/dL — ABNORMAL LOW (ref 12.0–15.0)
Immature Granulocytes: 0 %
Lymphocytes Relative: 25 %
Lymphs Abs: 0.8 10*3/uL (ref 0.7–4.0)
MCH: 27.3 pg (ref 26.0–34.0)
MCHC: 32.2 g/dL (ref 30.0–36.0)
MCV: 84.7 fL (ref 80.0–100.0)
Monocytes Absolute: 0.5 10*3/uL (ref 0.1–1.0)
Monocytes Relative: 15 %
Neutro Abs: 1.8 10*3/uL (ref 1.7–7.7)
Neutrophils Relative %: 57 %
Platelets: 169 10*3/uL (ref 150–400)
RBC: 4.11 MIL/uL (ref 3.87–5.11)
RDW: 15.9 % — ABNORMAL HIGH (ref 11.5–15.5)
WBC: 3.3 10*3/uL — ABNORMAL LOW (ref 4.0–10.5)
nRBC: 0 % (ref 0.0–0.2)

## 2019-04-27 LAB — IRON AND TIBC
Iron: 80 ug/dL (ref 41–142)
Saturation Ratios: 30 % (ref 21–57)
TIBC: 264 ug/dL (ref 236–444)
UIBC: 184 ug/dL (ref 120–384)

## 2019-04-27 LAB — FERRITIN: Ferritin: 314 ng/mL — ABNORMAL HIGH (ref 11–307)

## 2019-04-27 NOTE — Telephone Encounter (Signed)
Scheduled per 12/29 los, patient received printed calender and after visit summary.

## 2019-05-10 ENCOUNTER — Ambulatory Visit: Payer: Medicare Other | Admitting: Cardiology

## 2019-05-17 ENCOUNTER — Other Ambulatory Visit (HOSPITAL_COMMUNITY): Payer: Self-pay | Admitting: Internal Medicine

## 2019-05-17 DIAGNOSIS — Z1231 Encounter for screening mammogram for malignant neoplasm of breast: Secondary | ICD-10-CM

## 2019-05-21 ENCOUNTER — Ambulatory Visit (HOSPITAL_COMMUNITY)
Admission: RE | Admit: 2019-05-21 | Discharge: 2019-05-21 | Disposition: A | Discharge status: Home / Self Care | Payer: Medicare PPO | Source: Ambulatory Visit | Attending: Internal Medicine | Admitting: Internal Medicine

## 2019-05-21 ENCOUNTER — Other Ambulatory Visit: Payer: Self-pay

## 2019-05-21 DIAGNOSIS — Z1231 Encounter for screening mammogram for malignant neoplasm of breast: Secondary | ICD-10-CM | POA: Diagnosis present

## 2019-05-24 ENCOUNTER — Other Ambulatory Visit (HOSPITAL_COMMUNITY): Payer: Self-pay | Admitting: Internal Medicine

## 2019-05-25 ENCOUNTER — Other Ambulatory Visit (HOSPITAL_COMMUNITY): Payer: Self-pay | Admitting: Internal Medicine

## 2019-05-25 DIAGNOSIS — R928 Other abnormal and inconclusive findings on diagnostic imaging of breast: Secondary | ICD-10-CM

## 2019-05-28 ENCOUNTER — Other Ambulatory Visit: Payer: Self-pay

## 2019-05-28 ENCOUNTER — Inpatient Hospital Stay: Payer: Medicare PPO | Attending: Hematology

## 2019-05-28 ENCOUNTER — Inpatient Hospital Stay: Payer: Medicare PPO

## 2019-05-28 VITALS — BP 128/70 | HR 79 | Temp 98.0°F | Resp 18

## 2019-05-28 DIAGNOSIS — E1122 Type 2 diabetes mellitus with diabetic chronic kidney disease: Secondary | ICD-10-CM | POA: Diagnosis not present

## 2019-05-28 DIAGNOSIS — N184 Chronic kidney disease, stage 4 (severe): Secondary | ICD-10-CM | POA: Insufficient documentation

## 2019-05-28 DIAGNOSIS — I129 Hypertensive chronic kidney disease with stage 1 through stage 4 chronic kidney disease, or unspecified chronic kidney disease: Secondary | ICD-10-CM | POA: Insufficient documentation

## 2019-05-28 DIAGNOSIS — D696 Thrombocytopenia, unspecified: Secondary | ICD-10-CM | POA: Insufficient documentation

## 2019-05-28 DIAGNOSIS — D631 Anemia in chronic kidney disease: Secondary | ICD-10-CM

## 2019-05-28 DIAGNOSIS — N183 Chronic kidney disease, stage 3 unspecified: Secondary | ICD-10-CM

## 2019-05-28 LAB — CBC WITH DIFFERENTIAL/PLATELET
Abs Immature Granulocytes: 0.01 10*3/uL (ref 0.00–0.07)
Basophils Absolute: 0 10*3/uL (ref 0.0–0.1)
Basophils Relative: 1 %
Eosinophils Absolute: 0.1 10*3/uL (ref 0.0–0.5)
Eosinophils Relative: 2 %
HCT: 32.8 % — ABNORMAL LOW (ref 36.0–46.0)
Hemoglobin: 10.6 g/dL — ABNORMAL LOW (ref 12.0–15.0)
Immature Granulocytes: 0 %
Lymphocytes Relative: 22 %
Lymphs Abs: 0.9 10*3/uL (ref 0.7–4.0)
MCH: 27.2 pg (ref 26.0–34.0)
MCHC: 32.3 g/dL (ref 30.0–36.0)
MCV: 84.1 fL (ref 80.0–100.0)
Monocytes Absolute: 0.7 10*3/uL (ref 0.1–1.0)
Monocytes Relative: 17 %
Neutro Abs: 2.4 10*3/uL (ref 1.7–7.7)
Neutrophils Relative %: 58 %
Platelets: 126 10*3/uL — ABNORMAL LOW (ref 150–400)
RBC: 3.9 MIL/uL (ref 3.87–5.11)
RDW: 15.6 % — ABNORMAL HIGH (ref 11.5–15.5)
WBC: 4.1 10*3/uL (ref 4.0–10.5)
nRBC: 0 % (ref 0.0–0.2)

## 2019-05-28 MED ORDER — EPOETIN ALFA-EPBX 10000 UNIT/ML IJ SOLN
INTRAMUSCULAR | Status: AC
  Filled 2019-05-28: qty 2

## 2019-05-28 MED ORDER — EPOETIN ALFA-EPBX 10000 UNIT/ML IJ SOLN
20000.0000 [IU] | Freq: Once | INTRAMUSCULAR | Status: AC
  Administered 2019-05-28: 13:00:00 20000 [IU] via SUBCUTANEOUS

## 2019-05-28 NOTE — Patient Instructions (Signed)

## 2019-06-08 ENCOUNTER — Ambulatory Visit (HOSPITAL_COMMUNITY): Admission: RE | Admit: 2019-06-08 | Payer: Medicare PPO | Source: Ambulatory Visit

## 2019-06-08 ENCOUNTER — Other Ambulatory Visit: Payer: Self-pay

## 2019-06-08 ENCOUNTER — Ambulatory Visit (HOSPITAL_COMMUNITY)
Admission: RE | Admit: 2019-06-08 | Discharge: 2019-06-08 | Disposition: A | Discharge status: Home / Self Care | Payer: Medicare PPO | Source: Ambulatory Visit | Attending: Internal Medicine | Admitting: Internal Medicine

## 2019-06-08 DIAGNOSIS — R928 Other abnormal and inconclusive findings on diagnostic imaging of breast: Secondary | ICD-10-CM

## 2019-06-25 ENCOUNTER — Other Ambulatory Visit: Payer: Self-pay

## 2019-06-25 ENCOUNTER — Inpatient Hospital Stay: Payer: Medicare PPO | Attending: Hematology

## 2019-06-25 ENCOUNTER — Inpatient Hospital Stay: Payer: Medicare PPO

## 2019-06-25 DIAGNOSIS — D631 Anemia in chronic kidney disease: Secondary | ICD-10-CM | POA: Diagnosis present

## 2019-06-25 DIAGNOSIS — N184 Chronic kidney disease, stage 4 (severe): Secondary | ICD-10-CM | POA: Insufficient documentation

## 2019-06-25 LAB — CBC WITH DIFFERENTIAL/PLATELET
Abs Immature Granulocytes: 0 10*3/uL (ref 0.00–0.07)
Basophils Absolute: 0 10*3/uL (ref 0.0–0.1)
Basophils Relative: 1 %
Eosinophils Absolute: 0.1 10*3/uL (ref 0.0–0.5)
Eosinophils Relative: 3 %
HCT: 35.6 % — ABNORMAL LOW (ref 36.0–46.0)
Hemoglobin: 11.5 g/dL — ABNORMAL LOW (ref 12.0–15.0)
Immature Granulocytes: 0 %
Lymphocytes Relative: 27 %
Lymphs Abs: 0.9 10*3/uL (ref 0.7–4.0)
MCH: 27.2 pg (ref 26.0–34.0)
MCHC: 32.3 g/dL (ref 30.0–36.0)
MCV: 84.2 fL (ref 80.0–100.0)
Monocytes Absolute: 0.6 10*3/uL (ref 0.1–1.0)
Monocytes Relative: 17 %
Neutro Abs: 1.7 10*3/uL (ref 1.7–7.7)
Neutrophils Relative %: 52 %
Platelets: 130 10*3/uL — ABNORMAL LOW (ref 150–400)
RBC: 4.23 MIL/uL (ref 3.87–5.11)
RDW: 15.2 % (ref 11.5–15.5)
WBC: 3.3 10*3/uL — ABNORMAL LOW (ref 4.0–10.5)
nRBC: 0 % (ref 0.0–0.2)

## 2019-06-25 NOTE — Progress Notes (Signed)
Pt did not meet parameters for inject. Hgb 11.5. Labs were given to pt. Advised pt if there were any concerns to contact office. Pt was appreciative and verbalized understanding.

## 2019-07-02 IMAGING — MG DIGITAL SCREENING BILATERAL MAMMOGRAM WITH TOMO AND CAD
6 of 12 series · 6 of 36 positions shown · non-contrast
Comparison: Previous exam(s).

CLINICAL DATA: Screening.

EXAM:
DIGITAL SCREENING BILATERAL MAMMOGRAM WITH TOMO AND CAD

[L MLO synth-2D (1 of 2)]
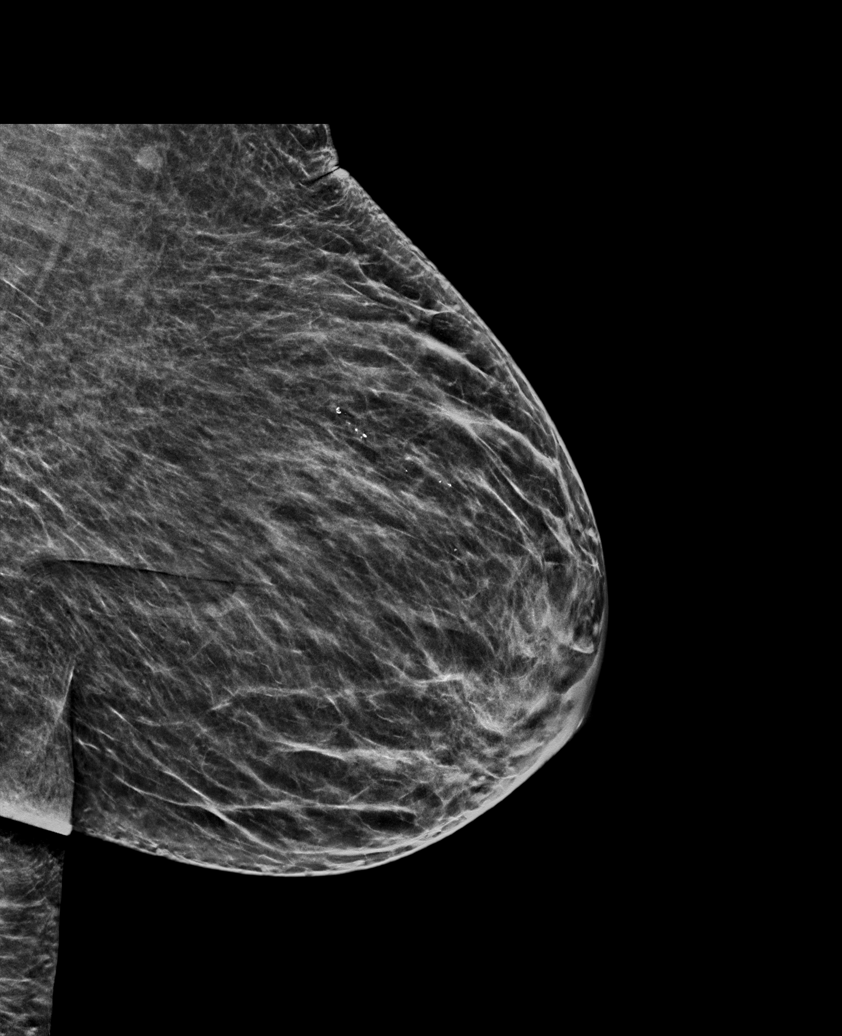

[L CC synth-2D]
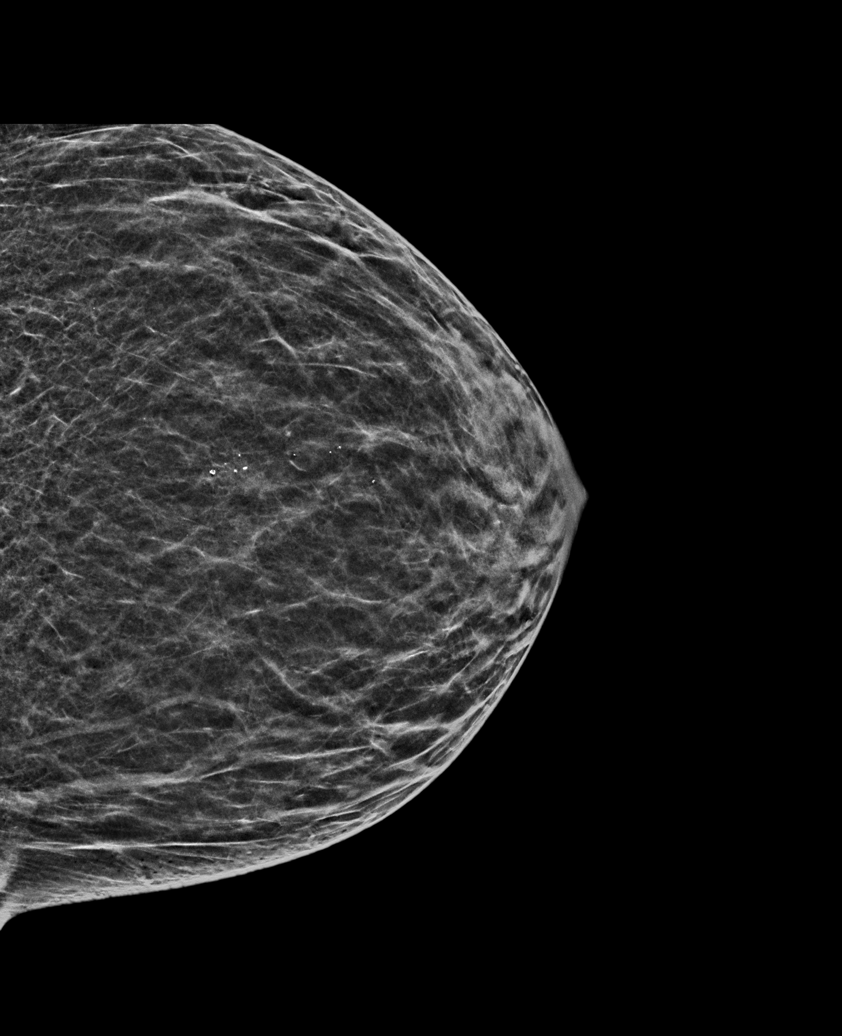

[R CC synth-2D (1 of 2)]
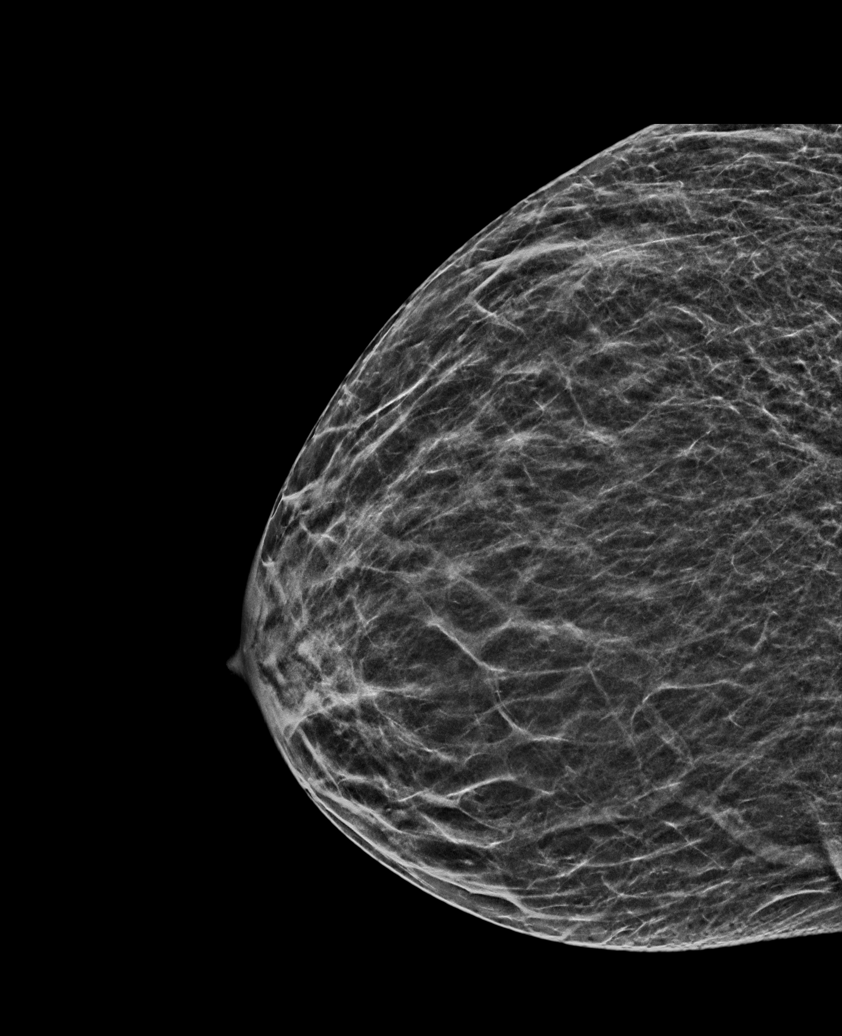

[R CC synth-2D (2 of 2)]
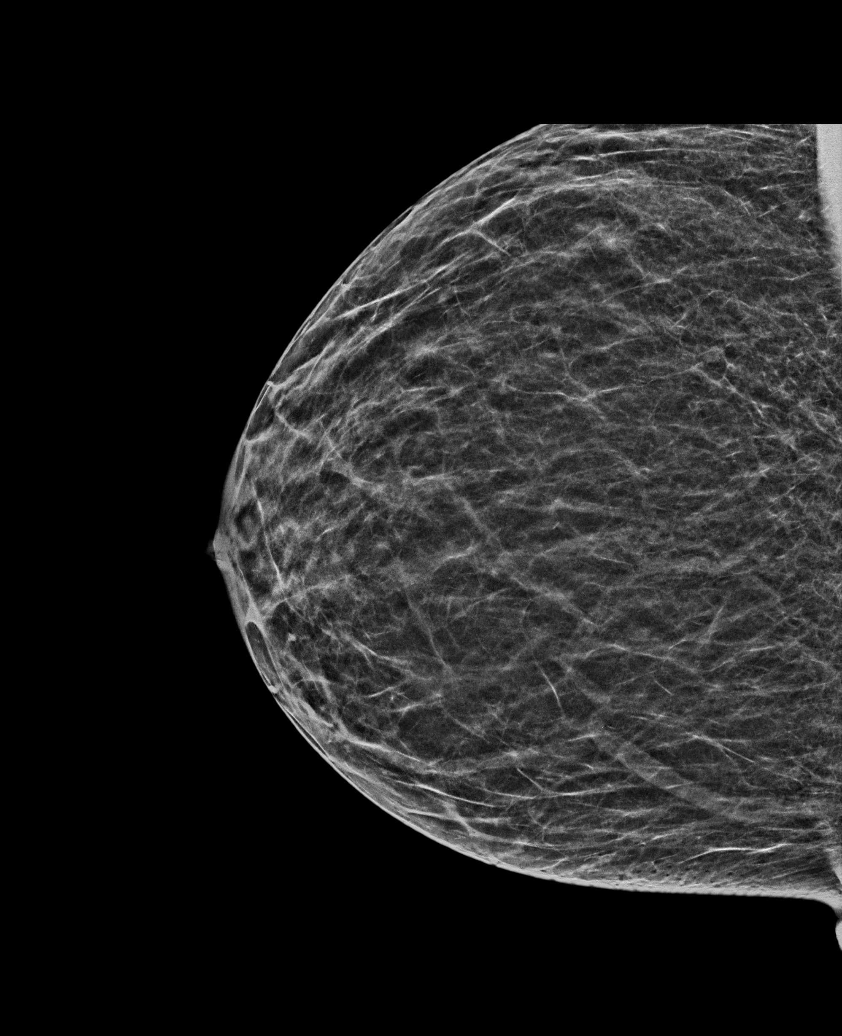

[R MLO synth-2D]
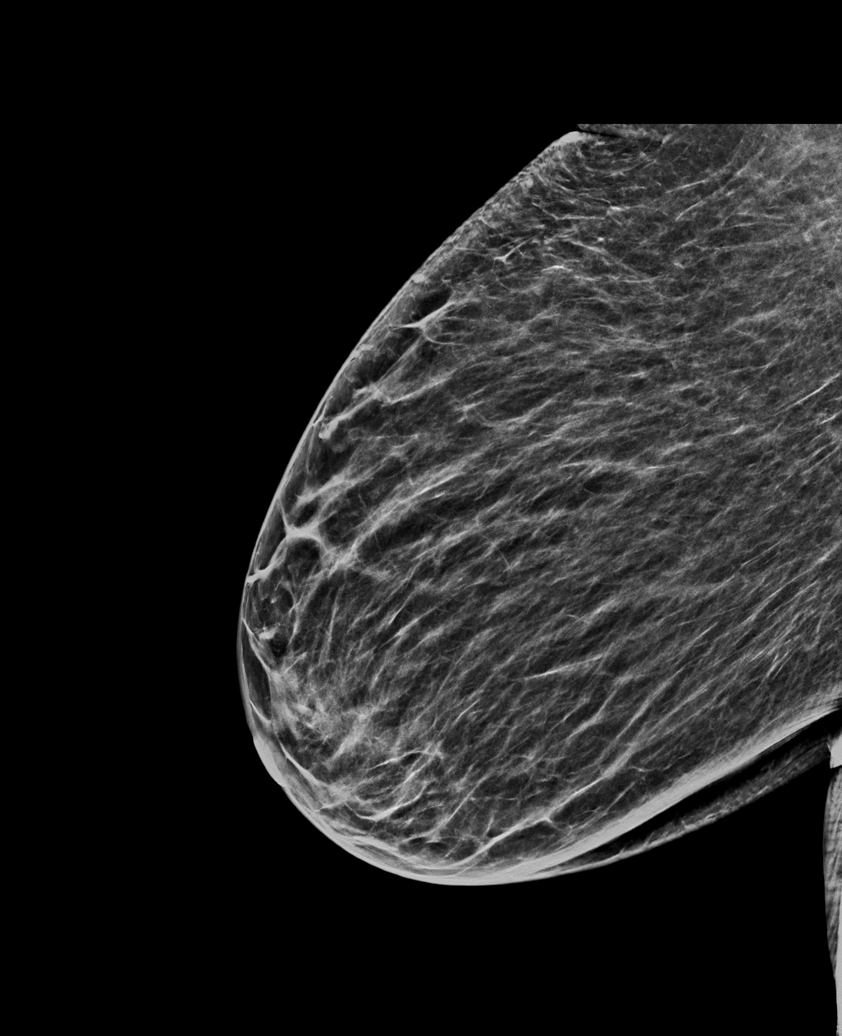

[L MLO synth-2D (2 of 2)]
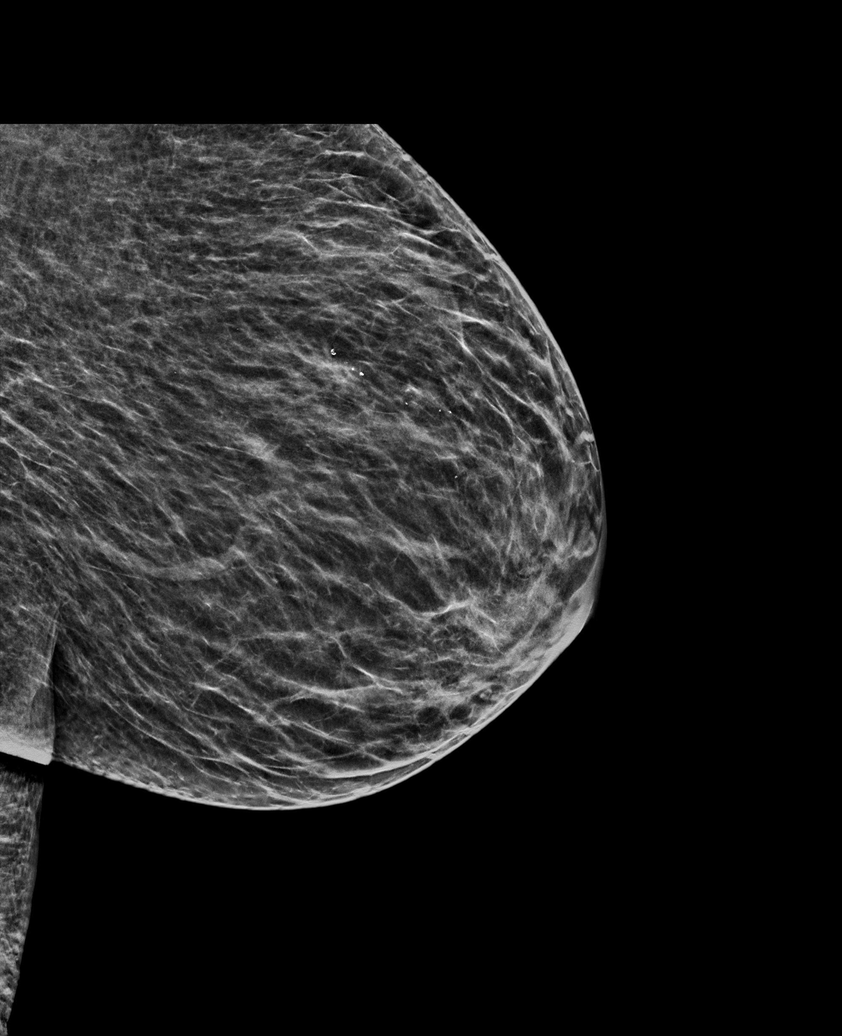

[6 of 36 positions shown; findings below may reference images not displayed]

ACR Breast Density Category c: The breast tissue is heterogeneously
dense, which may obscure small masses.
FINDINGS: There are no findings suspicious for malignancy. Images were
processed with CAD.
IMPRESSION: No mammographic evidence of malignancy. A result letter of this
screening mammogram will be mailed directly to the patient.

RECOMMENDATION:
Screening mammogram in one year. (Code:FT-[id number])

BI-RADS CATEGORY  1: Negative.

## 2019-07-08 ENCOUNTER — Encounter: Payer: Self-pay | Admitting: Cardiology

## 2019-07-23 ENCOUNTER — Inpatient Hospital Stay: Payer: Medicare PPO | Attending: Hematology

## 2019-07-23 ENCOUNTER — Inpatient Hospital Stay: Payer: Medicare PPO

## 2019-07-23 ENCOUNTER — Other Ambulatory Visit: Payer: Self-pay

## 2019-07-23 VITALS — BP 108/63 | HR 81 | Temp 98.7°F | Resp 18

## 2019-07-23 DIAGNOSIS — D631 Anemia in chronic kidney disease: Secondary | ICD-10-CM

## 2019-07-23 DIAGNOSIS — I129 Hypertensive chronic kidney disease with stage 1 through stage 4 chronic kidney disease, or unspecified chronic kidney disease: Secondary | ICD-10-CM | POA: Insufficient documentation

## 2019-07-23 DIAGNOSIS — N184 Chronic kidney disease, stage 4 (severe): Secondary | ICD-10-CM

## 2019-07-23 DIAGNOSIS — E1122 Type 2 diabetes mellitus with diabetic chronic kidney disease: Secondary | ICD-10-CM | POA: Diagnosis not present

## 2019-07-23 LAB — CBC WITH DIFFERENTIAL/PLATELET
Abs Immature Granulocytes: 0.01 10*3/uL (ref 0.00–0.07)
Basophils Absolute: 0 10*3/uL (ref 0.0–0.1)
Basophils Relative: 1 %
Eosinophils Absolute: 0.2 10*3/uL (ref 0.0–0.5)
Eosinophils Relative: 5 %
HCT: 31.7 % — ABNORMAL LOW (ref 36.0–46.0)
Hemoglobin: 10.3 g/dL — ABNORMAL LOW (ref 12.0–15.0)
Immature Granulocytes: 0 %
Lymphocytes Relative: 21 %
Lymphs Abs: 0.8 10*3/uL (ref 0.7–4.0)
MCH: 27.5 pg (ref 26.0–34.0)
MCHC: 32.5 g/dL (ref 30.0–36.0)
MCV: 84.5 fL (ref 80.0–100.0)
Monocytes Absolute: 0.7 10*3/uL (ref 0.1–1.0)
Monocytes Relative: 17 %
Neutro Abs: 2.2 10*3/uL (ref 1.7–7.7)
Neutrophils Relative %: 56 %
Platelets: 130 10*3/uL — ABNORMAL LOW (ref 150–400)
RBC: 3.75 MIL/uL — ABNORMAL LOW (ref 3.87–5.11)
RDW: 14.7 % (ref 11.5–15.5)
WBC: 3.9 10*3/uL — ABNORMAL LOW (ref 4.0–10.5)
nRBC: 0 % (ref 0.0–0.2)

## 2019-07-23 LAB — FERRITIN: Ferritin: 491 ng/mL — ABNORMAL HIGH (ref 11–307)

## 2019-07-23 MED ORDER — EPOETIN ALFA-EPBX 10000 UNIT/ML IJ SOLN
INTRAMUSCULAR | Status: AC
  Filled 2019-07-23: qty 2

## 2019-07-23 MED ORDER — EPOETIN ALFA-EPBX 10000 UNIT/ML IJ SOLN
20000.0000 [IU] | Freq: Once | INTRAMUSCULAR | Status: AC
  Administered 2019-07-23: 20000 [IU] via SUBCUTANEOUS

## 2019-07-23 NOTE — Patient Instructions (Signed)

## 2019-08-25 NOTE — Progress Notes (Signed)
[facility]   Telephone:(336) [phone] Fax:(336) [phone]   Clinic Follow up Note   Patient Care Team: Leeroy Cha, MD as PCP - General (Internal Medicine) Estanislado Emms, MD as Consulting Physician (Nephrology)  Date of Service:  08/26/2019  CHIEF COMPLAINT: F/u for anemia  CURRENT THERAPY:  Aranesp injections every40month, increased to every 2 months on 12/10/19dueto worsening anemia. Increased to 3061m every 6 weeks on 10/07/18.changed to Retacrit 40K units every 6 weeks from 03/10/2019, reduce to 20K unit every 4 weeks on 04/27/2019    INTERVAL HISTORY:  Lindsey KATSs here for a follow up of anemia. She presents to the clinic alone.  She is clinically doing well, with good appetite and energy level, functions well at home.  She denies any significant pain or other new symptoms.   MEDICAL HISTORY:  Past Medical History:  Diagnosis Date  . Anemia 10/2003  . Diabetes mellitus   . Hypertension     SURGICAL HISTORY: Past Surgical History:  Procedure Laterality Date  . CATARACT EXTRACTION  12/06/2003   left eye  . TUBAL LIGATION  1977  . YAG LASER APPLICATION Right 11/05/22/2683 Procedure: YAG LASER APPLICATION;  Surgeon: MaElta Guadeloupe. ShGershon CraneMD;  Location: [facility];  Service: Ophthalmology;  Laterality: Right;    I have reviewed the social history and family history with the patient and they are unchanged from previous note.  ALLERGIES:  has No Known Allergies.  MEDICATIONS:  Current Outpatient Medications  Medication Sig Dispense Refill  . olmesartan (BENICAR) 40 MG tablet Take 40 mg by mouth daily.    . Marland Kitchenspirin 81 MG tablet Take 81 mg by mouth daily.     . Marland Kitchentorvastatin (LIPITOR) 40 MG tablet Take 40 mg by mouth daily.    . calcium carbonate (CALCIUM 600 HIGH POTENCY) 600 MG TABS tablet Take 600 mg by mouth 2 (two) times daily with a meal.    . cholecalciferol (VITAMIN D3) 25 MCG (1000 UT) tablet Take 1,000 Units by mouth daily.    .  ferrous sulfate 325 (65 FE) MG tablet Take 650 mg by mouth daily with breakfast.     . levothyroxine (SYNTHROID) 75 MCG tablet Take 75 mcg by mouth every morning.    . Multiple Vitamin (MULTIVITAMIN) tablet Take 1 tablet by mouth daily.    . Vladimir Fasterlycol-Propyl Glycol (SYSTANE OP) Place 2 drops into both eyes 4 (four) times daily as needed (dryness).     . Polyethylene Glycol 3350 (MIRALAX PO) Take 17 g by mouth daily as needed (constipation).     . potassium chloride 20 MEQ TBCR Take 20 mEq by mouth daily. 30 tablet 3   No current facility-administered medications for this visit.   Facility-Administered Medications Ordered in Other Visits  Medication Dose Route Frequency Provider Last Rate Last Admin  . epoetin alfa-epbx (RETACRIT) injection 20,000 Units  20,000 Units Subcutaneous Once FeTruitt MerleMD        PHYSICAL EXAMINATION: ECOG PERFORMANCE STATUS: 1 - Symptomatic but completely ambulatory  Vitals:   08/26/19 1400  BP: 136/74  Pulse: 79  Resp: 18  Temp: 98.3 F (36.8 C)  SpO2: 100%   Filed Weights   08/26/19 1400  Weight: 145 lb 6.4 oz (66 kg)    GENERAL:alert, no distress and comfortable SKIN: skin color, texture, turgor are normal, no rashes or significant lesions EYES: normal, Conjunctiva are pink and non-injected, sclera clear NECK: supple, thyroid normal size, non-tender, without nodularity LYMPH:  no palpable lymphadenopathy in the cervical, axillary  LUNGS: clear to auscultation and percussion with normal breathing effort HEART: regular rate & rhythm and no murmurs and no lower extremity edema ABDOMEN:abdomen soft, non-tender and normal bowel sounds Musculoskeletal:no cyanosis of digits and no clubbing  NEURO: alert & oriented x 3 with fluent speech, no focal motor/sensory deficits  LABORATORY DATA:  I have reviewed the data as listed CBC Latest Ref Rng & Units 08/26/2019 07/23/2019 06/25/2019  WBC 4.0 - 10.5 K/uL 3.1(L) 3.9(L) 3.3(L)  Hemoglobin 12.0 - 15.0  g/dL 10.7(L) 10.3(L) 11.5(L)  Hematocrit 36.0 - 46.0 % 33.0(L) 31.7(L) 35.6(L)  Platelets 150 - 400 K/uL 130(L) 130(L) 130(L)     CMP Latest Ref Rng & Units 02/12/2019 02/11/2019 02/10/2019  Glucose 70 - 99 mg/dL 133(H) 101(H) 123(H)  BUN 8 - 23 mg/dL 27(H) 23 24(H)  Creatinine 0.44 - 1.00 mg/dL 1.81(H) 1.71(H) 1.62(H)  Sodium 135 - 145 mmol/L 145 147(H) 147(H)  Potassium 3.5 - 5.1 mmol/L 3.8 3.7 4.1  Chloride 98 - 111 mmol/L 105 107 108  CO2 22 - 32 mmol/L _0 Calcium 8.9 - 10.3 mg/dL 8.9 9.1 9.0  Total Protein 6.5 - 8.1 g/dL - - -  Total Bilirubin 0.3 - 1.2 mg/dL - - -  Alkaline Phos 38 - 126 U/L - - -  AST 15 - 41 U/L - - -  ALT 0 - 44 U/L - - -      RADIOGRAPHIC STUDIES: I have personally reviewed the radiological images as listed and agreed with the findings in the report. No results found.   ASSESSMENT & PLAN:  Lindsey Mcguire is a 85 y.o. female with    1. Anemia due to chronic renal failure treated with erythropoietin. Nunzio Cory been treated with Aranespinjection, goal Hg 9-11. Tolerating well. It was changed to Retacrit from 03/10/19 due to insurance issue. Currently on every 4 weeks injection.  -Labs reviewed today, Hg 10.7. will proceed Retacrit injection today -Continue Aranesp injection, will reduce to every 6 weeks give her good response and trending up Hg  -f/u in 18 weeks   2.HTN  -continue meds and f/u with PCP   3.CKD stage IV  -f/u withnephrologist  -Kidney function is stablelately   4.diabetes, arthritis, osteoporosis -Continue medications -F/u with PCP  5. Hypothyroidism  -Managed by endocrinologist Dr. Buddy Duty -on Levothyroxine   6.Mild leukocytopenia and thrombocytopenia -overall mild and stable  -Unclear etiology. If gets worse, I would consider a bone marrow biopsy to rule out MDS.   PLAN: -Labsreviewed, will proceed with Retacrit today -Lab and Retacrit to 20K unit every 6 weeks   -F/u in 18 weeks   No  problem-specific Assessment & Plan notes found for this encounter.   No orders of the defined types were placed in this encounter.  All questions were answered. The patient knows to call the clinic with any problems, questions or concerns. No barriers to learning was detected. The total time spent in the appointment was 20 minutes.     Truitt Merle, MD 08/26/2019   I, Joslyn Devon, am acting as scribe for Truitt Merle, MD.   I have reviewed the above documentation for accuracy and completeness, and I agree with the above.

## 2019-08-26 ENCOUNTER — Encounter (INDEPENDENT_AMBULATORY_CARE_PROVIDER_SITE_OTHER): Payer: Self-pay

## 2019-08-26 ENCOUNTER — Inpatient Hospital Stay: Payer: Medicare PPO | Attending: Hematology

## 2019-08-26 ENCOUNTER — Inpatient Hospital Stay (HOSPITAL_BASED_OUTPATIENT_CLINIC_OR_DEPARTMENT_OTHER): Payer: Medicare PPO | Admitting: Hematology

## 2019-08-26 ENCOUNTER — Inpatient Hospital Stay: Payer: Medicare PPO

## 2019-08-26 ENCOUNTER — Encounter: Payer: Self-pay | Admitting: Hematology

## 2019-08-26 ENCOUNTER — Other Ambulatory Visit: Payer: Self-pay

## 2019-08-26 VITALS — BP 136/74 | HR 79 | Temp 98.3°F | Resp 18 | Ht 60.0 in | Wt 145.4 lb

## 2019-08-26 DIAGNOSIS — D631 Anemia in chronic kidney disease: Secondary | ICD-10-CM | POA: Diagnosis not present

## 2019-08-26 DIAGNOSIS — I129 Hypertensive chronic kidney disease with stage 1 through stage 4 chronic kidney disease, or unspecified chronic kidney disease: Secondary | ICD-10-CM | POA: Diagnosis not present

## 2019-08-26 DIAGNOSIS — D72819 Decreased white blood cell count, unspecified: Secondary | ICD-10-CM | POA: Diagnosis not present

## 2019-08-26 DIAGNOSIS — E1122 Type 2 diabetes mellitus with diabetic chronic kidney disease: Secondary | ICD-10-CM | POA: Insufficient documentation

## 2019-08-26 DIAGNOSIS — E039 Hypothyroidism, unspecified: Secondary | ICD-10-CM | POA: Diagnosis not present

## 2019-08-26 DIAGNOSIS — M1990 Unspecified osteoarthritis, unspecified site: Secondary | ICD-10-CM | POA: Insufficient documentation

## 2019-08-26 DIAGNOSIS — N184 Chronic kidney disease, stage 4 (severe): Secondary | ICD-10-CM

## 2019-08-26 DIAGNOSIS — D696 Thrombocytopenia, unspecified: Secondary | ICD-10-CM | POA: Diagnosis not present

## 2019-08-26 DIAGNOSIS — M810 Age-related osteoporosis without current pathological fracture: Secondary | ICD-10-CM | POA: Diagnosis not present

## 2019-08-26 LAB — CBC WITH DIFFERENTIAL/PLATELET
Abs Immature Granulocytes: 0.02 10*3/uL (ref 0.00–0.07)
Basophils Absolute: 0 10*3/uL (ref 0.0–0.1)
Basophils Relative: 1 %
Eosinophils Absolute: 0.1 10*3/uL (ref 0.0–0.5)
Eosinophils Relative: 3 %
HCT: 33 % — ABNORMAL LOW (ref 36.0–46.0)
Hemoglobin: 10.7 g/dL — ABNORMAL LOW (ref 12.0–15.0)
Immature Granulocytes: 1 %
Lymphocytes Relative: 27 %
Lymphs Abs: 0.8 10*3/uL (ref 0.7–4.0)
MCH: 27.5 pg (ref 26.0–34.0)
MCHC: 32.4 g/dL (ref 30.0–36.0)
MCV: 84.8 fL (ref 80.0–100.0)
Monocytes Absolute: 0.5 10*3/uL (ref 0.1–1.0)
Monocytes Relative: 16 %
Neutro Abs: 1.6 10*3/uL — ABNORMAL LOW (ref 1.7–7.7)
Neutrophils Relative %: 52 %
Platelets: 130 10*3/uL — ABNORMAL LOW (ref 150–400)
RBC: 3.89 MIL/uL (ref 3.87–5.11)
RDW: 14.4 % (ref 11.5–15.5)
WBC: 3.1 10*3/uL — ABNORMAL LOW (ref 4.0–10.5)
nRBC: 0 % (ref 0.0–0.2)

## 2019-08-26 MED ORDER — EPOETIN ALFA-EPBX 10000 UNIT/ML IJ SOLN
INTRAMUSCULAR | Status: AC
  Filled 2019-08-26: qty 2

## 2019-08-26 MED ORDER — EPOETIN ALFA-EPBX 10000 UNIT/ML IJ SOLN: 20000.0000 [IU] | Freq: Once | INTRAMUSCULAR | Status: AC

## 2019-08-26 NOTE — Progress Notes (Unsigned)
Patient was arrived to flush at 2:34pn. Went to look for patient for 45 minutes and she was not in either waiting area. Also checked bathrooms. Called patients home phone and no answer. Checked with Dr Lewayne Bunting nurse Ihor Gully RN to see if anyone let her know about her injection appointment and got no response from her. Patient no showed and injection not given

## 2019-08-27 ENCOUNTER — Telehealth: Payer: Self-pay | Admitting: Hematology

## 2019-08-27 NOTE — Telephone Encounter (Signed)
Scheduled appt per 4/29 los.  Printed and mailed appt calendar

## 2019-09-30 DIAGNOSIS — E039 Hypothyroidism, unspecified: Secondary | ICD-10-CM | POA: Diagnosis not present

## 2019-10-05 DIAGNOSIS — Z961 Presence of intraocular lens: Secondary | ICD-10-CM | POA: Diagnosis not present

## 2019-10-05 DIAGNOSIS — H52203 Unspecified astigmatism, bilateral: Secondary | ICD-10-CM | POA: Diagnosis not present

## 2019-10-05 DIAGNOSIS — H524 Presbyopia: Secondary | ICD-10-CM | POA: Diagnosis not present

## 2019-10-05 DIAGNOSIS — E119 Type 2 diabetes mellitus without complications: Secondary | ICD-10-CM | POA: Diagnosis not present

## 2019-10-07 ENCOUNTER — Inpatient Hospital Stay: Payer: Medicare PPO | Attending: Hematology

## 2019-10-07 ENCOUNTER — Other Ambulatory Visit: Payer: Self-pay

## 2019-10-07 ENCOUNTER — Inpatient Hospital Stay: Payer: Medicare PPO

## 2019-10-07 VITALS — BP 126/66 | Temp 98.6°F

## 2019-10-07 DIAGNOSIS — D631 Anemia in chronic kidney disease: Secondary | ICD-10-CM | POA: Diagnosis not present

## 2019-10-07 DIAGNOSIS — E1122 Type 2 diabetes mellitus with diabetic chronic kidney disease: Secondary | ICD-10-CM | POA: Diagnosis not present

## 2019-10-07 DIAGNOSIS — I129 Hypertensive chronic kidney disease with stage 1 through stage 4 chronic kidney disease, or unspecified chronic kidney disease: Secondary | ICD-10-CM | POA: Insufficient documentation

## 2019-10-07 DIAGNOSIS — N184 Chronic kidney disease, stage 4 (severe): Secondary | ICD-10-CM

## 2019-10-07 LAB — CBC WITH DIFFERENTIAL/PLATELET
Abs Immature Granulocytes: 0.01 10*3/uL (ref 0.00–0.07)
Basophils Absolute: 0 10*3/uL (ref 0.0–0.1)
Basophils Relative: 1 %
Eosinophils Absolute: 0.1 10*3/uL (ref 0.0–0.5)
Eosinophils Relative: 3 %
HCT: 32.7 % — ABNORMAL LOW (ref 36.0–46.0)
Hemoglobin: 10.4 g/dL — ABNORMAL LOW (ref 12.0–15.0)
Immature Granulocytes: 0 %
Lymphocytes Relative: 23 %
Lymphs Abs: 0.9 10*3/uL (ref 0.7–4.0)
MCH: 27.7 pg (ref 26.0–34.0)
MCHC: 31.8 g/dL (ref 30.0–36.0)
MCV: 87.2 fL (ref 80.0–100.0)
Monocytes Absolute: 0.6 10*3/uL (ref 0.1–1.0)
Monocytes Relative: 15 %
Neutro Abs: 2.2 10*3/uL (ref 1.7–7.7)
Neutrophils Relative %: 58 %
Platelets: 134 10*3/uL — ABNORMAL LOW (ref 150–400)
RBC: 3.75 MIL/uL — ABNORMAL LOW (ref 3.87–5.11)
RDW: 14.5 % (ref 11.5–15.5)
WBC: 3.8 10*3/uL — ABNORMAL LOW (ref 4.0–10.5)
nRBC: 0 % (ref 0.0–0.2)

## 2019-10-07 LAB — FERRITIN: Ferritin: 363 ng/mL — ABNORMAL HIGH (ref 11–307)

## 2019-10-07 LAB — IRON AND TIBC
Iron: 88 ug/dL (ref 41–142)
Saturation Ratios: 33 % (ref 21–57)
TIBC: 265 ug/dL (ref 236–444)
UIBC: 177 ug/dL (ref 120–384)

## 2019-10-07 MED ORDER — EPOETIN ALFA-EPBX 10000 UNIT/ML IJ SOLN
INTRAMUSCULAR | Status: AC
  Filled 2019-10-07: qty 2

## 2019-10-07 MED ORDER — EPOETIN ALFA-EPBX 10000 UNIT/ML IJ SOLN
20000.0000 [IU] | Freq: Once | INTRAMUSCULAR | Status: AC
  Administered 2019-10-07: 20000 [IU] via SUBCUTANEOUS

## 2019-10-07 NOTE — Patient Instructions (Signed)

## 2019-11-18 ENCOUNTER — Other Ambulatory Visit: Payer: Self-pay

## 2019-11-18 ENCOUNTER — Inpatient Hospital Stay: Payer: Medicare PPO

## 2019-11-18 ENCOUNTER — Inpatient Hospital Stay: Payer: Medicare PPO | Attending: Hematology

## 2019-11-18 VITALS — BP 132/68

## 2019-11-18 DIAGNOSIS — N184 Chronic kidney disease, stage 4 (severe): Secondary | ICD-10-CM | POA: Diagnosis not present

## 2019-11-18 DIAGNOSIS — I129 Hypertensive chronic kidney disease with stage 1 through stage 4 chronic kidney disease, or unspecified chronic kidney disease: Secondary | ICD-10-CM | POA: Insufficient documentation

## 2019-11-18 DIAGNOSIS — D631 Anemia in chronic kidney disease: Secondary | ICD-10-CM

## 2019-11-18 LAB — CBC WITH DIFFERENTIAL/PLATELET
Abs Immature Granulocytes: 0.02 10*3/uL (ref 0.00–0.07)
Basophils Absolute: 0 10*3/uL (ref 0.0–0.1)
Basophils Relative: 1 %
Eosinophils Absolute: 0.1 10*3/uL (ref 0.0–0.5)
Eosinophils Relative: 4 %
HCT: 34.6 % — ABNORMAL LOW (ref 36.0–46.0)
Hemoglobin: 11 g/dL — ABNORMAL LOW (ref 12.0–15.0)
Immature Granulocytes: 1 %
Lymphocytes Relative: 22 %
Lymphs Abs: 0.8 10*3/uL (ref 0.7–4.0)
MCH: 27.8 pg (ref 26.0–34.0)
MCHC: 31.8 g/dL (ref 30.0–36.0)
MCV: 87.4 fL (ref 80.0–100.0)
Monocytes Absolute: 0.5 10*3/uL (ref 0.1–1.0)
Monocytes Relative: 14 %
Neutro Abs: 2 10*3/uL (ref 1.7–7.7)
Neutrophils Relative %: 58 %
Platelets: 136 10*3/uL — ABNORMAL LOW (ref 150–400)
RBC: 3.96 MIL/uL (ref 3.87–5.11)
RDW: 14 % (ref 11.5–15.5)
WBC: 3.4 10*3/uL — ABNORMAL LOW (ref 4.0–10.5)
nRBC: 0 % (ref 0.0–0.2)

## 2019-11-18 MED ORDER — EPOETIN ALFA-EPBX 40000 UNIT/ML IJ SOLN: 20000.0000 [IU] | Freq: Once | INTRAMUSCULAR | Status: DC

## 2019-11-18 MED ORDER — EPOETIN ALFA-EPBX 10000 UNIT/ML IJ SOLN
INTRAMUSCULAR | Status: AC
  Filled 2019-11-18: qty 2

## 2019-11-18 NOTE — Progress Notes (Signed)
Per parameters, retacrit not needed today. Hgb=11.0, gave pt a copy of labs

## 2019-12-22 DIAGNOSIS — E039 Hypothyroidism, unspecified: Secondary | ICD-10-CM | POA: Diagnosis not present

## 2019-12-30 ENCOUNTER — Inpatient Hospital Stay: Payer: Medicare PPO | Admitting: Hematology

## 2019-12-30 ENCOUNTER — Inpatient Hospital Stay: Payer: Medicare PPO

## 2019-12-30 ENCOUNTER — Inpatient Hospital Stay: Payer: Medicare PPO | Attending: Hematology

## 2019-12-30 DIAGNOSIS — I129 Hypertensive chronic kidney disease with stage 1 through stage 4 chronic kidney disease, or unspecified chronic kidney disease: Secondary | ICD-10-CM | POA: Insufficient documentation

## 2019-12-30 DIAGNOSIS — M1990 Unspecified osteoarthritis, unspecified site: Secondary | ICD-10-CM | POA: Insufficient documentation

## 2019-12-30 DIAGNOSIS — D631 Anemia in chronic kidney disease: Secondary | ICD-10-CM | POA: Insufficient documentation

## 2019-12-30 DIAGNOSIS — D696 Thrombocytopenia, unspecified: Secondary | ICD-10-CM | POA: Insufficient documentation

## 2019-12-30 DIAGNOSIS — Z7982 Long term (current) use of aspirin: Secondary | ICD-10-CM | POA: Insufficient documentation

## 2019-12-30 DIAGNOSIS — Z79899 Other long term (current) drug therapy: Secondary | ICD-10-CM | POA: Insufficient documentation

## 2019-12-30 DIAGNOSIS — Z23 Encounter for immunization: Secondary | ICD-10-CM | POA: Insufficient documentation

## 2019-12-30 DIAGNOSIS — E039 Hypothyroidism, unspecified: Secondary | ICD-10-CM | POA: Insufficient documentation

## 2019-12-30 DIAGNOSIS — D72819 Decreased white blood cell count, unspecified: Secondary | ICD-10-CM | POA: Insufficient documentation

## 2019-12-30 DIAGNOSIS — E1122 Type 2 diabetes mellitus with diabetic chronic kidney disease: Secondary | ICD-10-CM | POA: Insufficient documentation

## 2019-12-30 DIAGNOSIS — N184 Chronic kidney disease, stage 4 (severe): Secondary | ICD-10-CM | POA: Insufficient documentation

## 2020-01-12 ENCOUNTER — Telehealth: Payer: Self-pay | Admitting: Hematology

## 2020-01-12 NOTE — Telephone Encounter (Signed)
Scheduled appt per 9/14 sch msg - pt son aware of appt date and time

## 2020-01-25 NOTE — Progress Notes (Signed)
[place]   Telephone:(336) [phone] Fax:(336) [phone]   Clinic Follow up Note   Patient Care Team: Leeroy Cha, MD as PCP - General (Internal Medicine) Estanislado Emms, MD as Consulting Physician (Nephrology) 01/26/2020  CHIEF COMPLAINT: F/u anemia  CURRENT THERAPY: Aranesp injections every68month, increased to every 2 months on 12/10/19dueto worsening anemia. Increased to 3043m every 6 weeks on 10/07/18.changed to Retacrit 40K units every 6 weeks from 03/10/2019, reduce to 20K unit every 4 weeks on 04/27/2019, changed to q6 weeks 07/2019   INTERVAL HISTORY: Ms. MaRockerseturns for f/u as scheduled. She was last seen 08/26/19. Labs in June and July showed Hg 10-11 and stable iron studies. Her last retacrit injection was 09/2019.    She is here with her son.  Feels well in general, no changes to her baseline health since last visit.  Appetite and energy are adequate.  Denies any signs of bleeding.  Denies recent fever, infection, cough, chest pain, dyspnea, change in bowel habits, pain, or new concerns.   MEDICAL HISTORY:  Past Medical History:  Diagnosis Date  . Anemia 10/2003  . Diabetes mellitus   . Hypertension     SURGICAL HISTORY: Past Surgical History:  Procedure Laterality Date  . CATARACT EXTRACTION  12/06/2003   left eye  . TUBAL LIGATION  1977  . YAG LASER APPLICATION Right 11/04/90/1194 Procedure: YAG LASER APPLICATION;  Surgeon: MaElta Guadeloupe. ShGershon CraneMD;  Location: [facility];  Service: Ophthalmology;  Laterality: Right;    I have reviewed the social history and family history with the patient and they are unchanged from previous note.  ALLERGIES:  has No Known Allergies.  MEDICATIONS:  Current Outpatient Medications  Medication Sig Dispense Refill  . aspirin 81 MG tablet Take 81 mg by mouth daily.     . Marland Kitchentorvastatin (LIPITOR) 40 MG tablet Take 40 mg by mouth daily.    . calcium carbonate (CALCIUM 600 HIGH POTENCY) 600 MG TABS tablet Take 600  mg by mouth 2 (two) times daily with a meal.    . cholecalciferol (VITAMIN D3) 25 MCG (1000 UT) tablet Take 1,000 Units by mouth daily.    . ferrous sulfate 325 (65 FE) MG tablet Take 650 mg by mouth daily with breakfast.     . levothyroxine (SYNTHROID) 75 MCG tablet Take 75 mcg by mouth every morning.    . Multiple Vitamin (MULTIVITAMIN) tablet Take 1 tablet by mouth daily.    . Marland Kitchenlmesartan (BENICAR) 40 MG tablet Take 40 mg by mouth daily.    . Vladimir Fasterlycol-Propyl Glycol (SYSTANE OP) Place 2 drops into both eyes 4 (four) times daily as needed (dryness).     . Polyethylene Glycol 3350 (MIRALAX PO) Take 17 g by mouth daily as needed (constipation).     . potassium chloride 20 MEQ TBCR Take 20 mEq by mouth daily. 30 tablet 3   No current facility-administered medications for this visit.   Facility-Administered Medications Ordered in Other Visits  Medication Dose Route Frequency Provider Last Rate Last Admin  . epoetin alfa-epbx (RETACRIT) injection 20,000 Units  20,000 Units Subcutaneous Once FeTruitt MerleMD        PHYSICAL EXAMINATION: ECOG PERFORMANCE STATUS: 0 - Asymptomatic  Vitals:   01/26/20 1520  BP: (!) 165/91  Pulse: 79  Resp: 18  Temp: 98.4 F (36.9 C)  SpO2: 100%   Filed Weights   01/26/20 1520  Weight: 143 lb 14.4 oz (65.3 kg)    GENERAL:alert, no distress  and comfortable SKIN: No rash to exposed skin EYES: sclera clear LUNGS: clear with normal breathing effort HEART: regular rate & rhythm, no lower extremity edema NEURO: alert & oriented x 3 with fluent speech  LABORATORY DATA:  I have reviewed the data as listed CBC Latest Ref Rng & Units 01/26/2020 11/18/2019 10/07/2019  WBC 4.0 - 10.5 K/uL 4.1 3.4(L) 3.8(L)  Hemoglobin 12.0 - 15.0 g/dL 11.2(L) 11.0(L) 10.4(L)  Hematocrit 36 - 46 % 35.2(L) 34.6(L) 32.7(L)  Platelets 150 - 400 K/uL 152 136(L) 134(L)     RADIOGRAPHIC STUDIES: I have personally reviewed the radiological images as listed and agreed with the  findings in the report. No results found.   ASSESSMENT & PLAN: AMARYLIS ROVITO is a 85 y.o. female with    1. Anemia due to chronic renal failure treated with erythropoietin. -Shehas beentreated with Aranespinjection, goal Hg 9-11. Tolerating well.It was changed to Retacrit from 03/10/19 due to insurance issue. Changed from q4 weeks to q6 weeks in 07/2019 due to good response and  2.CKD stage IV  -f/u withnephrologist  3.HTN, diabetes, arthritis, osteoporosis -Continue medications -F/u with PCP  4. Hypothyroidism  -Managed by endocrinologist Dr. Buddy Duty -on Levothyroxine   5.Mild leukocytopenia and thrombocytopenia -overall mild and stable, etiology unknown. Holding bone marrow biopsy to r/o MDS for now -WBC and PLT normal today  -continue monitoring   Disposition: Ms. Stumpo appears stable.  Her hemoglobin has improved, Hg 11.2.  No symptoms of anemia.  We will hold the Retacrit injection today.  Her mild leukopenia and thrombocytopenia have resolved.  Iron studies are pending. We will continue to monitor her CBC closely.   She will get the flu vaccine today.  She will return for lab and Aranesp in 6 weeks.  Follow-up in 18 weeks. She knows to call sooner if she develops symptoms of anemia, bleeding, fever, etc.   All questions were answered. The patient knows to call the clinic with any problems, questions or concerns. No barriers to learning were detected.     Alla Feeling, NP 01/26/20

## 2020-01-26 ENCOUNTER — Inpatient Hospital Stay: Payer: Medicare PPO

## 2020-01-26 ENCOUNTER — Encounter: Payer: Self-pay | Admitting: Nurse Practitioner

## 2020-01-26 ENCOUNTER — Other Ambulatory Visit: Payer: Self-pay

## 2020-01-26 ENCOUNTER — Inpatient Hospital Stay (HOSPITAL_BASED_OUTPATIENT_CLINIC_OR_DEPARTMENT_OTHER): Payer: Medicare PPO | Admitting: Nurse Practitioner

## 2020-01-26 VITALS — BP 165/91 | HR 79 | Temp 98.4°F | Resp 18 | Ht 60.0 in | Wt 143.9 lb

## 2020-01-26 DIAGNOSIS — D631 Anemia in chronic kidney disease: Secondary | ICD-10-CM

## 2020-01-26 DIAGNOSIS — I129 Hypertensive chronic kidney disease with stage 1 through stage 4 chronic kidney disease, or unspecified chronic kidney disease: Secondary | ICD-10-CM | POA: Diagnosis not present

## 2020-01-26 DIAGNOSIS — E039 Hypothyroidism, unspecified: Secondary | ICD-10-CM | POA: Diagnosis not present

## 2020-01-26 DIAGNOSIS — D72819 Decreased white blood cell count, unspecified: Secondary | ICD-10-CM | POA: Diagnosis not present

## 2020-01-26 DIAGNOSIS — Z23 Encounter for immunization: Secondary | ICD-10-CM

## 2020-01-26 DIAGNOSIS — N184 Chronic kidney disease, stage 4 (severe): Secondary | ICD-10-CM

## 2020-01-26 DIAGNOSIS — M1990 Unspecified osteoarthritis, unspecified site: Secondary | ICD-10-CM | POA: Diagnosis not present

## 2020-01-26 DIAGNOSIS — Z7982 Long term (current) use of aspirin: Secondary | ICD-10-CM | POA: Diagnosis not present

## 2020-01-26 DIAGNOSIS — D696 Thrombocytopenia, unspecified: Secondary | ICD-10-CM | POA: Diagnosis not present

## 2020-01-26 DIAGNOSIS — Z79899 Other long term (current) drug therapy: Secondary | ICD-10-CM | POA: Diagnosis not present

## 2020-01-26 DIAGNOSIS — E1122 Type 2 diabetes mellitus with diabetic chronic kidney disease: Secondary | ICD-10-CM | POA: Diagnosis not present

## 2020-01-26 LAB — CBC WITH DIFFERENTIAL/PLATELET
Abs Immature Granulocytes: 0 10*3/uL (ref 0.00–0.07)
Basophils Absolute: 0 10*3/uL (ref 0.0–0.1)
Basophils Relative: 1 %
Eosinophils Absolute: 0.1 10*3/uL (ref 0.0–0.5)
Eosinophils Relative: 2 %
HCT: 35.2 % — ABNORMAL LOW (ref 36.0–46.0)
Hemoglobin: 11.2 g/dL — ABNORMAL LOW (ref 12.0–15.0)
Immature Granulocytes: 0 %
Lymphocytes Relative: 22 %
Lymphs Abs: 0.9 10*3/uL (ref 0.7–4.0)
MCH: 27.3 pg (ref 26.0–34.0)
MCHC: 31.8 g/dL (ref 30.0–36.0)
MCV: 85.6 fL (ref 80.0–100.0)
Monocytes Absolute: 0.4 10*3/uL (ref 0.1–1.0)
Monocytes Relative: 11 %
Neutro Abs: 2.6 10*3/uL (ref 1.7–7.7)
Neutrophils Relative %: 64 %
Platelets: 152 10*3/uL (ref 150–400)
RBC: 4.11 MIL/uL (ref 3.87–5.11)
RDW: 13.5 % (ref 11.5–15.5)
WBC: 4.1 10*3/uL (ref 4.0–10.5)
nRBC: 0 % (ref 0.0–0.2)

## 2020-01-26 MED ORDER — INFLUENZA VAC A&B SA ADJ QUAD 0.5 ML IM PRSY
PREFILLED_SYRINGE | INTRAMUSCULAR | Status: AC
  Filled 2020-01-26: qty 0.5

## 2020-01-26 MED ORDER — INFLUENZA VAC A&B SA ADJ QUAD 0.5 ML IM PRSY
0.5000 mL | PREFILLED_SYRINGE | Freq: Once | INTRAMUSCULAR | Status: AC
  Administered 2020-01-26: 0.5 mL via INTRAMUSCULAR

## 2020-01-26 NOTE — Patient Instructions (Signed)
Influenza Virus Vaccine injection What is this medicine? INFLUENZA VIRUS VACCINE (in floo EN zuh VAHY ruhs vak SEEN) helps to reduce the risk of getting influenza also known as the flu. The vaccine only helps protect you against some strains of the flu. This medicine may be used for other purposes; ask your health care provider or pharmacist if you have questions. COMMON BRAND NAME(S): Afluria, Afluria Quadrivalent, Agriflu, Alfuria, FLUAD, Fluarix, Fluarix Quadrivalent, Flublok, Flublok Quadrivalent, FLUCELVAX, FLUCELVAX Quadrivalent, Flulaval, Flulaval Quadrivalent, Fluvirin, Fluzone, Fluzone High-Dose, Fluzone Intradermal, Fluzone Quadrivalent What should I tell my health care provider before I take this medicine? They need to know if you have any of these conditions:  bleeding disorder like hemophilia  fever or infection  Guillain-Barre syndrome or other neurological problems  immune system problems  infection with the human immunodeficiency virus (HIV) or AIDS  low blood platelet counts  multiple sclerosis  an unusual or allergic reaction to influenza virus vaccine, latex, other medicines, foods, dyes, or preservatives. Different brands of vaccines contain different allergens. Some may contain latex or eggs. Talk to your doctor about your allergies to make sure that you get the right vaccine.  pregnant or trying to get pregnant  breast-feeding How should I use this medicine? This vaccine is for injection into a muscle or under the skin. It is given by a health care professional. A copy of Vaccine Information Statements will be given before each vaccination. Read this sheet carefully each time. The sheet may change frequently. Talk to your healthcare provider to see which vaccines are right for you. Some vaccines should not be used in all age groups. Overdosage: If you think you have taken too much of this medicine contact a poison control center or emergency room at once. NOTE:  This medicine is only for you. Do not share this medicine with others. What if I miss a dose? This does not apply. What may interact with this medicine?  chemotherapy or radiation therapy  medicines that lower your immune system like etanercept, anakinra, infliximab, and adalimumab  medicines that treat or prevent blood clots like warfarin  phenytoin  steroid medicines like prednisone or cortisone  theophylline  vaccines This list may not describe all possible interactions. Give your health care provider a list of all the medicines, herbs, non-prescription drugs, or dietary supplements you use. Also tell them if you smoke, drink alcohol, or use illegal drugs. Some items may interact with your medicine. What should I watch for while using this medicine? Report any side effects that do not go away within 3 days to your doctor or health care professional. Call your health care provider if any unusual symptoms occur within 6 weeks of receiving this vaccine. You may still catch the flu, but the illness is not usually as bad. You cannot get the flu from the vaccine. The vaccine will not protect against colds or other illnesses that may cause fever. The vaccine is needed every year. What side effects may I notice from receiving this medicine? Side effects that you should report to your doctor or health care professional as soon as possible:  allergic reactions like skin rash, itching or hives, swelling of the face, lips, or tongue Side effects that usually do not require medical attention (report to your doctor or health care professional if they continue or are bothersome):  fever  headache  muscle aches and pains  pain, tenderness, redness, or swelling at the injection site  tiredness This list may not describe  all possible side effects. Call your doctor for medical advice about side effects. You may report side effects to FDA at 1-800-FDA-1088. Where should I keep my medicine? The  vaccine will be given by a health care professional in a clinic, pharmacy, doctor's office, or other health care setting. You will not be given vaccine doses to store at home. NOTE: This sheet is a summary. It may not cover all possible information. If you have questions about this medicine, talk to your doctor, pharmacist, or health care provider.  2020 Elsevier/Gold Standard (2018-03-10 08:45:43)

## 2020-01-27 ENCOUNTER — Telehealth: Payer: Self-pay | Admitting: Nurse Practitioner

## 2020-01-27 LAB — FERRITIN: Ferritin: 497 ng/mL — ABNORMAL HIGH (ref 11–307)

## 2020-01-27 NOTE — Telephone Encounter (Signed)
Scheduled per 9/29 los. Pt is aware of appt times and dates.

## 2020-03-08 ENCOUNTER — Inpatient Hospital Stay: Payer: Medicare PPO

## 2020-03-08 ENCOUNTER — Inpatient Hospital Stay: Payer: Medicare PPO | Attending: Hematology

## 2020-04-07 ENCOUNTER — Telehealth: Payer: Self-pay | Admitting: Hematology

## 2020-04-07 NOTE — Telephone Encounter (Signed)
Rescheduled appts per 12/10 incoming call from pt's son. Pt wanted to move labs and injection out to later date after holidays. Pt's son confirmed new appt date and time.

## 2020-04-19 ENCOUNTER — Inpatient Hospital Stay: Payer: Medicare HMO

## 2020-04-29 ENCOUNTER — Other Ambulatory Visit: Payer: Self-pay | Admitting: Hematology

## 2020-05-02 ENCOUNTER — Telehealth: Payer: Self-pay

## 2020-05-02 NOTE — Telephone Encounter (Signed)
I spoke with Ms Dredge.  I informed her , her insurance has not authorized the use of retacrit.  I told her we are rescheduling her injection appt for 2 weeks.  I told her she will still need her labs drawn tomorrow.  She verbalized understanding.  Scheduling message sent for retacrit injection.

## 2020-05-03 ENCOUNTER — Inpatient Hospital Stay: Payer: Medicare HMO

## 2020-05-03 ENCOUNTER — Inpatient Hospital Stay: Payer: Medicare HMO | Attending: Hematology

## 2020-05-03 DIAGNOSIS — D631 Anemia in chronic kidney disease: Secondary | ICD-10-CM | POA: Insufficient documentation

## 2020-05-03 DIAGNOSIS — N184 Chronic kidney disease, stage 4 (severe): Secondary | ICD-10-CM | POA: Insufficient documentation

## 2020-05-04 ENCOUNTER — Other Ambulatory Visit (HOSPITAL_COMMUNITY): Payer: Self-pay | Admitting: Internal Medicine

## 2020-05-04 DIAGNOSIS — Z1231 Encounter for screening mammogram for malignant neoplasm of breast: Secondary | ICD-10-CM

## 2020-05-24 ENCOUNTER — Telehealth: Payer: Self-pay | Admitting: Hematology

## 2020-05-24 ENCOUNTER — Telehealth: Payer: Self-pay

## 2020-05-24 NOTE — Telephone Encounter (Signed)
Rescheduled upcoming appointment per provider's request. Patient's sister is aware of changes.

## 2020-05-24 NOTE — Telephone Encounter (Signed)
I attempted to contact patient to schedule lab appt for this week.  She did not answer her phone and I was unable to leave message.

## 2020-05-24 NOTE — Telephone Encounter (Signed)
-----   Message from Truitt Merle, MD sent at 05/24/2020 10:08 AM EST ----- Regarding: RE: 2/2 Retacrit Lindsey Mcguire,  Could you schedule her lab next this week? Make sure she has CBC, ferritin, iron+TIBC ordered  Thanks   Krista Blue  ----- Message ----- From: Blenda Peals Sent: 05/24/2020  10:04 AM EST To: Gaspar Bidding, Truitt Merle, MD, # Subject: 2/2 Retacrit                                   Patient is scheduled on 2/2 for Retacrit but her last labs were in September 2021.  [employer] will not approve retacrit without most recent labs being within 30 days.   I do not see where she is scheduled to have labs until 2/2.   Thanks!
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
## 2020-05-29 ENCOUNTER — Other Ambulatory Visit: Payer: Self-pay

## 2020-05-29 ENCOUNTER — Telehealth: Payer: Self-pay | Admitting: Hematology

## 2020-05-29 ENCOUNTER — Inpatient Hospital Stay: Payer: Medicare HMO

## 2020-05-29 ENCOUNTER — Inpatient Hospital Stay (HOSPITAL_BASED_OUTPATIENT_CLINIC_OR_DEPARTMENT_OTHER): Payer: Medicare HMO | Admitting: Hematology

## 2020-05-29 VITALS — BP 147/80 | HR 84 | Temp 97.6°F | Resp 15 | Ht 60.0 in | Wt 150.1 lb

## 2020-05-29 DIAGNOSIS — D631 Anemia in chronic kidney disease: Secondary | ICD-10-CM

## 2020-05-29 DIAGNOSIS — E1122 Type 2 diabetes mellitus with diabetic chronic kidney disease: Secondary | ICD-10-CM | POA: Diagnosis not present

## 2020-05-29 DIAGNOSIS — N184 Chronic kidney disease, stage 4 (severe): Secondary | ICD-10-CM | POA: Diagnosis not present

## 2020-05-29 DIAGNOSIS — D696 Thrombocytopenia, unspecified: Secondary | ICD-10-CM | POA: Diagnosis not present

## 2020-05-29 LAB — CBC WITH DIFFERENTIAL/PLATELET
Abs Immature Granulocytes: 0.01 10*3/uL (ref 0.00–0.07)
Basophils Absolute: 0 10*3/uL (ref 0.0–0.1)
Basophils Relative: 1 %
Eosinophils Absolute: 0.1 10*3/uL (ref 0.0–0.5)
Eosinophils Relative: 2 %
HCT: 32.8 % — ABNORMAL LOW (ref 36.0–46.0)
Hemoglobin: 10.5 g/dL — ABNORMAL LOW (ref 12.0–15.0)
Immature Granulocytes: 0 %
Lymphocytes Relative: 24 %
Lymphs Abs: 1.1 10*3/uL (ref 0.7–4.0)
MCH: 28 pg (ref 26.0–34.0)
MCHC: 32 g/dL (ref 30.0–36.0)
MCV: 87.5 fL (ref 80.0–100.0)
Monocytes Absolute: 0.7 10*3/uL (ref 0.1–1.0)
Monocytes Relative: 16 %
Neutro Abs: 2.5 10*3/uL (ref 1.7–7.7)
Neutrophils Relative %: 57 %
Platelets: 142 10*3/uL — ABNORMAL LOW (ref 150–400)
RBC: 3.75 MIL/uL — ABNORMAL LOW (ref 3.87–5.11)
RDW: 13.6 % (ref 11.5–15.5)
WBC: 4.4 10*3/uL (ref 4.0–10.5)
nRBC: 0 % (ref 0.0–0.2)

## 2020-05-29 LAB — IRON AND TIBC
Iron: 94 ug/dL (ref 41–142)
Saturation Ratios: 39 % (ref 21–57)
TIBC: 241 ug/dL (ref 236–444)
UIBC: 147 ug/dL (ref 120–384)

## 2020-05-29 LAB — FERRITIN: Ferritin: 477 ng/mL — ABNORMAL HIGH (ref 11–307)

## 2020-05-29 MED ORDER — EPOETIN ALFA-EPBX 40000 UNIT/ML IJ SOLN: 20000.0000 [IU] | Freq: Once | INTRAMUSCULAR | Status: DC

## 2020-05-29 MED ORDER — EPOETIN ALFA-EPBX 10000 UNIT/ML IJ SOLN
10000.0000 [IU] | Freq: Once | INTRAMUSCULAR | Status: AC
  Administered 2020-05-29: 10000 [IU] via SUBCUTANEOUS

## 2020-05-29 MED ORDER — EPOETIN ALFA-EPBX 10000 UNIT/ML IJ SOLN
INTRAMUSCULAR | Status: AC
  Filled 2020-05-29: qty 2

## 2020-05-29 NOTE — Patient Instructions (Signed)

## 2020-05-29 NOTE — Telephone Encounter (Signed)
Scheduled appointments per 1/31 los. Spoke to patient who is aware of appointments dates and times.

## 2020-05-30 ENCOUNTER — Encounter: Payer: Self-pay | Admitting: Hematology

## 2020-05-31 ENCOUNTER — Inpatient Hospital Stay: Payer: Medicare HMO | Admitting: Hematology

## 2020-05-31 ENCOUNTER — Inpatient Hospital Stay: Payer: Medicare HMO

## 2020-06-09 ENCOUNTER — Ambulatory Visit (HOSPITAL_COMMUNITY): Payer: Medicare PPO

## 2020-07-27 ENCOUNTER — Inpatient Hospital Stay: Payer: BC Managed Care – PPO | Attending: Hematology

## 2020-07-27 ENCOUNTER — Inpatient Hospital Stay: Payer: BC Managed Care – PPO

## 2020-09-26 ENCOUNTER — Other Ambulatory Visit: Payer: Self-pay

## 2020-09-26 ENCOUNTER — Inpatient Hospital Stay: Payer: Medicare PPO

## 2020-09-26 ENCOUNTER — Inpatient Hospital Stay: Payer: Medicare PPO | Attending: Hematology

## 2020-09-26 DIAGNOSIS — N184 Chronic kidney disease, stage 4 (severe): Secondary | ICD-10-CM | POA: Insufficient documentation

## 2020-09-26 DIAGNOSIS — D631 Anemia in chronic kidney disease: Secondary | ICD-10-CM | POA: Diagnosis not present

## 2020-09-26 LAB — CBC WITH DIFFERENTIAL/PLATELET
Abs Immature Granulocytes: 0.01 10*3/uL (ref 0.00–0.07)
Basophils Absolute: 0 10*3/uL (ref 0.0–0.1)
Basophils Relative: 1 %
Eosinophils Absolute: 0.1 10*3/uL (ref 0.0–0.5)
Eosinophils Relative: 2 %
HCT: 33.9 % — ABNORMAL LOW (ref 36.0–46.0)
Hemoglobin: 10.7 g/dL — ABNORMAL LOW (ref 12.0–15.0)
Immature Granulocytes: 0 %
Lymphocytes Relative: 24 %
Lymphs Abs: 0.8 10*3/uL (ref 0.7–4.0)
MCH: 27.4 pg (ref 26.0–34.0)
MCHC: 31.6 g/dL (ref 30.0–36.0)
MCV: 86.9 fL (ref 80.0–100.0)
Monocytes Absolute: 0.5 10*3/uL (ref 0.1–1.0)
Monocytes Relative: 14 %
Neutro Abs: 2.1 10*3/uL (ref 1.7–7.7)
Neutrophils Relative %: 59 %
Platelets: 144 10*3/uL — ABNORMAL LOW (ref 150–400)
RBC: 3.9 MIL/uL (ref 3.87–5.11)
RDW: 13.9 % (ref 11.5–15.5)
WBC: 3.5 10*3/uL — ABNORMAL LOW (ref 4.0–10.5)
nRBC: 0 % (ref 0.0–0.2)

## 2020-09-26 LAB — FERRITIN: Ferritin: 451 ng/mL — ABNORMAL HIGH (ref 11–307)

## 2020-09-26 NOTE — Progress Notes (Signed)
Patinet's Hgb 10.7, Aranesp held per parameters. RTC 8/1 for labs and MD visit

## 2020-10-12 DIAGNOSIS — Z7984 Long term (current) use of oral hypoglycemic drugs: Secondary | ICD-10-CM | POA: Diagnosis not present

## 2020-10-12 DIAGNOSIS — E119 Type 2 diabetes mellitus without complications: Secondary | ICD-10-CM | POA: Diagnosis not present

## 2020-10-12 DIAGNOSIS — Z961 Presence of intraocular lens: Secondary | ICD-10-CM | POA: Diagnosis not present

## 2020-11-27 ENCOUNTER — Inpatient Hospital Stay (HOSPITAL_BASED_OUTPATIENT_CLINIC_OR_DEPARTMENT_OTHER): Payer: Medicare PPO | Admitting: Hematology

## 2020-11-27 ENCOUNTER — Inpatient Hospital Stay: Payer: Medicare PPO | Attending: Hematology

## 2020-11-27 ENCOUNTER — Encounter: Payer: Self-pay | Admitting: Hematology

## 2020-11-27 ENCOUNTER — Inpatient Hospital Stay: Payer: Medicare PPO

## 2020-11-27 ENCOUNTER — Other Ambulatory Visit: Payer: Self-pay

## 2020-11-27 VITALS — BP 130/70 | HR 81 | Temp 98.4°F | Resp 17 | Ht 60.0 in | Wt 146.8 lb

## 2020-11-27 DIAGNOSIS — N184 Chronic kidney disease, stage 4 (severe): Secondary | ICD-10-CM | POA: Diagnosis not present

## 2020-11-27 DIAGNOSIS — D631 Anemia in chronic kidney disease: Secondary | ICD-10-CM | POA: Insufficient documentation

## 2020-11-27 LAB — CBC WITH DIFFERENTIAL/PLATELET
Abs Immature Granulocytes: 0 10*3/uL (ref 0.00–0.07)
Basophils Absolute: 0 10*3/uL (ref 0.0–0.1)
Basophils Relative: 1 %
Eosinophils Absolute: 0.1 10*3/uL (ref 0.0–0.5)
Eosinophils Relative: 1 %
HCT: 32.6 % — ABNORMAL LOW (ref 36.0–46.0)
Hemoglobin: 10.3 g/dL — ABNORMAL LOW (ref 12.0–15.0)
Immature Granulocytes: 0 %
Lymphocytes Relative: 22 %
Lymphs Abs: 0.9 10*3/uL (ref 0.7–4.0)
MCH: 27.6 pg (ref 26.0–34.0)
MCHC: 31.6 g/dL (ref 30.0–36.0)
MCV: 87.4 fL (ref 80.0–100.0)
Monocytes Absolute: 0.6 10*3/uL (ref 0.1–1.0)
Monocytes Relative: 14 %
Neutro Abs: 2.6 10*3/uL (ref 1.7–7.7)
Neutrophils Relative %: 62 %
Platelets: 152 10*3/uL (ref 150–400)
RBC: 3.73 MIL/uL — ABNORMAL LOW (ref 3.87–5.11)
RDW: 13.2 % (ref 11.5–15.5)
WBC: 4.2 10*3/uL (ref 4.0–10.5)
nRBC: 0 % (ref 0.0–0.2)

## 2020-11-27 LAB — FERRITIN: Ferritin: 534 ng/mL — ABNORMAL HIGH (ref 11–307)

## 2020-11-27 MED ORDER — EPOETIN ALFA-EPBX 40000 UNIT/ML IJ SOLN: 20000.0000 [IU] | Freq: Once | INTRAMUSCULAR | Status: DC

## 2020-11-27 MED ORDER — EPOETIN ALFA-EPBX 10000 UNIT/ML IJ SOLN
INTRAMUSCULAR | Status: AC
  Filled 2020-11-27: qty 2

## 2020-11-27 MED ORDER — EPOETIN ALFA-EPBX 20000 UNIT/ML IJ SOLN
20000.0000 [IU] | Freq: Once | INTRAMUSCULAR | Status: AC
  Administered 2020-11-27: 20000 [IU] via SUBCUTANEOUS
  Filled 2020-11-27: qty 1

## 2020-11-27 NOTE — Patient Instructions (Signed)
Epoetin Alfa injection What is this medication? EPOETIN ALFA (e POE e tin AL fa) helps your body make more red blood cells. This medicine is used to treat anemia caused by chronic kidney disease, cancer chemotherapy, or HIV-therapy. It may also be used before surgery if you have anemia. This medicine may be used for other purposes; ask your health care provider or pharmacist if you have questions. COMMON BRAND NAME(S): Epogen, Procrit, Retacrit What should I tell my care team before I take this medication? They need to know if you have any of these conditions: cancer heart disease high blood pressure history of blood clots history of stroke low levels of folate, iron, or vitamin B12 in the blood seizures an unusual or allergic reaction to erythropoietin, albumin, benzyl alcohol, hamster proteins, other medicines, foods, dyes, or preservatives pregnant or trying to get pregnant breast-feeding How should I use this medication? This medicine is for injection into a vein or under the skin. It is usually given by a health care professional in a hospital or clinic setting. If you get this medicine at home, you will be taught how to prepare and give this medicine. Use exactly as directed. Take your medicine at regular intervals. Do not take your medicine more often than directed. It is important that you put your used needles and syringes in a special sharps container. Do not put them in a trash can. If you do not have a sharps container, call your pharmacist or healthcare provider to get one. A special MedGuide will be given to you by the pharmacist with each prescription and refill. Be sure to read this information carefully each time. Talk to your pediatrician regarding the use of this medicine in children. While this drug may be prescribed for selected conditions, precautions do apply. Overdosage: If you think you have taken too much of this medicine contact a poison control center or emergency  room at once. NOTE: This medicine is only for you. Do not share this medicine with others. What if I miss a dose? If you miss a dose, take it as soon as you can. If it is almost time for your next dose, take only that dose. Do not take double or extra doses. What may interact with this medication? Interactions have not been studied. This list may not describe all possible interactions. Give your health care provider a list of all the medicines, herbs, non-prescription drugs, or dietary supplements you use. Also tell them if you smoke, drink alcohol, or use illegal drugs. Some items may interact with your medicine. What should I watch for while using this medication? Your condition will be monitored carefully while you are receiving this medicine. You may need blood work done while you are taking this medicine. This medicine may cause a decrease in vitamin B6. You should make sure that you get enough vitamin B6 while you are taking this medicine. Discuss the foods you eat and the vitamins you take with your health care professional. What side effects may I notice from receiving this medication? Side effects that you should report to your doctor or health care professional as soon as possible: allergic reactions like skin rash, itching or hives, swelling of the face, lips, or tongue seizures signs and symptoms of a blood clot such as breathing problems; changes in vision; chest pain; severe, sudden headache; pain, swelling, warmth in the leg; trouble speaking; sudden numbness or weakness of the face, arm or leg signs and symptoms of a stroke like   changes in vision; confusion; trouble speaking or understanding; severe headaches; sudden numbness or weakness of the face, arm or leg; trouble walking; dizziness; loss of balance or coordination Side effects that usually do not require medical attention (report to your doctor or health care professional if they continue or are  bothersome): chills cough dizziness fever headaches joint pain muscle cramps muscle pain nausea, vomiting pain, redness, or irritation at site where injected This list may not describe all possible side effects. Call your doctor for medical advice about side effects. You may report side effects to FDA at 1-800-FDA-1088. Where should I keep my medication? Keep out of the reach of children. Store in a refrigerator between 2 and 8 degrees C (36 and 46 degrees F). Do not freeze or shake. Throw away any unused portion if using a single-dose vial. Multi-dose vials can be kept in the refrigerator for up to 21 days after the initial dose. Throw away unused medicine. NOTE: This sheet is a summary. It may not cover all possible information. If you have questions about this medicine, talk to your doctor, pharmacist, or health care provider.  2022 Elsevier/Gold Standard (2016-11-22 08:35:19)  

## 2020-11-27 NOTE — Progress Notes (Signed)
[place]   [phone] Fax:([phone]) [phone]   Clinic Follow up Note   Patient Care Team: Leeroy Cha, MD as PCP - General (Internal Medicine) Estanislado Emms, MD (Inactive) as Consulting Physician (Nephrology)  Date of Service:  11/27/2020  CHIEF COMPLAINT: F/u for anemia   CURRENT THERAPY:  Aranesp injections every 3 months, increased to every 2 months on 04/07/18 due to worsening anemia. Increased to 342mg every 6 weeks on 10/07/18. Changed to Retacrit 40K units every 6 weeks from 03/10/2019, reduce to 20K unit every 4 weeks on 04/27/2019   INTERVAL HISTORY:  Lindsey BACHEis here for a follow up of anemia. She was last seen by me on 05/29/20. She presents to the clinic with her son.  She reports fatigue. Her son adds that she "stays up half the night and wakes up early."  All other systems were reviewed with the patient and are negative.  MEDICAL HISTORY:  Past Medical History:  Diagnosis Date   Anemia 10/2003   Diabetes mellitus    Hypertension     SURGICAL HISTORY: Past Surgical History:  Procedure Laterality Date   CATARACT EXTRACTION  12/06/2003   left eye   TUBAL LIGATION  [id number]  YAG LASER APPLICATION Right [id number]  Procedure: YAG LASER APPLICATION;  Surgeon: MElta GuadeloupeT. SGershon Crane MD;  Location: [facility];  Service: Ophthalmology;  Laterality: Right;    I have reviewed the social history and family history with the patient and they are unchanged from previous note.  ALLERGIES:  is allergic to glimepiride and rosiglitazone.  MEDICATIONS:  Current Outpatient Medications  Medication Sig Dispense Refill   aspirin 81 MG tablet Take 81 mg by mouth daily.      atorvastatin (LIPITOR) 40 MG tablet Take 40 mg by mouth daily.     calcium carbonate (OS-CAL) 600 MG TABS tablet Take 600 mg by mouth 2 (two) times daily with a meal.     cholecalciferol (VITAMIN D3) 25 MCG (1000 UT) tablet Take 1,000 Units by mouth daily.     ferrous sulfate  325 (65 FE) MG tablet Take 650 mg by mouth daily with breakfast.      levothyroxine (SYNTHROID) 75 MCG tablet Take 75 mcg by mouth every morning.     Multiple Vitamin (MULTIVITAMIN) tablet Take 1 tablet by mouth daily.     olmesartan (BENICAR) 40 MG tablet Take 40 mg by mouth daily.     Polyethyl Glycol-Propyl Glycol (SYSTANE OP) Place 2 drops into both eyes 4 (four) times daily as needed (dryness).      Polyethylene Glycol 3350 (MIRALAX PO) Take 17 g by mouth daily as needed (constipation).      potassium chloride 20 MEQ TBCR Take 20 mEq by mouth daily. 30 tablet 3   No current facility-administered medications for this visit.   Facility-Administered Medications Ordered in Other Visits  Medication Dose Route Frequency Provider Last Rate Last Admin   epoetin alfa-epbx (RETACRIT) injection 20,000 Units  20,000 Units Subcutaneous Once FTruitt Merle MD        PHYSICAL EXAMINATION: ECOG PERFORMANCE STATUS: 1 - Symptomatic but completely ambulatory  Vitals:   11/27/20 1330  BP: 130/70  Pulse: 81  Resp: 17  Temp: 98.4 F (36.9 C)  SpO2: 100%   Filed Weights   11/27/20 1330  Weight: 146 lb 12.8 oz (66.6 kg)    GENERAL:alert, no distress and comfortable SKIN: skin color normal, no rashes or significant lesions EYES: normal, Conjunctiva are  pink and non-injected, sclera clear  NEURO: alert & oriented x 3 with fluent speech  LABORATORY DATA:  I have reviewed the data as listed CBC Latest Ref Rng & Units 11/27/2020 09/26/2020 05/29/2020  WBC 4.0 - 10.5 K/uL 4.2 3.5(L) 4.4  Hemoglobin 12.0 - 15.0 g/dL 10.3(L) 10.7(L) 10.5(L)  Hematocrit 36.0 - 46.0 % 32.6(L) 33.9(L) 32.8(L)  Platelets 150 - 400 K/uL 152 144(L) 142(L)     CMP Latest Ref Rng & Units 02/12/2019 02/11/2019 02/10/2019  Glucose 70 - 99 mg/dL 133(H) 101(H) 123(H)  BUN 8 - 23 mg/dL 27(H) 23 24(H)  Creatinine 0.44 - 1.00 mg/dL 1.81(H) 1.71(H) 1.62(H)  Sodium 135 - 145 mmol/L 145 147(H) 147(H)  Potassium 3.5 - 5.1 mmol/L 3.8  3.7 4.1  Chloride 98 - 111 mmol/L 105 107 108  CO2 22 - 32 mmol/L '30 29 27  '$ Calcium 8.9 - 10.3 mg/dL 8.9 9.1 9.0  Total Protein 6.5 - 8.1 g/dL - - -  Total Bilirubin 0.3 - 1.2 mg/dL - - -  Alkaline Phos 38 - 126 U/L - - -  AST 15 - 41 U/L - - -  ALT 0 - 44 U/L - - -      RADIOGRAPHIC STUDIES: I have personally reviewed the radiological images as listed and agreed with the findings in the report. No results found.   ASSESSMENT & PLAN:  Lindsey Mcguire is a 86 y.o. female with   1. Anemia due to chronic renal failure treated with erythropoietin. -She has been treated with Aranesp injection, goal Hg 9-11. Tolerating well. It was changed to Retacrit from 03/10/19 due to insurance issue. Currently on every 4 weeks injection.  -She is clinically stable. She only needed 2 Retacrit injections in 2021 and 1 on 05/29/20. She has not needed any further treatments since. -labs reviewed, Hgb 10.3, plt 152K today (11/27/20). Iron panel still pending. Will proceed with Retacrit today at same dose.  -Will monitor with labs every 3 months with retacrit 20K as needed for Hg<10.5  -F/u in 9 months    2. Comorbidities: HTN, CKD stage IV, Diabetes, arthritis, osteoporosis, Hypothyroidism -continue meds and f/u with PCP and specialists -She has been taking her once daily HTN medication 1/2 tablet BID. I recommend she take the whole tablet at once and to continue monitoring her BP at home.    3. Mild leukocytopenia and thrombocytopenia -overall mild and stable. WNL today (11/27/20)    PLAN:  -proceed with Retacrit today -labs every 3 months and retacrit if Hg<10.5 -labs and f/u in 9 months   No problem-specific Assessment & Plan notes found for this encounter.   No orders of the defined types were placed in this encounter.  All questions were answered. The patient knows to call the clinic with any problems, questions or concerns. No barriers to learning was detected. The total time spent in the  appointment was 25 minutes.     Truitt Merle, MD 11/27/2020   I, Wilburn Mylar, am acting as scribe for Truitt Merle, MD.   I have reviewed the above documentation for accuracy and completeness, and I agree with the above.

## 2020-11-28 ENCOUNTER — Telehealth: Payer: Self-pay | Admitting: Hematology

## 2020-11-28 NOTE — Telephone Encounter (Signed)
Unable to leave message with follow-up appointments per 8/1 los. Mailed calendar.

## 2021-02-27 ENCOUNTER — Inpatient Hospital Stay: Payer: Medicare PPO

## 2021-02-27 ENCOUNTER — Inpatient Hospital Stay: Payer: Medicare PPO | Attending: Hematology

## 2021-04-14 ENCOUNTER — Telehealth: Payer: Self-pay | Admitting: Physician Assistant

## 2021-04-14 NOTE — Telephone Encounter (Signed)
Paged by answering service.  Patient is asking to get "room for her surgery on January 11 by Dr. Gardiner Rhyme".  Patient was last seen by Dr. Nechama Guard 2 years ago.  No upcoming appointment.  Patient is asymptomatic.  I have asked patient to call Monday after 8 AM to speak with staff and get appointment if needed.

## 2021-05-23 ENCOUNTER — Encounter: Payer: Self-pay | Admitting: Internal Medicine

## 2021-05-30 ENCOUNTER — Inpatient Hospital Stay: Payer: Medicare PPO | Attending: Hematology

## 2021-05-30 ENCOUNTER — Inpatient Hospital Stay: Payer: Medicare PPO

## 2021-08-13 DIAGNOSIS — Z0000 Encounter for general adult medical examination without abnormal findings: Secondary | ICD-10-CM | POA: Diagnosis not present

## 2021-08-13 DIAGNOSIS — I5032 Chronic diastolic (congestive) heart failure: Secondary | ICD-10-CM | POA: Diagnosis not present

## 2021-08-13 DIAGNOSIS — I10 Essential (primary) hypertension: Secondary | ICD-10-CM | POA: Diagnosis not present

## 2021-08-13 DIAGNOSIS — Z1389 Encounter for screening for other disorder: Secondary | ICD-10-CM | POA: Diagnosis not present

## 2021-08-13 DIAGNOSIS — E039 Hypothyroidism, unspecified: Secondary | ICD-10-CM | POA: Diagnosis not present

## 2021-08-13 DIAGNOSIS — I129 Hypertensive chronic kidney disease with stage 1 through stage 4 chronic kidney disease, or unspecified chronic kidney disease: Secondary | ICD-10-CM | POA: Diagnosis not present

## 2021-08-13 DIAGNOSIS — R413 Other amnesia: Secondary | ICD-10-CM | POA: Diagnosis not present

## 2021-08-13 DIAGNOSIS — I2510 Atherosclerotic heart disease of native coronary artery without angina pectoris: Secondary | ICD-10-CM | POA: Diagnosis not present

## 2021-08-13 DIAGNOSIS — E1122 Type 2 diabetes mellitus with diabetic chronic kidney disease: Secondary | ICD-10-CM | POA: Diagnosis not present

## 2021-08-13 DIAGNOSIS — M810 Age-related osteoporosis without current pathological fracture: Secondary | ICD-10-CM | POA: Diagnosis not present

## 2021-08-16 DIAGNOSIS — E1122 Type 2 diabetes mellitus with diabetic chronic kidney disease: Secondary | ICD-10-CM | POA: Diagnosis not present

## 2021-08-26 ENCOUNTER — Other Ambulatory Visit: Payer: Self-pay | Admitting: Hematology

## 2021-08-26 DIAGNOSIS — N184 Chronic kidney disease, stage 4 (severe): Secondary | ICD-10-CM

## 2021-08-27 ENCOUNTER — Ambulatory Visit: Payer: Medicare PPO

## 2021-08-27 ENCOUNTER — Other Ambulatory Visit: Payer: Medicare PPO

## 2021-08-27 ENCOUNTER — Ambulatory Visit: Payer: Medicare PPO | Admitting: Hematology

## 2021-09-03 DIAGNOSIS — E038 Other specified hypothyroidism: Secondary | ICD-10-CM | POA: Diagnosis not present

## 2021-09-03 DIAGNOSIS — R413 Other amnesia: Secondary | ICD-10-CM | POA: Diagnosis not present

## 2021-09-03 DIAGNOSIS — I129 Hypertensive chronic kidney disease with stage 1 through stage 4 chronic kidney disease, or unspecified chronic kidney disease: Secondary | ICD-10-CM | POA: Diagnosis not present

## 2021-09-04 ENCOUNTER — Encounter: Payer: Self-pay | Admitting: Physician Assistant

## 2021-10-04 DIAGNOSIS — M1990 Unspecified osteoarthritis, unspecified site: Secondary | ICD-10-CM | POA: Diagnosis not present

## 2021-10-04 DIAGNOSIS — N390 Urinary tract infection, site not specified: Secondary | ICD-10-CM | POA: Diagnosis not present

## 2021-10-04 DIAGNOSIS — I129 Hypertensive chronic kidney disease with stage 1 through stage 4 chronic kidney disease, or unspecified chronic kidney disease: Secondary | ICD-10-CM | POA: Diagnosis not present

## 2021-10-04 DIAGNOSIS — D631 Anemia in chronic kidney disease: Secondary | ICD-10-CM | POA: Diagnosis not present

## 2021-10-04 DIAGNOSIS — E1122 Type 2 diabetes mellitus with diabetic chronic kidney disease: Secondary | ICD-10-CM | POA: Diagnosis not present

## 2021-10-04 DIAGNOSIS — N2581 Secondary hyperparathyroidism of renal origin: Secondary | ICD-10-CM | POA: Diagnosis not present

## 2021-10-04 DIAGNOSIS — N1832 Chronic kidney disease, stage 3b: Secondary | ICD-10-CM | POA: Diagnosis not present

## 2021-10-04 DIAGNOSIS — N189 Chronic kidney disease, unspecified: Secondary | ICD-10-CM | POA: Diagnosis not present

## 2021-10-15 DIAGNOSIS — Z961 Presence of intraocular lens: Secondary | ICD-10-CM | POA: Diagnosis not present

## 2021-10-15 DIAGNOSIS — H52203 Unspecified astigmatism, bilateral: Secondary | ICD-10-CM | POA: Diagnosis not present

## 2021-10-15 DIAGNOSIS — H5213 Myopia, bilateral: Secondary | ICD-10-CM | POA: Diagnosis not present

## 2021-10-15 DIAGNOSIS — H524 Presbyopia: Secondary | ICD-10-CM | POA: Diagnosis not present

## 2021-10-15 DIAGNOSIS — E119 Type 2 diabetes mellitus without complications: Secondary | ICD-10-CM | POA: Diagnosis not present

## 2021-10-17 ENCOUNTER — Ambulatory Visit: Payer: Medicare PPO | Admitting: Physician Assistant

## 2021-10-17 ENCOUNTER — Encounter: Payer: Self-pay | Admitting: Physician Assistant

## 2021-10-17 DIAGNOSIS — Z029 Encounter for administrative examinations, unspecified: Secondary | ICD-10-CM

## 2021-11-04 NOTE — Progress Notes (Deleted)
Cardiology Office Note:    Date:  11/04/2021   ID:  Lindsey Mcguire, DOB February 01, 1935, MRN [id number]  PCP:  Leeroy Cha, MD  Cardiologist:  None  Electrophysiologist:  None   Referring MD: Leeroy Cha,*   No chief complaint on file. ***  History of Present Illness:    Lindsey Mcguire is a 87 y.o. female with a hx of chronic diastolic heart failure, hypertension, T2DM, CKD stage IV, hypothyroidism, anemia who presents for follow-up.  She was admitted 01/2019 with edema and shortness of breath.  She underwent IV diuresis, and had a thoracentesis performed on 10/12 with 800 cc of fluid removed.  She was discharged on 10/15, weight was down 7 pounds from admission (203) pounds on discharge.  Creatinine 1.8 on discharge.  She was discharged on Lasix 40 mg daily and potassium 20 mEq daily.  Past Medical History:  Diagnosis Date   Anemia 10/2003   Diabetes mellitus    Hypertension     Past Surgical History:  Procedure Laterality Date   CATARACT EXTRACTION  12/06/2003   left eye   TUBAL LIGATION  8119   YAG LASER APPLICATION Right 1/47/8295   Procedure: YAG LASER APPLICATION;  Surgeon: Elta Guadeloupe T. Gershon Crane, MD;  Location: [facility];  Service: Ophthalmology;  Laterality: Right;    Current Medications: No outpatient medications have been marked as taking for the 11/07/21 encounter (Appointment) with Donato Heinz, MD.     Allergies:   Glimepiride and Rosiglitazone   Social History   Socioeconomic History   Marital status: Married    Spouse name: Not on file   Number of children: Not on file   Years of education: Not on file   Highest education level: Not on file  Occupational History   Not on file  Tobacco Use   Smoking status: Never   Smokeless tobacco: Never  Vaping Use   Vaping Use: Never used  Substance and Sexual Activity   Alcohol use: No   Drug use: No   Sexual activity: Not Currently    Partners: Male  Other Topics Concern   Not on  file  Social History Narrative   Not on file   Social Determinants of Health   Financial Resource Strain: Not on file  Food Insecurity: Not on file  Transportation Needs: Not on file  Physical Activity: Not on file  Stress: Not on file  Social Connections: Not on file     Family History: The patient's ***family history includes Cancer in her brother and brother; Diabetes in her brother, brother, maternal grandmother, and mother; Hypertension in her maternal grandmother and mother.  ROS:   Please see the history of present illness.    *** All other systems reviewed and are negative.  EKGs/Labs/Other Studies Reviewed:    The following studies were reviewed today: ***  EKG:  EKG is *** ordered today.  The ekg ordered today demonstrates ***  Recent Labs: 11/27/2020: Hemoglobin 10.3; Platelets 152  Recent Lipid Panel No results found for: "CHOL", "TRIG", "HDL", "CHOLHDL", "VLDL", "LDLCALC", "LDLDIRECT"  Physical Exam:    VS:  There were no vitals taken for this visit.    Wt Readings from Last 3 Encounters:  11/27/20 146 lb 12.8 oz (66.6 kg)  05/29/20 150 lb 1.6 oz (68.1 kg)  01/26/20 143 lb 14.4 oz (65.3 kg)     GEN: *** Well nourished, well developed in no acute distress HEENT: Normal NECK: No JVD; No carotid bruits LYMPHATICS: No lymphadenopathy  CARDIAC: ***RRR, no murmurs, rubs, gallops RESPIRATORY:  Clear to auscultation without rales, wheezing or rhonchi  ABDOMEN: Soft, non-tender, non-distended MUSCULOSKELETAL:  No edema; No deformity  SKIN: Warm and dry NEUROLOGIC:  Alert and oriented x 3 PSYCHIATRIC:  Normal affect   ASSESSMENT:    No diagnosis found. PLAN:    Chronic diastolic heart failure: Admission in October 2020 with volume overload, improved with IV diuresis.  Reports symptoms have been stable on Lasix 40 mg daily.  She states that she had recent blood work drawn by her nephrologist, will follow up results   Hypertension: On olmesartan 40 mg  daily   Hyperlipidemia: on atorvastatin 40 mg daily  CAD stage IV:  Anemia: Secondary to chronic renal failure, on erythropoietin.  Follows with hematology   RTC in ***   Medication Adjustments/Labs and Tests Ordered: Current medicines are reviewed at length with the patient today.  Concerns regarding medicines are outlined above.  No orders of the defined types were placed in this encounter.  No orders of the defined types were placed in this encounter.   There are no Patient Instructions on file for this visit.   Signed, Donato Heinz, MD  11/04/2021 4:52 PM    Luis M. Cintron

## 2021-11-07 ENCOUNTER — Ambulatory Visit: Payer: Medicare PPO | Admitting: Cardiology

## 2021-11-12 ENCOUNTER — Encounter: Payer: Self-pay | Admitting: Cardiology

## 2021-11-29 ENCOUNTER — Encounter: Payer: Self-pay | Admitting: *Deleted

## 2022-10-19 DIAGNOSIS — M62521 Muscle wasting and atrophy, not elsewhere classified, right upper arm: Secondary | ICD-10-CM | POA: Diagnosis not present

## 2022-10-19 DIAGNOSIS — F0390 Unspecified dementia without behavioral disturbance: Secondary | ICD-10-CM | POA: Diagnosis not present

## 2022-10-19 DIAGNOSIS — S2242XA Multiple fractures of ribs, left side, initial encounter for closed fracture: Secondary | ICD-10-CM | POA: Diagnosis not present

## 2022-10-19 DIAGNOSIS — E785 Hyperlipidemia, unspecified: Secondary | ICD-10-CM | POA: Diagnosis not present

## 2022-10-19 DIAGNOSIS — I351 Nonrheumatic aortic (valve) insufficiency: Secondary | ICD-10-CM | POA: Diagnosis not present

## 2022-10-19 DIAGNOSIS — S0282XD Fracture of other specified skull and facial bones, left side, subsequent encounter for fracture with routine healing: Secondary | ICD-10-CM | POA: Diagnosis not present

## 2022-10-19 DIAGNOSIS — M6281 Muscle weakness (generalized): Secondary | ICD-10-CM | POA: Diagnosis not present

## 2022-10-19 DIAGNOSIS — I214 Non-ST elevation (NSTEMI) myocardial infarction: Secondary | ICD-10-CM | POA: Diagnosis not present

## 2022-10-19 DIAGNOSIS — E079 Disorder of thyroid, unspecified: Secondary | ICD-10-CM | POA: Diagnosis not present

## 2022-10-19 DIAGNOSIS — R6520 Severe sepsis without septic shock: Secondary | ICD-10-CM | POA: Diagnosis not present

## 2022-10-19 DIAGNOSIS — R5381 Other malaise: Secondary | ICD-10-CM | POA: Diagnosis not present

## 2022-10-19 DIAGNOSIS — I959 Hypotension, unspecified: Secondary | ICD-10-CM | POA: Diagnosis not present

## 2022-10-19 DIAGNOSIS — F02B0 Dementia in other diseases classified elsewhere, moderate, without behavioral disturbance, psychotic disturbance, mood disturbance, and anxiety: Secondary | ICD-10-CM | POA: Diagnosis not present

## 2022-10-19 DIAGNOSIS — R531 Weakness: Secondary | ICD-10-CM | POA: Diagnosis not present

## 2022-10-19 DIAGNOSIS — M62522 Muscle wasting and atrophy, not elsewhere classified, left upper arm: Secondary | ICD-10-CM | POA: Diagnosis not present

## 2022-10-19 DIAGNOSIS — R410 Disorientation, unspecified: Secondary | ICD-10-CM | POA: Diagnosis not present

## 2022-10-19 DIAGNOSIS — G9341 Metabolic encephalopathy: Secondary | ICD-10-CM | POA: Diagnosis not present

## 2022-10-19 DIAGNOSIS — N3000 Acute cystitis without hematuria: Secondary | ICD-10-CM | POA: Diagnosis not present

## 2022-10-19 DIAGNOSIS — I10 Essential (primary) hypertension: Secondary | ICD-10-CM | POA: Diagnosis not present

## 2022-10-19 DIAGNOSIS — E872 Acidosis, unspecified: Secondary | ICD-10-CM | POA: Diagnosis not present

## 2022-10-19 DIAGNOSIS — I700 Atherosclerosis of aorta: Secondary | ICD-10-CM | POA: Diagnosis not present

## 2022-10-19 DIAGNOSIS — R41841 Cognitive communication deficit: Secondary | ICD-10-CM | POA: Diagnosis not present

## 2022-10-19 DIAGNOSIS — S0240EA Zygomatic fracture, right side, initial encounter for closed fracture: Secondary | ICD-10-CM | POA: Diagnosis not present

## 2022-10-19 DIAGNOSIS — I5030 Unspecified diastolic (congestive) heart failure: Secondary | ICD-10-CM | POA: Diagnosis not present

## 2022-10-19 DIAGNOSIS — R1310 Dysphagia, unspecified: Secondary | ICD-10-CM | POA: Diagnosis not present

## 2022-10-19 DIAGNOSIS — R6889 Other general symptoms and signs: Secondary | ICD-10-CM | POA: Diagnosis not present

## 2022-10-19 DIAGNOSIS — R4182 Altered mental status, unspecified: Secondary | ICD-10-CM | POA: Diagnosis not present

## 2022-10-19 DIAGNOSIS — G319 Degenerative disease of nervous system, unspecified: Secondary | ICD-10-CM | POA: Diagnosis not present

## 2022-10-19 DIAGNOSIS — E870 Hyperosmolality and hypernatremia: Secondary | ICD-10-CM | POA: Diagnosis not present

## 2022-10-19 DIAGNOSIS — I5032 Chronic diastolic (congestive) heart failure: Secondary | ICD-10-CM | POA: Diagnosis not present

## 2022-10-19 DIAGNOSIS — I130 Hypertensive heart and chronic kidney disease with heart failure and stage 1 through stage 4 chronic kidney disease, or unspecified chronic kidney disease: Secondary | ICD-10-CM | POA: Diagnosis not present

## 2022-10-19 DIAGNOSIS — I6782 Cerebral ischemia: Secondary | ICD-10-CM | POA: Diagnosis not present

## 2022-10-19 DIAGNOSIS — G9340 Encephalopathy, unspecified: Secondary | ICD-10-CM | POA: Diagnosis not present

## 2022-10-19 DIAGNOSIS — N179 Acute kidney failure, unspecified: Secondary | ICD-10-CM | POA: Diagnosis not present

## 2022-10-19 DIAGNOSIS — R55 Syncope and collapse: Secondary | ICD-10-CM | POA: Diagnosis not present

## 2022-10-19 DIAGNOSIS — R2689 Other abnormalities of gait and mobility: Secondary | ICD-10-CM | POA: Diagnosis not present

## 2022-10-19 DIAGNOSIS — S0292XD Unspecified fracture of facial bones, subsequent encounter for fracture with routine healing: Secondary | ICD-10-CM | POA: Diagnosis not present

## 2022-10-19 DIAGNOSIS — E039 Hypothyroidism, unspecified: Secondary | ICD-10-CM | POA: Diagnosis not present

## 2022-10-19 DIAGNOSIS — S0240DA Maxillary fracture, left side, initial encounter for closed fracture: Secondary | ICD-10-CM | POA: Diagnosis not present

## 2022-10-19 DIAGNOSIS — G309 Alzheimer's disease, unspecified: Secondary | ICD-10-CM | POA: Diagnosis not present

## 2022-10-19 DIAGNOSIS — A419 Sepsis, unspecified organism: Secondary | ICD-10-CM | POA: Diagnosis not present

## 2022-10-19 DIAGNOSIS — E440 Moderate protein-calorie malnutrition: Secondary | ICD-10-CM | POA: Diagnosis not present

## 2022-10-19 DIAGNOSIS — Z20822 Contact with and (suspected) exposure to covid-19: Secondary | ICD-10-CM | POA: Diagnosis not present

## 2022-10-19 DIAGNOSIS — J9811 Atelectasis: Secondary | ICD-10-CM | POA: Diagnosis not present

## 2022-10-19 DIAGNOSIS — R7989 Other specified abnormal findings of blood chemistry: Secondary | ICD-10-CM | POA: Diagnosis not present

## 2022-10-19 DIAGNOSIS — B952 Enterococcus as the cause of diseases classified elsewhere: Secondary | ICD-10-CM | POA: Diagnosis not present

## 2022-10-19 DIAGNOSIS — R9389 Abnormal findings on diagnostic imaging of other specified body structures: Secondary | ICD-10-CM | POA: Diagnosis not present

## 2022-10-19 DIAGNOSIS — Z043 Encounter for examination and observation following other accident: Secondary | ICD-10-CM | POA: Diagnosis not present

## 2022-10-19 DIAGNOSIS — S0292XA Unspecified fracture of facial bones, initial encounter for closed fracture: Secondary | ICD-10-CM | POA: Diagnosis not present

## 2022-10-19 DIAGNOSIS — S022XXA Fracture of nasal bones, initial encounter for closed fracture: Secondary | ICD-10-CM | POA: Diagnosis not present

## 2022-10-19 DIAGNOSIS — R6521 Severe sepsis with septic shock: Secondary | ICD-10-CM | POA: Diagnosis not present

## 2022-10-19 DIAGNOSIS — I222 Subsequent non-ST elevation (NSTEMI) myocardial infarction: Secondary | ICD-10-CM | POA: Diagnosis not present

## 2022-10-19 DIAGNOSIS — E1122 Type 2 diabetes mellitus with diabetic chronic kidney disease: Secondary | ICD-10-CM | POA: Diagnosis not present

## 2022-10-19 DIAGNOSIS — N184 Chronic kidney disease, stage 4 (severe): Secondary | ICD-10-CM | POA: Diagnosis not present

## 2022-10-19 DIAGNOSIS — Z743 Need for continuous supervision: Secondary | ICD-10-CM | POA: Diagnosis not present

## 2022-10-19 DIAGNOSIS — A4151 Sepsis due to Escherichia coli [E. coli]: Secondary | ICD-10-CM | POA: Diagnosis not present

## 2022-10-19 DIAGNOSIS — I21A1 Myocardial infarction type 2: Secondary | ICD-10-CM | POA: Diagnosis not present

## 2022-10-19 DIAGNOSIS — E860 Dehydration: Secondary | ICD-10-CM | POA: Diagnosis not present

## 2022-10-20 DIAGNOSIS — F02B0 Dementia in other diseases classified elsewhere, moderate, without behavioral disturbance, psychotic disturbance, mood disturbance, and anxiety: Secondary | ICD-10-CM | POA: Diagnosis not present

## 2022-10-20 DIAGNOSIS — R7989 Other specified abnormal findings of blood chemistry: Secondary | ICD-10-CM | POA: Diagnosis not present

## 2022-10-20 DIAGNOSIS — E860 Dehydration: Secondary | ICD-10-CM | POA: Diagnosis not present

## 2022-10-20 DIAGNOSIS — N3000 Acute cystitis without hematuria: Secondary | ICD-10-CM | POA: Diagnosis not present

## 2022-10-20 DIAGNOSIS — I959 Hypotension, unspecified: Secondary | ICD-10-CM | POA: Diagnosis not present

## 2022-10-20 DIAGNOSIS — R55 Syncope and collapse: Secondary | ICD-10-CM | POA: Diagnosis not present

## 2022-10-20 DIAGNOSIS — S0292XA Unspecified fracture of facial bones, initial encounter for closed fracture: Secondary | ICD-10-CM | POA: Diagnosis not present

## 2022-10-20 DIAGNOSIS — G309 Alzheimer's disease, unspecified: Secondary | ICD-10-CM | POA: Diagnosis not present

## 2022-10-20 DIAGNOSIS — E872 Acidosis, unspecified: Secondary | ICD-10-CM | POA: Diagnosis not present

## 2022-10-28 DIAGNOSIS — I214 Non-ST elevation (NSTEMI) myocardial infarction: Secondary | ICD-10-CM | POA: Diagnosis not present

## 2022-10-28 DIAGNOSIS — I10 Essential (primary) hypertension: Secondary | ICD-10-CM | POA: Diagnosis not present

## 2022-10-28 DIAGNOSIS — S0292XD Unspecified fracture of facial bones, subsequent encounter for fracture with routine healing: Secondary | ICD-10-CM | POA: Diagnosis not present

## 2022-10-28 DIAGNOSIS — E872 Acidosis, unspecified: Secondary | ICD-10-CM | POA: Diagnosis not present

## 2022-10-28 DIAGNOSIS — I222 Subsequent non-ST elevation (NSTEMI) myocardial infarction: Secondary | ICD-10-CM | POA: Diagnosis not present

## 2022-10-28 DIAGNOSIS — F02B0 Dementia in other diseases classified elsewhere, moderate, without behavioral disturbance, psychotic disturbance, mood disturbance, and anxiety: Secondary | ICD-10-CM | POA: Diagnosis not present

## 2022-10-28 DIAGNOSIS — I5032 Chronic diastolic (congestive) heart failure: Secondary | ICD-10-CM | POA: Diagnosis not present

## 2022-10-28 DIAGNOSIS — R41841 Cognitive communication deficit: Secondary | ICD-10-CM | POA: Diagnosis not present

## 2022-10-28 DIAGNOSIS — M6281 Muscle weakness (generalized): Secondary | ICD-10-CM | POA: Diagnosis not present

## 2022-10-28 DIAGNOSIS — G309 Alzheimer's disease, unspecified: Secondary | ICD-10-CM | POA: Diagnosis not present

## 2022-10-28 DIAGNOSIS — R55 Syncope and collapse: Secondary | ICD-10-CM | POA: Diagnosis not present

## 2022-10-28 DIAGNOSIS — E079 Disorder of thyroid, unspecified: Secondary | ICD-10-CM | POA: Diagnosis not present

## 2022-10-28 DIAGNOSIS — N3000 Acute cystitis without hematuria: Secondary | ICD-10-CM | POA: Diagnosis not present

## 2022-10-28 DIAGNOSIS — R1310 Dysphagia, unspecified: Secondary | ICD-10-CM | POA: Diagnosis not present

## 2022-10-28 DIAGNOSIS — R2689 Other abnormalities of gait and mobility: Secondary | ICD-10-CM | POA: Diagnosis not present

## 2022-10-28 DIAGNOSIS — Z743 Need for continuous supervision: Secondary | ICD-10-CM | POA: Diagnosis not present

## 2022-10-28 DIAGNOSIS — M62522 Muscle wasting and atrophy, not elsewhere classified, left upper arm: Secondary | ICD-10-CM | POA: Diagnosis not present

## 2022-10-28 DIAGNOSIS — M62521 Muscle wasting and atrophy, not elsewhere classified, right upper arm: Secondary | ICD-10-CM | POA: Diagnosis not present

## 2022-10-28 DIAGNOSIS — R531 Weakness: Secondary | ICD-10-CM | POA: Diagnosis not present

## 2022-10-28 DIAGNOSIS — F0280 Dementia in other diseases classified elsewhere without behavioral disturbance: Secondary | ICD-10-CM | POA: Diagnosis not present

## 2022-10-28 DIAGNOSIS — E1122 Type 2 diabetes mellitus with diabetic chronic kidney disease: Secondary | ICD-10-CM | POA: Diagnosis not present

## 2022-10-28 DIAGNOSIS — I959 Hypotension, unspecified: Secondary | ICD-10-CM | POA: Diagnosis not present

## 2022-10-28 DIAGNOSIS — R7989 Other specified abnormal findings of blood chemistry: Secondary | ICD-10-CM | POA: Diagnosis not present

## 2022-10-28 DIAGNOSIS — R5381 Other malaise: Secondary | ICD-10-CM | POA: Diagnosis not present

## 2022-10-31 DIAGNOSIS — I214 Non-ST elevation (NSTEMI) myocardial infarction: Secondary | ICD-10-CM | POA: Diagnosis not present

## 2022-10-31 DIAGNOSIS — G309 Alzheimer's disease, unspecified: Secondary | ICD-10-CM | POA: Diagnosis not present

## 2022-10-31 DIAGNOSIS — I5032 Chronic diastolic (congestive) heart failure: Secondary | ICD-10-CM | POA: Diagnosis not present

## 2022-10-31 DIAGNOSIS — E1122 Type 2 diabetes mellitus with diabetic chronic kidney disease: Secondary | ICD-10-CM | POA: Diagnosis not present

## 2022-10-31 DIAGNOSIS — F0280 Dementia in other diseases classified elsewhere without behavioral disturbance: Secondary | ICD-10-CM | POA: Diagnosis not present

## 2022-11-20 DIAGNOSIS — D631 Anemia in chronic kidney disease: Secondary | ICD-10-CM | POA: Diagnosis not present

## 2022-11-20 DIAGNOSIS — G309 Alzheimer's disease, unspecified: Secondary | ICD-10-CM | POA: Diagnosis not present

## 2022-11-20 DIAGNOSIS — N183 Chronic kidney disease, stage 3 unspecified: Secondary | ICD-10-CM | POA: Diagnosis not present

## 2022-11-20 DIAGNOSIS — I5032 Chronic diastolic (congestive) heart failure: Secondary | ICD-10-CM | POA: Diagnosis not present

## 2022-11-20 DIAGNOSIS — I130 Hypertensive heart and chronic kidney disease with heart failure and stage 1 through stage 4 chronic kidney disease, or unspecified chronic kidney disease: Secondary | ICD-10-CM | POA: Diagnosis not present

## 2022-11-20 DIAGNOSIS — E1122 Type 2 diabetes mellitus with diabetic chronic kidney disease: Secondary | ICD-10-CM | POA: Diagnosis not present

## 2022-11-20 DIAGNOSIS — N3000 Acute cystitis without hematuria: Secondary | ICD-10-CM | POA: Diagnosis not present

## 2022-11-20 DIAGNOSIS — I222 Subsequent non-ST elevation (NSTEMI) myocardial infarction: Secondary | ICD-10-CM | POA: Diagnosis not present

## 2022-11-20 DIAGNOSIS — A4181 Sepsis due to Enterococcus: Secondary | ICD-10-CM | POA: Diagnosis not present

## 2022-11-22 DIAGNOSIS — G309 Alzheimer's disease, unspecified: Secondary | ICD-10-CM | POA: Diagnosis not present

## 2022-11-22 DIAGNOSIS — I222 Subsequent non-ST elevation (NSTEMI) myocardial infarction: Secondary | ICD-10-CM | POA: Diagnosis not present

## 2022-11-22 DIAGNOSIS — I5032 Chronic diastolic (congestive) heart failure: Secondary | ICD-10-CM | POA: Diagnosis not present

## 2022-11-22 DIAGNOSIS — N3000 Acute cystitis without hematuria: Secondary | ICD-10-CM | POA: Diagnosis not present

## 2022-11-22 DIAGNOSIS — N183 Chronic kidney disease, stage 3 unspecified: Secondary | ICD-10-CM | POA: Diagnosis not present

## 2022-11-22 DIAGNOSIS — D631 Anemia in chronic kidney disease: Secondary | ICD-10-CM | POA: Diagnosis not present

## 2022-11-22 DIAGNOSIS — E1122 Type 2 diabetes mellitus with diabetic chronic kidney disease: Secondary | ICD-10-CM | POA: Diagnosis not present

## 2022-11-22 DIAGNOSIS — A4181 Sepsis due to Enterococcus: Secondary | ICD-10-CM | POA: Diagnosis not present

## 2022-11-22 DIAGNOSIS — I130 Hypertensive heart and chronic kidney disease with heart failure and stage 1 through stage 4 chronic kidney disease, or unspecified chronic kidney disease: Secondary | ICD-10-CM | POA: Diagnosis not present

## 2022-11-27 ENCOUNTER — Emergency Department (HOSPITAL_COMMUNITY): Payer: Medicare PPO

## 2022-11-27 ENCOUNTER — Other Ambulatory Visit: Payer: Self-pay

## 2022-11-27 ENCOUNTER — Inpatient Hospital Stay (HOSPITAL_COMMUNITY)
Admission: EM | Admit: 2022-11-27 | Discharge: 2022-12-18 | DRG: 070 | Disposition: A | Discharge status: Skilled Nursing Facility | Payer: Medicare PPO | Attending: Internal Medicine | Admitting: Internal Medicine

## 2022-11-27 ENCOUNTER — Encounter (HOSPITAL_COMMUNITY): Payer: Self-pay | Admitting: *Deleted

## 2022-11-27 DIAGNOSIS — E11649 Type 2 diabetes mellitus with hypoglycemia without coma: Secondary | ICD-10-CM | POA: Diagnosis not present

## 2022-11-27 DIAGNOSIS — R64 Cachexia: Secondary | ICD-10-CM | POA: Diagnosis present

## 2022-11-27 DIAGNOSIS — E1165 Type 2 diabetes mellitus with hyperglycemia: Secondary | ICD-10-CM | POA: Diagnosis not present

## 2022-11-27 DIAGNOSIS — N3090 Cystitis, unspecified without hematuria: Secondary | ICD-10-CM | POA: Diagnosis present

## 2022-11-27 DIAGNOSIS — K529 Noninfective gastroenteritis and colitis, unspecified: Secondary | ICD-10-CM | POA: Diagnosis not present

## 2022-11-27 DIAGNOSIS — N184 Chronic kidney disease, stage 4 (severe): Secondary | ICD-10-CM | POA: Diagnosis present

## 2022-11-27 DIAGNOSIS — N390 Urinary tract infection, site not specified: Secondary | ICD-10-CM | POA: Diagnosis present

## 2022-11-27 DIAGNOSIS — R197 Diarrhea, unspecified: Secondary | ICD-10-CM | POA: Diagnosis not present

## 2022-11-27 DIAGNOSIS — K5641 Fecal impaction: Secondary | ICD-10-CM | POA: Diagnosis present

## 2022-11-27 DIAGNOSIS — G9341 Metabolic encephalopathy: Secondary | ICD-10-CM | POA: Diagnosis not present

## 2022-11-27 DIAGNOSIS — F02C0 Dementia in other diseases classified elsewhere, severe, without behavioral disturbance, psychotic disturbance, mood disturbance, and anxiety: Secondary | ICD-10-CM | POA: Diagnosis present

## 2022-11-27 DIAGNOSIS — K5909 Other constipation: Secondary | ICD-10-CM | POA: Diagnosis present

## 2022-11-27 DIAGNOSIS — N179 Acute kidney failure, unspecified: Secondary | ICD-10-CM | POA: Diagnosis present

## 2022-11-27 DIAGNOSIS — R609 Edema, unspecified: Secondary | ICD-10-CM | POA: Diagnosis not present

## 2022-11-27 DIAGNOSIS — Z7189 Other specified counseling: Secondary | ICD-10-CM | POA: Diagnosis not present

## 2022-11-27 DIAGNOSIS — R54 Age-related physical debility: Secondary | ICD-10-CM | POA: Diagnosis present

## 2022-11-27 DIAGNOSIS — E039 Hypothyroidism, unspecified: Secondary | ICD-10-CM | POA: Diagnosis present

## 2022-11-27 DIAGNOSIS — E43 Unspecified severe protein-calorie malnutrition: Secondary | ICD-10-CM | POA: Diagnosis not present

## 2022-11-27 DIAGNOSIS — K8689 Other specified diseases of pancreas: Secondary | ICD-10-CM | POA: Diagnosis not present

## 2022-11-27 DIAGNOSIS — Z79890 Hormone replacement therapy (postmenopausal): Secondary | ICD-10-CM

## 2022-11-27 DIAGNOSIS — E876 Hypokalemia: Secondary | ICD-10-CM | POA: Diagnosis present

## 2022-11-27 DIAGNOSIS — M810 Age-related osteoporosis without current pathological fracture: Secondary | ICD-10-CM | POA: Diagnosis present

## 2022-11-27 DIAGNOSIS — I5030 Unspecified diastolic (congestive) heart failure: Secondary | ICD-10-CM | POA: Diagnosis present

## 2022-11-27 DIAGNOSIS — Z809 Family history of malignant neoplasm, unspecified: Secondary | ICD-10-CM

## 2022-11-27 DIAGNOSIS — E860 Dehydration: Secondary | ICD-10-CM | POA: Diagnosis not present

## 2022-11-27 DIAGNOSIS — F0390 Unspecified dementia without behavioral disturbance: Secondary | ICD-10-CM | POA: Diagnosis present

## 2022-11-27 DIAGNOSIS — Z7982 Long term (current) use of aspirin: Secondary | ICD-10-CM

## 2022-11-27 DIAGNOSIS — E1122 Type 2 diabetes mellitus with diabetic chronic kidney disease: Secondary | ICD-10-CM | POA: Diagnosis present

## 2022-11-27 DIAGNOSIS — D631 Anemia in chronic kidney disease: Secondary | ICD-10-CM | POA: Diagnosis present

## 2022-11-27 DIAGNOSIS — Z681 Body mass index (BMI) 19 or less, adult: Secondary | ICD-10-CM | POA: Diagnosis not present

## 2022-11-27 DIAGNOSIS — E782 Mixed hyperlipidemia: Secondary | ICD-10-CM | POA: Diagnosis present

## 2022-11-27 DIAGNOSIS — I10 Essential (primary) hypertension: Secondary | ICD-10-CM | POA: Diagnosis present

## 2022-11-27 DIAGNOSIS — Z8249 Family history of ischemic heart disease and other diseases of the circulatory system: Secondary | ICD-10-CM

## 2022-11-27 DIAGNOSIS — F0280 Dementia in other diseases classified elsewhere without behavioral disturbance: Secondary | ICD-10-CM | POA: Diagnosis not present

## 2022-11-27 DIAGNOSIS — Z515 Encounter for palliative care: Secondary | ICD-10-CM

## 2022-11-27 DIAGNOSIS — E785 Hyperlipidemia, unspecified: Secondary | ICD-10-CM | POA: Diagnosis present

## 2022-11-27 DIAGNOSIS — Z888 Allergy status to other drugs, medicaments and biological substances status: Secondary | ICD-10-CM

## 2022-11-27 DIAGNOSIS — N1831 Chronic kidney disease, stage 3a: Secondary | ICD-10-CM | POA: Diagnosis present

## 2022-11-27 DIAGNOSIS — Z66 Do not resuscitate: Secondary | ICD-10-CM | POA: Diagnosis present

## 2022-11-27 DIAGNOSIS — D62 Acute posthemorrhagic anemia: Secondary | ICD-10-CM | POA: Diagnosis not present

## 2022-11-27 DIAGNOSIS — K5289 Other specified noninfective gastroenteritis and colitis: Secondary | ICD-10-CM | POA: Diagnosis present

## 2022-11-27 DIAGNOSIS — R627 Adult failure to thrive: Secondary | ICD-10-CM | POA: Diagnosis not present

## 2022-11-27 DIAGNOSIS — G309 Alzheimer's disease, unspecified: Secondary | ICD-10-CM | POA: Diagnosis present

## 2022-11-27 DIAGNOSIS — I5032 Chronic diastolic (congestive) heart failure: Secondary | ICD-10-CM | POA: Diagnosis not present

## 2022-11-27 DIAGNOSIS — I130 Hypertensive heart and chronic kidney disease with heart failure and stage 1 through stage 4 chronic kidney disease, or unspecified chronic kidney disease: Secondary | ICD-10-CM | POA: Diagnosis present

## 2022-11-27 DIAGNOSIS — Z833 Family history of diabetes mellitus: Secondary | ICD-10-CM

## 2022-11-27 DIAGNOSIS — R933 Abnormal findings on diagnostic imaging of other parts of digestive tract: Secondary | ICD-10-CM | POA: Diagnosis not present

## 2022-11-27 DIAGNOSIS — Z87440 Personal history of urinary (tract) infections: Secondary | ICD-10-CM

## 2022-11-27 DIAGNOSIS — I959 Hypotension, unspecified: Secondary | ICD-10-CM | POA: Diagnosis not present

## 2022-11-27 DIAGNOSIS — N281 Cyst of kidney, acquired: Secondary | ICD-10-CM | POA: Diagnosis not present

## 2022-11-27 DIAGNOSIS — A419 Sepsis, unspecified organism: Principal | ICD-10-CM | POA: Diagnosis present

## 2022-11-27 DIAGNOSIS — R531 Weakness: Secondary | ICD-10-CM | POA: Diagnosis not present

## 2022-11-27 DIAGNOSIS — Z79899 Other long term (current) drug therapy: Secondary | ICD-10-CM

## 2022-11-27 DIAGNOSIS — Z7401 Bed confinement status: Secondary | ICD-10-CM | POA: Diagnosis not present

## 2022-11-27 DIAGNOSIS — K625 Hemorrhage of anus and rectum: Secondary | ICD-10-CM | POA: Diagnosis not present

## 2022-11-27 DIAGNOSIS — R739 Hyperglycemia, unspecified: Secondary | ICD-10-CM | POA: Diagnosis present

## 2022-11-27 HISTORY — DX: Unspecified dementia, unspecified severity, without behavioral disturbance, psychotic disturbance, mood disturbance, and anxiety: F03.90

## 2022-11-27 LAB — CBC WITH DIFFERENTIAL/PLATELET
Abs Immature Granulocytes: 0.03 10*3/uL (ref 0.00–0.07)
Basophils Absolute: 0 10*3/uL (ref 0.0–0.1)
Basophils Relative: 0 %
Eosinophils Absolute: 0 10*3/uL (ref 0.0–0.5)
Eosinophils Relative: 0 %
HCT: 39.9 % (ref 36.0–46.0)
Hemoglobin: 12.6 g/dL (ref 12.0–15.0)
Immature Granulocytes: 0 %
Lymphocytes Relative: 8 %
Lymphs Abs: 0.7 10*3/uL (ref 0.7–4.0)
MCH: 27.2 pg (ref 26.0–34.0)
MCHC: 31.6 g/dL (ref 30.0–36.0)
MCV: 86.2 fL (ref 80.0–100.0)
Monocytes Absolute: 0.9 10*3/uL (ref 0.1–1.0)
Monocytes Relative: 10 %
Neutro Abs: 7.1 10*3/uL (ref 1.7–7.7)
Neutrophils Relative %: 82 %
Platelets: 320 10*3/uL (ref 150–400)
RBC: 4.63 MIL/uL (ref 3.87–5.11)
RDW: 14.2 % (ref 11.5–15.5)
WBC: 8.7 10*3/uL (ref 4.0–10.5)
nRBC: 0 % (ref 0.0–0.2)

## 2022-11-27 LAB — URINALYSIS, ROUTINE W REFLEX MICROSCOPIC
Bilirubin Urine: NEGATIVE
Glucose, UA: NEGATIVE mg/dL
Ketones, ur: NEGATIVE mg/dL
Nitrite: POSITIVE — AB
Protein, ur: 30 mg/dL — AB
Specific Gravity, Urine: 1.014 (ref 1.005–1.030)
WBC, UA: >50 WBC/hpf (ref 0–5)
pH: 6 (ref 5.0–8.0)

## 2022-11-27 LAB — COMPREHENSIVE METABOLIC PANEL
ALT: 19 U/L (ref 0–44)
AST: 29 U/L (ref 15–41)
Albumin: 3.7 g/dL (ref 3.5–5.0)
Alkaline Phosphatase: 107 U/L (ref 38–126)
Anion gap: 11 (ref 5–15)
BUN: 55 mg/dL — ABNORMAL HIGH (ref 8–23)
CO2: 23 mmol/L (ref 22–32)
Calcium: 9.2 mg/dL (ref 8.9–10.3)
Chloride: 100 mmol/L (ref 98–111)
Creatinine, Ser: 1.83 mg/dL — ABNORMAL HIGH (ref 0.44–1.00)
GFR, Estimated: 27 mL/min — ABNORMAL LOW (ref >60)
Glucose, Bld: 243 mg/dL — ABNORMAL HIGH (ref 70–99)
Potassium: 4.1 mmol/L (ref 3.5–5.1)
Sodium: 134 mmol/L — ABNORMAL LOW (ref 135–145)
Total Bilirubin: 0.8 mg/dL (ref 0.3–1.2)
Total Protein: 8.5 g/dL — ABNORMAL HIGH (ref 6.5–8.1)

## 2022-11-27 LAB — TYPE AND SCREEN
ABO/RH(D): O POS
Antibody Screen: NEGATIVE

## 2022-11-27 LAB — I-STAT CHEM 8, ED
BUN: 58 mg/dL — ABNORMAL HIGH (ref 8–23)
Calcium, Ion: 1.15 mmol/L (ref 1.15–1.40)
Chloride: 103 mmol/L (ref 98–111)
Creatinine, Ser: 1.9 mg/dL — ABNORMAL HIGH (ref 0.44–1.00)
Glucose, Bld: 232 mg/dL — ABNORMAL HIGH (ref 70–99)
HCT: 45 % (ref 36.0–46.0)
Hemoglobin: 15.3 g/dL — ABNORMAL HIGH (ref 12.0–15.0)
Potassium: 4.3 mmol/L (ref 3.5–5.1)
Sodium: 137 mmol/L (ref 135–145)
TCO2: 25 mmol/L (ref 22–32)

## 2022-11-27 LAB — LACTIC ACID, PLASMA
Lactic Acid, Venous: 1.1 mmol/L (ref 0.5–1.9)
Lactic Acid, Venous: 1.3 mmol/L (ref 0.5–1.9)

## 2022-11-27 LAB — PROTIME-INR
INR: 1.1 (ref 0.8–1.2)
Prothrombin Time: 14.1 seconds (ref 11.4–15.2)

## 2022-11-27 LAB — LIPASE, BLOOD: Lipase: 32 U/L (ref 11–51)

## 2022-11-27 LAB — MAGNESIUM: Magnesium: 2.2 mg/dL (ref 1.7–2.4)

## 2022-11-27 LAB — POC OCCULT BLOOD, ED: Fecal Occult Bld: POSITIVE — AB

## 2022-11-27 MED ORDER — SODIUM CHLORIDE 0.9 % IV SOLN
1.0000 g | Freq: Once | INTRAVENOUS | Status: AC
  Administered 2022-11-28: 1 g via INTRAVENOUS
  Filled 2022-11-27: qty 10

## 2022-11-27 MED ORDER — PANTOPRAZOLE SODIUM 40 MG IV SOLR
40.0000 mg | Freq: Two times a day (BID) | INTRAVENOUS | Status: DC
  Administered 2022-11-27 – 2022-11-29 (×4): 40 mg via INTRAVENOUS
  Filled 2022-11-27 (×4): qty 10

## 2022-11-27 MED ORDER — METRONIDAZOLE 500 MG/100ML IV SOLN
500.0000 mg | Freq: Once | INTRAVENOUS | Status: AC
  Administered 2022-11-27: 500 mg via INTRAVENOUS
  Filled 2022-11-27: qty 100

## 2022-11-27 MED ORDER — FENTANYL CITRATE PF 50 MCG/ML IJ SOSY
25.0000 ug | PREFILLED_SYRINGE | Freq: Once | INTRAMUSCULAR | Status: AC
  Administered 2022-11-27: 25 ug via INTRAVENOUS
  Filled 2022-11-27: qty 1

## 2022-11-27 MED ORDER — LACTATED RINGERS IV BOLUS
500.0000 mL | Freq: Once | INTRAVENOUS | Status: AC
  Administered 2022-11-27: 500 mL via INTRAVENOUS

## 2022-11-27 MED ORDER — NOREPINEPHRINE 4 MG/250ML-% IV SOLN
0.0000 ug/min | INTRAVENOUS | Status: DC
  Administered 2022-11-27 (×2): 2 ug/min via INTRAVENOUS
  Filled 2022-11-27: qty 250

## 2022-11-27 MED ORDER — MIDAZOLAM HCL 5 MG/5ML IJ SOLN
1.0000 mg | Freq: Once | INTRAMUSCULAR | Status: AC
  Administered 2022-11-27: 1 mg via INTRAVENOUS
  Filled 2022-11-27: qty 5

## 2022-11-27 MED ORDER — LACTATED RINGERS IV BOLUS
1000.0000 mL | Freq: Once | INTRAVENOUS | Status: AC
  Administered 2022-11-27: 1000 mL via INTRAVENOUS

## 2022-11-27 NOTE — ED Triage Notes (Addendum)
Pt with diarrhea off and on for past few days, EMS reported pt with dilated rectum. Rectal pain as well. Pt is from home and has dementia.

## 2022-11-27 NOTE — ED Notes (Signed)
EDP in room for rectal exam, rectum dilated and noted with hard stool.

## 2022-11-27 NOTE — ED Notes (Signed)
Lab called and stated that pink tube hemolyzed and will need to redraw.

## 2022-11-27 NOTE — ED Provider Notes (Signed)
[place] EMERGENCY DEPARTMENT AT [facility] Provider Note   CSN: [id number] Arrival date & time: 11/27/22  1752     History {Add pertinent medical, surgical, social history, OB history to HPI:1} No chief complaint on file.   Lindsey Mcguire is a 88 y.o. female.  HPI Patient presents for concern of rectal bleeding.  She arrives from home by EMS.  She has dementia.  Her husband passed away last night.  Family noticed some blood in her stool this morning.  EMS did not see any blood but did see diarrhea.  Family states that diarrhea has been ongoing for the last several days.  Family states the patient is at her mental baseline currently.  History from patient is limited due to her dementia.    Home Medications Prior to Admission medications   Medication Sig Start Date End Date Taking? Authorizing Provider  aspirin 81 MG tablet Take 81 mg by mouth daily.     [provider]  atorvastatin (LIPITOR) 40 MG tablet Take 40 mg by mouth daily.    [provider]  calcium carbonate (OS-CAL) 600 MG TABS tablet Take 600 mg by mouth 2 (two) times daily with a meal.    [provider]  cholecalciferol (VITAMIN D3) 25 MCG (1000 UT) tablet Take 1,000 Units by mouth daily.    [provider]  ferrous sulfate 325 (65 FE) MG tablet Take 650 mg by mouth daily with breakfast.     [provider]  levothyroxine (SYNTHROID) 75 MCG tablet Take 75 mcg by mouth every morning. 03/19/19   [provider]  Multiple Vitamin (MULTIVITAMIN) tablet Take 1 tablet by mouth daily.    [provider]  olmesartan (BENICAR) 40 MG tablet Take 40 mg by mouth daily.    [provider]  Polyethyl Glycol-Propyl Glycol (SYSTANE OP) Place 2 drops into both eyes 4 (four) times daily as needed (dryness).     [provider]  Polyethylene Glycol 3350 (MIRALAX PO) Take 17 g by mouth daily as needed (constipation).     [provider]   potassium chloride 20 MEQ TBCR Take 20 mEq by mouth daily. 02/11/19 03/30/19  Sherryll Burger, Pratik D, DO      Allergies    Glimepiride and Rosiglitazone    Review of Systems   Review of Systems  Unable to perform ROS: Dementia    Physical Exam Updated Vital Signs There were no vitals taken for this visit. Physical Exam Vitals and nursing note reviewed. Exam conducted with a chaperone present.  Constitutional:      General: She is not in acute distress.    Appearance: She is well-developed. She is ill-appearing (Chronically). She is not toxic-appearing or diaphoretic.  HENT:     Head: Normocephalic and atraumatic.     Right Ear: External ear normal.     Left Ear: External ear normal.     Nose: Nose normal.  Cardiovascular:     Rate and Rhythm: Normal rate and regular rhythm.     Heart sounds: No murmur heard. Pulmonary:     Effort: Pulmonary effort is normal. No respiratory distress.     Breath sounds: Normal breath sounds. No wheezing or rales.  Chest:     Chest wall: No tenderness.  Abdominal:     General: There is no distension.     Palpations: Abdomen is soft.     Tenderness: There is no abdominal tenderness.  Genitourinary:    Comments: Rectum  is dilated by what appears to be a large stool ball.  Alternatively, this could be an organ prolapse.  Dark-colored diarrhea is present.  This is Hemoccult positive. Musculoskeletal:        General: No swelling.     Cervical back: Normal range of motion and neck supple.  Skin:    General: Skin is warm and dry.     Coloration: Skin is not jaundiced or pale.  Neurological:     General: No focal deficit present.     Mental Status: She is alert. Mental status is at baseline.     GCS: GCS eye subscore is 3. GCS verbal subscore is 1. GCS motor subscore is 6.  Psychiatric:        Mood and Affect: Mood normal.     ED Results / Procedures / Treatments   Labs (all labs ordered are listed, but only abnormal results are displayed) Labs  Reviewed - No data to display  EKG None  Radiology No results found.  Procedures Procedures  {Document cardiac monitor, telemetry assessment procedure when appropriate:1}  Medications Ordered in ED Medications - No data to display  ED Course/ Medical Decision Making/ A&P   {   Click here for ABCD2, HEART and other calculatorsREFRESH Note before signing :1}                              Medical Decision Making Amount and/or Complexity of Data Reviewed Labs: ordered. Radiology: ordered.  Risk Prescription drug management.   This patient presents to the ED for concern of ***, this involves an extensive number of treatment options, and is a complaint that carries with it a high risk of complications and morbidity.  The differential diagnosis includes ***   Co morbidities that complicate the patient evaluation  ***   Additional history obtained:  Additional history obtained from *** External records from outside source obtained and reviewed including ***   Lab Tests:  I Ordered, and personally interpreted labs.  The pertinent results include:  ***   Imaging Studies ordered:  I ordered imaging studies including ***  I independently visualized and interpreted imaging which showed *** I agree with the radiologist interpretation   Cardiac Monitoring: / EKG:  The patient was maintained on a cardiac monitor.  I personally viewed and interpreted the cardiac monitored which showed an underlying rhythm of: ***   Consultations Obtained:  I requested consultation with the ***,  and discussed lab and imaging findings as well as pertinent plan - they recommend: ***   Problem List / ED Course / Critical interventions / Medication management  *** I ordered medication including ***  for ***  Reevaluation of the patient after these medicines showed that the patient {resolved/improved/worsened:23923::"improved"} I have reviewed the patients home medicines and have made  adjustments as needed   Social Determinants of Health:  ***   Test / Admission - Considered:  ***   {Document critical care time when appropriate:1} {Document review of labs and clinical decision tools ie heart score, Chads2Vasc2 etc:1}  {Document your independent review of radiology images, and any outside records:1} {Document your discussion with family members, caretakers, and with consultants:1} {Document social determinants of health affecting pt's care:1} {Document your decision making why or why not admission, treatments were needed:1} Final Clinical Impression(s) / ED Diagnoses Final diagnoses:  None    Rx / DC Orders ED Discharge Orders     None

## 2022-11-27 NOTE — ED Notes (Signed)
Edp made aware of pt's BP and new orders received

## 2022-11-28 ENCOUNTER — Encounter (HOSPITAL_COMMUNITY): Payer: Self-pay | Admitting: Pulmonary Disease

## 2022-11-28 DIAGNOSIS — G309 Alzheimer's disease, unspecified: Secondary | ICD-10-CM | POA: Diagnosis present

## 2022-11-28 DIAGNOSIS — E039 Hypothyroidism, unspecified: Secondary | ICD-10-CM | POA: Diagnosis present

## 2022-11-28 DIAGNOSIS — I959 Hypotension, unspecified: Secondary | ICD-10-CM

## 2022-11-28 DIAGNOSIS — N390 Urinary tract infection, site not specified: Secondary | ICD-10-CM | POA: Diagnosis present

## 2022-11-28 DIAGNOSIS — I130 Hypertensive heart and chronic kidney disease with heart failure and stage 1 through stage 4 chronic kidney disease, or unspecified chronic kidney disease: Secondary | ICD-10-CM | POA: Diagnosis present

## 2022-11-28 DIAGNOSIS — F02C0 Dementia in other diseases classified elsewhere, severe, without behavioral disturbance, psychotic disturbance, mood disturbance, and anxiety: Secondary | ICD-10-CM | POA: Diagnosis present

## 2022-11-28 DIAGNOSIS — F0280 Dementia in other diseases classified elsewhere without behavioral disturbance: Secondary | ICD-10-CM

## 2022-11-28 DIAGNOSIS — Z66 Do not resuscitate: Secondary | ICD-10-CM

## 2022-11-28 DIAGNOSIS — K5909 Other constipation: Secondary | ICD-10-CM | POA: Diagnosis not present

## 2022-11-28 DIAGNOSIS — N184 Chronic kidney disease, stage 4 (severe): Secondary | ICD-10-CM | POA: Diagnosis not present

## 2022-11-28 DIAGNOSIS — N179 Acute kidney failure, unspecified: Secondary | ICD-10-CM | POA: Diagnosis present

## 2022-11-28 DIAGNOSIS — I5032 Chronic diastolic (congestive) heart failure: Secondary | ICD-10-CM

## 2022-11-28 DIAGNOSIS — E1122 Type 2 diabetes mellitus with diabetic chronic kidney disease: Secondary | ICD-10-CM

## 2022-11-28 DIAGNOSIS — R739 Hyperglycemia, unspecified: Secondary | ICD-10-CM | POA: Diagnosis present

## 2022-11-28 DIAGNOSIS — R627 Adult failure to thrive: Secondary | ICD-10-CM

## 2022-11-28 DIAGNOSIS — N1831 Chronic kidney disease, stage 3a: Secondary | ICD-10-CM | POA: Diagnosis present

## 2022-11-28 DIAGNOSIS — E860 Dehydration: Secondary | ICD-10-CM | POA: Diagnosis not present

## 2022-11-28 DIAGNOSIS — E785 Hyperlipidemia, unspecified: Secondary | ICD-10-CM | POA: Diagnosis present

## 2022-11-28 DIAGNOSIS — K5641 Fecal impaction: Secondary | ICD-10-CM | POA: Diagnosis present

## 2022-11-28 DIAGNOSIS — K5289 Other specified noninfective gastroenteritis and colitis: Principal | ICD-10-CM | POA: Diagnosis present

## 2022-11-28 DIAGNOSIS — E782 Mixed hyperlipidemia: Secondary | ICD-10-CM | POA: Diagnosis present

## 2022-11-28 DIAGNOSIS — R64 Cachexia: Secondary | ICD-10-CM | POA: Diagnosis present

## 2022-11-28 DIAGNOSIS — E43 Unspecified severe protein-calorie malnutrition: Secondary | ICD-10-CM | POA: Diagnosis present

## 2022-11-28 DIAGNOSIS — E1165 Type 2 diabetes mellitus with hyperglycemia: Secondary | ICD-10-CM | POA: Diagnosis not present

## 2022-11-28 DIAGNOSIS — Z7189 Other specified counseling: Secondary | ICD-10-CM | POA: Diagnosis not present

## 2022-11-28 DIAGNOSIS — R609 Edema, unspecified: Secondary | ICD-10-CM | POA: Diagnosis not present

## 2022-11-28 DIAGNOSIS — F0390 Unspecified dementia without behavioral disturbance: Secondary | ICD-10-CM | POA: Diagnosis present

## 2022-11-28 DIAGNOSIS — Z515 Encounter for palliative care: Secondary | ICD-10-CM | POA: Diagnosis not present

## 2022-11-28 DIAGNOSIS — G9341 Metabolic encephalopathy: Secondary | ICD-10-CM | POA: Diagnosis not present

## 2022-11-28 DIAGNOSIS — I10 Essential (primary) hypertension: Secondary | ICD-10-CM | POA: Diagnosis present

## 2022-11-28 DIAGNOSIS — Z681 Body mass index (BMI) 19 or less, adult: Secondary | ICD-10-CM | POA: Diagnosis not present

## 2022-11-28 DIAGNOSIS — E11649 Type 2 diabetes mellitus with hypoglycemia without coma: Secondary | ICD-10-CM | POA: Diagnosis not present

## 2022-11-28 DIAGNOSIS — D631 Anemia in chronic kidney disease: Secondary | ICD-10-CM | POA: Diagnosis present

## 2022-11-28 DIAGNOSIS — D62 Acute posthemorrhagic anemia: Secondary | ICD-10-CM | POA: Diagnosis not present

## 2022-11-28 DIAGNOSIS — I5030 Unspecified diastolic (congestive) heart failure: Secondary | ICD-10-CM | POA: Diagnosis present

## 2022-11-28 LAB — CBG MONITORING, ED: Glucose-Capillary: 202 mg/dL — ABNORMAL HIGH (ref 70–99)

## 2022-11-28 LAB — MRSA NEXT GEN BY PCR, NASAL
MRSA by PCR Next Gen: NOT DETECTED
MRSA by PCR Next Gen: NOT DETECTED

## 2022-11-28 LAB — GLUCOSE, CAPILLARY
Glucose-Capillary: 110 mg/dL — ABNORMAL HIGH (ref 70–99)
Glucose-Capillary: 139 mg/dL — ABNORMAL HIGH (ref 70–99)
Glucose-Capillary: 117 mg/dL — ABNORMAL HIGH (ref 70–99)
Glucose-Capillary: 119 mg/dL — ABNORMAL HIGH (ref 70–99)

## 2022-11-28 LAB — HEMOGLOBIN A1C
Hgb A1c MFr Bld: 6 % — ABNORMAL HIGH (ref 4.8–5.6)
Mean Plasma Glucose: 125.5 mg/dL

## 2022-11-28 LAB — LACTIC ACID, PLASMA: Lactic Acid, Venous: 1.3 mmol/L (ref 0.5–1.9)

## 2022-11-28 MED ORDER — CHLORHEXIDINE GLUCONATE CLOTH 2 % EX PADS
6.0000 | MEDICATED_PAD | Freq: Every day | CUTANEOUS | Status: DC
  Administered 2022-11-29 – 2022-12-18 (×18): 6 via TOPICAL

## 2022-11-28 MED ORDER — ONDANSETRON HCL 4 MG/2ML IJ SOLN: 4.0000 mg | Freq: Four times a day (QID) | INTRAMUSCULAR | Status: DC | PRN

## 2022-11-28 MED ORDER — METRONIDAZOLE 500 MG/100ML IV SOLN
500.0000 mg | Freq: Two times a day (BID) | INTRAVENOUS | Status: AC
  Administered 2022-11-28 – 2022-11-30 (×6): 500 mg via INTRAVENOUS
  Filled 2022-11-28 (×6): qty 100

## 2022-11-28 MED ORDER — ORAL CARE MOUTH RINSE
15.0000 mL | OROMUCOSAL | Status: DC
  Administered 2022-11-29 – 2022-12-18 (×72): 15 mL via OROMUCOSAL

## 2022-11-28 MED ORDER — POLYETHYLENE GLYCOL 3350 17 G PO PACK
17.0000 g | PACK | Freq: Two times a day (BID) | ORAL | Status: DC
  Administered 2022-11-29 – 2022-12-04 (×9): 17 g via ORAL
  Filled 2022-11-28 (×12): qty 1

## 2022-11-28 MED ORDER — HEPARIN SODIUM (PORCINE) 5000 UNIT/ML IJ SOLN
5000.0000 [IU] | Freq: Three times a day (TID) | INTRAMUSCULAR | Status: DC
  Administered 2022-11-28 – 2022-12-01 (×9): 5000 [IU] via SUBCUTANEOUS
  Filled 2022-11-28 (×9): qty 1

## 2022-11-28 MED ORDER — ONDANSETRON HCL 4 MG PO TABS: 4.0000 mg | ORAL_TABLET | Freq: Four times a day (QID) | ORAL | Status: DC | PRN

## 2022-11-28 MED ORDER — INSULIN ASPART 100 UNIT/ML IJ SOLN
0.0000 [IU] | Freq: Three times a day (TID) | INTRAMUSCULAR | Status: DC
  Administered 2022-11-28: 3 [IU] via SUBCUTANEOUS
  Filled 2022-11-28: qty 1

## 2022-11-28 MED ORDER — GERHARDT'S BUTT CREAM
TOPICAL_CREAM | Freq: Two times a day (BID) | CUTANEOUS | Status: DC
  Administered 2022-11-29 – 2022-12-13 (×2): 1 via TOPICAL
  Filled 2022-11-28: qty 1

## 2022-11-28 MED ORDER — CHLORHEXIDINE GLUCONATE CLOTH 2 % EX PADS
6.0000 | MEDICATED_PAD | Freq: Every day | CUTANEOUS | Status: DC
  Administered 2022-11-28: 6 via TOPICAL

## 2022-11-28 MED ORDER — DEXTROSE 50 % IV SOLN
12.5000 g | INTRAVENOUS | Status: AC
  Administered 2022-11-28: 12.5 g via INTRAVENOUS

## 2022-11-28 MED ORDER — OXYCODONE HCL 5 MG PO TABS: 5.0000 mg | ORAL_TABLET | Freq: Four times a day (QID) | ORAL | Status: DC | PRN

## 2022-11-28 MED ORDER — LACTATED RINGERS IV BOLUS
1000.0000 mL | Freq: Once | INTRAVENOUS | Status: AC
  Administered 2022-11-28: 1000 mL via INTRAVENOUS

## 2022-11-28 MED ORDER — LACTATED RINGERS IV SOLN: INTRAVENOUS | Status: DC

## 2022-11-28 MED ORDER — ACETAMINOPHEN 325 MG PO TABS
650.0000 mg | ORAL_TABLET | Freq: Four times a day (QID) | ORAL | Status: DC | PRN
  Administered 2022-12-02: 650 mg via ORAL
  Filled 2022-11-28: qty 2

## 2022-11-28 MED ORDER — FENTANYL CITRATE PF 50 MCG/ML IJ SOSY: 12.5000 ug | PREFILLED_SYRINGE | INTRAMUSCULAR | Status: DC | PRN

## 2022-11-28 MED ORDER — SODIUM CHLORIDE 0.9 % IV SOLN: 250.0000 mL | INTRAVENOUS | Status: DC

## 2022-11-28 MED ORDER — ENSURE ENLIVE PO LIQD
237.0000 mL | Freq: Two times a day (BID) | ORAL | Status: DC
  Administered 2022-11-28 – 2022-12-18 (×34): 237 mL via ORAL

## 2022-11-28 MED ORDER — INSULIN ASPART 100 UNIT/ML IJ SOLN
3.0000 [IU] | Freq: Three times a day (TID) | INTRAMUSCULAR | Status: DC
  Administered 2022-11-28: 3 [IU] via SUBCUTANEOUS

## 2022-11-28 MED ORDER — ACETAMINOPHEN 650 MG RE SUPP: 650.0000 mg | Freq: Four times a day (QID) | RECTAL | Status: DC | PRN

## 2022-11-28 MED ORDER — SODIUM CHLORIDE 0.9 % IV SOLN: 1.0000 g | INTRAVENOUS | Status: DC

## 2022-11-28 MED ORDER — ORAL CARE MOUTH RINSE: 15.0000 mL | OROMUCOSAL | Status: DC | PRN

## 2022-11-28 MED ORDER — INSULIN GLARGINE-YFGN 100 UNIT/ML ~~LOC~~ SOLN
10.0000 [IU] | Freq: Every day | SUBCUTANEOUS | Status: DC
  Filled 2022-11-28 (×2): qty 0.1

## 2022-11-28 MED ORDER — SODIUM CHLORIDE 0.9 % IV SOLN
2.0000 g | INTRAVENOUS | Status: DC
  Administered 2022-11-28: 2 g via INTRAVENOUS
  Filled 2022-11-28: qty 20

## 2022-11-28 MED ORDER — NOREPINEPHRINE 4 MG/250ML-% IV SOLN: 2.0000 ug/min | INTRAVENOUS | Status: DC

## 2022-11-28 MED ORDER — GERHARDT'S BUTT CREAM
TOPICAL_CREAM | Freq: Every day | CUTANEOUS | Status: DC
  Filled 2022-11-28: qty 1

## 2022-11-28 NOTE — Hospital Course (Addendum)
88YOF with hypertension, hyperlipidemia, hypothyroidism, dementia, HFpEF, type 2 diabetes mellitus, anemia, stage IV CKD, osteoporosis who had recently been discharged on 10/28/2022 from [facility] after being treated for dehydration, deconditioning, failure to thrive, weakness, sepsis from cystitis and was subsequently discharged to SNF and remained in therapy for 2 weeks subsequently discharged home.  Her husband recently passed away.  She lives with her son and grandson.  Family reported that she has been having diarrhea for the last several days and noticed some blood in her stool.  They brought her to the ED for further evaluation. Noted to be extremely debilitated,cachectic and had severe fecal impaction with a large stool ball which required disimpaction in the ED and stercoral colitis noted. She was also noted to be hypotensive and dehydrated and eventually required low-dose norepinephrine infusion to maintain blood pressure. She was started on antibiotic therapy and admission requested for further management. Patient was treated for sepsis due to UTI and stercoral colitis, transferred out of ICU 8/4. 8/4-noticed some blood per rectum heparin DVT prophylaxis discontinued CBC ordered and GI was consulted No plan for endoscopic intervention at this time GI has discussed with family consider flex sigmoidoscopy for recurrent bleeding, advised to continue Cort enema for 2 weeks. Peer to peer done 12/11/22- per medical director from [facility], last PT note today reiewed>pt has been unable to progress with gait and not able to participate and resisting, medical director denied snf>? Consider temporary NH under custodial care then PTOT on medicare part B advised- once willing to participate then can try SN, if good support then [facility].  8/19: Medically stable.  Family is trying to get her to SNF as private pay, can be discharged once paperwork is completed.  8/20: Hemodynamically stable.  Family completed the  paperwork for private pay SNF.  She will have bed available from tomorrow.

## 2022-11-28 NOTE — ED Notes (Signed)
When cleaning pt it was noted that pt has a skin tear to right hip

## 2022-11-28 NOTE — Consult Note (Signed)
Consultation Note Date: 11/28/2022   Patient Name: Lindsey Mcguire  DOB: 1935/01/10  MRN: [id number]  Age / Sex: 88 y.o., female  PCP: Lindsey Ishihara, MD Referring Physician: Cleora Fleet, MD  Reason for Consultation: Establishing goals of care  HPI/Patient Profile: 88 y.o. female  with past medical history of dementia, HTN, HLD, HFpEF, hypothyroidism, diabetes, anemia, CKD stage IV, osteoporosis, recent hospitalization for UTI/sepsis 6/22-10/28/22 followed by rehab stay admitted on 11/27/2022 with diarrhea and blood in stool with stercoral colitis with disimpaction in ED. Being treated for UTI, hypotension requiring vasopressor support.   Clinical Assessment and Goals of Care: Consult received and chart review completed. Called to bedside by RN and discussed with Dr. Laural Mcguire and Dr. Katrinka Mcguire. No family present. Lindsey Mcguire is mostly unresponsive - withdraws to painful stimuli. Severe temporal muscle wasting. No urine output - bladder scan with ~220 cc per RN. Leaning to left on pillow and left eye/face appears swollen. Unable to follow commands.   I called and spoke with her son, Lindsey Mcguire. Lindsey Mcguire and I discuss the severity of his mother's condition. She was recently at outside hospital with UTI/sepsis followed by rehab stay and she just returned back home 3-4 days ago where she lives with her son, Lindsey Mcguire, and grandson. Lindsey Mcguire shares that their father (her husband) just died at home. He reports that due to his mother's dementia she was in the home but unaware of what was happening. He reported that she was eating/drinking okay until a few days prior to admission. They felt that she had had some improvement from rehab stay. I discussed with Lindsey Mcguire my concern for his mother's overall health and frailty. I expressed my concern that she may not be strong enough to overcome this illness. I asked if they had ever had discussions  regarding code status and her wishes. Lindsey Mcguire shares that they have not. I recommended that Lindsey Mcguire consider DNR status given her underlying chronic health illnesses and overall frailty. I shared that I do not believe that she would survive resuscitation if she worsened to this point. He will discuss with his family.   Update: I called Lindsey Mcguire back to discuss with him the plan for potential transfer to [facility] to have close follow up with critical care team. He agrees with transfer and they desire ongoing treatment of infection and vasopressor support as needed. He has had a chance to speak with his brother, Lindsey Mcguire, and his aunt, Lindsey Mcguire. They have agreed that they would not want her to be resuscitated or connected to life support and agree with DNR status. He would for his aunt Lindsey Mcguire to be updated on movements and transfer today as he and his brother will be working to settle their father's affairs today.   All questions/concerns addressed. Emotional support provided. Updated medical team.   Primary Decision Maker NEXT OF KIN sons Lindsey Mcguire and Lindsey Mcguire    SUMMARY OF RECOMMENDATIONS   - DNR decided - Continue ICU treatment/interventions - Family agree with transfer to [facility] - Will need  ongoing palliative conversations  Code Status/Advance Care Planning: DNR   Symptom Management:  Per attending, PCCM  Prognosis:  Prognosis poor.   Discharge Planning: To Be Determined      Primary Diagnoses: Present on Admission:  Stercoral colitis  Hypotension  Type 2 diabetes mellitus with stage 4 chronic kidney disease, without long-term current use of insulin (HCC)  (HFpEF) heart failure with preserved ejection fraction (HCC)  Hypertension  Dementia (HCC)  Hypothyroidism  Hyperlipidemia  Fecal impaction (HCC)  Chronic constipation  Chronic kidney disease (CKD), stage IV (severe) (HCC)  Hyperglycemia  UTI (urinary tract infection)   I have reviewed the medical record, interviewed the patient and family, and  examined the patient. The following aspects are pertinent.  Past Medical History:  Diagnosis Date   Anemia 10/28/2003   Dementia (HCC)    Diabetes mellitus    Hypertension    Social History   Socioeconomic History   Marital status: Married    Spouse name: Not on file   Number of children: Not on file   Years of education: Not on file   Highest education level: Not on file  Occupational History   Not on file  Tobacco Use   Smoking status: Never   Smokeless tobacco: Never  Vaping Use   Vaping status: Never Used  Substance and Sexual Activity   Alcohol use: No   Drug use: No   Sexual activity: Not Currently    Partners: Male  Other Topics Concern   Not on file  Social History Narrative   Not on file   Social Determinants of Health   Financial Resource Strain: Not on file  Food Insecurity: Patient Unable To Answer (11/28/2022)   Hunger Vital Sign    Worried About Running Out of Food in the Last Year: Patient unable to answer    Ran Out of Food in the Last Year: Patient unable to answer  Transportation Needs: No Transportation Needs (11/28/2022)   PRAPARE - Administrator, Civil Service (Medical): No    Lack of Transportation (Non-Medical): No  Physical Activity: Not on file  Stress: Not on file  Social Connections: Not on file   Family History  Problem Relation Age of Onset   Diabetes Mother    Hypertension Mother    Cancer Brother    Diabetes Brother    Hypertension Maternal Grandmother    Diabetes Maternal Grandmother    Cancer Brother    Diabetes Brother    Scheduled Meds:  Chlorhexidine Gluconate Cloth  6 each Topical Q0600   feeding supplement  237 mL Oral BID BM   insulin aspart  0-9 Units Subcutaneous TID WC   insulin aspart  3 Units Subcutaneous TID WC   insulin glargine-yfgn  10 Units Subcutaneous Daily   pantoprazole  40 mg Intravenous Q12H   polyethylene glycol  17 g Oral BID   Continuous Infusions:  sodium chloride     cefTRIAXone  (ROCEPHIN)  IV     lactated ringers     lactated ringers Stopped (11/28/22 0919)   metroNIDAZOLE 100 mL/hr at 11/28/22 0928   norepinephrine (LEVOPHED) Adult infusion 2 mcg/min (11/28/22 0951)   PRN Meds:.acetaminophen **OR** acetaminophen, fentaNYL (SUBLIMAZE) injection, ondansetron **OR** ondansetron (ZOFRAN) IV, oxyCODONE Allergies  Allergen Reactions   Glimepiride     Other reaction(s): upset stomach   Rosiglitazone     Other reaction(s): loss of taste and sense of smell   Review of Systems  Unable to perform ROS:  Acuity of condition    Physical Exam Vitals and nursing note reviewed.  Constitutional:      General: She is not in acute distress.    Appearance: She is cachectic. She is ill-appearing.  Cardiovascular:     Comments: Transient episodes of tachycardia Bilateral feet cold to touch Pulmonary:     Effort: No tachypnea, accessory muscle usage or respiratory distress.  Abdominal:     General: Abdomen is flat.     Palpations: Abdomen is soft.  Neurological:     Mental Status: She is lethargic.     Comments: Withdraws to pain Does not follow commands Non-verbal     Vital Signs: BP (!) 92/59   Pulse 99   Temp (!) 97.1 F (36.2 C) (Oral)   Resp 13   Ht 5' (1.524 m)   Wt 43.7 kg   SpO2 100%   BMI 18.82 kg/m  Pain Scale: CPOT   Pain Score: 0-No pain   SpO2: SpO2: 100 % O2 Device:SpO2: 100 % O2 Flow Rate: .   IO: Intake/output summary:  Intake/Output Summary (Last 24 hours) at 11/28/2022 1233 Last data filed at 11/28/2022 4540 Gross per 24 hour  Intake 1716.68 ml  Output --  Net 1716.68 ml    LBM: Last BM Date : 11/28/22 Baseline Weight: Weight: 66.6 kg Most recent weight: Weight: 43.7 kg     Palliative Assessment/Data:     Time Total: 75 min  Greater than 50%  of this time was spent counseling and coordinating care related to the above assessment and plan.  Signed by: Yong Channel, NP Palliative Medicine Team Pager # [phone] (M-F  8a-5p) Team Phone # [phone] (Nights/Weekends)

## 2022-11-28 NOTE — TOC Initial Note (Signed)
Transition of Care [facility]) - Initial/Assessment Note    Patient Details  Name: Lindsey Mcguire MRN: [id number] Date of Birth: [birth year]  [facility] CM/SW Contact:    Lockie Pares, RN Phone Number: 11/28/2022, 4:49 PM  Clinical Narrative:                 Chart reviewed, patient presented with failure to thrive to [facility], was on pressors, currently off, transferred to [facility]. Palliative had discussion with family, The patients spouse recently passed away and she had just gotten out of rehabilitation 3-4 days ago after a hospitalization. She is now not very responsive they still want treatments for infection, vasopressors but she is a DNR. TOC will continue to follow for needs recommendations, and transitions of care    Barriers to Discharge: Continued Medical Work up   Patient Goals and CMS Choice            Expected Discharge Plan and Services In-house Referral: Hospice / Palliative Care     Living arrangements for the past 2 months: Single Family Home                                      Prior Living Arrangements/Services Living arrangements for the past 2 months: Single Family Home Lives with:: Adult Children Patient language and need for interpreter reviewed:: Yes        Need for Family Participation in Patient Care: Yes (Comment) Care giver support system in place?: Yes (comment)   Criminal Activity/Legal Involvement Pertinent to Current Situation/Hospitalization: No - Comment as needed  Activities of Daily Living Home Assistive Devices/Equipment: None ADL Screening (condition at time of admission) Patient's cognitive ability adequate to safely complete daily activities?: No Is the patient deaf or have difficulty hearing?: No Does the patient have difficulty seeing, even when wearing glasses/contacts?: Yes Does the patient have difficulty concentrating, remembering, or making decisions?: No Patient able to express need for assistance with  ADLs?: No Does the patient have difficulty dressing or bathing?: Yes Independently performs ADLs?: No Communication: Needs assistance Is this a change from baseline?: Pre-admission baseline Dressing (OT): Needs assistance Is this a change from baseline?: Pre-admission baseline Grooming: Needs assistance Is this a change from baseline?: Pre-admission baseline Feeding: Needs assistance Is this a change from baseline?: Pre-admission baseline Bathing: Needs assistance Is this a change from baseline?: Pre-admission baseline Toileting: Needs assistance Is this a change from baseline?: Pre-admission baseline In/Out Bed: Needs assistance Is this a change from baseline?: Change from baseline, expected to last <3 days Walks in Home: Needs assistance Is this a change from baseline?: Change from baseline, expected to last <3 days Does the patient have difficulty walking or climbing stairs?: Yes Weakness of Legs: Both Weakness of Arms/Hands: Both  Permission Sought/Granted                  Emotional Assessment       Orientation: : Fluctuating Orientation (Suspected and/or reported Sundowners) (Patient with dementia, now not responsive) Alcohol / Substance Use: Not Applicable Psych Involvement: No (comment)  Admission diagnosis:  Hypotension [I95.9] Hypotension, unspecified hypotension type [I95.9] Urinary tract infection without hematuria, site unspecified [N39.0] Stercoral colitis [K52.89] Patient Active Problem List   Diagnosis Date Noted   Stercoral colitis 11/28/2022   Hypotension 11/28/2022   (HFpEF) heart failure with preserved ejection fraction (HCC) 11/28/2022   Hypothyroidism 11/28/2022   Hyperlipidemia  11/28/2022   Fecal impaction (HCC) 11/28/2022   Chronic constipation 11/28/2022   Chronic kidney disease (CKD), stage IV (severe) (HCC) 11/28/2022   Hyperglycemia 11/28/2022   UTI (urinary tract infection) 11/28/2022   Failure to thrive in adult 11/28/2022    Hypertension    Dementia (HCC)    Acute CHF (congestive heart failure) (HCC) 02/07/2019   CHF (congestive heart failure) (HCC) 02/06/2019   ICH (intracerebral hemorrhage) (HCC) 02/23/2017   Intracranial hemorrhage (HCC) 02/23/2017   Type 2 diabetes mellitus with stage 4 chronic kidney disease, without long-term current use of insulin (HCC) 09/24/2016   Anemia due to chronic renal failure treated with erythropoietin 02/16/2011   PCP:  Lorenda Ishihara, MD Pharmacy:   [facility] - [place], [place] - [address] [place] [address] Phone: [phone] Fax: [phone]     Social Determinants of Health (SDOH) Social History: SDOH Screenings   Food Insecurity: Patient Unable To Answer (11/28/2022)  Housing: High Risk (11/28/2022)  Transportation Needs: No Transportation Needs (11/28/2022)  Utilities: Patient Unable To Answer (11/28/2022)  Tobacco Use: Low Risk  (11/27/2022)  Health Literacy: High Risk (10/21/2022)   Received from [facility]   SDOH Interventions:     Readmission Risk Interventions     No data to display

## 2022-11-28 NOTE — Progress Notes (Signed)
11/28/2022 Camera'd in: looks comfortable; TRH will keep at [facility] for now, available by securechat or page [phone] if any issues.  Myrla Halsted MD PCCM

## 2022-11-28 NOTE — ED Notes (Signed)
Titrate Levo to 1 mcg/min

## 2022-11-28 NOTE — IPAL (Signed)
  Interdisciplinary Goals of Care Family Meeting   Date carried out: 11/28/2022  Location of the meeting: Bedside  Member's involved: Nurse Practitioner and Family Member or next of kin  Durable Power of Attorney or acting medical decision maker: Sons Lindsey Mcguire and Lindsey Mcguire     Discussion: We discussed goals of care for Lindsey Mcguire .  Sister Lindsey Mcguire and niece at bedside. Earlier today in talks with palliative care, Sons had asked that updates be given to [name] today while the sons sort their recently deceased father's estate.  (Lindsey Mcguire is not the decision maker)  We talked about clinical updates. I believe [name] is in the dying process. Lindsey Mcguire agrees and feels she has been starting to decline and her body shut down for the past couple of weeks. She shares that hospice was recently discussed for the pt about a week ago, and son Lindsey Mcguire was reportedly in support of this, but no plans had been made.   I recommended that we do not escalate care from here and I agree that hospice is the most appropriate and compassionate course of care for [name]. We talked about what it would look like if we started this process in the hospital vs facility (do not think that she would be home hospice appropriate w diarrhea).   Lindsey Mcguire will call the pts sons to discuss this. If they would like PCCM to call them tonight we certainly can, if they would prefer to talk in the morning that is also okay.  Lindsey Mcguire will let us know their preference.   At present, off pressors. If hypotensive, would favor IVF instead of pressors, but as Lindsey Mcguire is not decision maker as it stands if needed pressors could be restarted.    Code status:   Code Status: DNR   Disposition: Continue current acute care  Time spent for the meeting:     Lanier Clam, NP  11/28/2022, 5:20 PM

## 2022-11-28 NOTE — Consult Note (Signed)
NAME:  LEYLANNI Mcguire, MRN:  [id number], DOB:  October 09, 1934, LOS: 0 ADMISSION DATE:  11/27/2022, CONSULTATION DATE:  11/28/22 REFERRING MD:  [place] - [facility], CHIEF COMPLAINT:  Actively dying    History of Present Illness:  88 yo F PMH dementia severe protein calorie malnutrition FTT in adult HFpEF hypothyroidism DM2 CKD IV Anemia who presented to [facility] ED 7/31 for rectal bleeding. Had been having diarrhea for several days preceding. In ED found to have a severe fecal impaction and stercoral colitis requiring disimpaction. Was hypotensive after, started on IVF abx and NE. Admitted to [facility] but remained hypotensive, prompting PCCM consult and plan for transfer to [place].   She was seen by palliative care 8/1 prior to transfer given her frailty, failure to thrive, etc. A decision was reached for DNR   Transferred to [facility] and to PCCM service in this setting   Pertinent  Medical History  Dementia FTT in adult HFpEF Hypothyroidism CKD IV DM2  HTN HLD Anemia   Significant Hospital Events: Including procedures, antibiotic start and stop dates in addition to other pertinent events   7/31 to ED w diarrhea, large stool impaction disimpacted. Then a little soft on NE 8/1 DNR. transfer to [facility] for NE. Arrives off on NE.   Interim History / Subjective:  Arrives to [facility] off of levo   Objective   Blood pressure (!) 92/59, pulse 99, temperature (!) 97.1 F (36.2 C), temperature source Oral, resp. rate 13, height 5' (1.524 m), weight 43.7 kg, SpO2 100%.        Intake/Output Summary (Last 24 hours) at 11/28/2022 1304 Last data filed at 11/28/2022 7829 Gross per 24 hour  Intake 1716.68 ml  Output --  Net 1716.68 ml   Filed Weights   11/27/22 1809 11/28/22 0923  Weight: 66.6 kg 43.7 kg    Examination: General: cachectic frail elderly F NAD HENT: Periorbital edema. Poor dentition. Tacky mm  Lungs: Symmetrical chest expansion, unlabored  Cardiovascular: tachycardic  Abdomen: thin  Extremities: no acute  appearing joint deformities  Neuro: Moving spontaneously/purposefully, moaning, does not follow commands  GU: defer  Resolved Hospital Problem list     Assessment & Plan:   Acute encephalopathy superimposed on underlying Dementia  -delirium precautions  -correct metabolic abnormalities as able  Hypotension Dehydration -LA is WNL P -cont IVF -off of NE, will dc   AKI on CKD IV -IVF  Severe fecal impaction s/p disimpaction  Stercoral colitis -having Bms following disimpaction  -rocephin flagyl   Hypoglycemia -dc SSI -STAT correction, cont to follow CBGs   FTT in adult  Severe protein calorie malnutrition DNR status  P -cont GOC discussions.  -admitted 09/2022 to [facility] for dehydration where it was noted that she also had a recent fall at home. Sounds like she has had a progressive decline since then, worse in the last 1.5wks since returning home from nursing facility, + her husband died very recently  -In d/w w Palliative Care at [facility], family agrees with DNR/I but wanted all offered medical tx otherwise   Dispo: if remains off of NE, will transfer back to [facility]. Remains in ICU for now, PCCM service   Best Practice (right click and "Reselect all SmartList Selections" daily)   Diet/type: NPO DVT prophylaxis: prophylactic heparin  GI prophylaxis: PPI Lines: N/A Foley:  N/A Code Status:  DNR Last date of multidisciplinary goals of care discussion [8/1 palliative care at [facility]]  Labs   CBC: Recent Labs  Lab 11/27/22 1836  11/27/22 1858  WBC 8.7  --   NEUTROABS 7.1  --   HGB 12.6 15.3*  HCT 39.9 45.0  MCV 86.2  --   PLT 320  --     Basic Metabolic Panel: Recent Labs  Lab 11/27/22 1836 11/27/22 1858  NA 134* 137  K 4.1 4.3  CL 100 103  CO2 23  --   GLUCOSE 243* 232*  BUN 55* 58*  CREATININE 1.83* 1.90*  CALCIUM 9.2  --   MG 2.2  --    GFR: Estimated Creatinine Clearance: 14.7 mL/min (A) (by C-G formula based on SCr of 1.9 mg/dL (H)). Recent Labs  Lab  11/27/22 1836 11/27/22 2001  WBC 8.7  --   LATICACIDVEN 1.1 1.3    Liver Function Tests: Recent Labs  Lab 11/27/22 1836  AST 29  ALT 19  ALKPHOS 107  BILITOT 0.8  PROT 8.5*  ALBUMIN 3.7   Recent Labs  Lab 11/27/22 1836  LIPASE 32   No results for input(s): "AMMONIA" in the last 168 hours.  ABG    Component Value Date/Time   HCO3 24.7 02/05/2019 2345   TCO2 25 11/27/2022 1858   O2SAT 69.5 02/05/2019 2345     Coagulation Profile: Recent Labs  Lab 11/27/22 1836  INR 1.1    Cardiac Enzymes: No results for input(s): "CKTOTAL", "CKMB", "CKMBINDEX", "TROPONINI" in the last 168 hours.  HbA1C: No results found for: "HGBA1C"  CBG: Recent Labs  Lab 11/28/22 0754 11/28/22 1019  GLUCAP 202* 110*    Review of Systems:   Unable to obtain due to encephalopathy   Past Medical History:  She,  has a past medical history of Anemia (10/28/2003), Dementia (HCC), Diabetes mellitus, and Hypertension.   Surgical History:   Past Surgical History:  Procedure Laterality Date   CATARACT EXTRACTION  12/06/2003   left eye   TUBAL LIGATION  1977   YAG LASER APPLICATION Right 11/09/2013   Procedure: YAG LASER APPLICATION;  Surgeon: Loraine Leriche T. Nile Riggs, MD;  Location: [facility];  Service: Ophthalmology;  Laterality: Right;     Social History:   reports that she has never smoked. She has never used smokeless tobacco. She reports that she does not drink alcohol and does not use drugs.   Family History:  Her family history includes Cancer in her brother and brother; Diabetes in her brother, brother, maternal grandmother, and mother; Hypertension in her maternal grandmother and mother.   Allergies Allergies  Allergen Reactions   Glimepiride     Other reaction(s): upset stomach   Rosiglitazone     Other reaction(s): loss of taste and sense of smell     Home Medications  Prior to Admission medications   Medication Sig Start Date End Date Taking? Authorizing Provider  aspirin  81 MG tablet Take 81 mg by mouth daily.     [provider]  atorvastatin (LIPITOR) 40 MG tablet Take 40 mg by mouth daily.    [provider]  calcium carbonate (OS-CAL) 600 MG TABS tablet Take 600 mg by mouth 2 (two) times daily with a meal.    [provider]  cholecalciferol (VITAMIN D3) 25 MCG (1000 UT) tablet Take 1,000 Units by mouth daily.    [provider]  ferrous sulfate 325 (65 FE) MG tablet Take 650 mg by mouth daily with breakfast.     [provider]  levothyroxine (SYNTHROID) 75 MCG tablet Take 75 mcg by mouth every morning. 03/19/19   [provider]  Multiple Vitamin (MULTIVITAMIN) tablet Take 1 tablet by mouth daily.    [provider]  olmesartan (BENICAR) 40 MG tablet Take 40 mg by mouth daily.    [provider]  Polyethyl Glycol-Propyl Glycol (SYSTANE OP) Place 2 drops into both eyes 4 (four) times daily as needed (dryness).     [provider]  Polyethylene Glycol 3350 (MIRALAX PO) Take 17 g by mouth daily as needed (constipation).     [provider]  potassium chloride 20 MEQ TBCR Take 20 mEq by mouth daily. 02/11/19 03/30/19  Maurilio Lovely D, DO     Critical care time: 40 min    CRITICAL CARE Performed by: Lanier Clam   Total critical care time: 40 minutes  Critical care time was exclusive of separately billable procedures and treating other patients. Critical care was necessary to treat or prevent imminent or life-threatening deterioration.  Critical care was time spent personally by me on the following activities: development of treatment plan with patient and/or surrogate as well as nursing, discussions with consultants, evaluation of patient's response to treatment, examination of patient, obtaining history from patient or surrogate, ordering and performing treatments and interventions, ordering and review of laboratory studies, ordering and review of radiographic  studies, pulse oximetry and re-evaluation of patient's condition.  Tessie Fass MSN, AGACNP-BC [place] Pulmonary/Critical Care Medicine Amion for pager  11/28/2022, 3:19 PM

## 2022-11-28 NOTE — Progress Notes (Signed)
Report called to [facility], RN for patient transport to [facility]. Pt on levo @1 / LR @75 . VSS. [facility] here at this time for patient transport.

## 2022-11-28 NOTE — Progress Notes (Signed)
[facility]  Called by ED for discussion of disimpaction and tips. Ed as able to disimpact and do enema. Dr. Laural Benes does not feel like she needs surgical consult at this time as she has stercoral colitis and now is having Bms. He will treat her medically.   If any changes, he will notify us.   Algis Greenhouse, MD [facility] [address] [place], [place] [address] [phone] (office)

## 2022-11-28 NOTE — H&P (Signed)
History and Physical  [facility]  Lindsey Mcguire YNW:[id number] DOB: 29-Dec-1934 DOA: 11/27/2022  PCP: Lorenda Ishihara, MD  Patient coming from: Home  Level of care: ICU  I have personally briefly reviewed patient's old medical records in [facility]  Chief Complaint: rectal bleeding  HPI: Lindsey Mcguire is a 88 year old female with hypertension, hyperlipidemia, hypothyroidism, dementia, HFpEF, type 2 diabetes mellitus, anemia, stage IV CKD, osteoporosis who had recently been discharged on 10/28/2022 from [facility] after being treated for dehydration, deconditioning, failure to thrive, weakness, sepsis from cystitis and was subsequently discharged to SNF and remained in therapy for 2 weeks subsequently discharged home.  Her husband recently passed away.  She lives with her son and grandson.  Family reported that she has been having diarrhea for the last several days and noticed some blood in her stool.  They brought her to the emergency department for further evaluation.  She was noted to be extremely debilitated cachectic and had severe fecal impaction with a large stool ball which required disimpaction in the ED and stercoral colitis noted.  She was also noted to be hypotensive and dehydrated and eventually required low-dose norepinephrine infusion to maintain blood pressure.  She was started on antibiotic therapy and admission requested for further management.   Past Medical History:  Diagnosis Date   Anemia 10/28/2003   Dementia (HCC)    Diabetes mellitus    Hypertension     Past Surgical History:  Procedure Laterality Date   CATARACT EXTRACTION  12/06/2003   left eye   TUBAL LIGATION  1977   YAG LASER APPLICATION Right 11/09/2013   Procedure: YAG LASER APPLICATION;  Surgeon: Loraine Leriche T. Nile Riggs, MD;  Location: [facility];  Service: Ophthalmology;  Laterality: Right;     reports that she has never smoked. She has never used smokeless tobacco. She reports that she  does not drink alcohol and does not use drugs.  Allergies  Allergen Reactions   Glimepiride     Other reaction(s): upset stomach   Rosiglitazone     Other reaction(s): loss of taste and sense of smell    Family History  Problem Relation Age of Onset   Diabetes Mother    Hypertension Mother    Cancer Brother    Diabetes Brother    Hypertension Maternal Grandmother    Diabetes Maternal Grandmother    Cancer Brother    Diabetes Brother     Prior to Admission medications   Medication Sig Start Date End Date Taking? Authorizing Provider  aspirin 81 MG tablet Take 81 mg by mouth daily.     [provider]  atorvastatin (LIPITOR) 40 MG tablet Take 40 mg by mouth daily.    [provider]  calcium carbonate (OS-CAL) 600 MG TABS tablet Take 600 mg by mouth 2 (two) times daily with a meal.    [provider]  cholecalciferol (VITAMIN D3) 25 MCG (1000 UT) tablet Take 1,000 Units by mouth daily.    [provider]  ferrous sulfate 325 (65 FE) MG tablet Take 650 mg by mouth daily with breakfast.     [provider]  levothyroxine (SYNTHROID) 75 MCG tablet Take 75 mcg by mouth every morning. 03/19/19   [provider]  Multiple Vitamin (MULTIVITAMIN) tablet Take 1 tablet by mouth daily.    [provider]  olmesartan (BENICAR) 40 MG tablet Take 40 mg by mouth daily.    [provider]  Polyethyl Glycol-Propyl Glycol (SYSTANE  OP) Place 2 drops into both eyes 4 (four) times daily as needed (dryness).     [provider]  Polyethylene Glycol 3350 (MIRALAX PO) Take 17 g by mouth daily as needed (constipation).     [provider]  potassium chloride 20 MEQ TBCR Take 20 mEq by mouth daily. 02/11/19 03/30/19  Maurilio Lovely D, DO    Physical Exam: Vitals:   11/28/22 0937 11/28/22 0945 11/28/22 0952 11/28/22 1000  BP: (!) 79/48  (!) 140/74 (!) 72/45  Pulse:      Resp: 17 14 13 12   Temp:      TempSrc:       SpO2:      Weight:      Height:       Constitutional: frail, cachectic female, curled in fetal position, nonverbal, NAD, calm, comfortable Eyes: PERRL, lids and conjunctivae normal ENMT: Mucous membranes are dry. Posterior pharynx clear of any exudate or lesions.   Neck: normal, supple, no masses, no thyromegaly Respiratory: clear to auscultation bilaterally, no wheezing, no crackles. Normal respiratory effort. No accessory muscle use.  Cardiovascular: normal s1, s2 sounds.   Abdomen: flat; no tenderness, no masses palpated. No hepatosplenomegaly. Bowel sounds positive.  Musculoskeletal: age-related changes, no clubbing / cyanosis.   Skin: diffuse age related changes; no rashes, lesions, ulcers. No induration Neurologic: uncooperative for exam, no gross deficits found.   Psychiatric: Poor judgment and insight. Somnolent and nonverbal.   Labs on Admission: I have personally reviewed following labs and imaging studies  CBC: Recent Labs  Lab 11/27/22 1836 11/27/22 1858  WBC 8.7  --   NEUTROABS 7.1  --   HGB 12.6 15.3*  HCT 39.9 45.0  MCV 86.2  --   PLT 320  --    Basic Metabolic Panel: Recent Labs  Lab 11/27/22 1836 11/27/22 1858  NA 134* 137  K 4.1 4.3  CL 100 103  CO2 23  --   GLUCOSE 243* 232*  BUN 55* 58*  CREATININE 1.83* 1.90*  CALCIUM 9.2  --   MG 2.2  --    GFR: Estimated Creatinine Clearance: 14.7 mL/min (A) (by C-G formula based on SCr of 1.9 mg/dL (H)). Liver Function Tests: Recent Labs  Lab 11/27/22 1836  AST 29  ALT 19  ALKPHOS 107  BILITOT 0.8  PROT 8.5*  ALBUMIN 3.7   Recent Labs  Lab 11/27/22 1836  LIPASE 32   No results for input(s): "AMMONIA" in the last 168 hours. Coagulation Profile: Recent Labs  Lab 11/27/22 1836  INR 1.1   Cardiac Enzymes: No results for input(s): "CKTOTAL", "CKMB", "CKMBINDEX", "TROPONINI" in the last 168 hours. BNP (last 3 results) No results for input(s): "PROBNP" in the last 8760 hours. HbA1C: No  results for input(s): "HGBA1C" in the last 72 hours. CBG: Recent Labs  Lab 11/28/22 0754  GLUCAP 202*   Lipid Profile: No results for input(s): "CHOL", "HDL", "LDLCALC", "TRIG", "CHOLHDL", "LDLDIRECT" in the last 72 hours. Thyroid Function Tests: No results for input(s): "TSH", "T4TOTAL", "FREET4", "T3FREE", "THYROIDAB" in the last 72 hours. Anemia Panel: No results for input(s): "VITAMINB12", "FOLATE", "FERRITIN", "TIBC", "IRON", "RETICCTPCT" in the last 72 hours. Urine analysis:    Component Value Date/Time   COLORURINE YELLOW 11/27/2022 2222   APPEARANCEUR CLOUDY (A) 11/27/2022 2222   LABSPEC 1.014 11/27/2022 2222   PHURINE 6.0 11/27/2022 2222   GLUCOSEU NEGATIVE 11/27/2022 2222   HGBUR MODERATE (A) 11/27/2022 2222   BILIRUBINUR NEGATIVE 11/27/2022 2222   KETONESUR NEGATIVE  11/27/2022 2222   PROTEINUR 30 (A) 11/27/2022 2222   UROBILINOGEN 0.2 05/15/2010 1725   NITRITE POSITIVE (A) 11/27/2022 2222   LEUKOCYTESUR LARGE (A) 11/27/2022 2222    Radiological Exams on Admission: CT ABDOMEN PELVIS WO CONTRAST  Result Date: 11/27/2022 CLINICAL DATA:  Acute abdominal pain with diarrhea. EXAM: CT ABDOMEN AND PELVIS WITHOUT CONTRAST TECHNIQUE: Multidetector CT imaging of the abdomen and pelvis was performed following the standard protocol without IV contrast. RADIATION DOSE REDUCTION: This exam was performed according to the departmental dose-optimization program which includes automated exposure control, adjustment of the mA and/or kV according to patient size and/or use of iterative reconstruction technique. COMPARISON:  CT abdomen and pelvis 05/15/2010 FINDINGS: Lower chest: There is atelectasis in the left lung base. Hepatobiliary: No focal liver abnormality is seen. No gallstones, gallbladder wall thickening, or biliary dilatation. Pancreas: Atrophic.  No acute findings. Spleen: Normal in size without focal abnormality. Adrenals/Urinary Tract: There is a rounded 6 cm cyst in the right  kidney. There is an inferior pole cyst in the left kidney measuring 12 mm. There is no hydronephrosis or urinary tract calculi. The bilateral adrenal glands and bladder are within normal limits. Stomach/Bowel: The rectum is dilated and stool-filled measuring up to 7.5 cm. There is diffuse rectal wall thickening with presacral edema and mild surrounding inflammation. There is no evidence for bowel obstruction, pneumatosis or free air. There is a moderate amount of stool in the remaining colon. The appendix is not definitely seen. Small bowel and stomach are within normal limits. Vascular/Lymphatic: Aortic atherosclerosis. No enlarged abdominal or pelvic lymph nodes. Reproductive: Uterus and bilateral adnexa are unremarkable. There are calcifications in the left adnexa. Other: There is moderate elevation of the left hemidiaphragm, unchanged. There is no ascites. There is a small umbilical hernia containing nondilated bowel. Musculoskeletal: Degenerative changes affect the spine. There is chronic compression deformity of T10 which is new from 2012. There is 6 mm of anterolisthesis at L5-S1, new from 2012 and favored as degenerative. The bones are diffusely osteopenic. IMPRESSION: 1. The rectum is dilated and stool-filled with wall thickening and surrounding inflammatory stranding. Findings are concerning for Stercoral colitis/proctitis with fecal impaction. 2. Small umbilical hernia containing nondilated bowel. 3. Right Bosniak I benign renal cyst measuring 6 cm. No follow-up imaging is recommended. JACR 2018 Feb; 264-273, Management of the Incidental Renal Mass on CT, [employer] 2021; 814-848, Bosniak Classification of Cystic Renal Masses, Version 2019. Aortic Atherosclerosis (ICD10-I70.0). Electronically Signed   By: Darliss Cheney M.D.   On: 11/27/2022 21:38    Assessment/Plan Principal Problem:   Stercoral colitis Active Problems:   Type 2 diabetes mellitus with stage 4 chronic kidney disease, without  long-term current use of insulin (HCC)   Hypotension   (HFpEF) heart failure with preserved ejection fraction (HCC)   Hypertension   Dementia (HCC)   Hypothyroidism   Hyperlipidemia   Fecal impaction (HCC)   Chronic constipation   Chronic kidney disease (CKD), stage IV (severe) (HCC)   Hyperglycemia   UTI (urinary tract infection)  Acute Hypotension  - presumably secondary to severe dehydration - manage with IV fluid hydration and try to wean off norepinephrine infusion - no s/s of sepsis at this time - treating stercoral colitis  Fecal Impaction / chronic constipation  - pt was manually disimpacted in ED - miralax 17 gm BID ordered   UTI - continue ceftriaxone IV as ordered - follow up urine culture and sensitivity  Type 2 DM with hyperglycemia  -  continue SSI coverage and CBG monitoring - prandial coverage for meals eaten >50%   Hypothyroidism - resume home daily levothyroxine after home meds reconciled  Stercoral colitis - manually disimpacted in ED  - continue ceftriaxone/metronidazole IV for now  Severe protein calorie malnutrition Adult Failure to thrive  - Pt has progressively declined since recent admission last month - acute short term rehab was tried and no significant improvement - I have requested a goals of care discussion with palliative care team due to high mortality risk  Alzheimer's Dementia - delirium precautions and supportive care ordered - family says that she had been able to ambulate prior to recent illnesses but was not very verbal  Stage IV CKD - renally dose medication as needed   DVT prophylaxis: [place] heparin  Code Status: full   Family Communication: bedside update 8/1  Disposition Plan: TBD   Consults called: palliative care   Admission status: IP   Level of care: ICU Standley Dakins MD [facility] How to contact the [facility] Attending or Consulting provider 7A - 7P or covering provider during after hours 7P -7A, for this  patient?  Check the care team in [facility] and look for a) attending/consulting [facility] provider listed and b) the [facility] team listed Log into [email or web address] and use [place]'s universal password to access. If you do not have the password, please contact the hospital operator. Locate the [facility] provider you are looking for under [facility] and page to a number that you can be directly reached. If you still have difficulty reaching the provider, please page the [facility] (Director on Call) for the Hospitalists listed on amion for assistance.   If 7PM-7AM, please contact night-coverage [email or web address] Password [email or web address]  11/28/2022, 10:07 AM

## 2022-11-28 NOTE — ED Notes (Signed)
EDP notified of pts BP. Pt to go back on Levo. RN to try to [place] second IV Flagyl paused until 2nd IV established

## 2022-11-29 DIAGNOSIS — K5289 Other specified noninfective gastroenteritis and colitis: Secondary | ICD-10-CM | POA: Diagnosis not present

## 2022-11-29 DIAGNOSIS — Z7189 Other specified counseling: Secondary | ICD-10-CM

## 2022-11-29 DIAGNOSIS — N390 Urinary tract infection, site not specified: Secondary | ICD-10-CM

## 2022-11-29 DIAGNOSIS — Z515 Encounter for palliative care: Secondary | ICD-10-CM

## 2022-11-29 LAB — GLUCOSE, CAPILLARY
Glucose-Capillary: 129 mg/dL — ABNORMAL HIGH (ref 70–99)
Glucose-Capillary: 117 mg/dL — ABNORMAL HIGH (ref 70–99)
Glucose-Capillary: 115 mg/dL — ABNORMAL HIGH (ref 70–99)
Glucose-Capillary: 135 mg/dL — ABNORMAL HIGH (ref 70–99)
Glucose-Capillary: 148 mg/dL — ABNORMAL HIGH (ref 70–99)
Glucose-Capillary: 138 mg/dL — ABNORMAL HIGH (ref 70–99)

## 2022-11-29 MED ORDER — ATORVASTATIN CALCIUM 40 MG PO TABS
40.0000 mg | ORAL_TABLET | Freq: Every day | ORAL | Status: DC
  Administered 2022-11-29 – 2022-12-18 (×19): 40 mg via ORAL
  Filled 2022-11-29 (×21): qty 1

## 2022-11-29 MED ORDER — LEVOTHYROXINE SODIUM 88 MCG PO TABS
88.0000 ug | ORAL_TABLET | Freq: Every day | ORAL | Status: DC
  Administered 2022-11-30 – 2022-12-18 (×19): 88 ug via ORAL
  Filled 2022-11-29 (×20): qty 1

## 2022-11-29 MED ORDER — SODIUM CHLORIDE 0.9 % IV SOLN
1.0000 g | INTRAVENOUS | Status: DC
  Administered 2022-11-29: 1 g via INTRAVENOUS
  Filled 2022-11-29: qty 10

## 2022-11-29 NOTE — Plan of Care (Signed)

## 2022-11-29 NOTE — Progress Notes (Signed)
   NAME:  Lindsey Mcguire, MRN:  [id number], DOB:  07-Jan-1935, LOS: 1 ADMISSION DATE:  11/27/2022  History of Present Illness:  88 year old with frailty and dementia presents with sepsis and fecal impaction, found to have stercoral colitis and UTI, doing better now.  Significant Hospital Events:  8/1 admitted for sepsis with shock  Interim History / Subjective:  Doing much better, some rectal pain, not much appetite, but otherwise comfortable.  Objective   Blood pressure 138/86, pulse 78, temperature (!) 97.5 F (36.4 C), temperature source Oral, resp. rate 14, height 5' (1.524 m), weight 43.7 kg, SpO2 100%.        Intake/Output Summary (Last 24 hours) at 11/29/2022 0836 Last data filed at 11/29/2022 0600 Gross per 24 hour  Intake 3409.31 ml  Output 600 ml  Net 2809.31 ml   Filed Weights   11/27/22 1809 11/28/22 0923  Weight: 66.6 kg 43.7 kg    Examination: Frail lady in no distress Normal heart rate and rhythm Breathing normally on room air Abdomen non-tender Skin warm and dry Alert, interactive, follows instructions  Resolved Hospital Problem list   Resolved Problems:   * No resolved hospital problems. *   Assessment & Plan:  Principal Problem:   Stercoral colitis Active Problems:   Type 2 diabetes mellitus with stage 4 chronic kidney disease, without long-term current use of insulin (HCC)   Hypotension   (HFpEF) heart failure with preserved ejection fraction (HCC)   Hypertension   Dementia (HCC)   Hypothyroidism   Hyperlipidemia   Fecal impaction (HCC)   Chronic constipation   Chronic kidney disease (CKD), stage IV (severe) (HCC)   Hyperglycemia   UTI (urinary tract infection)   Failure to thrive in adult  Fecal impaction Stercoral colitis - Much improved today after disimpaction - Continue metronidazole through 8/3 - DC oral iron  UTI - Lots of WBCs, nitrite + - Ceftriaxone through 8/3  Septic shock, resolved - No pressors since  admission  Frailty - GOC discussions ongoing - Ensures  CKD IV - Stable creatinine and electrolytes  Diabetes - Insulin held for hypoglycemia on admission - CBG at goal currently  Stable for transfer to floor  Best practice (daily eval):  Diet/type: Regular consistency (see orders) DVT prophylaxis: prophylactic heparin  GI prophylaxis: N/A Lines: N/A Foley:  N/A Code Status:  DNR  Marrianne Mood MD 11/29/2022, 8:36 AM  Pager: [phone]

## 2022-11-29 NOTE — Progress Notes (Signed)
Daily Progress Note   Patient Name: Lindsey Mcguire       Date: 11/29/2022 DOB: 11-08-1934  Age: 88 y.o. MRN#: [id number] Attending Physician: Leslye Peer, MD Primary Care Physician: Lorenda Ishihara, MD Admit Date: 11/27/2022  Reason for Consultation/Follow-up: Establishing goals of care  Length of Stay: 1  Current Medications: Scheduled Meds:   Chlorhexidine Gluconate Cloth  6 each Topical Daily   feeding supplement  237 mL Oral BID BM   Gerhardt's butt cream   Topical BID   heparin injection (subcutaneous)  5,000 Units Subcutaneous Q8H   mouth rinse  15 mL Mouth Rinse 4 times per day   polyethylene glycol  17 g Oral BID    Continuous Infusions:  sodium chloride     cefTRIAXone (ROCEPHIN)  IV     lactated ringers 100 mL/hr at 11/29/22 0833   metroNIDAZOLE 500 mg (11/29/22 1008)    PRN Meds: acetaminophen **OR** acetaminophen, fentaNYL (SUBLIMAZE) injection, ondansetron **OR** ondansetron (ZOFRAN) IV, mouth rinse, oxyCODONE  Physical Exam Vitals reviewed.  Constitutional:      Appearance: She is ill-appearing.  HENT:     Head: Normocephalic and atraumatic.  Pulmonary:     Effort: Pulmonary effort is normal.  Skin:    General: Skin is warm and dry.  Neurological:     Mental Status: She is disoriented.             Vital Signs: BP 133/76   Pulse 72   Temp (!) 97.5 F (36.4 C) (Oral)   Resp 10   Ht 5' (1.524 m)   Wt 43.7 kg   SpO2 100%   BMI 18.82 kg/m  SpO2: SpO2: 100 % O2 Device: O2 Device: Room Air O2 Flow Rate:    Intake/output summary:  Intake/Output Summary (Last 24 hours) at 11/29/2022 1059 Last data filed at 11/29/2022 0800 Gross per 24 hour  Intake 3593.58 ml  Output 600 ml  Net 2993.58 ml   LBM: Last BM Date : 11/29/22 Baseline Weight:  Weight: 66.6 kg Most recent weight: Weight: 43.7 kg       Palliative Assessment/Data: 30%     Palliative Care Assessment & Plan   Patient Profile: 88 y.o. female  with past medical history of dementia, HTN, HLD, HFpEF, hypothyroidism, diabetes, anemia, CKD stage IV, osteoporosis, recent hospitalization  for UTI/sepsis 6/22-10/28/22 followed by rehab stay admitted on 11/27/2022 with diarrhea and blood in stool with stercoral colitis with disimpaction in ED. Being treated for UTI. No longer requiring vasopressor support.   Today's Discussion: Spoke with son Lindsey Mcguire by phone and son [name] at the patient's bedside. Before the patient's son [name] saw the patient this morning they were considering hospice care. They want to ensure their mother is comfortable and "does not suffer." Once Lindsey Mcguire saw the patient's improvement from yesterday (she is off pressors, awake, and responding) they would like time for outcomes. While we were talking the provider said she may move out of the ICU today. They are planning their father's funeral service and this is a lot for them all to take in.  The patient has been declining over the last month or so. She has been eating a few bites at each meal and has not been getting out of bed independently. She also has dementia. I shared that I was still very worried about their mother and we discussed her overall health and frailty. We discussed the importance of continued conversation with family and the medical providers regarding overall plan of care and treatment options, ensuring decisions are within the context of the patient's values and GOCs. Both son's say they never discussed advanced care planning with their mother. I gave Lindsey Mcguire the Hard Choices booklet to review. Encouraged them to reach out to PMT with questions or concerns.  We plan to meet (sons [name] and Lindsey Mcguire and sister Lindsey Mcguire) over the weekend to establish goals of care moving forward.   Recommendations/Plan: DNR Continue to  treat the treatable Allow time for outcomes Plan for family meeting to discuss goals of care this weekend (son will let PMT know time) Ongoing PMT support   Code Status:    Code Status Orders  (From admission, onward)           Start     Ordered   11/28/22 1310  Do not attempt resuscitation (DNR)  Continuous       Question Answer Comment  If patient has no pulse and is not breathing Do Not Attempt Resuscitation   If patient has a pulse and/or is breathing: Medical Treatment Goals LIMITED ADDITIONAL INTERVENTIONS: Use medication/IV fluids and cardiac monitoring as indicated; Do not use intubation or mechanical ventilation (DNI), also provide comfort medications.  Transfer to Progressive/Stepdown as indicated, avoid Intensive Care.   Consent: Discussion documented in EHR or advanced directives reviewed      11/28/22 1310           Prognosis:  Unable to determine  Discharge Planning: To Be Determined  Care plan was discussed with bedside RN and Dr. Delton Coombes  Time spent: 50 minutes  Detailed review of medical records (labs, imaging, vital signs), medically appropriate exam, discussed with treatment team, counseling and education to family, documenting clinical information, and coordination of care   Thank you for allowing the Palliative Medicine Team to assist in the care of this patient.   Sherryll Burger, NP  Please contact Palliative Medicine Team phone at [phone] for questions and concerns.

## 2022-11-30 DIAGNOSIS — Z7189 Other specified counseling: Secondary | ICD-10-CM | POA: Diagnosis not present

## 2022-11-30 DIAGNOSIS — Z515 Encounter for palliative care: Secondary | ICD-10-CM | POA: Diagnosis not present

## 2022-11-30 DIAGNOSIS — K5289 Other specified noninfective gastroenteritis and colitis: Secondary | ICD-10-CM | POA: Diagnosis not present

## 2022-11-30 LAB — GLUCOSE, CAPILLARY
Glucose-Capillary: 171 mg/dL — ABNORMAL HIGH (ref 70–99)
Glucose-Capillary: 134 mg/dL — ABNORMAL HIGH (ref 70–99)
Glucose-Capillary: 94 mg/dL (ref 70–99)

## 2022-11-30 MED ORDER — CEFADROXIL 500 MG PO CAPS
500.0000 mg | ORAL_CAPSULE | Freq: Every day | ORAL | Status: AC
  Administered 2022-11-30 – 2022-12-03 (×4): 500 mg via ORAL
  Filled 2022-11-30 (×4): qty 1

## 2022-11-30 MED ORDER — K PHOS MONO-SOD PHOS DI & MONO 155-852-130 MG PO TABS
500.0000 mg | ORAL_TABLET | Freq: Four times a day (QID) | ORAL | Status: AC
  Administered 2022-11-30 (×4): 500 mg via ORAL
  Filled 2022-11-30 (×4): qty 2

## 2022-11-30 NOTE — Progress Notes (Signed)
PT Cancellation Note  Patient Details Name: DEMAYA HARDGE MRN: [id number] DOB: 1935/02/09   Cancelled Treatment:    Reason Eval/Treat Not Completed: Other (comment)  Patient sleeping soundly. Will try to see her when more alert and able to participate.   Jerolyn Center, PT Acute Rehabilitation Services  Office [phone]  Zena Amos 11/30/2022, 9:12 AM

## 2022-11-30 NOTE — Progress Notes (Signed)
PT Cancellation Note  Patient Details Name: Lindsey Mcguire MRN: [id number] DOB: May 03, 1934   Cancelled Treatment:    Reason Eval/Treat Not Completed: Patient's level of consciousness  Patient again sleeping. Attempted to awaken pt via multiple methods. When lifting her arm to put on BP cuff and take BP, she pulled her LUE close against her body (resisting). When tapping her shoulder, she shrugs shoulder away. At no point did she open her eyes or make any effort to speak. RN informed of RUE edema (which she was already aware). Will attempt eval 12/01/22.   Jerolyn Center, PT Acute Rehabilitation Services  Office [phone]  Zena Amos 11/30/2022, 1:35 PM

## 2022-11-30 NOTE — Progress Notes (Signed)
Daily Progress Note   Patient Name: Lindsey Mcguire       Date: 11/30/2022 DOB: 08-07-1934  Age: 88 y.o. MRN#: [id number] Attending Physician: Zigmund Daniel., * Primary Care Physician: Lorenda Ishihara, MD Admit Date: 11/27/2022  Reason for Consultation/Follow-up: Establishing goals of care  Length of Stay: 2  Current Medications: Scheduled Meds:   atorvastatin  40 mg Oral Daily   cefadroxil  500 mg Oral QHS   Chlorhexidine Gluconate Cloth  6 each Topical Daily   feeding supplement  237 mL Oral BID BM   Gerhardt's butt cream   Topical BID   heparin injection (subcutaneous)  5,000 Units Subcutaneous Q8H   levothyroxine  88 mcg Oral Daily   mouth rinse  15 mL Mouth Rinse 4 times per day   phosphorus  500 mg Oral QID   polyethylene glycol  17 g Oral BID    Continuous Infusions:  sodium chloride     lactated ringers 100 mL/hr at 11/30/22 0400   metroNIDAZOLE 500 mg (11/30/22 0845)    PRN Meds: acetaminophen **OR** acetaminophen, fentaNYL (SUBLIMAZE) injection, ondansetron **OR** ondansetron (ZOFRAN) IV, mouth rinse, oxyCODONE  Physical Exam Vitals reviewed.  Constitutional:      General: She is sleeping.     Appearance: She is ill-appearing.  HENT:     Head: Normocephalic and atraumatic.  Pulmonary:     Effort: Pulmonary effort is normal.  Neurological:     Mental Status: She is easily aroused. She is disoriented.             Vital Signs: BP 129/81 (BP Location: Left Arm)   Pulse 77   Temp 98.1 F (36.7 C) (Oral)   Resp 18   Ht 5' (1.524 m)   Wt 43.7 kg   SpO2 98%   BMI 18.82 kg/m  SpO2: SpO2: 98 % O2 Device: O2 Device: Room Air O2 Flow Rate:    Intake/output summary:  Intake/Output Summary (Last 24 hours) at 11/30/2022 1110 Last data filed at  11/30/2022 0846 Gross per 24 hour  Intake 1835.57 ml  Output 100 ml  Net 1735.57 ml   LBM: Last BM Date : 11/30/22 Baseline Weight: Weight: 66.6 kg Most recent weight: Weight: 43.7 kg  Palliative Care Assessment & Plan   Patient Profile: 88 y.o. female  with  past medical history of dementia, HTN, HLD, HFpEF, hypothyroidism, diabetes, anemia, CKD stage IV, osteoporosis, recent hospitalization for UTI/sepsis 6/22-10/28/22 followed by rehab stay admitted on 11/27/2022 with diarrhea and blood in stool with stercoral colitis with disimpaction in ED. Being treated for UTI. No longer requiring vasopressor support. No longer in the ICU.  Assessment: Extensive chart review has been completed prior to meeting with patient/family including labs, vital signs, imaging, progress/consult notes, orders, medications, and available advance directive documents.   Patient was asleep but easily awakened. She seems more agitated and confused than yesterday. Told her I would call her sons to talk.  11:35 Called patient's son Kathlene November. Gave him an update on patient this morning. The family does not have any questions or concerns at this time but would like to meet with PMT tomorrow afternoon (they do not have a set time yet) to discuss goals of care. We discussed the importance of continued conversation with family and the medical providers regarding overall plan of care and treatment options, ensuring decisions are within the context of the patient's values and GOCs.   Recommendations/Plan: DNR Continue to treat the treatable Allow time for outcomes Plan for family meeting to discuss goals of care on Sunday afternoon Ongoing PMT support   Code Status:    Code Status Orders  (From admission, onward)           Start     Ordered   11/28/22 1310  Do not attempt resuscitation (DNR)  Continuous       Question Answer Comment  If patient has no pulse and is not breathing Do Not Attempt Resuscitation   If patient  has a pulse and/or is breathing: Medical Treatment Goals LIMITED ADDITIONAL INTERVENTIONS: Use medication/IV fluids and cardiac monitoring as indicated; Do not use intubation or mechanical ventilation (DNI), also provide comfort medications.  Transfer to Progressive/Stepdown as indicated, avoid Intensive Care.   Consent: Discussion documented in EHR or advanced directives reviewed      08 /01/24 1310         Care plan was discussed with Dr. Lowell Guitar  Time spent: 25 minutes  Thank you for allowing the Palliative Medicine Team to assist in the care of this patient.   Sherryll Burger, NP  Please contact Palliative Medicine Team phone at [phone] for questions and concerns.

## 2022-11-30 NOTE — Plan of Care (Signed)

## 2022-11-30 NOTE — Progress Notes (Signed)
**Note De-Identified vi Obfusction** PROGRESS NOTE    Lindsey Mcguire  QVZ:[id number] DOB: [birth year] DOA: 11/27/2022 PCP: Lorend Ishihr, MD  Chief Complint  Ptient presents with   Dirrhe    Brief Nrrtive:    Lindsey Mcguire is  76 yer old femle with hypertension, hyperlipidemi, hypothyroidism, dementi, HFpEF, type 2 dibetes mellitus, nemi, stge IV CKD, osteoporosis who hd recently been dischrged on 10/28/2022 from [facility] fter being treted for dehydrtion, deconditioning, filure to thrive, wekness, sepsis from cystitis nd ws subsequently dischrged to SNF nd remined in therpy for 2 weeks subsequently dischrged home.  Her husbnd recently pssed wy.  She lives with her son nd grndson.  Fmily reported tht she hs been hving dirrhe for the lst severl dys nd noticed some blood in her stool.  They brought her to the emergency deprtment for further evlution.  She ws noted to be extremely debilitted cchectic nd hd severe fecl impction with  lrge stool bll which required disimpction in the ED nd stercorl colitis noted.  She ws lso noted to be hypotensive nd dehydrted nd eventully required low-dose norepinephrine infusion to mintin blood pressure.  She ws strted on ntibiotic therpy nd dmission requested for further mngement.   [facility]  Admitted for sepsis in setting of sterocrl colitis nd UTI.    Assessment & Pln:   Principl Problem:   Stercorl colitis Active Problems:   Filure to thrive in dult   Type 2 dibetes mellitus with stge 4 chronic kidney disese, without long-term current use of insulin (HCC)   Hypotension   (HFpEF) hert filure with preserved ejection frction (HCC)   Hypertension   Dementi (HCC)   Hypothyroidism   Hyperlipidemi   Fecl impction (HCC)   Chronic constiption   Chronic kidney disese (CKD), stge IV (severe) (HCC)   Hyperglycemi   UTI (urinry trct  infection)  Sepsis due to UTI nd Stercorl Colitis CT with dilted nd stool filled rectum with wll thickening nd surrounding inflmmtory strnding (concerning for stercorl colitis/proctitis with fecl impction)  S/p disimpction by pccm -> bowel regimen with BID mirlx Will continue bx with nerobic coverge through tody Will extend bx coverge for UTI to 7 dys given presenttion with sepsis Unfortuntely no urine or blood culture dt   Acute Metbolic Encephlopthy  Hx Dementi  Delirium precutions  AKI on CKD IIIB improving  Hypomgnesemi Hypophosphtemi Replce nd follow   Filure to thrive Severe protein clorie mlnutrition Apprecite pllitive cre ssistnce with gols of cre Body mss index is 18.82 kg/m.  Hypothyroidism Synthroid  Anemi Hold iron with constiption Mild nd stble    DVT prophylxis: heprin Code Sttus: dnr Fmily Communiction: none Disposition:   Sttus is: Inptient Remins inptient pproprite becuse: need for further inptient cre   Consultnts:  PCCM  Procedures:  none  Antimicrobils:  Anti-infectives (From dmission, onwrd)    Strt     Dose/Rte Route Frequency Ordered Stop   11/29/22 2200  cefTRIAXone (ROCEPHIN) 1 g in sodium chloride 0.9 % 100 mL IVPB        1 g 200 mL/hr over 30 Minutes Intrvenous Every 24 hours 11/29/22 1034 11/30/22 2359   11/28/22 2200  cefTRIAXone (ROCEPHIN) 1 g in sodium chloride 0.9 % 100 mL IVPB  Sttus:  Discontinued        1 g 200 mL/hr over 30 Minutes Intrvenous Every 24 hours 11/28/22 0731 11/28/22 0931   11/28/22 2200  cefTRIAXone (ROCEPHIN) 2 g in sodium chloride 0.9 % **Note De-Identified vi Obfusction** 100 mL IVPB  Sttus:  Discontinued        2 g 200 mL/hr over 30 Minutes Intrvenous Every 24 hours 11/28/22 0931 11/29/22 1034   11/28/22 1000  metroNIDZOLE (FLGYL) IVPB 500 mg        500 mg 100 mL/hr over 60 Minutes Intrvenous Every 12 hours 11/28/22 0731 11/30/22 2359   11/27/22 2300   cefTRIXone (ROCEPHIN) 1 g in sodium chloride 0.9 % 100 mL IVPB        1 g 200 mL/hr over 30 Minutes Intrvenous  Once 11/27/22 2251 11/28/22 0240   11/27/22 2300  metroNIDZOLE (FLGYL) IVPB 500 mg        500 mg 100 mL/hr over 60 Minutes Intrvenous  Once 11/27/22 2251 11/28/22 0157       Subjective: No complints Plesntly confused  Objective: Vitls:   11/30/22 0331 11/30/22 0414 11/30/22 0535 11/30/22 0600  BP:  (!) 156/71 (!) 183/84 (!) 138/96  Pulse:  88 94 (!) 108  Resp:  13    Temp: 97.6 F (36.4 C)  98 F (36.7 C)   TempSrc: xillry  Orl   SpO2:  100% (!) 88% 97%  Weight:      Height:        Intke/Output Summry (Lst 24 hours) t 11/30/2022 8469 Lst dt filed t 11/30/2022 0400 Gross per 24 hour  Intke 2335.38 ml  Output 100 ml  Net 2235.38 ml   Filed Weights   11/27/22 1809 11/28/22 0923  Weight: 66.6 kg 43.7 kg    Exmintion:  Generl exm: ppers clm nd comfortble, lying curled on left side in fetl position,  little hrd of hering Respirtory system: unlbored Crdiovsculr system: RRR Gstrointestinl system: bdomen is nondistended, soft nd nontender.  Centrl nervous system: plesntly confused, moving ll extremities Extremities: no LEE   Dt Reviewed: I hve personlly reviewed following lbs nd imging studies  CBC: Recent Lbs  Lb 11/27/22 1836 11/27/22 1858 11/29/22 1144 11/30/22 0246  WBC 8.7  --  5.7 6.7  NEUTROBS 7.1  --   --   --   HGB 12.6 15.3* 11.3* 11.6*  HCT 39.9 45.0 34.6* 36.3  MCV 86.2  --  86.3 87.9  PLT 320  --  252 245    Bsic Metbolic Pnel: Recent Lbs  Lb 11/27/22 1836 11/27/22 1858 11/29/22 1144 11/30/22 0246  N 134* 137 138 136  K 4.1 4.3 3.5 3.8  CL 100 103 105 106  CO2 23  --  24 23  GLUCOSE 243* 232* 127* 172*  BUN 55* 58* 26* 21  CRETININE 1.83* 1.90* 1.35* 1.32*  CLCIUM 9.2  --  8.7* 8.6*  MG 2.2  --  1.5* 1.6*  PHOS  --   --  2.0* 1.7*    GFR: Estimted  Cretinine Clernce: 21.1 mL/min () (by [place] formul bsed on SCr of 1.32 mg/dL (H)).  Liver Function Tests: Recent Lbs  Lb 11/27/22 1836 11/29/22 1144 11/30/22 0246  ST 29  --   --   LT 19  --   --   LKPHOS 107  --   --   BILITOT 0.8  --   --   PROT 8.5*  --   --   LBUMIN 3.7 2.9* 2.7*    CBG: Recent Lbs  Lb 11/29/22 1122 11/29/22 1530 11/29/22 1909 11/29/22 2303 11/30/22 0309  GLUCP 115* 135* 148* 138* 171*     Recent Results (from the pst 240 hour(s))  MRS Next **Note De-Identified vi Obfusction** Gen by PCR, Nsl     Sttus: None   Collection Time: 11/28/22  8:51 AM   Specimen: Nsl Mucos; Nsl Swb  Result Vlue Ref Rnge Sttus   MRSA by PCR Next Gen NOT DETECTED NOT DETECTED Finl    Comment: (NOTE) The GeneXpert MRSA Assy (FDA pproved for NASAL specimens only), is one component of  comprehensive MRSA coloniztion surveillnce progrm. It is not intended to dignose MRSA infection nor to guide or monitor tretment for MRSA infections. Test performnce is not FDA pproved in ptients less thn 75 yers old. Performed t [facility], [address] [place], [place] [address]   MRSA Next Gen by PCR, Nsl     Sttus: None   Collection Time: 11/28/22  3:09 PM   Specimen: Nsl Mucos; Nsl Swb  Result Vlue Ref Rnge Sttus   MRSA by PCR Next Gen NOT DETECTED NOT DETECTED Finl    Comment: (NOTE) The GeneXpert MRSA Assy (FDA pproved for NASAL specimens only), is one component of  comprehensive MRSA coloniztion surveillnce progrm. It is not intended to dignose MRSA infection nor to guide or monitor tretment for MRSA infections. Test performnce is not FDA pproved in ptients less thn 57 yers old. Performed t [facility], [address] [place], [place] [address]          Rdiology Studies: No results found.      Scheduled Meds:  torvsttin  40 mg Orl Dily   Chlorhexidine Gluconte Cloth  6 ech Topicl Dily   feeding supplement  237  mL Orl BID BM   Gerhrdt's butt crem   Topicl BID   heprin injection (subcutneous)  5,000 Units Subcutneous Q8H   levothyroxine  88 mcg Orl Dily   mouth rinse  15 mL Mouth Rinse 4 times per dy   polyethylene glycol  17 g Orl BID   Continuous Infusions:  sodium chloride     cefTRIAXone (ROCEPHIN)  IV Stopped (11/29/22 2144)   lctted ringers 100 mL/hr t 11/30/22 0400   metroNIDAZOLE 500 mg (11/29/22 2201)     LOS: 2 dys    Time spent: over 30 min    Lcreti Nicks, MD [facility]   To contct the ttending provider between 7A-7P or the covering provider during fter hours 7P-7A, plese log into the web site [email or web address] nd ccess using [facility] [place] pssword for tht web site. If you do not hve the pssword, plese cll the hospitl opertor.  11/30/2022, 8:14 AM

## 2022-11-30 NOTE — Progress Notes (Signed)
OT Cancellation Note  Patient Details Name: Lindsey Mcguire MRN: [id number] DOB: 10/23/1934   Cancelled Treatment:    Reason Eval/Treat Not Completed: Fatigue/lethargy limiting ability to participate (Attempting to see pt but pt max difficulty arousing and unable to maintain arousal. PT and RN have additionally attempted to awaken pt today with no success.)  Myrla Halsted, OTD, OTR/L [facility] Office: [phone]   Myrla Halsted 11/30/2022, 1:58 PM

## 2022-12-01 ENCOUNTER — Inpatient Hospital Stay (HOSPITAL_COMMUNITY): Payer: Medicare PPO

## 2022-12-01 DIAGNOSIS — R609 Edema, unspecified: Secondary | ICD-10-CM | POA: Diagnosis not present

## 2022-12-01 DIAGNOSIS — K5289 Other specified noninfective gastroenteritis and colitis: Secondary | ICD-10-CM | POA: Diagnosis not present

## 2022-12-01 DIAGNOSIS — Z515 Encounter for palliative care: Secondary | ICD-10-CM | POA: Diagnosis not present

## 2022-12-01 LAB — RENAL FUNCTION PANEL
Albumin: 2.8 g/dL — ABNORMAL LOW (ref 3.5–5.0)
Anion gap: 10 (ref 5–15)
BUN: 15 mg/dL (ref 8–23)
CO2: 23 mmol/L (ref 22–32)
Calcium: 8.1 mg/dL — ABNORMAL LOW (ref 8.9–10.3)
Chloride: 105 mmol/L (ref 98–111)
Creatinine, Ser: 1.21 mg/dL — ABNORMAL HIGH (ref 0.44–1.00)
GFR, Estimated: 44 mL/min — ABNORMAL LOW (ref >60)
Glucose, Bld: 138 mg/dL — ABNORMAL HIGH (ref 70–99)
Phosphorus: 2.8 mg/dL (ref 2.5–4.6)
Potassium: 3.3 mmol/L — ABNORMAL LOW (ref 3.5–5.1)
Sodium: 138 mmol/L (ref 135–145)

## 2022-12-01 LAB — GLUCOSE, CAPILLARY
Glucose-Capillary: 153 mg/dL — ABNORMAL HIGH (ref 70–99)
Glucose-Capillary: 69 mg/dL — ABNORMAL LOW (ref 70–99)
Glucose-Capillary: 78 mg/dL (ref 70–99)
Glucose-Capillary: 94 mg/dL (ref 70–99)
Glucose-Capillary: 97 mg/dL (ref 70–99)

## 2022-12-01 LAB — CBC
HCT: 34.3 % — ABNORMAL LOW (ref 36.0–46.0)
HCT: 32.8 % — ABNORMAL LOW (ref 36.0–46.0)
Hemoglobin: 11.2 g/dL — ABNORMAL LOW (ref 12.0–15.0)
Hemoglobin: 10.5 g/dL — ABNORMAL LOW (ref 12.0–15.0)
MCH: 27.4 pg (ref 26.0–34.0)
MCH: 27.1 pg (ref 26.0–34.0)
MCHC: 32.7 g/dL (ref 30.0–36.0)
MCHC: 32 g/dL (ref 30.0–36.0)
MCV: 83.9 fL (ref 80.0–100.0)
MCV: 84.8 fL (ref 80.0–100.0)
Platelets: 254 10*3/uL (ref 150–400)
Platelets: 258 10*3/uL (ref 150–400)
RBC: 4.09 MIL/uL (ref 3.87–5.11)
RBC: 3.87 MIL/uL (ref 3.87–5.11)
RDW: 14.2 % (ref 11.5–15.5)
RDW: 14.2 % (ref 11.5–15.5)
WBC: 4.9 10*3/uL (ref 4.0–10.5)
WBC: 5.1 10*3/uL (ref 4.0–10.5)
nRBC: 0 % (ref 0.0–0.2)
nRBC: 0 % (ref 0.0–0.2)

## 2022-12-01 MED ORDER — K PHOS MONO-SOD PHOS DI & MONO 155-852-130 MG PO TABS
500.0000 mg | ORAL_TABLET | Freq: Four times a day (QID) | ORAL | Status: AC
  Administered 2022-12-01 – 2022-12-02 (×4): 500 mg via ORAL
  Filled 2022-12-01 (×4): qty 2

## 2022-12-01 MED ORDER — RISAQUAD PO CAPS
2.0000 | ORAL_CAPSULE | Freq: Three times a day (TID) | ORAL | Status: DC
  Administered 2022-12-01 – 2022-12-18 (×50): 2 via ORAL
  Filled 2022-12-01 (×52): qty 2

## 2022-12-01 MED ORDER — PANTOPRAZOLE SODIUM 40 MG PO TBEC
40.0000 mg | DELAYED_RELEASE_TABLET | Freq: Every day | ORAL | Status: DC
  Administered 2022-12-02 – 2022-12-18 (×17): 40 mg via ORAL
  Filled 2022-12-01 (×18): qty 1

## 2022-12-01 NOTE — Progress Notes (Signed)
Notified by RN: noticed some Blood per rectum Otherwise pt was found stable, no complaints.  Blood pressure (!) 145/88, pulse 79,  RR 18 SpO2 100%.    Latest Ref Rng & Units 12/01/2022    4:21 AM 11/30/2022    2:46 AM 11/29/2022   11:44 AM  CBC  WBC 4.0 - 10.5 K/uL 4.9  6.7  5.7   Hemoglobin 12.0 - 15.0 g/dL 24.4  01.0  27.2   Hematocrit 36.0 - 46.0 % 34.3  36.3  34.6   Platelets 150 - 400 K/uL 254  245  252    -CBC ordered for now and in AM  - Hemoccult ordered  - Heparin DVT prophylaxis D/Ced - SCDs Ordered. Sandria Manly consulted       SIGNED: Kendell Bane, MD, FHM. FAAFP [facility],  Pager (please use [email or web address] to page/text)  Please use Epic Secure Chat for non-urgent communication (7AM-7PM) If 7PM-7AM, please contact night-coverage [email or web address],  12/01/2022, 7:31 PM

## 2022-12-01 NOTE — Progress Notes (Addendum)
PROGRESS NOTE    Lindsey Mcguire  ZOX:[id number] DOB: 26-Sep-1934 DOA: 11/27/2022 PCP: Lorenda Ishihara, MD  Chief Complaint  Patient presents with   Diarrhea    Brief Narrative:      Lindsey Mcguire is a 88 year old female with hypertension, hyperlipidemia, hypothyroidism, dementia, HFpEF, type 2 diabetes mellitus, anemia, stage IV CKD, osteoporosis who had recently been discharged on 10/28/2022 from [facility] after being treated for dehydration, deconditioning, failure to thrive, weakness, sepsis from cystitis and was subsequently discharged to SNF and remained in therapy for 2 weeks subsequently discharged home.  Her husband recently passed away.  She lives with her son and grandson.  Family reported that she has been having diarrhea for the last several days and noticed some blood in her stool.  They brought her to the emergency department for further evaluation.  She was noted to be extremely debilitated cachectic and had severe fecal impaction with a large stool ball which required disimpaction in the ED and stercoral colitis noted.  She was also noted to be hypotensive and dehydrated and eventually required low-dose norepinephrine infusion to maintain blood pressure.  She was started on antibiotic therapy and admission requested for further management.   [facility].  Admitted for sepsis in setting of sterocral colitis and UTI.   -----------------------------------------------------------------------------------------------------------------------------------------   Subjective: The patient was seen and examined this morning, laying in bed comfortable, pleasantly confused, no acute distress Hemodynamically stable    Assessment & Plan:   Principal Problem:   Stercoral colitis Active Problems:   Failure to thrive in adult   Type 2 diabetes mellitus with stage 4 chronic kidney disease, without long-term current use of insulin (HCC)    Hypotension   (HFpEF) heart failure with preserved ejection fraction (HCC)   Hypertension   Dementia (HCC)   Hypothyroidism   Hyperlipidemia   Fecal impaction (HCC)   Chronic constipation   Chronic kidney disease (CKD), stage IV (severe) (HCC)   Hyperglycemia   UTI (urinary tract infection)  Sepsis due to UTI and Stercoral Colitis -Sepsis physiology resolved -Hemodynamically stable  POA: CT with dilated and stool filled rectum with wall thickening and surrounding inflammatory stranding (concerning for stercoral colitis/proctitis with fecal impaction)  S/p disimpaction by pccm -> bowel regimen with BID miralax  -Continue antibiotics Will extend abx coverage for UTI to 7 days given presentation with sepsis Unfortunately no urine or blood culture data   Acute Metabolic Encephalopathy  -At baseline, pleasantly confused Hx Dementia  Delirium precautions  AKI on CKD IIIB improving Lab Results  Component Value Date   CREATININE 1.21 (H) 12/01/2022   CREATININE 1.32 (H) 11/30/2022   CREATININE 1.35 (H) 11/29/2022    Hypomagnesemia Hypophosphatemia Replace and follow   Failure to thrive Severe protein calorie malnutrition Appreciate palliative care assistance with goals of care Body mass index is 18.82 kg/m.  Hypothyroidism Synthroid  Anemia of chronic disease with chronic iron deficiency Hold iron with constipation Mild and stable    DVT prophylaxis: heparin Code Status: DNR  Family Communication: none Disposition:   Status is: Inpatient Remains inpatient appropriate because: need for further inpatient care   Plan to discharge in 1-2 days SNF vs home with home health-versus hospice  Consultants:  PCCM  Procedures:  none  Antimicrobials:  Anti-infectives (From admission, onward)    Start     Dose/Rate Route Frequency Ordered Stop   11/30/22 0838  cefadroxil (DURICEF) capsule 500 mg  500 mg Oral Daily at bedtime 11/30/22 0826 12/04/22 2159    11/29/22 2200  cefTRIAXone (ROCEPHIN) 1 g in sodium chloride 0.9 % 100 mL IVPB  Status:  Discontinued        1 g 200 mL/hr over 30 Minutes Intravenous Every 24 hours 11/29/22 1034 11/30/22 0843   11/28/22 2200  cefTRIAXone (ROCEPHIN) 1 g in sodium chloride 0.9 % 100 mL IVPB  Status:  Discontinued        1 g 200 mL/hr over 30 Minutes Intravenous Every 24 hours 11/28/22 0731 11/28/22 0931   11/28/22 2200  cefTRIAXone (ROCEPHIN) 2 g in sodium chloride 0.9 % 100 mL IVPB  Status:  Discontinued        2 g 200 mL/hr over 30 Minutes Intravenous Every 24 hours 11/28/22 0931 11/29/22 1034   11/28/22 1000  metroNIDAZOLE (FLAGYL) IVPB 500 mg        500 mg 100 mL/hr over 60 Minutes Intravenous Every 12 hours 11/28/22 0731 11/30/22 2205   11/27/22 2300  cefTRIAXone (ROCEPHIN) 1 g in sodium chloride 0.9 % 100 mL IVPB        1 g 200 mL/hr over 30 Minutes Intravenous  Once 11/27/22 2251 11/28/22 0240   11/27/22 2300  metroNIDAZOLE (FLAGYL) IVPB 500 mg        500 mg 100 mL/hr over 60 Minutes Intravenous  Once 11/27/22 2251 11/28/22 0157        Objective: Vitals:   11/30/22 2014 12/01/22 0034 12/01/22 0507 12/01/22 0849  BP: (!) 147/83 (!) 149/87 (!) 149/84 (!) 141/75  Pulse: 80 79 82 80  Resp: 18 18 18 18   Temp: (!) 97.3 F (36.3 C) (!) 97.4 F (36.3 C) 98 F (36.7 C) (!) 97.5 F (36.4 C)  TempSrc: Oral Oral Oral   SpO2: 96% 100% 100% 100%  Weight:      Height:        Intake/Output Summary (Last 24 hours) at 12/01/2022 1257 Last data filed at 12/01/2022 0941 Gross per 24 hour  Intake 465.58 ml  Output 1150 ml  Net -684.42 ml   Filed Weights   11/27/22 1809 11/28/22 0923  Weight: 66.6 kg 43.7 kg         General:  AAO x 2,  cooperative, no distress;   HEENT:  Normocephalic, PERRL, otherwise with in Normal limits   Neuro:  CNII-XII intact. , normal motor and sensation, reflexes intact   Lungs:   Clear to auscultation BL, Respirations unlabored,  No wheezes / crackles  Cardio:     S1/S2, RRR, No murmure, No Rubs or Gallops   Abdomen:  Soft, non-tender, bowel sounds active all four quadrants, no guarding or peritoneal signs.  Muscular  skeletal:  Limited exam -global generalized weaknesses - in bed, able to move all 4 extremities,   2+ pulses,  symmetric, No pitting edema  Skin:  Dry, warm to touch, negative for any Rashes,  Wounds: Please see nursing documentation  Pressure Injury 02/06/19 Sacrum Stage II -  Partial thickness loss of dermis presenting as a shallow open ulcer with a red, pink wound bed without slough. stage 2 to sacrum .5 x .5 (Active)  02/06/19 0403  Location: Sacrum  Location Orientation:   Staging: Stage II -  Partial thickness loss of dermis presenting as a shallow open ulcer with a red, pink wound bed without slough.  Wound Description (Comments): stage 2 to sacrum .5 x .5  Present on Admission: Yes  Pressure Injury 02/06/19 Sacrum Medial Stage II -  Partial thickness loss of dermis presenting as a shallow open ulcer with a red, pink wound bed without slough. 1 x .5 (Active)  02/06/19 0404  Location: Sacrum  Location Orientation: Medial  Staging: Stage II -  Partial thickness loss of dermis presenting as a shallow open ulcer with a red, pink wound bed without slough.  Wound Description (Comments): 1 x .5  Present on Admission: Yes          Data Reviewed: I have personally reviewed following labs and imaging studies  CBC: Recent Labs  Lab 11/27/22 1836 11/27/22 1858 11/29/22 1144 11/30/22 0246 12/01/22 0421  WBC 8.7  --  5.7 6.7 4.9  NEUTROABS 7.1  --   --   --   --   HGB 12.6 15.3* 11.3* 11.6* 11.2*  HCT 39.9 45.0 34.6* 36.3 34.3*  MCV 86.2  --  86.3 87.9 83.9  PLT 320  --  252 245 254    Basic Metabolic Panel: Recent Labs  Lab 11/27/22 1836 11/27/22 1858 11/29/22 1144 11/30/22 0246 12/01/22 0421  NA 134* 137 138 136 138  K 4.1 4.3 3.5 3.8 3.3*  CL 100 103 105 106 105  CO2 23  --  24 23 23   GLUCOSE 243* 232*  127* 172* 138*  BUN 55* 58* 26* 21 15  CREATININE 1.83* 1.90* 1.35* 1.32* 1.21*  CALCIUM 9.2  --  8.7* 8.6* 8.1*  MG 2.2  --  1.5* 1.6*  --   PHOS  --   --  2.0* 1.7* 2.8    GFR: Estimated Creatinine Clearance: 23 mL/min (A) (by C-G formula based on SCr of 1.21 mg/dL (H)).  Liver Function Tests: Recent Labs  Lab 11/27/22 1836 11/29/22 1144 11/30/22 0246 12/01/22 0421  AST 29  --   --   --   ALT 19  --   --   --   ALKPHOS 107  --   --   --   BILITOT 0.8  --   --   --   PROT 8.5*  --   --   --   ALBUMIN 3.7 2.9* 2.7* 2.8*    CBG: Recent Labs  Lab 11/30/22 1107 11/30/22 2016 12/01/22 0036 12/01/22 1103 12/01/22 1252  GLUCAP 134* 94 153* 94 69*     Recent Results (from the past 240 hour(s))  MRSA Next Gen by PCR, Nasal     Status: None   Collection Time: 11/28/22  8:51 AM   Specimen: Nasal Mucosa; Nasal Swab  Result Value Ref Range Status   MRSA by PCR Next Gen NOT DETECTED NOT DETECTED Final    Comment: (NOTE) The GeneXpert MRSA Assay (FDA approved for NASAL specimens only), is one component of a comprehensive MRSA colonization surveillance program. It is not intended to diagnose MRSA infection nor to guide or monitor treatment for MRSA infections. Test performance is not FDA approved in patients less than 82 years old. Performed at [facility], [address], [place], [place] [address]   MRSA Next Gen by PCR, Nasal     Status: None   Collection Time: 11/28/22  3:09 PM   Specimen: Nasal Mucosa; Nasal Swab  Result Value Ref Range Status   MRSA by PCR Next Gen NOT DETECTED NOT DETECTED Final    Comment: (NOTE) The GeneXpert MRSA Assay (FDA approved for NASAL specimens only), is one component of a comprehensive MRSA colonization surveillance program. It is not intended  to diagnose MRSA infection nor to guide or monitor treatment for MRSA infections. Test performance is not FDA approved in patients less than 70 years old. Performed at [facility],  [address], [place], [place] [address]          Radiology Studies: No results found.      Scheduled Meds:  acidophilus  2 capsule Oral TID   atorvastatin  40 mg Oral Daily   cefadroxil  500 mg Oral QHS   Chlorhexidine Gluconate Cloth  6 each Topical Daily   feeding supplement  237 mL Oral BID BM   Gerhardt's butt cream   Topical BID   heparin injection (subcutaneous)  5,000 Units Subcutaneous Q8H   levothyroxine  88 mcg Oral Daily   mouth rinse  15 mL Mouth Rinse 4 times per day   phosphorus  500 mg Oral QID   polyethylene glycol  17 g Oral BID   Continuous Infusions:  sodium chloride     lactated ringers 75 mL/hr at 12/01/22 0932     LOS: 3 days    Time spent: over 50 min    Kendell Bane, MD [facility]   To contact the attending provider between 7A-7P or the covering provider during after hours 7P-7A, please log into the web site [email or web address] and access using universal [place] password for that web site. If you do not have the password, please call the hospital operator.  12/01/2022, 12:57 PM

## 2022-12-01 NOTE — Plan of Care (Signed)

## 2022-12-01 NOTE — Plan of Care (Signed)
  Problem: Clinical Measurements: Goal: Ability to maintain clinical measurements within normal limits will improve Outcome: Progressing   

## 2022-12-01 NOTE — Progress Notes (Signed)
RUE venous duplex has been completed.   Results can be found under chart review under CV PROC. 12/01/2022 7:06 PM  RVT, RDMS

## 2022-12-01 NOTE — Progress Notes (Signed)
Patient was bleeding from her rectum with some small clot  which looks like a old blood. MD aware.

## 2022-12-01 NOTE — Evaluation (Signed)
Occupational Therapy Evaluation Patient Details Name: Lindsey Mcguire MRN: [id number] DOB: 04/29/1935 Today's Date: 12/01/2022   History of Present Illness 88 year old female who had recently been discharged to SNF on 10/28/2022 from [facility] after being treated for dehydration, deconditioning, failure to thrive, weakness, sepsis from cystitis and remained in therapy for 2 weeks subsequently discharged home. She lives with her son and grandson. They noticed pt with blood in stools and brought pt to ED 11/28/22. Noted to have severe fecal impaction and disimpacted. + hypotensive and dehydrated. PMH hypertension, hyperlipidemia, hypothyroidism, dementia, HFpEF, type 2 diabetes mellitus, anemia, stage IV CKD, osteoporosis   Clinical Impression   Pt questionable historian, thus unclear whether pt is presenting at or below baseline. Pt currently requiring total A for all ADL and mobility; predominantly limited by max confusion and inability to follow commands. Pt previously at a SNF and recently sent home but unsure assist levels required at home. Pt able to move and use BUE to scratch back during session, but due to lack of response to commands, or multimodal cueing. Will continue to follow and update recommendations as appropriate. Currently recommending inpatient rehabilitation <3 hours/day.      Recommendations for follow up therapy are one component of a multi-disciplinary discharge planning process, led by the attending physician.  Recommendations may be updated based on patient status, additional functional criteria and insurance authorization.   Assistance Recommended at Discharge Frequent or constant Supervision/Assistance  Patient can return home with the following Two people to help with walking and/or transfers;A lot of help with bathing/dressing/bathroom;Two people to help with bathing/dressing/bathroom;Assistance with cooking/housework;Assistance with feeding;Direct supervision/assist  for medications management;Direct supervision/assist for financial management;Assist for transportation;Help with stairs or ramp for entrance    Functional Status Assessment  Patient has had a recent decline in their functional status and/or demonstrates limited ability to make significant improvements in function in a reasonable and predictable amount of time  Equipment Recommendations  Other (comment) (defer)    Recommendations for Other Services       Precautions / Restrictions Precautions Precautions: Fall Restrictions Weight Bearing Restrictions: No      Mobility Bed Mobility Overal bed mobility: Needs Assistance Bed Mobility: Supine to Sit, Sit to Supine     Supine to sit: +2 for physical assistance Sit to supine: +2 for physical assistance   General bed mobility comments: Pt did not initiate or follow cues/commands for sequencing pt was total assist at LE and trunk for supine<>sitting    Transfers                   General transfer comment: Pt was resistant to mobility, multiple attempts to get pt to stand. pt would say okay then stay sitting, heavy multi modal cueing and encouragement without success.      Balance Overall balance assessment: Needs assistance Sitting-balance support: No upper extremity supported, Single extremity supported Sitting balance-Leahy Scale: Poor Sitting balance - Comments: Heavy L lean initially improved to sitting EOB with SBA Postural control: Left lateral lean     Standing balance comment: Unable to get to standing.                           ADL either performed or assessed with clinical judgement   ADL Overall ADL's : Needs assistance/impaired Eating/Feeding: Total assistance   Grooming: Total assistance   Upper Body Bathing: Total assistance   Lower Body Bathing: Total assistance   Upper Body  Dressing : Total assistance   Lower Body Dressing: Total assistance   Toilet Transfer: Total assistance    Toileting- Clothing Manipulation and Hygiene: Total assistance         General ADL Comments: total A due to pooor ability to follow commands and no initiation to do so despite max multimodal cueing     Vision Patient Visual Report: Other (comment) (Pt unable to report) Additional Comments: making good eye contact with therapist     Perception Perception Perception Tested?: No   Praxis      Pertinent Vitals/Pain Pain Assessment Pain Assessment: Faces Faces Pain Scale: Hurts even more Pain Location: headache Pain Descriptors / Indicators: Aching Pain Intervention(s): Limited activity within patient's tolerance, Monitored during session     Hand Dominance     Extremity/Trunk Assessment Upper Extremity Assessment Upper Extremity Assessment: Generalized weakness   Lower Extremity Assessment Lower Extremity Assessment: Defer to PT evaluation   Cervical / Trunk Assessment Cervical / Trunk Assessment: Kyphotic   Communication Communication Communication: HOH   Cognition Arousal/Alertness: Awake/alert Behavior During Therapy: Agitated, Flat affect Overall Cognitive Status: No family/caregiver present to determine baseline cognitive functioning                                 General Comments: Pt has no initiation and does not respond well to heavy multi modal cueing and encouragement     General Comments  Pt scratching back throughout session; no open skin tears noted. OT rubbing pt back with wash cloth    Exercises     Shoulder Instructions      Home Living Family/patient expects to be discharged to:: Private residence Living Arrangements: Children (son and grandson) Available Help at Discharge: Family Type of Home: Mobile home Home Access: Stairs to enter Entrance Stairs-Number of Steps: 6 Entrance Stairs-Rails: Right;Left;Can reach both Home Layout: One level           Bathroom Accessibility: Yes   Home Equipment: Agricultural consultant (2  wheels);Cane - single point   Additional Comments: Home set up and equipment taken from pt previous hospitalization.      Prior Functioning/Environment Prior Level of Function : Patient poor historian/Family not available                        OT Problem List: Decreased strength;Decreased activity tolerance;Impaired balance (sitting and/or standing);Decreased cognition;Decreased safety awareness;Decreased knowledge of use of DME or AE      OT Treatment/Interventions: Self-care/ADL training;Therapeutic exercise;DME and/or AE instruction;Balance training;Patient/family education;Therapeutic activities;Cognitive remediation/compensation    OT Goals(Current goals can be found in the care plan section) Acute Rehab OT Goals Patient Stated Goal: unable OT Goal Formulation: With patient Time For Goal Achievement: 12/15/22 Potential to Achieve Goals: Fair  OT Frequency: Min 1X/week    Co-evaluation PT/OT/SLP Co-Evaluation/Treatment: Yes Reason for Co-Treatment: Necessary to address cognition/behavior during functional activity;For patient/therapist safety;To address functional/ADL transfers;Complexity of the patient's impairments (multi-system involvement) PT goals addressed during session: Mobility/safety with mobility;Balance OT goals addressed during session: ADL's and self-care      AM-PAC OT "6 Clicks" Daily Activity     Outcome Measure Help from another person eating meals?: Total Help from another person taking care of personal grooming?: Total Help from another person toileting, which includes using toliet, bedpan, or urinal?: Total Help from another person bathing (including washing, rinsing, drying)?: Total Help from another person to put on and  taking off regular upper body clothing?: Total Help from another person to put on and taking off regular lower body clothing?: Total 6 Click Score: 6   End of Session Nurse Communication: Mobility status  Activity  Tolerance: Patient tolerated treatment well Patient left: in bed;with call bell/phone within reach;with bed alarm set  OT Visit Diagnosis: Unsteadiness on feet (R26.81);Muscle weakness (generalized) (M62.81);Other symptoms and signs involving cognitive function                Time: 1254-1315 OT Time Calculation (min): 21 min Charges:  OT General Charges $OT Visit: 1 Visit OT Evaluation $OT Eval Moderate Complexity: 1 Mod  Lindsey Mcguire, Lindsey Mcguire [facility] Acute Rehabilitation Office: [phone]   Lindsey Mcguire 12/01/2022, 2:18 PM

## 2022-12-01 NOTE — Plan of Care (Signed)
?  Problem: Clinical Measurements: ?Goal: Will remain free from infection ?Outcome: Progressing ?  ?

## 2022-12-01 NOTE — Evaluation (Signed)
Physical Therapy Evaluation Patient Details Name: Lindsey Mcguire MRN: [id number] DOB: 04/28/1935 Today's Date: 12/01/2022  History of Present Illness  88 year old female who had recently been discharged to SNF on 10/28/2022 from [facility] after being treated for dehydration, deconditioning, failure to thrive, weakness, sepsis from cystitis and remained in therapy for 2 weeks subsequently discharged home. She lives with her son and grandson. They noticed pt with blood in stools and brought pt to ED 11/28/22. Noted to have severe fecal impaction and disimpacted. + hypotensive and dehydrated. PMH hypertension, hyperlipidemia, hypothyroidism, dementia, HFpEF, type 2 diabetes mellitus, anemia, stage IV CKD, osteoporosis  Clinical Impression  Unclear if pt is presenting below baseline level of functioning. Pt currently is total assist for all functional mobility with no initiation and unable to follow instructions. Pt previously was at a skilled nursing facility and sent home but unsure of what level. Pt may not be appropriate for skilled physical therapy intervention but will follow to continue to assess for appropriateness at low frequency level at this time. Currently tentative recommendation for skilled physical therapy services on discharge from acute care hospital setting in order to decrease burden of care on family. Pt demonstrates no signs/symptoms of cardiac/respiratory distress throughout session. Pt tolerated session well BP WNL sitting EOB after pt reported headache in sitting. Nursing is aware.       If plan is discharge home, recommend the following: Two people to help with walking and/or transfers;Assist for transportation;Assistance with cooking/housework;Help with stairs or ramp for entrance   Can travel by private vehicle   No    Equipment Recommendations Wheelchair cushion (measurements PT);Wheelchair (measurements PT);Other (comment);Hospital bed (hoyer lift)  Recommendations  for Other Services       Functional Status Assessment Patient has had a recent decline in their functional status and demonstrates the ability to make significant improvements in function in a reasonable and predictable amount of time.     Precautions / Restrictions Precautions Precautions: Fall Restrictions Weight Bearing Restrictions: No      Mobility  Bed Mobility Overal bed mobility: Needs Assistance Bed Mobility: Supine to Sit, Sit to Supine     Supine to sit: +2 for physical assistance Sit to supine: +2 for physical assistance   General bed mobility comments: Pt did not initiate or follow cues/commands for sequencing pt was total assist at LE and trunk for supine<>sitting Patient Response: Cooperative  Transfers       General transfer comment: Pt was resistant to mobility, multiple attempts to get pt to stand. pt would say okay then stay sitting, heavy multi modal cueing and encouragement without success.    Ambulation/Gait     General Gait Details: unable to progress.    Tilt Bed Tilt Bed Patient Response: Cooperative  Modified Rankin (Stroke Patients Only)       Balance Overall balance assessment: Needs assistance Sitting-balance support: No upper extremity supported, Single extremity supported Sitting balance-Leahy Scale: Poor Sitting balance - Comments: Heavy L lean initially improved to sitting EOB with SBA Postural control: Left lateral lean     Standing balance comment: Unable to get to standing.         Pertinent Vitals/Pain Pain Assessment Pain Assessment: Faces Faces Pain Scale: Hurts even more Breathing: normal Negative Vocalization: occasional moan/groan, low speech, negative/disapproving quality Facial Expression: sad, frightened, frown Body Language: tense, distressed pacing, fidgeting Consolability: no need to console PAINAD Score: 3 Pain Location: headache Pain Descriptors / Indicators: Aching Pain Intervention(s): Other  (comment), Monitored  during session (RN notified)    Home Living Family/patient expects to be discharged to:: Private residence Living Arrangements: Children (Pt lives with her son and grandson) Available Help at Discharge: Family Type of Home: Mobile home Home Access: Stairs to enter Entrance Stairs-Rails: Right;Left;Can reach both Entrance Stairs-Number of Steps: 6   Home Layout: One level Home Equipment: Agricultural consultant (2 wheels);Cane - single point Additional Comments: Home set up and equipment taken from pt previous hospitalization.    Prior Function Prior Level of Function : Other (comment) (unclear pt is a poor historian)            Extremity/Trunk Assessment   Upper Extremity Assessment Upper Extremity Assessment: Defer to OT evaluation    Lower Extremity Assessment Lower Extremity Assessment: Generalized weakness;Difficult to assess due to impaired cognition    Cervical / Trunk Assessment Cervical / Trunk Assessment: Kyphotic  Communication   Communication: HOH  Cognition Arousal/Alertness: Awake/alert Behavior During Therapy: Agitated, Flat affect Overall Cognitive Status: No family/caregiver present to determine baseline cognitive functioning     General Comments: Pt has no initiation and does not respond well to heavy multi modal cueing and encouragement        General Comments General comments (skin integrity, edema, etc.): Pt was scratching her back, no skin openings noted. Pt was offered wash cloth rub from OT        Assessment/Plan    PT Assessment Patient needs continued PT services  PT Problem List Decreased mobility       PT Treatment Interventions DME instruction;Therapeutic activities;Gait training;Therapeutic exercise;Patient/family education;Stair training;Balance training;Functional mobility training;Neuromuscular re-education    PT Goals (Current goals can be found in the Care Plan section)  Acute Rehab PT Goals Patient Stated  Goal: None stated pt unable to participate and family not available at this time. PT Goal Formulation: Patient unable to participate in goal setting Time For Goal Achievement: 12/15/22 Potential to Achieve Goals: Fair    Frequency Min 1X/week     Co-evaluation PT/OT/SLP Co-Evaluation/Treatment: Yes Reason for Co-Treatment: Necessary to address cognition/behavior during functional activity;For patient/therapist safety;To address functional/ADL transfers;Complexity of the patient's impairments (multi-system involvement) PT goals addressed during session: Mobility/safety with mobility;Balance         AM-PAC PT "6 Clicks" Mobility  Outcome Measure Help needed turning from your back to your side while in a flat bed without using bedrails?: Total Help needed moving from lying on your back to sitting on the side of a flat bed without using bedrails?: Total Help needed moving to and from a bed to a chair (including a wheelchair)?: Total Help needed standing up from a chair using your arms (e.g., wheelchair or bedside chair)?: Total Help needed to walk in hospital room?: Total Help needed climbing 3-5 steps with a railing? : Total 6 Click Score: 6    End of Session   Activity Tolerance: Patient limited by lethargy;Other (comment) (pt limited by cognition) Patient left: in bed;with call bell/phone within reach;with bed alarm set Nurse Communication: Mobility status PT Visit Diagnosis: Other abnormalities of gait and mobility (R26.89)    Time: 8119-1478 PT Time Calculation (min) (ACUTE ONLY): 21 min   Charges:   PT Evaluation $PT Eval Low Complexity: 1 Low   PT General Charges $$ ACUTE PT VISIT: 1 Visit         Harrel Carina, DPT, CLT  Acute Rehabilitation Services Office: [phone] (Secure chat preferred)   Claudia Desanctis 12/01/2022, 1:38 PM

## 2022-12-01 NOTE — Progress Notes (Addendum)
Daily Progress Note   Patient Name: Lindsey Mcguire       Date: 12/01/2022 DOB: December 04, 1934  Age: 88 y.o. MRN#: [id number] Attending Physician: Kendell Bane, MD Primary Care Physician: Lorenda Ishihara, MD Admit Date: 11/27/2022  Reason for Consultation/Follow-up: Establishing goals of care  Length of Stay: 3  Current Medications: Scheduled Meds:   acidophilus  2 capsule Oral TID   atorvastatin  40 mg Oral Daily   cefadroxil  500 mg Oral QHS   Chlorhexidine Gluconate Cloth  6 each Topical Daily   feeding supplement  237 mL Oral BID BM   Gerhardt's butt cream   Topical BID   heparin injection (subcutaneous)  5,000 Units Subcutaneous Q8H   levothyroxine  88 mcg Oral Daily   mouth rinse  15 mL Mouth Rinse 4 times per day   phosphorus  500 mg Oral QID   polyethylene glycol  17 g Oral BID    Continuous Infusions:  sodium chloride     lactated ringers 75 mL/hr at 12/01/22 0932    PRN Meds: acetaminophen **OR** acetaminophen, fentaNYL (SUBLIMAZE) injection, ondansetron **OR** ondansetron (ZOFRAN) IV, mouth rinse, oxyCODONE  Physical Exam Vitals reviewed.  Constitutional:      General: She is awake.     Appearance: She is ill-appearing.  HENT:     Head: Normocephalic and atraumatic.  Pulmonary:     Effort: Pulmonary effort is normal.  Skin:    General: Skin is warm and dry.  Psychiatric:        Behavior: Behavior is cooperative.             Vital Signs: BP (!) 141/75 (BP Location: Right Arm)   Pulse 80   Temp (!) 97.5 F (36.4 C)   Resp 18   Ht 5' (1.524 m)   Wt 43.7 kg   SpO2 100%   BMI 18.82 kg/m  SpO2: SpO2: 100 % O2 Device: O2 Device: Room Air O2 Flow Rate:    Intake/output summary:  Intake/Output Summary (Last 24 hours) at 12/01/2022 1011 Last  data filed at 12/01/2022 0941 Gross per 24 hour  Intake 465.58 ml  Output 1150 ml  Net -684.42 ml   LBM: Last BM Date : 12/01/22 Baseline Weight: Weight: 66.6 kg Most recent weight: Weight: 43.7 kg   Palliative Care Assessment &  Plan   Patient Profile: 88 y.o. female  with past medical history of dementia, HTN, HLD, HFpEF, hypothyroidism, diabetes, anemia, CKD stage IV, osteoporosis, recent hospitalization for UTI/sepsis 6/22-10/28/22 followed by rehab stay admitted on 11/27/2022 with diarrhea and blood in stool with stercoral colitis with disimpaction in ED. Being treated for UTI. No longer requiring vasopressor support. No longer in the ICU.   Assessment: Patient looked much better this morning than yesterday. Yesterday she did not work with PT or OT as they were unable to wake her up. Today she was awake and alert. She responded she was "doing fine" when asked how she was today. She was eating breakfast with the nurses help. Plan is to meet with her family this afternoon to continue the discuss around goals of care.  12:30. Messaged by RN that family was at bedside. The patient's brother and niece were at bedside. They called son Kathlene November and over the phone we discussed the patient's current condition. We discussed her increased frequency in hospitalizations and decrease in functional status. Kathlene November tells me the plan is for his brother and aunt to meet him then he will call me to discuss goals.  16:30. Left a voicemail for son Kathlene November.  Recommendations/Plan: DNR Continue to treat the treatable Allow time for outcomes Plan for family meeting to discuss goals of care this afternoon Ongoing PMT support   Code Status:    Code Status Orders  (From admission, onward)           Start     Ordered   11/28/22 1310  Do not attempt resuscitation (DNR)  Continuous       Question Answer Comment  If patient has no pulse and is not breathing Do Not Attempt Resuscitation   If patient has a pulse  and/or is breathing: Medical Treatment Goals LIMITED ADDITIONAL INTERVENTIONS: Use medication/IV fluids and cardiac monitoring as indicated; Do not use intubation or mechanical ventilation (DNI), also provide comfort medications.  Transfer to Progressive/Stepdown as indicated, avoid Intensive Care.   Consent: Discussion documented in EHR or advanced directives reviewed      11/28/22 1310         Care plan was discussed with bedside RN  Extensive chart review has been completed prior to meeting with patient/family including labs, vital signs, imaging, progress/consult notes, orders, medications, and available advance directive documents.   Time spent: 35 minutes  Thank you for allowing the Palliative Medicine Team to assist in the care of this patient.   Sherryll Burger, NP  Please contact Palliative Medicine Team phone at [phone] for questions and concerns.

## 2022-12-02 DIAGNOSIS — F0390 Unspecified dementia without behavioral disturbance: Secondary | ICD-10-CM

## 2022-12-02 DIAGNOSIS — K5909 Other constipation: Secondary | ICD-10-CM | POA: Diagnosis not present

## 2022-12-02 DIAGNOSIS — Z7189 Other specified counseling: Secondary | ICD-10-CM | POA: Diagnosis not present

## 2022-12-02 DIAGNOSIS — K5289 Other specified noninfective gastroenteritis and colitis: Secondary | ICD-10-CM | POA: Diagnosis not present

## 2022-12-02 LAB — GLUCOSE, CAPILLARY
Glucose-Capillary: 94 mg/dL (ref 70–99)
Glucose-Capillary: 76 mg/dL (ref 70–99)
Glucose-Capillary: 78 mg/dL (ref 70–99)
Glucose-Capillary: 67 mg/dL — ABNORMAL LOW (ref 70–99)
Glucose-Capillary: 140 mg/dL — ABNORMAL HIGH (ref 70–99)
Glucose-Capillary: 113 mg/dL — ABNORMAL HIGH (ref 70–99)
Glucose-Capillary: 153 mg/dL — ABNORMAL HIGH (ref 70–99)

## 2022-12-02 LAB — TYPE AND SCREEN
ABO/RH(D): O POS
Antibody Screen: NEGATIVE

## 2022-12-02 LAB — HEMOGLOBIN AND HEMATOCRIT, BLOOD
HCT: 30.6 % — ABNORMAL LOW (ref 36.0–46.0)
HCT: 27.7 % — ABNORMAL LOW (ref 36.0–46.0)
Hemoglobin: 9.9 g/dL — ABNORMAL LOW (ref 12.0–15.0)
Hemoglobin: 8.9 g/dL — ABNORMAL LOW (ref 12.0–15.0)

## 2022-12-02 MED ORDER — HYDROCORTISONE 100 MG/60ML RE ENEM
100.0000 mg | ENEMA | Freq: Every day | RECTAL | Status: DC
  Administered 2022-12-02 – 2022-12-17 (×16): 100 mg via RECTAL
  Filled 2022-12-02 (×19): qty 1

## 2022-12-02 MED ORDER — HYDROCORTISONE ACETATE 25 MG RE SUPP
25.0000 mg | Freq: Two times a day (BID) | RECTAL | Status: DC
  Administered 2022-12-02: 25 mg via RECTAL
  Filled 2022-12-02 (×2): qty 1

## 2022-12-02 MED ORDER — POTASSIUM CHLORIDE 10 MEQ/100ML IV SOLN
10.0000 meq | INTRAVENOUS | Status: AC
  Administered 2022-12-02 (×4): 10 meq via INTRAVENOUS
  Filled 2022-12-02 (×4): qty 100

## 2022-12-02 MED ORDER — DEXTROSE-SODIUM CHLORIDE 5-0.9 % IV SOLN: INTRAVENOUS | Status: DC

## 2022-12-02 MED ORDER — POTASSIUM CHLORIDE CRYS ER 20 MEQ PO TBCR
40.0000 meq | EXTENDED_RELEASE_TABLET | Freq: Once | ORAL | Status: AC
  Administered 2022-12-02: 40 meq via ORAL
  Filled 2022-12-02: qty 2

## 2022-12-02 MED ORDER — DEXTROSE 50 % IV SOLN
12.5000 g | INTRAVENOUS | Status: AC
  Administered 2022-12-02: 12.5 g via INTRAVENOUS
  Filled 2022-12-02: qty 50

## 2022-12-02 MED ORDER — POTASSIUM CHLORIDE CRYS ER 20 MEQ PO TBCR
20.0000 meq | EXTENDED_RELEASE_TABLET | Freq: Every day | ORAL | Status: DC
  Filled 2022-12-02: qty 1

## 2022-12-02 NOTE — Consult Note (Signed)
Referring Provider: TH Primary Care Physician:  Lorenda Ishihara, MD Primary Gastroenterologist:  Deboraha Sprang PCP  Reason for Consultation: GI bleed  HPI: Lindsey Mcguire is a 88 y.o. female with past medical history of dementia, chronic kidney disease stage IV, chronic anemia, recent admission for UTI with sepsis currently admitted to the hospital on November 27, 2022 with diarrhea and blood in the stool along with stercoral colitis.  GI is consulted for further evaluation. CT abdomen pelvis without contrast on admission showed dilated rectum with wall thickening and surrounding inflammatory stranding concerning for stercoral colitis/proctitis with fecal impaction.  Patient's hemoglobin was normal prior to admission.  Hemoglobin has dropped to 10.2 today.  Normal INR.  Normal LFTs.  Mild elevated creatinine which has improved since admission.  Fecal disimpaction was done by ED provider on admission.  Patient seen and examined at bedside.  She is not able to provide any history.  Past Medical History:  Diagnosis Date   Anemia 10/28/2003   Dementia (HCC)    Diabetes mellitus    Hypertension     Past Surgical History:  Procedure Laterality Date   CATARACT EXTRACTION  12/06/2003   left eye   TUBAL LIGATION  1977   YAG LASER APPLICATION Right 11/09/2013   Procedure: YAG LASER APPLICATION;  Surgeon: Loraine Leriche T. Nile Riggs, MD;  Location: [facility];  Service: Ophthalmology;  Laterality: Right;    Prior to Admission medications   Medication Sig Start Date End Date Taking? Authorizing Provider  atorvastatin (LIPITOR) 40 MG tablet Take 40 mg by mouth daily.   Yes [provider]  calcium carbonate (OS-CAL) 600 MG TABS tablet Take 600 mg by mouth daily.   Yes [provider]  Cholecalciferol (VITAMIN D-3 PO) Take 1 tablet by mouth daily.   Yes [provider]  ferrous sulfate 325 (65 FE) MG tablet Take 325 mg by mouth daily with breakfast.   Yes [provider]   levothyroxine (SYNTHROID) 88 MCG tablet Take 88 mcg by mouth daily.   Yes [provider]  Multiple Vitamins-Minerals (CENTRUM SILVER WOMEN 50+) TABS Take 1 tablet by mouth daily.   Yes [provider]    Scheduled Meds:  acidophilus  2 capsule Oral TID   atorvastatin  40 mg Oral Daily   cefadroxil  500 mg Oral QHS   Chlorhexidine Gluconate Cloth  6 each Topical Daily   feeding supplement  237 mL Oral BID BM   Gerhardt's butt cream   Topical BID   levothyroxine  88 mcg Oral Daily   mouth rinse  15 mL Mouth Rinse 4 times per day   pantoprazole  40 mg Oral Daily   polyethylene glycol  17 g Oral BID   potassium chloride  20 mEq Oral Daily   Continuous Infusions:  sodium chloride     lactated ringers 75 mL/hr at 12/01/22 2145   PRN Meds:.acetaminophen **OR** acetaminophen, fentaNYL (SUBLIMAZE) injection, ondansetron **OR** ondansetron (ZOFRAN) IV, mouth rinse, oxyCODONE  Allergies as of 11/27/2022 - Review Complete 11/27/2022  Allergen Reaction Noted   Glimepiride  08/09/2019   Rosiglitazone  08/09/2019    Family History  Problem Relation Age of Onset   Diabetes Mother    Hypertension Mother    Cancer Brother    Diabetes Brother    Hypertension Maternal Grandmother    Diabetes Maternal Grandmother    Cancer Brother    Diabetes Brother     Social History   Socioeconomic History   Marital status: Married  Spouse name: Not on file   Number of children: Not on file   Years of education: Not on file   Highest education level: Not on file  Occupational History   Not on file  Tobacco Use   Smoking status: Never   Smokeless tobacco: Never  Vaping Use   Vaping status: Never Used  Substance and Sexual Activity   Alcohol use: No   Drug use: No   Sexual activity: Not Currently    Partners: Male  Other Topics Concern   Not on file  Social History Narrative   Not on file   Social Determinants of Health   Financial Resource Strain: Not on file   Food Insecurity: Patient Unable To Answer (11/28/2022)   Hunger Vital Sign    Worried About Running Out of Food in the Last Year: Patient unable to answer    Ran Out of Food in the Last Year: Patient unable to answer  Transportation Needs: No Transportation Needs (11/28/2022)   PRAPARE - Administrator, Civil Service (Medical): No    Lack of Transportation (Non-Medical): No  Physical Activity: Not on file  Stress: Not on file  Social Connections: Not on file  Intimate Partner Violence: Patient Unable To Answer (11/28/2022)   Humiliation, Afraid, Rape, and Kick questionnaire    Fear of Current or Ex-Partner: Patient unable to answer    Emotionally Abused: Patient unable to answer    Physically Abused: Patient unable to answer    Sexually Abused: Patient unable to answer    Review of Systems: All negative except as stated above in HPI.  Physical Exam: Vital signs: Vitals:   12/02/22 0459 12/02/22 0745  BP: (!) 149/117 137/83  Pulse: 74 71  Resp: 18 20  Temp: 98.6 F (37 C) (!) 97.5 F (36.4 C)  SpO2: 100%    Last BM Date : 12/01/22 General: Elderly patient, demented, not able to provide any history Lungs: No visible respiratory distress Heart:  Regular rate and rhythm; no murmurs, clicks, rubs,  or gallops. Abdomen: Soft, nontender, nondistended, bowel sounds present, no peritoneal signs  Rectal:  Deferred  GI:  Lab Results: Recent Labs    12/01/22 0421 12/01/22 1906 12/02/22 0506  WBC 4.9 5.1 4.4  HGB 11.2* 10.5* 10.2*  HCT 34.3* 32.8* 31.8*  PLT 254 258 235   BMET Recent Labs    11/30/22 0246 12/01/22 0421 12/02/22 0506  NA 136 138 138  K 3.8 3.3* 3.0*  CL 106 105 103  CO2 23 23 24   GLUCOSE 172* 138* 90  BUN 21 15 11   CREATININE 1.32* 1.21* 1.09*  CALCIUM 8.6* 8.1* 7.5*   LFT Recent Labs    12/02/22 0506  ALBUMIN 2.5*   PT/INR No results for input(s): "LABPROT", "INR" in the last 72 hours.   Studies/Results: VAS [place] UPPER EXTREMITY  VENOUS DUPLEX  Result Date: 12/01/2022 UPPER VENOUS STUDY  Patient Name:  Lindsey Mcguire  Date of Exam:   12/01/2022 Medical Rec #: [id number]          Accession #:    [id number] Date of Birth: 21-May-1934          Patient Gender: F Patient Age:   88 years Exam Location:  [facility] Procedure:      VAS [place] UPPER EXTREMITY VENOUS DUPLEX Referring Phys: A POWELL JR --------------------------------------------------------------------------------  Indications: Swelling Limitations: Patient unable to cooperate due to altered status (combative). Comparison Study: No previous exams Performing Technologist:  Jody Hill RVT, RDMS  Examination Guidelines: A complete evaluation includes B-mode imaging, spectral Doppler, color Doppler, and power Doppler as needed of all accessible portions of each vessel. Bilateral testing is considered an integral part of a complete examination. Limited examinations for reoccurring indications may be performed as noted.  Right Findings: +----------+------------+---------+-----------+----------+--------------+ RIGHT     CompressiblePhasicitySpontaneousProperties   Summary     +----------+------------+---------+-----------+----------+--------------+ IJV           Full       Yes       Yes                             +----------+------------+---------+-----------+----------+--------------+ Subclavian    Full       Yes       Yes                             +----------+------------+---------+-----------+----------+--------------+ Axillary      Full       Yes       Yes                             +----------+------------+---------+-----------+----------+--------------+ Brachial      Full       Yes       Yes                             +----------+------------+---------+-----------+----------+--------------+ Radial                                              Not visualized +----------+------------+---------+-----------+----------+--------------+ Ulnar                                                Not visualized +----------+------------+---------+-----------+----------+--------------+ Cephalic                                            Not visualized +----------+------------+---------+-----------+----------+--------------+ Basilic       Full       Yes       Yes                             +----------+------------+---------+-----------+----------+--------------+ Patient combative - unable to visualize radial or ulnar veins  Left Findings: +----------+------------+---------+-----------+----------+-------+ LEFT      CompressiblePhasicitySpontaneousPropertiesSummary +----------+------------+---------+-----------+----------+-------+ Subclavian    Full       Yes       Yes                      +----------+------------+---------+-----------+----------+-------+  Summary:  Right: No evidence of deep vein thrombosis in the upper extremity. No evidence of superficial vein thrombosis in the upper extremity. However, unable to visualize the ulnar and radial veins.  Left: No evidence of thrombosis in the subclavian.  *See table(s) above for measurements and observations.     Preliminary     Impression/Plan: -Diarrhea with rectal bleeding.  Not able to  obtain any history from the patient. -Acute blood loss anemia.  Acute on chronic anemia.  Hemoglobin was around 10.3 in August 2022.  Normal hemoglobin on admission likely from hemoconcentration. -Constipation with fecal impaction.  Fecal disimpaction was performed in the ED.  Recommendations ------------------------- -Patient's hemoglobin is relatively at baseline compared to last 2 years. -Recommend Anusol suppository twice a day for likely stercoral colitis -Avoid constipation with MiraLAX twice a day.  Hold for diarrhea. -Use fleets enema as needed.  No further inpatient GI workup planned.  GI will sign off.  Call us back if needed.    LOS: 4 days   Kathi Der  MD,  FACP 12/02/2022, 9:17 AM  Contact #  [phone]

## 2022-12-02 NOTE — Progress Notes (Signed)
Patient is bleeding from her rectum with large blood clot. Picture uploaded on her chart. MD aware. No new order received at this time.

## 2022-12-02 NOTE — TOC Progression Note (Signed)
Transition of Care [facility]) - Progression Note    Patient Details  Name: Lindsey Mcguire MRN: [id number] Date of Birth: 04/04/1935  [facility] of [facility] CM/SW Contact   A [place], [place] Phone Number: 12/02/2022, 4:50 PM  Clinical Narrative:     CSW met with pt at bedside. CSW called pt's name several times and did not respond. Due to disorientation, CSW reached out to pt's family member, Romelda Kerper. There was no answer, CSW left VM.   CSW will follow up at another time regarding disposition for SNF.   TOC will continue to follow.     Barriers to Discharge: Continued Medical Work up  Expected Discharge Plan and Services In-house Referral: Hospice / Palliative Care     Living arrangements for the past 2 months: Single Family Home                                       Social Determinants of Health (SDOH) Interventions SDOH Screenings   Food Insecurity: Patient Unable To Answer (11/28/2022)  Housing: High Risk (11/28/2022)  Transportation Needs: No Transportation Needs (11/28/2022)  Utilities: Patient Unable To Answer (11/28/2022)  Tobacco Use: Low Risk  (11/27/2022)  Health Literacy: High Risk (10/21/2022)   Received from [facility]    Readmission Risk Interventions     No data to display

## 2022-12-02 NOTE — Progress Notes (Signed)
Daily Progress Note   Patient Name: Lindsey Mcguire       Date: 12/02/2022 DOB: 06-02-1934  Age: 88 y.o. MRN#: [id number] Attending Physician: Lindsey Boast, MD Primary Care Physician: Lindsey Ishihara, MD Admit Date: 11/27/2022  Reason for Consultation/Follow-up: Establishing goals of care  Patient Profile/HPI:  88 y.o. female with past medical history of dementia, HTN, HLD, HFpEF, hypothyroidism, diabetes, anemia, CKD stage IV, osteoporosis, recent hospitalization for UTI/sepsis 6/22-10/28/22 followed by rehab stay admitted on 11/27/2022 with diarrhea and blood in stool with stercoral colitis with disimpaction in ED. Being treated for UTI. No longer requiring vasopressor support. No longer in the ICU.   Subjective: Chart reviewed including labs, progress notes, imaging from this and previous encounters.  Lindsey Mcguire is awake and alert. Tells me her name, but doesn't answer other questions.  Noted she had some continued rectal bleeding overnight. Cr improved, Hgb trend down slightly- not requiring transfusion.     Physical Exam Vitals and nursing note reviewed.  Constitutional:      Comments: frail  Pulmonary:     Effort: Pulmonary effort is normal.  Neurological:     Mental Status: She is alert.     Comments: Oriented x 1  Psychiatric:     Comments: pleasant             Vital Signs: BP 137/83 (BP Location: Right Arm)   Pulse 71   Temp (!) 97.5 F (36.4 C) (Oral)   Resp 20   Ht 5' (1.524 m)   Wt 43.7 kg   SpO2 100%   BMI 18.82 kg/m  SpO2: SpO2: 100 % O2 Device: O2 Device: Room Air O2 Flow Rate:    Intake/output summary:  Intake/Output Summary (Last 24 hours) at 12/02/2022 1016 Last data filed at 12/02/2022 0919 Gross per 24 hour  Intake 1598.23 ml  Output 500 ml  Net 1098.23  ml   LBM: Last BM Date : 12/01/22 Baseline Weight: Weight: 66.6 kg Most recent weight: Weight: 43.7 kg       Palliative Assessment/Data: PPS: 40%      Patient Active Problem List   Diagnosis Date Noted   Stercoral colitis 11/28/2022   Hypotension 11/28/2022   (HFpEF) heart failure with preserved ejection fraction (HCC) 11/28/2022   Hypothyroidism 11/28/2022   Hyperlipidemia 11/28/2022   Fecal impaction (  HCC) 11/28/2022   Chronic constipation 11/28/2022   Chronic kidney disease (CKD), stage IV (severe) (HCC) 11/28/2022   Hyperglycemia 11/28/2022   UTI (urinary tract infection) 11/28/2022   Failure to thrive in adult 11/28/2022   Hypertension    Dementia (HCC)    Acute CHF (congestive heart failure) (HCC) 02/07/2019   CHF (congestive heart failure) (HCC) 02/06/2019   ICH (intracerebral hemorrhage) (HCC) 02/23/2017   Intracranial hemorrhage (HCC) 02/23/2017   Type 2 diabetes mellitus with stage 4 chronic kidney disease, without long-term current use of insulin (HCC) 09/24/2016   Anemia due to chronic renal failure treated with erythropoietin 02/16/2011    Palliative Care Assessment & Plan    Assessment/Recommendations/Plan  Continue current plan of care GOC currently are for pt to return to rehab Awaiting return call from patient's son- has been called by PMT x 2 in last 24 hours- he was to discuss future GOC with family and then call PMT back- she is currently DNR- noted family in process of burying patient's spouse so GOC discussion may be delayed- recommend outpatient Palliative for followup  Code Status: DNR  Prognosis:  Unable to determine  Discharge Planning: To Be Determined  Thank you for allowing the Palliative Medicine Team to assist in the care of this patient.  Total time: 45 mins    Greater than 50%  of this time was spent counseling and coordinating care related to the above assessment and plan.  Lindsey Mcguire, AGNP-C Palliative  Medicine   Please contact Palliative Medicine Team phone at [phone] for questions and concerns.

## 2022-12-02 NOTE — Progress Notes (Signed)
PROGRESS NOTE Lindsey Mcguire  GNF:[id number] DOB: January 29, 1935 DOA: 11/27/2022 PCP: Lorenda Ishihara, MD  Brief Narrative/Hospital Course: [phone] with hypertension, hyperlipidemia, hypothyroidism, dementia, HFpEF, type 2 diabetes mellitus, anemia, stage IV CKD, osteoporosis who had recently been discharged on 10/28/2022 from [facility] after being treated for dehydration, deconditioning, failure to thrive, weakness, sepsis from cystitis and was subsequently discharged to SNF and remained in therapy for 2 weeks subsequently discharged home.  Her husband recently passed away.  She lives with her son and grandson.  Family reported that she has been having diarrhea for the last several days and noticed some blood in her stool.  They brought her to the ED for further evaluation. Noted to be extremely debilitated,cachectic and had severe fecal impaction with a large stool ball which required disimpaction in the ED and stercoral colitis noted. She was also noted to be hypotensive and dehydrated and eventually required low-dose norepinephrine infusion to maintain blood pressure. She was started on antibiotic therapy and admission requested for further management. Patient was treated for sepsis due to UTI and stercoral colitis, transferred out of ICU 8/4. 8/4-noticed some blood per rectum heparin DVT prophylaxis discontinued CBC ordered and GI was consulted    Subjective: Patient seen and examined Overnight patient afebrile, BP stable, not hypoxic on room air Labs this morning showed potassium 3.0 creatinine downtrending 1.0 11 low 2.5 stable CBC with chronic anemia She is alert awake oriented to self only pleasantly confused, no complaints.  Responds with "yes" for all the questions  Assessment and Plan: Principal Problem:   Stercoral colitis Active Problems:   Failure to thrive in adult   Type 2 diabetes mellitus with stage 4 chronic kidney disease, without long-term current use of insulin  (HCC)   Hypotension   (HFpEF) heart failure with preserved ejection fraction (HCC)   Hypertension   Dementia (HCC)   Hypothyroidism   Hyperlipidemia   Fecal impaction (HCC)   Chronic constipation   Chronic kidney disease (CKD), stage IV (severe) (HCC)   Hyperglycemia   UTI (urinary tract infection)    Rectal bleeding: Hemoglobin has overall been overall stable.  Holding heparin, GI has been consulted> input noted recommending Anusol suppository twice a day for likely stercoral colitis avoid constipation with MiraLAX twice daily hold for diarrhea, use Fleet enema as needed no further inpatient GI workup planned.  Hypokalemia will replace  Recent Labs  Lab 11/27/22 1836 11/27/22 1858 11/29/22 1144 11/30/22 0246 12/01/22 0421 12/02/22 0506  K 4.1 4.3 3.5 3.8 3.3* 3.0*  CALCIUM 9.2  --  8.7* 8.6* 8.1* 7.5*  MG 2.2  --  1.5* 1.6*  --   --   PHOS  --   --  2.0* 1.7* 2.8 3.3    No results found for: "PTH"  Sepsis due to UTI and Stercoral Colitis: Currently hemodynamically stable. CT with dilated and stool filled rectum with wall thickening and surrounding inflammatory stranding (concerning for stercoral colitis/proctitis with fecal impaction) >S/p disimpaction by pccm. Complete antibiotics x 7 days. Unfortunately no urine or blood culture data   Acute Metabolic Encephalopathy: History of dementia: Pleasantly confused  aaox1, at baseline, continue delirium precaution fall precaution PT OT.  AKI on CKD IIIB: Creatinine has improved from peak of 1.9, monitor avoid nephrotoxic medication Recent Labs    11/27/22 1836 11/27/22 1858 11/29/22 1144 11/30/22 0246 12/01/22 0421 12/02/22 0506  BUN 55* 58* 26* 21 15 11   CREATININE 1.83* 1.90* 1.35* 1.32* 1.21* 1.09*  CO2 23  --  24 23 23 24     Failure to thrive Severe protein calorie malnutrition Debility deconditioning: Continue to augment nutritional status, palliative care has been following  Hypothyroidism Cont  synthroid  Anemia of chronic disease with chronic iron deficiency. Hemoglobin remained stable overall. Recent Labs    11/29/22 1144 11/30/22 0246 12/01/22 0421 12/01/22 1906 12/02/22 0506  HGB 11.3* 11.6* 11.2* 10.5* 10.2*  MCV 86.3 87.9 83.9 84.8 84.4   Goals of care: Palliative care has been following closely, DNR PMT following up with patient's son to discuss future GOC, will need close outpatient follow-up with palliative care.  DVT prophylaxis: Place and maintain sequential compression device Start: 12/01/22 1809 SCDs Start: 11/28/22 0734 Code Status:   Code Status: DNR Family Communication: plan of care discussed with patient/ at bedside. Patient status is: Inpatient because of sepsis Level of care: Med-Surg   Dispo: The patient is from:Home            Anticipated disposition: TBD. Ptot eval  Objective: Vitals last 24 hrs: Vitals:   12/01/22 1638 12/01/22 2050 12/02/22 0459 12/02/22 0745  BP: (!) 145/88 117/68 (!) 149/117 137/83  Pulse: 79 81 74 71  Resp: 18 19 18 20   Temp: (!) 97.5 F (36.4 C) (!) 97.3 F (36.3 C) 98.6 F (37 C) (!) 97.5 F (36.4 C)  TempSrc:    Oral  SpO2: 100% 100% 100%   Weight:      Height:       Weight change:   Physical Examination: General exam: alert awake, older than stated age HEENT:Oral mucosa moist, Ear/Nose WNL grossly Respiratory system: bilaterally clear BS, no use of accessory muscle Cardiovascular system: S1 & S2 +, No JVD. Gastrointestinal system: Abdomen soft,NT,ND, BS+ Nervous System:Alert, awake, oriented x1 Extremities: LE edema neg,distal peripheral pulses palpable.  Skin: No rashes,no icterus. MSK: Normal muscle bulk,tone, power  Medications reviewed:  Scheduled Meds:  acidophilus  2 capsule Oral TID   atorvastatin  40 mg Oral Daily   cefadroxil  500 mg Oral QHS   Chlorhexidine Gluconate Cloth  6 each Topical Daily   feeding supplement  237 mL Oral BID BM   Gerhardt's butt cream   Topical BID    hydrocortisone  25 mg Rectal BID   levothyroxine  88 mcg Oral Daily   mouth rinse  15 mL Mouth Rinse 4 times per day   pantoprazole  40 mg Oral Daily   polyethylene glycol  17 g Oral BID   potassium chloride  20 mEq Oral Daily  Continuous Infusions:  sodium chloride     lactated ringers 75 mL/hr at 12/01/22 2145   Diet Order             Diet regular Room service appropriate? Yes; Fluid consistency: Thin  Diet effective now                  Intake/Output Summary (Last 24 hours) at 12/02/2022 1036 Last data filed at 12/02/2022 0919 Gross per 24 hour  Intake 1598.23 ml  Output 500 ml  Net 1098.23 ml   Net IO Since Admission: 7,479.45 mL [12/02/22 1036]  Wt Readings from Last 3 Encounters:  11/28/22 43.7 kg  11/27/20 66.6 kg  05/29/20 68.1 kg     Unresulted Labs (From admission, onward)     Start     Ordered   12/02/22 0500  Basic metabolic panel  Daily,   R     Question:  Specimen collection method  Answer:  Lab=Lab  collect   12/01/22 0853   12/01/22 1934  Occult blood card to lab, stool  Once,   R        12/01/22 1933   11/29/22 0500  Renal function panel  Daily,   R      11/28/22 0736          Data Reviewed: I have personally reviewed following labs and imaging studies CBC: Recent Labs  Lab 11/27/22 1836 11/27/22 1858 11/29/22 1144 11/30/22 0246 12/01/22 0421 12/01/22 1906 12/02/22 0506  WBC 8.7  --  5.7 6.7 4.9 5.1 4.4  NEUTROABS 7.1  --   --   --   --   --   --   HGB 12.6   < > 11.3* 11.6* 11.2* 10.5* 10.2*  HCT 39.9   < > 34.6* 36.3 34.3* 32.8* 31.8*  MCV 86.2  --  86.3 87.9 83.9 84.8 84.4  PLT 320  --  252 245 254 258 235   < > = values in this interval not displayed.  Basic Metabolic Panel: Recent Labs  Lab 11/27/22 1836 11/27/22 1858 11/29/22 1144 11/30/22 0246 12/01/22 0421 12/02/22 0506  NA 134* 137 138 136 138 138  K 4.1 4.3 3.5 3.8 3.3* 3.0*  CL 100 103 105 106 105 103  CO2 23  --  24 23 23 24   GLUCOSE 243* 232* 127* 172* 138* 90   BUN 55* 58* 26* 21 15 11   CREATININE 1.83* 1.90* 1.35* 1.32* 1.21* 1.09*  CALCIUM 9.2  --  8.7* 8.6* 8.1* 7.5*  MG 2.2  --  1.5* 1.6*  --   --   PHOS  --   --  2.0* 1.7* 2.8 3.3  GFR: Estimated Creatinine Clearance: 25.6 mL/min (A) (by C-G formula based on SCr of 1.09 mg/dL (H)). Liver Function Tests: Recent Labs  Lab 11/27/22 1836 11/29/22 1144 11/30/22 0246 12/01/22 0421 12/02/22 0506  AST 29  --   --   --   --   ALT 19  --   --   --   --   ALKPHOS 107  --   --   --   --   BILITOT 0.8  --   --   --   --   PROT 8.5*  --   --   --   --   ALBUMIN 3.7 2.9* 2.7* 2.8* 2.5*   Recent Labs  Lab 11/27/22 1836  LIPASE 32  No results for input(s): "AMMONIA" in the last 168 hours. Coagulation Profile: Recent Labs  Lab 11/27/22 1836  INR 1.1   Recent Labs  Lab 12/01/22 1252 12/01/22 1656 12/01/22 2053 12/02/22 0500 12/02/22 0855  GLUCAP 69* 78 97 94 76   Recent Labs  Lab 11/27/22 1836 11/27/22 2001 11/28/22 1316  LATICACIDVEN 1.1 1.3 1.3    Recent Results (from the past 240 hour(s))  MRSA Next Gen by PCR, Nasal     Status: None   Collection Time: 11/28/22  8:51 AM   Specimen: Nasal Mucosa; Nasal Swab  Result Value Ref Range Status   MRSA by PCR Next Gen NOT DETECTED NOT DETECTED Final    Comment: (NOTE) The GeneXpert MRSA Assay (FDA approved for NASAL specimens only), is one component of a comprehensive MRSA colonization surveillance program. It is not intended to diagnose MRSA infection nor to guide or monitor treatment for MRSA infections. Test performance is not FDA approved in patients less than 49 years old. Performed at [facility], [phone]  [address] [place], [place] [address]   MRSA Next Gen by PCR, Nasal     Status: None   Collection Time: 11/28/22  3:09 PM   Specimen: Nasal Mucosa; Nasal Swab  Result Value Ref Range Status   MRSA by PCR Next Gen NOT DETECTED NOT DETECTED Final    Comment: (NOTE) The GeneXpert MRSA Assay (FDA approved for NASAL  specimens only), is one component of a comprehensive MRSA colonization surveillance program. It is not intended to diagnose MRSA infection nor to guide or monitor treatment for MRSA infections. Test performance is not FDA approved in patients less than 31 years old. Performed at [facility], [address], [place], [place] [address]   Antimicrobials: Anti-infectives (From admission, onward)    Start     Dose/Rate Route Frequency Ordered Stop   11/30/22 0838  cefadroxil (DURICEF) capsule 500 mg        500 mg Oral Daily at bedtime 11/30/22 0826 12/04/22 2159   11/29/22 2200  cefTRIAXone (ROCEPHIN) 1 g in sodium chloride 0.9 % 100 mL IVPB  Status:  Discontinued        1 g 200 mL/hr over 30 Minutes Intravenous Every 24 hours 11/29/22 1034 11/30/22 0843   11/28/22 2200  cefTRIAXone (ROCEPHIN) 1 g in sodium chloride 0.9 % 100 mL IVPB  Status:  Discontinued        1 g 200 mL/hr over 30 Minutes Intravenous Every 24 hours 11/28/22 0731 11/28/22 0931   11/28/22 2200  cefTRIAXone (ROCEPHIN) 2 g in sodium chloride 0.9 % 100 mL IVPB  Status:  Discontinued        2 g 200 mL/hr over 30 Minutes Intravenous Every 24 hours 11/28/22 0931 11/29/22 1034   11/28/22 1000  metroNIDAZOLE (FLAGYL) IVPB 500 mg        500 mg 100 mL/hr over 60 Minutes Intravenous Every 12 hours 11/28/22 0731 11/30/22 2205   11/27/22 2300  cefTRIAXone (ROCEPHIN) 1 g in sodium chloride 0.9 % 100 mL IVPB        1 g 200 mL/hr over 30 Minutes Intravenous  Once 11/27/22 2251 11/28/22 0240   11/27/22 2300  metroNIDAZOLE (FLAGYL) IVPB 500 mg        500 mg 100 mL/hr over 60 Minutes Intravenous  Once 11/27/22 2251 11/28/22 0157     Culture/Microbiology    Component Value Date/Time   SDES ASCITIC 02/08/2019 1034   SPECREQUEST BOTTLES DRAWN AEROBIC AND ANAEROBIC 10CC 02/08/2019 1034   CULT  02/08/2019 1034    NO GROWTH 5 DAYS Performed at [facility], [address], [place], [place] [address]    REPTSTATUS 02/13/2019  FINAL 02/08/2019 1034  Other culture-see note  Radiology Studies: VAS [place] UPPER EXTREMITY VENOUS DUPLEX  Result Date: 12/01/2022 UPPER VENOUS STUDY  Patient Name:  KARTHIKA MOLYNEUX  Date of Exam:   12/01/2022 Medical Rec #: [id number]          Accession #:    [id number] Date of Birth: 23-Dec-1934          Patient Gender: F Patient Age:   88 years Exam Location:  [facility] Procedure:      VAS [place] UPPER EXTREMITY VENOUS DUPLEX Referring Phys: A POWELL JR --------------------------------------------------------------------------------  Indications: Swelling Limitations: Patient unable to cooperate due to altered status (combative). Comparison Study: No previous exams Performing Technologist: Jody Hill RVT, RDMS  Examination Guidelines: A complete evaluation includes B-mode imaging, spectral Doppler, color Doppler, and power Doppler as needed  of all accessible portions of each vessel. Bilateral testing is considered an integral part of a complete examination. Limited examinations for reoccurring indications may be performed as noted.  Right Findings: +----------+------------+---------+-----------+----------+--------------+ RIGHT     CompressiblePhasicitySpontaneousProperties   Summary     +----------+------------+---------+-----------+----------+--------------+ IJV           Full       Yes       Yes                             +----------+------------+---------+-----------+----------+--------------+ Subclavian    Full       Yes       Yes                             +----------+------------+---------+-----------+----------+--------------+ Axillary      Full       Yes       Yes                             +----------+------------+---------+-----------+----------+--------------+ Brachial      Full       Yes       Yes                             +----------+------------+---------+-----------+----------+--------------+ Radial                                              Not  visualized +----------+------------+---------+-----------+----------+--------------+ Ulnar                                               Not visualized +----------+------------+---------+-----------+----------+--------------+ Cephalic                                            Not visualized +----------+------------+---------+-----------+----------+--------------+ Basilic       Full       Yes       Yes                             +----------+------------+---------+-----------+----------+--------------+ Patient combative - unable to visualize radial or ulnar veins  Left Findings: +----------+------------+---------+-----------+----------+-------+ LEFT      CompressiblePhasicitySpontaneousPropertiesSummary +----------+------------+---------+-----------+----------+-------+ Subclavian    Full       Yes       Yes                      +----------+------------+---------+-----------+----------+-------+  Summary:  Right: No evidence of deep vein thrombosis in the upper extremity. No evidence of superficial vein thrombosis in the upper extremity. However, unable to visualize the ulnar and radial veins.  Left: No evidence of thrombosis in the subclavian.  *See table(s) above for measurements and observations.     Preliminary    LOS: 4 days  Lanae Boast, MD [facility]  12/02/2022, 10:36 AM

## 2022-12-02 NOTE — [place] FL2 (Signed)
[place] MEDICAID FL2 LEVEL OF CARE FORM     IDENTIFICATION  Patient Name: Lindsey Mcguire Birthdate: 1935/02/05 Sex: female Admission Date (Current Location): 11/27/2022  [facility] and [place] Number:  [employer] and Address:  [facility], [address] [place], [place] [address]      Provider Number: [id number]  Attending Physician Name and Address:  Lanae Boast, MD  Relative Name and Phone Number:  Safiyya Kosmalski, son,   [phone]    Current Level of Care: Hospital Recommended Level of Care: Skilled Nursing Facility Prior Approval Number:    Date Approved/Denied:   PASRR Number: [id number]  Discharge Plan: SNF    Current Diagnoses: Patient Active Problem List   Diagnosis Date Noted   Stercoral colitis 11/28/2022   Hypotension 11/28/2022   (HFpEF) heart failure with preserved ejection fraction (HCC) 11/28/2022   Hypothyroidism 11/28/2022   Hyperlipidemia 11/28/2022   Fecal impaction (HCC) 11/28/2022   Chronic constipation 11/28/2022   Chronic kidney disease (CKD), stage IV (severe) (HCC) 11/28/2022   Hyperglycemia 11/28/2022   UTI (urinary tract infection) 11/28/2022   Failure to thrive in adult 11/28/2022   Hypertension    Dementia (HCC)    Acute CHF (congestive heart failure) (HCC) 02/07/2019   CHF (congestive heart failure) (HCC) 02/06/2019   ICH (intracerebral hemorrhage) (HCC) 02/23/2017   Intracranial hemorrhage (HCC) 02/23/2017   Type 2 diabetes mellitus with stage 4 chronic kidney disease, without long-term current use of insulin (HCC) 09/24/2016   Anemia due to chronic renal failure treated with erythropoietin 02/16/2011    Orientation RESPIRATION BLADDER Height & Weight     Self  Normal Incontinent Weight: 96 lb 5.5 oz (43.7 kg) Height:  5' (152.4 cm)  BEHAVIORAL SYMPTOMS/MOOD NEUROLOGICAL BOWEL NUTRITION STATUS      Incontinent Diet (see discharge summary)  AMBULATORY STATUS COMMUNICATION OF NEEDS Skin    Extensive Assist Verbally Other (Comment) (Partial thickness loss of dermis presenting as a shallow open ulcer with a red, pink wound bed without slough.  Sacrum Medial Stage II - Partial thickness loss of dermis presenting as a shallow open ulcer with a red, pink wound bed without slough)                       Personal Care Assistance Level of Assistance  Bathing, Feeding, Dressing Bathing Assistance: Limited assistance Feeding assistance: Limited assistance Dressing Assistance: Limited assistance     Functional Limitations Info  Sight, Hearing, Speech Sight Info: Impaired Hearing Info: Adequate Speech Info: Adequate    SPECIAL CARE FACTORS FREQUENCY  PT (By licensed PT), OT (By licensed OT)     PT Frequency: 5x/week OT Frequency: 5x/week            Contractures Contractures Info: Not present    Additional Factors Info  Code Status, Allergies Code Status Info: DNR Allergies Info: Amaryl (Glimepiride)  Avandia (Rosiglitazone)           Current Medications (12/02/2022):  This is the current hospital active medication list Current Facility-Administered Medications  Medication Dose Route Frequency Provider Last Rate Last Admin   0.9 %  sodium chloride infusion  250 mL Intravenous Continuous Johnson, Clanford L, MD       acetaminophen (TYLENOL) tablet 650 mg  650 mg Oral Q6H PRN Johnson, Clanford L, MD   650 mg at 12/02/22 0210   Or   acetaminophen (TYLENOL) suppository 650 mg  650 mg Rectal Q6H PRN Laural Benes,  Clanford L, MD       acidophilus (RISAQUAD) capsule 2 capsule  2 capsule Oral TID Nevin Bloodgood A, MD   2 capsule at 12/02/22 1618   atorvastatin (LIPITOR) tablet 40 mg  40 mg Oral Daily Marrianne Mood, MD   40 mg at 12/02/22 8295   cefadroxil (DURICEF) capsule 500 mg  500 mg Oral QHS Zigmund Daniel., MD   500 mg at 12/02/22 0207   Chlorhexidine Gluconate Cloth 2 % PADS 6 each  6 each Topical Daily Lorin Glass, MD   6 each at 12/02/22 0910    dextrose 5 %-0.9 % sodium chloride infusion   Intravenous Continuous Lanae Boast, MD 75 mL/hr at 12/02/22 1616 New Bag at 12/02/22 1616   feeding supplement (ENSURE ENLIVE / ENSURE PLUS) liquid 237 mL  237 mL Oral BID BM Johnson, Clanford L, MD   237 mL at 12/02/22 6213   Gerhardt's butt cream   Topical BID Lorin Glass, MD   Given at 12/02/22 0865   hydrocortisone (CORTENEMA) enema 100 mg  100 mg Rectal QHS Brahmbhatt, Parag, MD       lactated ringers infusion   Intravenous Continuous Kendell Bane, MD   Stopped at 12/02/22 1603   levothyroxine (SYNTHROID) tablet 88 mcg  88 mcg Oral Daily Marrianne Mood, MD   88 mcg at 12/02/22 0601   ondansetron (ZOFRAN) tablet 4 mg  4 mg Oral Q6H PRN Johnson, Clanford L, MD       Or   ondansetron (ZOFRAN) injection 4 mg  4 mg Intravenous Q6H PRN Johnson, Clanford L, MD       Oral care mouth rinse  15 mL Mouth Rinse 4 times per day Virl Diamond A, MD   15 mL at 12/02/22 1514   Oral care mouth rinse  15 mL Mouth Rinse PRN Olalere, Adewale A, MD       oxyCODONE (Oxy IR/ROXICODONE) immediate release tablet 5 mg  5 mg Oral Q6H PRN Johnson, Clanford L, MD       pantoprazole (PROTONIX) EC tablet 40 mg  40 mg Oral Daily Shahmehdi, Seyed A, MD   40 mg at 12/02/22 0910   polyethylene glycol (MIRALAX / GLYCOLAX) packet 17 g  17 g Oral BID Johnson, Clanford L, MD   17 g at 12/02/22 0910   potassium chloride 10 mEq in 100 mL IVPB  10 mEq Intravenous Q1 Hr x 4 Kc, Ramesh, MD 100 mL/hr at 12/02/22 1514 10 mEq at 12/02/22 1514   Facility-Administered Medications Ordered in Other Encounters  Medication Dose Route Frequency Provider Last Rate Last Admin   epoetin alfa-epbx (RETACRIT) injection 20,000 Units  20,000 Units Subcutaneous Once Malachy Mood, MD         Discharge Medications: Please see discharge summary for a list of discharge medications.  Relevant Imaging Results:  Relevant Lab Results:   Additional Information SSN: [id number]   A [place],  [place]

## 2022-12-02 NOTE — Significant Event (Signed)
RN informed patient continues to bad rectal bleed. Discussed w- GI and Dr B has ordered Cortenema to see if it helps stops the rectal bleed, and does not feel bleeding sccan will be helpful given CT findings. Also hypoglycemic needing dextrose push, started in d5ns I called and updated her Son [name], no answer on Mike?s phone He informs she had been bleeding before admission/  Per discussion no escalation of care and continue to treat what is treatable, and he is okay with blood transfusion, but he is going to discuss with his aunt and let us know if otherwise. If she continues to decline they will be thinking about hospice He is aware that she continues to decline. H-h and type and screen ordered.

## 2022-12-02 NOTE — Care Management Important Message (Signed)
Important Message  Patient Details  Name: Lindsey Mcguire MRN: [id number] Date of Birth: 09/14/1934   Medicare Important Message Given:  Yes     Dorena Bodo 12/02/2022, 3:18 PM

## 2022-12-02 NOTE — Plan of Care (Signed)

## 2022-12-03 DIAGNOSIS — F02C0 Dementia in other diseases classified elsewhere, severe, without behavioral disturbance, psychotic disturbance, mood disturbance, and anxiety: Secondary | ICD-10-CM

## 2022-12-03 DIAGNOSIS — K5909 Other constipation: Secondary | ICD-10-CM | POA: Diagnosis not present

## 2022-12-03 DIAGNOSIS — Z7189 Other specified counseling: Secondary | ICD-10-CM | POA: Diagnosis not present

## 2022-12-03 DIAGNOSIS — G309 Alzheimer's disease, unspecified: Secondary | ICD-10-CM | POA: Diagnosis not present

## 2022-12-03 DIAGNOSIS — K5289 Other specified noninfective gastroenteritis and colitis: Secondary | ICD-10-CM | POA: Diagnosis not present

## 2022-12-03 LAB — GLUCOSE, CAPILLARY
Glucose-Capillary: 132 mg/dL — ABNORMAL HIGH (ref 70–99)
Glucose-Capillary: 160 mg/dL — ABNORMAL HIGH (ref 70–99)
Glucose-Capillary: 89 mg/dL (ref 70–99)
Glucose-Capillary: 94 mg/dL (ref 70–99)
Glucose-Capillary: 98 mg/dL (ref 70–99)

## 2022-12-03 NOTE — Plan of Care (Signed)

## 2022-12-03 NOTE — Progress Notes (Signed)
PROGRESS NOTE Lindsey BARRACLOUGH  DGL:[id number] DOB: 10-23-1934 DOA: 11/27/2022 PCP: Lorenda Ishihara, MD  Brief Narrative/Hospital Course: [phone] with hypertension, hyperlipidemia, hypothyroidism, dementia, HFpEF, type 2 diabetes mellitus, anemia, stage IV CKD, osteoporosis who had recently been discharged on 10/28/2022 from [facility] after being treated for dehydration, deconditioning, failure to thrive, weakness, sepsis from cystitis and was subsequently discharged to SNF and remained in therapy for 2 weeks subsequently discharged home.  Her husband recently passed away.  She lives with her son and grandson.  Family reported that she has been having diarrhea for the last several days and noticed some blood in her stool.  They brought her to the ED for further evaluation. Noted to be extremely debilitated,cachectic and had severe fecal impaction with a large stool ball which required disimpaction in the ED and stercoral colitis noted. She was also noted to be hypotensive and dehydrated and eventually required low-dose norepinephrine infusion to maintain blood pressure. She was started on antibiotic therapy and admission requested for further management. Patient was treated for sepsis due to UTI and stercoral colitis, transferred out of ICU 8/4. 8/4-noticed some blood per rectum heparin DVT prophylaxis discontinued CBC ordered and GI was consulted    Subjective: Patient seen and examined this morning Overnight afebrile hemodynamically stable  No more hypoglycemia > placed on soft diet  Hemoglobin remained stable in 9 to 10 g Rn reports no bleeding overnight Last night had BM and was not bloody RN able to give her meds.   Assessment and Plan: Principal Problem:   Stercoral colitis Active Problems:   Failure to thrive in adult   Type 2 diabetes mellitus with stage 4 chronic kidney disease, without long-term current use of insulin (HCC)   Hypotension   (HFpEF) heart failure with  preserved ejection fraction (HCC)   Hypertension   Dementia (HCC)   Hypothyroidism   Hyperlipidemia   Fecal impaction (HCC)   Chronic constipation   Chronic kidney disease (CKD), stage IV (severe) (HCC)   Hyperglycemia   UTI (urinary tract infection)   Rectal bleeding Stercoral colitis/proctitis with fecal impaction: Dilated and stool-filled rectum with wall thickening and surrounding inflammatory stranding on admission.s/p manual disimpaction by PCCM.Hemoglobin has overall been overall stable.  Holding heparin, GI has been consulted, following closely cortrenema 8/5, HH stable. Avoid constipation with MiraLAX twice daily hold for diarrhea.  Palliative care following closely  Hypokalemia resolved  Hypotension due to dehydration  needing pressors in ICU UTI and Stercoral Colitis: Currently hemodynamically stable. CT with dilated and stool filled rectum with wall thickening and surrounding inflammatory stranding (concerning for stercoral colitis/proctitis with fecal impaction) >S/p disimpaction by pccm. Complete antibiotics x 7 days. Unfortunately no urine or blood culture data   Acute Metabolic Encephalopathy: Dementia: Pleasantly confused  aaox1 at baseline.  At times gets agitated, continue delirium precaution fall precaution PT OT.  AKI on CKD IIIB: AKI improved continue gentle IV fluids  monitor avoid nephrotoxic medication Recent Labs    11/27/22 1836 11/27/22 1858 11/29/22 1144 11/30/22 0246 12/01/22 0421 12/02/22 0506 12/03/22 0625  BUN 55* 58* 26* 21 15 11 9   CREATININE 1.83* 1.90* 1.35* 1.32* 1.21* 1.09* 1.14*  CO2 23  --  24 23 23 24 24     Failure to thrive Severe protein calorie malnutrition Debility deconditioning: Continue to augment nutritional status, palliative care has been following  Hypothyroidism Cont synthroid  Anemia of chronic disease with chronic iron deficiency. Hemoglobin remained stable overall. Trend as above. Recent Labs  12/01/22 1906 12/02/22 0506 12/02/22 1355 12/02/22 1815 12/03/22 0005 12/03/22 0625  HGB 10.5* 10.2* 9.9* 8.9* 9.9* 10.3*  MCV 84.8 84.4  --   --   --   --    Goals of care: Palliative care has been following closely, DNR. PMT following up with patient's son to discuss future GOC Patient's son endorsed that they have and having goals of care discussion, developed continue treatment, given his DNR, she is high risk for decompensation, high risk for readmission.  DVT prophylaxis: Place and maintain sequential compression device Start: 12/01/22 1809 SCDs Start: 11/28/22 0734 Code Status:   Code Status: DNR Family Communication: plan of care discussed with patient/I had updated patient's son [name] yesterday  Patient status is: Inpatient because of sepsis Level of care: Med-Surg   Dispo: The patient is from:Home            Anticipated disposition: TBD. Objective: Vitals last 24 hrs: Vitals:   12/02/22 2033 12/03/22 0202 12/03/22 0423 12/03/22 0434  BP: (!) 140/81 (!) 181/69  (!) 154/58  Pulse: 95 65  82  Resp:    17  Temp:    98.2 F (36.8 C)  TempSrc:    Oral  SpO2: 100%   100%  Weight:   46.2 kg   Height:       Weight change:   Physical Examination: General exam: alert awake, not interactive not communicative eyes open HEENT:Oral mucosa moist, Ear/Nose WNL grossly Respiratory system: Bilaterally clear BS,no use of accessory muscle Cardiovascular system: S1 & S2 +, No JVD. Gastrointestinal system: Abdomen soft,NT,ND, BS+ Nervous System: Alert, awake, moving her extremities.  Does not communicate and follow any commands today Extremities: LE edema neg,distal peripheral pulses palpable and warm.  Skin: No rashes,no icterus. MSK: Normal muscle bulk,tone, power   Medications reviewed:  Scheduled Meds:  acidophilus  2 capsule Oral TID   atorvastatin  40 mg Oral Daily   cefadroxil  500 mg Oral QHS   Chlorhexidine Gluconate Cloth  6 each Topical Daily   feeding supplement   237 mL Oral BID BM   Gerhardt's butt cream   Topical BID   hydrocortisone  100 mg Rectal QHS   levothyroxine  88 mcg Oral Daily   mouth rinse  15 mL Mouth Rinse 4 times per day   pantoprazole  40 mg Oral Daily   polyethylene glycol  17 g Oral BID  Continuous Infusions:  sodium chloride     dextrose 5 % and 0.9 % NaCl Stopped (12/03/22 4098)   lactated ringers Stopped (12/02/22 1513)   Diet Order             DIET DYS 3 Room service appropriate? Yes; Fluid consistency: Thin  Diet effective now                  Intake/Output Summary (Last 24 hours) at 12/03/2022 0752 Last data filed at 12/03/2022 1191 Gross per 24 hour  Intake 2109.43 ml  Output --  Net 2109.43 ml   Net IO Since Admission: 9,528.88 mL [12/03/22 0752]  Wt Readings from Last 3 Encounters:  12/03/22 46.2 kg  11/27/20 66.6 kg  05/29/20 68.1 kg     Unresulted Labs (From admission, onward)     Start     Ordered   12/02/22 2300  Hemoglobin and hematocrit, blood  Now then every 6 hours,   R (with TIMED occurrences)     Question:  Specimen collection method  Answer:  Lab=Lab collect  12/02/22 1636   12/02/22 0500  Basic metabolic panel  Daily,   R     Question:  Specimen collection method  Answer:  Lab=Lab collect   12/01/22 0853          Data Reviewed: I have personally reviewed following labs and imaging studies CBC: Recent Labs  Lab 11/27/22 1836 11/27/22 1858 11/29/22 1144 11/30/22 0246 12/01/22 0421 12/01/22 1906 12/02/22 0506 12/02/22 1355 12/02/22 1815 12/03/22 0005 12/03/22 0625  WBC 8.7  --  5.7 6.7 4.9 5.1 4.4  --   --   --   --   NEUTROABS 7.1  --   --   --   --   --   --   --   --   --   --   HGB 12.6   < > 11.3* 11.6* 11.2* 10.5* 10.2* 9.9* 8.9* 9.9* 10.3*  HCT 39.9   < > 34.6* 36.3 34.3* 32.8* 31.8* 30.6* 27.7* 30.5* 31.1*  MCV 86.2  --  86.3 87.9 83.9 84.8 84.4  --   --   --   --   PLT 320  --  252 245 254 258 235  --   --   --   --    < > = values in this interval not  displayed.  Basic Metabolic Panel: Recent Labs  Lab 11/27/22 1836 11/27/22 1858 11/29/22 1144 11/30/22 0246 12/01/22 0421 12/02/22 0506 12/03/22 0625  NA 134*   < > 138 136 138 138 137  K 4.1   < > 3.5 3.8 3.3* 3.0* 4.0  CL 100   < > 105 106 105 103 102  CO2 23  --  24 23 23 24 24   GLUCOSE 243*   < > 127* 172* 138* 90 139*  BUN 55*   < > 26* 21 15 11 9   CREATININE 1.83*   < > 1.35* 1.32* 1.21* 1.09* 1.14*  CALCIUM 9.2  --  8.7* 8.6* 8.1* 7.5* 7.1*  MG 2.2  --  1.5* 1.6*  --   --   --   PHOS  --   --  2.0* 1.7* 2.8 3.3  --    < > = values in this interval not displayed.  GFR: Estimated Creatinine Clearance: 25.4 mL/min (A) (by C-G formula based on SCr of 1.14 mg/dL (H)). Liver Function Tests: Recent Labs  Lab 11/27/22 1836 11/29/22 1144 11/30/22 0246 12/01/22 0421 12/02/22 0506  AST 29  --   --   --   --   ALT 19  --   --   --   --   ALKPHOS 107  --   --   --   --   BILITOT 0.8  --   --   --   --   PROT 8.5*  --   --   --   --   ALBUMIN 3.7 2.9* 2.7* 2.8* 2.5*   Recent Labs  Lab 11/27/22 1836  LIPASE 32  No results for input(s): "AMMONIA" in the last 168 hours. Coagulation Profile: Recent Labs  Lab 11/27/22 1836  INR 1.1   Recent Labs  Lab 12/02/22 1636 12/02/22 2012 12/02/22 2106 12/03/22 0003 12/03/22 0435  GLUCAP 140* 113* 153* 132* 160*   Recent Labs  Lab 11/27/22 1836 11/27/22 2001 11/28/22 1316  LATICACIDVEN 1.1 1.3 1.3    Recent Results (from the past 240 hour(s))  MRSA Next Gen by PCR, Nasal     Status: None  Collection Time: 11/28/22  8:51 AM   Specimen: Nasal Mucosa; Nasal Swab  Result Value Ref Range Status   MRSA by PCR Next Gen NOT DETECTED NOT DETECTED Final    Comment: (NOTE) The GeneXpert MRSA Assay (FDA approved for NASAL specimens only), is one component of a comprehensive MRSA colonization surveillance program. It is not intended to diagnose MRSA infection nor to guide or monitor treatment for MRSA infections. Test  performance is not FDA approved in patients less than 45 years old. Performed at [facility], [address] [place], [place] [address]   MRSA Next Gen by PCR, Nasal     Status: None   Collection Time: 11/28/22  3:09 PM   Specimen: Nasal Mucosa; Nasal Swab  Result Value Ref Range Status   MRSA by PCR Next Gen NOT DETECTED NOT DETECTED Final    Comment: (NOTE) The GeneXpert MRSA Assay (FDA approved for NASAL specimens only), is one component of a comprehensive MRSA colonization surveillance program. It is not intended to diagnose MRSA infection nor to guide or monitor treatment for MRSA infections. Test performance is not FDA approved in patients less than 62 years old. Performed at [facility], [address] [place], [place] [address]   Antimicrobials: Anti-infectives (From admission, onward)    Start     Dose/Rate Route Frequency Ordered Stop   11/30/22 0838  cefadroxil (DURICEF) capsule 500 mg        500 mg Oral Daily at bedtime 11/30/22 0826 12/04/22 2159   11/29/22 2200  cefTRIAXone (ROCEPHIN) 1 g in sodium chloride 0.9 % 100 mL IVPB  Status:  Discontinued        1 g 200 mL/hr over 30 Minutes Intravenous Every 24 hours 11/29/22 1034 11/30/22 0843   11/28/22 2200  cefTRIAXone (ROCEPHIN) 1 g in sodium chloride 0.9 % 100 mL IVPB  Status:  Discontinued        1 g 200 mL/hr over 30 Minutes Intravenous Every 24 hours 11/28/22 0731 11/28/22 0931   11/28/22 2200  cefTRIAXone (ROCEPHIN) 2 g in sodium chloride 0.9 % 100 mL IVPB  Status:  Discontinued        2 g 200 mL/hr over 30 Minutes Intravenous Every 24 hours 11/28/22 0931 11/29/22 1034   11/28/22 1000  metroNIDAZOLE (FLAGYL) IVPB 500 mg        500 mg 100 mL/hr over 60 Minutes Intravenous Every 12 hours 11/28/22 0731 11/30/22 2205   11/27/22 2300  cefTRIAXone (ROCEPHIN) 1 g in sodium chloride 0.9 % 100 mL IVPB        1 g 200 mL/hr over 30 Minutes Intravenous  Once 11/27/22 2251 11/28/22 0240   11/27/22 2300   metroNIDAZOLE (FLAGYL) IVPB 500 mg        500 mg 100 mL/hr over 60 Minutes Intravenous  Once 11/27/22 2251 11/28/22 0157     Culture/Microbiology    Component Value Date/Time   SDES ASCITIC 02/08/2019 1034   SPECREQUEST BOTTLES DRAWN AEROBIC AND ANAEROBIC 10CC 02/08/2019 1034   CULT  02/08/2019 1034    NO GROWTH 5 DAYS Performed at [facility], [address], [place], [place] [address]    REPTSTATUS 02/13/2019 FINAL 02/08/2019 1034  Other culture-see note  Radiology Studies: VAS [place] UPPER EXTREMITY VENOUS DUPLEX  Result Date: 12/02/2022 UPPER VENOUS STUDY  Patient Name:  Lindsey Mcguire  Date of Exam:   12/01/2022 Medical Rec #: [id number]          Accession #:  [id number] Date of Birth: Apr 16, 1935          Patient Gender: F Patient Age:   88 years Exam Location:  [facility] Procedure:      VAS [place] UPPER EXTREMITY VENOUS DUPLEX Referring Phys: A POWELL JR --------------------------------------------------------------------------------  Indications: Swelling Limitations: Patient unable to cooperate due to altered status (combative). Comparison Study: No previous exams Performing Technologist: Jody Hill RVT, RDMS  Examination Guidelines: A complete evaluation includes B-mode imaging, spectral Doppler, color Doppler, and power Doppler as needed of all accessible portions of each vessel. Bilateral testing is considered an integral part of a complete examination. Limited examinations for reoccurring indications may be performed as noted.  Right Findings: +----------+------------+---------+-----------+----------+--------------+ RIGHT     CompressiblePhasicitySpontaneousProperties   Summary     +----------+------------+---------+-----------+----------+--------------+ IJV           Full       Yes       Yes                             +----------+------------+---------+-----------+----------+--------------+ Subclavian    Full       Yes       Yes                              +----------+------------+---------+-----------+----------+--------------+ Axillary      Full       Yes       Yes                             +----------+------------+---------+-----------+----------+--------------+ Brachial      Full       Yes       Yes                             +----------+------------+---------+-----------+----------+--------------+ Radial                                              Not visualized +----------+------------+---------+-----------+----------+--------------+ Ulnar                                               Not visualized +----------+------------+---------+-----------+----------+--------------+ Cephalic                                            Not visualized +----------+------------+---------+-----------+----------+--------------+ Basilic       Full       Yes       Yes                             +----------+------------+---------+-----------+----------+--------------+ Patient combative - unable to visualize radial or ulnar veins  Left Findings: +----------+------------+---------+-----------+----------+-------+ LEFT      CompressiblePhasicitySpontaneousPropertiesSummary +----------+------------+---------+-----------+----------+-------+ Subclavian    Full       Yes       Yes                      +----------+------------+---------+-----------+----------+-------+  Summary:  Right: No evidence of deep vein thrombosis in the upper extremity. No evidence of superficial vein thrombosis in the upper extremity. However, unable to visualize the ulnar and radial veins.  Left: No evidence of thrombosis in the subclavian.  *See table(s) above for measurements and observations.  Diagnosing physician: Gerarda Fraction Electronically signed by Gerarda Fraction on 12/02/2022 at 7:45:05 PM.    Final    LOS: 5 days  Lanae Boast, MD [facility]  12/03/2022, 7:52 AM

## 2022-12-03 NOTE — Progress Notes (Signed)
Daily Progress Note   Patient Name: Lindsey Mcguire       Date: 12/03/2022 DOB: 1935-01-17  Age: 88 y.o. MRN#: [id number] Attending Physician: Lanae Boast, MD Primary Care Physician: Lorenda Ishihara, MD Admit Date: 11/27/2022  Reason for Consultation/Follow-up: Establishing goals of care  Patient Profile/HPI:  88 y.o. female with past medical history of dementia, HTN, HLD, HFpEF, hypothyroidism, diabetes, anemia, CKD stage IV, osteoporosis, recent hospitalization for UTI/sepsis 6/22-10/28/22 followed by rehab stay admitted on 11/27/2022 with diarrhea and blood in stool with stercoral colitis with fecal disimpaction in ED. Being treated for UTI. No longer requiring vasopressor support. No longer in the ICU. GI consulted and recommended treatment with anusol suppositories, and constipation prophylaxis.   Subjective: Chart reviewed including labs, progress notes, imaging from this and previous encounters.  Noted patient had recurrent bleeding with clots yesterday, Hgb trended down slightly, but stable this morning, did not require blood transfusion.  On eval she was sleeping, did not wake to my voice.  I spoke to her son [name]. He and his family are focusing on grieving and burying their father. Their GOC are to continue to work to stabilize patient while they focus on their Dad.    Physical Exam Vitals and nursing note reviewed.  Constitutional:      Comments: frail  Pulmonary:     Effort: Pulmonary effort is normal.  Neurological:     Mental Status: She is alert.     Comments: sleeping  Psychiatric:     Comments: pleasant             Vital Signs: BP 129/73 (BP Location: Right Arm)   Pulse 74   Temp 97.8 F (36.6 C) (Oral)   Resp 17   Ht 5' (1.524 m)   Wt 46.2 kg   SpO2 100%    BMI 19.89 kg/m  SpO2: SpO2: 100 % O2 Device: O2 Device: Room Air O2 Flow Rate:    Intake/output summary:  Intake/Output Summary (Last 24 hours) at 12/03/2022 1135 Last data filed at 12/03/2022 3244 Gross per 24 hour  Intake 2049.43 ml  Output --  Net 2049.43 ml   LBM: Last BM Date : 12/02/22 Baseline Weight: Weight: 66.6 kg Most recent weight: Weight: 46.2 kg       Palliative Assessment/Data: PPS: 30%  Patient Active Problem List   Diagnosis Date Noted   Stercoral colitis 11/28/2022   Hypotension 11/28/2022   (HFpEF) heart failure with preserved ejection fraction (HCC) 11/28/2022   Hypothyroidism 11/28/2022   Hyperlipidemia 11/28/2022   Fecal impaction (HCC) 11/28/2022   Chronic constipation 11/28/2022   Chronic kidney disease (CKD), stage IV (severe) (HCC) 11/28/2022   Hyperglycemia 11/28/2022   UTI (urinary tract infection) 11/28/2022   Failure to thrive in adult 11/28/2022   Hypertension    Dementia (HCC)    Acute CHF (congestive heart failure) (HCC) 02/07/2019   CHF (congestive heart failure) (HCC) 02/06/2019   ICH (intracerebral hemorrhage) (HCC) 02/23/2017   Intracranial hemorrhage (HCC) 02/23/2017   Type 2 diabetes mellitus with stage 4 chronic kidney disease, without long-term current use of insulin (HCC) 09/24/2016   Anemia due to chronic renal failure treated with erythropoietin 02/16/2011    Palliative Care Assessment & Plan    Assessment/Recommendations/Plan  Continue current plan of care GOC are to continue to work to stabilize patient for discharge- family is focusing on their father and his burial services for now  Code Status: DNR  Prognosis:  Unable to determine  Discharge Planning: To Be Determined  Thank you for allowing the Palliative Medicine Team to assist in the care of this patient.  Total time: 50 mins    Greater than 50%  of this time was spent counseling and coordinating care related to the above assessment and  plan.  Ocie Bob, AGNP-C Palliative Medicine   Please contact Palliative Medicine Team phone at [phone] for questions and concerns.

## 2022-12-03 NOTE — Progress Notes (Signed)
[facility] Gastroenterology Progress Note  Lindsey Mcguire 88 y.o. [birth year]  CC: Rectal bleeding   Subjective: Patient seen and examined at bedside.  Not able to obtain any history from the patient.  Discussed with RN.  No further bleeding since last night or this morning.  ROS : Unable to obtain   Objective: Vital signs in last 24 hours: Vitals:   12/03/22 0434 12/03/22 0823  BP: (!) 154/58 129/73  Pulse: 82 74  Resp: 17   Temp: 98.2 F (36.8 C) 97.8 F (36.6 C)  SpO2: 100% 100%    Physical Exam: Alert patient but not participating in history taking.  Abdominal exam benign.  Lab Results: Recent Labs    12/01/22 0421 12/02/22 0506 12/03/22 0625  NA 138 138 137  K 3.3* 3.0* 4.0  CL 105 103 102  CO2 23 24 24   GLUCOSE 138* 90 139*  BUN 15 11 9   CREATININE 1.21* 1.09* 1.14*  CALCIUM 8.1* 7.5* 7.1*  PHOS 2.8 3.3  --    Recent Labs    12/01/22 0421 12/02/22 0506  ALBUMIN 2.8* 2.5*   Recent Labs    12/01/22 1906 12/02/22 0506 12/02/22 1355 12/03/22 0005 12/03/22 0625  WBC 5.1 4.4  --   --   --   HGB 10.5* 10.2*   < > 9.9* 10.3*  HCT 32.8* 31.8*   < > 30.5* 31.1*  MCV 84.8 84.4  --   --   --   PLT 258 235  --   --   --    < > = values in this interval not displayed.   No results for input(s): "LABPROT", "INR" in the last 72 hours.    Assessment/Plan: -Diarrhea with rectal bleeding.  Not able to obtain any history from the patient.  Diarrhea resolved. -Acute blood loss anemia.  Acute on chronic anemia.  Hemoglobin was around 10.3 in August 2022.  Normal hemoglobin on admission likely from hemoconcentration. -Constipation with fecal impaction.  Fecal disimpaction was performed in the ED. CT scan showed likely stercoral proctitis.   Recommendations ------------------------- -Patient had few episodes of bleeding yesterday afternoon and evening which has resolved now.  No bleeding since last night or this morning.  Hemoglobin relatively stable. -Recommend  to continue Cort enema for 2 weeks. -Called and discussed with son Kathlene November over the phone.  No plan for endoscopic intervention at this time.  He will discuss with his family and let us know if anything changes.  We may consider flex sig if recurrent bleeding after their family discussion.  Kathi Der MD, FACP 12/03/2022, 1:16 PM  Contact #  [phone]    Kathi Der MD, FACP 12/03/2022, 1:12 PM  Contact #  [phone]

## 2022-12-04 DIAGNOSIS — K5289 Other specified noninfective gastroenteritis and colitis: Secondary | ICD-10-CM | POA: Diagnosis not present

## 2022-12-04 LAB — GLUCOSE, CAPILLARY
Glucose-Capillary: 149 mg/dL — ABNORMAL HIGH (ref 70–99)
Glucose-Capillary: 127 mg/dL — ABNORMAL HIGH (ref 70–99)
Glucose-Capillary: 94 mg/dL (ref 70–99)
Glucose-Capillary: 98 mg/dL (ref 70–99)
Glucose-Capillary: 107 mg/dL — ABNORMAL HIGH (ref 70–99)

## 2022-12-04 MED ORDER — DEXTROSE-SODIUM CHLORIDE 5-0.9 % IV SOLN: INTRAVENOUS | Status: AC

## 2022-12-04 MED ORDER — POTASSIUM CHLORIDE 20 MEQ PO PACK
20.0000 meq | PACK | Freq: Once | ORAL | Status: AC
  Administered 2022-12-04: 20 meq via ORAL
  Filled 2022-12-04: qty 1

## 2022-12-04 NOTE — Progress Notes (Signed)
Physical Therapy Treatment Patient Details Name: Lindsey Mcguire MRN: [id number] DOB: 12/21/1934 Today's Date: 12/04/2022   History of Present Illness 88 year old female who had recently been discharged to SNF on 10/28/2022 from [facility] after being treated for dehydration, deconditioning, failure to thrive, weakness, sepsis from cystitis and remained in therapy for 2 weeks subsequently discharged home. She lives with her son and grandson. They noticed pt with blood in stools and brought pt to ED 11/28/22. Noted to have severe fecal impaction and disimpacted. + hypotensive and dehydrated. PMH hypertension, hyperlipidemia, hypothyroidism, dementia, HFpEF, type 2 diabetes mellitus, anemia, stage IV CKD, osteoporosis    PT Comments  Pt greeted supine in bed and needing increased time and vocal stimuli to wake. Pt with continued lethargy however able to perform bed mobility with mod A to come to sitting EOB as pt alertness improving and pt able to assist to elevate trunk to sitting. Pt able to maintain sitting EOB with feet unsupported at supervision level ~5 mins, however when attempting to scoot out to EOB to place feet on floor for transfer attempts pt with strong retropulsion and lying back. Pt needing max A to return to supine at end of session. Current plan remains appropriate to address deficits and maximize functional independence and decrease caregiver burden. Pt continues to benefit from skilled PT services to progress toward functional mobility goals.      If plan is discharge home, recommend the following: Two people to help with walking and/or transfers;Assist for transportation;Assistance with cooking/housework;Help with stairs or ramp for entrance   Can travel by private vehicle     No  Equipment Recommendations  Wheelchair cushion (measurements PT);Wheelchair (measurements PT);Other (comment);Hospital bed (hoyer lift)    Recommendations for Other Services       Precautions /  Restrictions Precautions Precautions: Fall Restrictions Weight Bearing Restrictions: No     Mobility  Bed Mobility Overal bed mobility: Needs Assistance Bed Mobility: Supine to Sit, Sit to Supine, Rolling Rolling: Total assist   Supine to sit: Mod assist Sit to supine: Max assist   General bed mobility comments: pt with poor sequencing and initiation, once movement toward EOB started pt able to asssit and eleavte trunk, pt able to maintain sitting EOB without external assist with feet dangling above floor, attempted to assist to scoot pt out so feet woudl rest on floor and pt resisting and lying back    Transfers                   General transfer comment: Pt was resistant to mobility, multiple attempts to get pt to stand. pt would say okay then stay sitting, heavy multi modal cueing and encouragement without success.    Ambulation/Gait               General Gait Details: unable to progress.   Stairs             Wheelchair Mobility     Tilt Bed    Modified Rankin (Stroke Patients Only)       Balance Overall balance assessment: Needs assistance Sitting-balance support: Single extremity supported, Feet unsupported Sitting balance-Leahy Scale: Fair Sitting balance - Comments: able to maintain without assist       Standing balance comment: Unable to get to standing.                            Cognition Arousal: Lethargic, Alert (becoming more alert once  up EOB) Behavior During Therapy: Agitated, Flat affect Overall Cognitive Status: No family/caregiver present to determine baseline cognitive functioning                                 General Comments: pt lethargic on arrival, sleep but woken with transfer to sitting EOB, pt with poor command following, however able to maintain sitting up EOB without external assist        Exercises Other Exercises Other Exercises: pt unable to follow commands for exdercises this  session    General Comments        Pertinent Vitals/Pain Pain Assessment Pain Assessment: Faces Faces Pain Scale: Hurts a little bit Pain Location: generalized, bottom with peri-care Pain Descriptors / Indicators: Grimacing, Guarding Pain Intervention(s): Monitored during session, Limited activity within patient's tolerance, Repositioned    Home Living                          Prior Function            PT Goals (current goals can now be found in the care plan section) Acute Rehab PT Goals Patient Stated Goal: None stated pt unable to participate and family not available at this time. PT Goal Formulation: Patient unable to participate in goal setting Time For Goal Achievement: 12/15/22 Progress towards PT goals: Not progressing toward goals - comment (cognition)    Frequency    Min 1X/week      PT Plan      Co-evaluation              AM-PAC PT "6 Clicks" Mobility   Outcome Measure  Help needed turning from your back to your side while in a flat bed without using bedrails?: Total Help needed moving from lying on your back to sitting on the side of a flat bed without using bedrails?: Total Help needed moving to and from a bed to a chair (including a wheelchair)?: Total Help needed standing up from a chair using your arms (e.g., wheelchair or bedside chair)?: Total Help needed to walk in hospital room?: Total Help needed climbing 3-5 steps with a railing? : Total 6 Click Score: 6    End of Session   Activity Tolerance: Patient limited by lethargy;Other (comment) (pt limited by cognition) Patient left: in bed;with call bell/phone within reach;with bed alarm set Nurse Communication: Mobility status PT Visit Diagnosis: Other abnormalities of gait and mobility (R26.89)     Time: 7253-6644 PT Time Calculation (min) (ACUTE ONLY): 18 min  Charges:    $Therapeutic Activity: 8-22 mins PT General Charges $$ ACUTE PT VISIT: 1 Visit                       R. PTA Acute Rehabilitation Services Office: [phone]   Catalina Antigua 12/04/2022, 12:26 PM

## 2022-12-04 NOTE — Plan of Care (Signed)

## 2022-12-04 NOTE — Progress Notes (Signed)
PROGRESS NOTE Lindsey Mcguire  NFA:[id number] DOB: Sep 19, 1934 DOA: 11/27/2022 PCP: Lorenda Ishihara, MD  Brief Narrative/Hospital Course: [phone] with hypertension, hyperlipidemia, hypothyroidism, dementia, HFpEF, type 2 diabetes mellitus, anemia, stage IV CKD, osteoporosis who had recently been discharged on 10/28/2022 from [facility] after being treated for dehydration, deconditioning, failure to thrive, weakness, sepsis from cystitis and was subsequently discharged to SNF and remained in therapy for 2 weeks subsequently discharged home.  Her husband recently passed away.  She lives with her son and grandson.  Family reported that she has been having diarrhea for the last several days and noticed some blood in her stool.  They brought her to the ED for further evaluation. Noted to be extremely debilitated,cachectic and had severe fecal impaction with a large stool ball which required disimpaction in the ED and stercoral colitis noted. She was also noted to be hypotensive and dehydrated and eventually required low-dose norepinephrine infusion to maintain blood pressure. She was started on antibiotic therapy and admission requested for further management. Patient was treated for sepsis due to UTI and stercoral colitis, transferred out of ICU 8/4. 8/4-noticed some blood per rectum heparin DVT prophylaxis discontinued CBC ordered and GI was consulted No plan for endoscopic intervention at this time GI has discussed with family consider flex sigmoidoscopy for recurrent bleeding, advised to continue Cort enema for 2 weeks      Subjective: Patient seen and examined this morning Overnight afebrile, BP stable  Labs reviewed this morning hemoglobin overall stable at 9.4 g and reports a stable slightly low potassium Per RN overnight no bleeding but has had some black stool.  Assessment and Plan: Principal Problem:   Stercoral colitis Active Problems:   Failure to thrive in adult   Type 2  diabetes mellitus with stage 4 chronic kidney disease, without long-term current use of insulin (HCC)   Hypotension   (HFpEF) heart failure with preserved ejection fraction (HCC)   Hypertension   Dementia (HCC)   Hypothyroidism   Hyperlipidemia   Fecal impaction (HCC)   Chronic constipation   Chronic kidney disease (CKD), stage IV (severe) (HCC)   Hyperglycemia   UTI (urinary tract infection)   Rectal bleeding Stercoral colitis/proctitis with fecal impaction: Dilated and stool-filled rectum with wall thickening and surrounding inflammatory stranding on admission.s/p manual disimpaction by ED.Hemoglobin has overall been overall stable.GI has been consulted, following closely cortrenema 8/5>No plan for endoscopic intervention at this time GI has discussed with family consider flex sigmoidoscopy for recurrent bleeding, advised to continue Cort enema for 2 weeks.  Monitor hemoglobin continue MiraLAX twice daily to avoid constipation and hold for diarrhea.  Hypokalemia Replete po. Recent Labs  Lab 11/30/22 0246 12/01/22 0421 12/02/22 0506 12/03/22 0625 12/04/22 0433  K 3.8 3.3* 3.0* 4.0 3.4*    Hypotension due to dehydration  needing pressors in ICU UTI and Stercoral Colitis: Currently hemodynamically stable. CT with dilated and stool filled rectum with wall thickening and surrounding inflammatory stranding (concerning for stercoral colitis/proctitis with fecal impaction) >S/p disimpaction on admission.   Complete antibiotics x 7 days. Unfortunately no urine or blood culture data   Hypoglycemic due to poor oral intake: On gentle D5, weaning off tonight, encourage po and feed with assistance Recent Labs  Lab 11/27/22 1834 11/28/22 0754 12/03/22 1627 12/03/22 2120 12/04/22 0412 12/04/22 0732 12/04/22 1148  GLUCAP  --    < > 94 98 149* 127* 94  HGBA1C 6.0*  --   --   --   --   --   --    < > =  values in this interval not displayed.    Acute Metabolic  Encephalopathy: Dementia: Pleasantly confused  aaox1 at baseline with her dementia.  At times agitated.  Continue delirium precaution fall precaution  AKI on CKD IIIB: AKI improved .encourage oral hydration, wean off IV fluids  Recent Labs    11/27/22 1836 11/27/22 1858 11/29/22 1144 11/30/22 0246 12/01/22 0421 12/02/22 0506 12/03/22 0625 12/04/22 0433  BUN 55* 58* 26* 21 15 11 9  6*  CREATININE 1.83* 1.90* 1.35* 1.32* 1.21* 1.09* 1.14* 1.03*  CO2 23  --  24 23 23 24 24 23     Failure to thrive Severe protein calorie malnutrition Debility deconditioning: Debilitated, overall prognosis not bad, continue supportive care nutritional augmentation   Hypothyroidism Cont synthroid  Anemia of chronic disease with chronic iron deficiency. Hemoglobin has been stable, trend Recent Labs    12/01/22 1906 12/02/22 0506 12/02/22 1355 12/02/22 1815 12/03/22 0005 12/03/22 0625 12/04/22 0433  HGB 10.5* 10.2* 9.9* 8.9* 9.9* 10.3* 9.4*  MCV 84.8 84.4  --   --   --   --   --    Goals of care: Palliative care has been following closely, DNR. PMT following up with patient's son to discuss future GOC Patient's son endorsed that they have and having goals of care discussion, developed continue treatment, given his DNR, she is high risk for decompensation, high risk for readmission.  Family at this time focusing on funeral for patient's husband, has been updated.  DVT prophylaxis: Place and maintain sequential compression device Start: 12/01/22 1809 SCDs Start: 11/28/22 0734 Code Status:   Code Status: DNR Family Communication: plan of care discussed with patient/I had updated patient's son [name] 8/5  Patient status is: Inpatient because of sepsis Level of care: Med-Surg   Dispo: The patient is from:Home            Anticipated disposition: TBD. Objective: Vitals last 24 hrs: Vitals:   12/03/22 1524 12/03/22 2117 12/04/22 0420 12/04/22 0732  BP: (!) 159/106 (!) 122/92 128/83 132/75   Pulse: 91 77 82 73  Resp: 18 18 17 17   Temp:  97.7 F (36.5 C) 97.9 F (36.6 C) 97.7 F (36.5 C)  TempSrc:    Oral  SpO2: 100% 100% 100% 100%  Weight:      Height:       Weight change:   Physical Examination: General exam: alert awake, oriented to self only HEENT:Oral mucosa moist, Ear/Nose WNL grossly Respiratory system: Bilaterally clear BS,no use of accessory muscle Cardiovascular system: S1 & S2 +, No JVD. Gastrointestinal system: Abdomen soft,NT,ND, BS+ Nervous System: Alert, awake, pleasantly confused and demented  Extremities: LE edema neg,distal peripheral pulses palpable and warm.  Skin: No rashes,no icterus. MSK: Normal muscle bulk,tone, power   Medications reviewed:  Scheduled Meds:  acidophilus  2 capsule Oral TID   atorvastatin  40 mg Oral Daily   Chlorhexidine Gluconate Cloth  6 each Topical Daily   feeding supplement  237 mL Oral BID BM   Gerhardt's butt cream   Topical BID   hydrocortisone  100 mg Rectal QHS   levothyroxine  88 mcg Oral Daily   mouth rinse  15 mL Mouth Rinse 4 times per day   pantoprazole  40 mg Oral Daily   polyethylene glycol  17 g Oral BID  Continuous Infusions:  sodium chloride     dextrose 5 % and 0.9 % NaCl 30 mL/hr at 12/04/22 4401   Diet Order  DIET DYS 3 Room service appropriate? Yes; Fluid consistency: Thin  Diet effective now                  Intake/Output Summary (Last 24 hours) at 12/04/2022 1156 Last data filed at 12/04/2022 0400 Gross per 24 hour  Intake 900 ml  Output --  Net 900 ml   Net IO Since Admission: 10,428.88 mL [12/04/22 1156]  Wt Readings from Last 3 Encounters:  12/03/22 46.2 kg  11/27/20 66.6 kg  05/29/20 68.1 kg     Unresulted Labs (From admission, onward)     Start     Ordered   12/05/22 0500  CBC  Daily,   R     Question:  Specimen collection method  Answer:  Lab=Lab collect   12/04/22 0814   12/02/22 0500  Basic metabolic panel  Daily,   R     Question:  Specimen  collection method  Answer:  Lab=Lab collect   12/01/22 0853          Data Reviewed: I have personally reviewed following labs and imaging studies CBC: Recent Labs  Lab 11/27/22 1836 11/27/22 1858 11/29/22 1144 11/30/22 0246 12/01/22 0421 12/01/22 1906 12/02/22 0506 12/02/22 1355 12/02/22 1815 12/03/22 0005 12/03/22 0625 12/04/22 0433  WBC 8.7  --  5.7 6.7 4.9 5.1 4.4  --   --   --   --   --   NEUTROABS 7.1  --   --   --   --   --   --   --   --   --   --   --   HGB 12.6   < > 11.3* 11.6* 11.2* 10.5* 10.2* 9.9* 8.9* 9.9* 10.3* 9.4*  HCT 39.9   < > 34.6* 36.3 34.3* 32.8* 31.8* 30.6* 27.7* 30.5* 31.1* 29.4*  MCV 86.2  --  86.3 87.9 83.9 84.8 84.4  --   --   --   --   --   PLT 320  --  252 245 254 258 235  --   --   --   --   --    < > = values in this interval not displayed.  Basic Metabolic Panel: Recent Labs  Lab 11/27/22 1836 11/27/22 1858 11/29/22 1144 11/30/22 0246 12/01/22 0421 12/02/22 0506 12/03/22 0625 12/04/22 0433  NA 134*   < > 138 136 138 138 137 137  K 4.1   < > 3.5 3.8 3.3* 3.0* 4.0 3.4*  CL 100   < > 105 106 105 103 102 100  CO2 23  --  24 23 23 24 24 23   GLUCOSE 243*   < > 127* 172* 138* 90 139* 158*  BUN 55*   < > 26* 21 15 11 9  6*  CREATININE 1.83*   < > 1.35* 1.32* 1.21* 1.09* 1.14* 1.03*  CALCIUM 9.2  --  8.7* 8.6* 8.1* 7.5* 7.1* 7.0*  MG 2.2  --  1.5* 1.6*  --   --   --   --   PHOS  --   --  2.0* 1.7* 2.8 3.3  --   --    < > = values in this interval not displayed.  GFR: Estimated Creatinine Clearance: 28.2 mL/min (A) (by C-G formula based on SCr of 1.03 mg/dL (H)). Liver Function Tests: Recent Labs  Lab 11/27/22 1836 11/29/22 1144 11/30/22 0246 12/01/22 0421 12/02/22 0506  AST 29  --   --   --   --  ALT 19  --   --   --   --   ALKPHOS 107  --   --   --   --   BILITOT 0.8  --   --   --   --   PROT 8.5*  --   --   --   --   ALBUMIN 3.7 2.9* 2.7* 2.8* 2.5*   Recent Labs  Lab 11/27/22 1836  LIPASE 32  No results for input(s):  "AMMONIA" in the last 168 hours. Coagulation Profile: Recent Labs  Lab 11/27/22 1836  INR 1.1   Recent Labs  Lab 12/03/22 1627 12/03/22 2120 12/04/22 0412 12/04/22 0732 12/04/22 1148  GLUCAP 94 98 149* 127* 94   Recent Labs  Lab 11/27/22 1836 11/27/22 2001 11/28/22 1316  LATICACIDVEN 1.1 1.3 1.3    Recent Results (from the past 240 hour(s))  MRSA Next Gen by PCR, Nasal     Status: None   Collection Time: 11/28/22  8:51 AM   Specimen: Nasal Mucosa; Nasal Swab  Result Value Ref Range Status   MRSA by PCR Next Gen NOT DETECTED NOT DETECTED Final    Comment: (NOTE) The GeneXpert MRSA Assay (FDA approved for NASAL specimens only), is one component of a comprehensive MRSA colonization surveillance program. It is not intended to diagnose MRSA infection nor to guide or monitor treatment for MRSA infections. Test performance is not FDA approved in patients less than 60 years old. Performed at [facility], [address], [place], [place] [address]   MRSA Next Gen by PCR, Nasal     Status: None   Collection Time: 11/28/22  3:09 PM   Specimen: Nasal Mucosa; Nasal Swab  Result Value Ref Range Status   MRSA by PCR Next Gen NOT DETECTED NOT DETECTED Final    Comment: (NOTE) The GeneXpert MRSA Assay (FDA approved for NASAL specimens only), is one component of a comprehensive MRSA colonization surveillance program. It is not intended to diagnose MRSA infection nor to guide or monitor treatment for MRSA infections. Test performance is not FDA approved in patients less than 74 years old. Performed at [facility], [address], [place], [place] [address]   Antimicrobials: Anti-infectives (From admission, onward)    Start     Dose/Rate Route Frequency Ordered Stop   11/30/22 0838  cefadroxil (DURICEF) capsule 500 mg        500 mg Oral Daily at bedtime 11/30/22 0826 12/03/22 2215   11/29/22 2200  cefTRIAXone (ROCEPHIN) 1 g in sodium chloride 0.9 % 100 mL IVPB  Status:   Discontinued        1 g 200 mL/hr over 30 Minutes Intravenous Every 24 hours 11/29/22 1034 11/30/22 0843   11/28/22 2200  cefTRIAXone (ROCEPHIN) 1 g in sodium chloride 0.9 % 100 mL IVPB  Status:  Discontinued        1 g 200 mL/hr over 30 Minutes Intravenous Every 24 hours 11/28/22 0731 11/28/22 0931   11/28/22 2200  cefTRIAXone (ROCEPHIN) 2 g in sodium chloride 0.9 % 100 mL IVPB  Status:  Discontinued        2 g 200 mL/hr over 30 Minutes Intravenous Every 24 hours 11/28/22 0931 11/29/22 1034   11/28/22 1000  metroNIDAZOLE (FLAGYL) IVPB 500 mg        500 mg 100 mL/hr over 60 Minutes Intravenous Every 12 hours 11/28/22 0731 11/30/22 2205   11/27/22 2300  cefTRIAXone (ROCEPHIN) 1 g in sodium chloride 0.9 %  100 mL IVPB        1 g 200 mL/hr over 30 Minutes Intravenous  Once 11/27/22 2251 11/28/22 0240   11/27/22 2300  metroNIDAZOLE (FLAGYL) IVPB 500 mg        500 mg 100 mL/hr over 60 Minutes Intravenous  Once 11/27/22 2251 11/28/22 0157     Culture/Microbiology    Component Value Date/Time   SDES ASCITIC 02/08/2019 1034   SPECREQUEST BOTTLES DRAWN AEROBIC AND ANAEROBIC 10CC 02/08/2019 1034   CULT  02/08/2019 1034    NO GROWTH 5 DAYS Performed at [facility], [address], [place], [place] [address]    REPTSTATUS 02/13/2019 FINAL 02/08/2019 1034  Other culture-see note  Radiology Studies: No results found. LOS: 6 days  Lanae Boast, MD [facility]  12/04/2022, 11:56 AM

## 2022-12-04 NOTE — Progress Notes (Signed)
[facility] Gastroenterology Progress Note  Lindsey Mcguire 88 y.o. [birth year]  CC: Rectal bleeding   Subjective: Patient seen and examined at bedside.  Patient care team was at bedside.  No evidence of active bleeding according to patient care team.  ROS : Unable to obtain   Objective: Vital signs in last 24 hours: Vitals:   12/04/22 0420 12/04/22 0732  BP: 128/83 132/75  Pulse: 82 73  Resp: 17 17  Temp: 97.9 F (36.6 C) 97.7 F (36.5 C)  SpO2: 100% 100%    Physical Exam: Alert patient but not participating in history taking.  Abdominal exam benign.  Lab Results: Recent Labs    12/02/22 0506 12/03/22 0625 12/04/22 0433  NA 138 137 137  K 3.0* 4.0 3.4*  CL 103 102 100  CO2 24 24 23   GLUCOSE 90 139* 158*  BUN 11 9 6*  CREATININE 1.09* 1.14* 1.03*  CALCIUM 7.5* 7.1* 7.0*  PHOS 3.3  --   --    Recent Labs    12/02/22 0506  ALBUMIN 2.5*   Recent Labs    12/01/22 1906 12/02/22 0506 12/02/22 1355 12/03/22 0625 12/04/22 0433  WBC 5.1 4.4  --   --   --   HGB 10.5* 10.2*   < > 10.3* 9.4*  HCT 32.8* 31.8*   < > 31.1* 29.4*  MCV 84.8 84.4  --   --   --   PLT 258 235  --   --   --    < > = values in this interval not displayed.   No results for input(s): "LABPROT", "INR" in the last 72 hours.    Assessment/Plan: -Diarrhea with rectal bleeding.  Not able to obtain any history from the patient.  Diarrhea resolved. -Acute blood loss anemia.  Acute on chronic anemia.  Hemoglobin was around 10.3 in August 2022.  Normal hemoglobin on admission likely from hemoconcentration. -Constipation with fecal impaction.  Fecal disimpaction was performed in the ED. CT scan showed likely stercoral proctitis.   Recommendations ------------------------- -No evidence of active bleeding at this time according to patient care team.  Hemoglobin stable. -Recommend to continue Cort enema for 2 weeks. -No further inpatient GI workup planned.  GI will sign off.  Call us back if  needed.   Kathi Der MD, FACP 12/04/2022, 11:34 AM  Contact #  [phone]    Kathi Der MD, FACP 12/04/2022, 11:34 AM  Contact #  [phone]

## 2022-12-05 DIAGNOSIS — K5289 Other specified noninfective gastroenteritis and colitis: Secondary | ICD-10-CM | POA: Diagnosis not present

## 2022-12-05 LAB — GLUCOSE, CAPILLARY
Glucose-Capillary: 113 mg/dL — ABNORMAL HIGH (ref 70–99)
Glucose-Capillary: 114 mg/dL — ABNORMAL HIGH (ref 70–99)
Glucose-Capillary: 116 mg/dL — ABNORMAL HIGH (ref 70–99)
Glucose-Capillary: 119 mg/dL — ABNORMAL HIGH (ref 70–99)
Glucose-Capillary: 97 mg/dL (ref 70–99)
Glucose-Capillary: 99 mg/dL (ref 70–99)

## 2022-12-05 MED ORDER — POLYETHYLENE GLYCOL 3350 17 G PO PACK: 17.0000 g | PACK | Freq: Every day | ORAL | Status: DC | PRN

## 2022-12-05 NOTE — Progress Notes (Signed)
PROGRESS NOTE Lindsey Mcguire  DGU:[id number] DOB: Sep 07, 1934 DOA: 11/27/2022 PCP: Lorenda Ishihara, MD  Brief Narrative/Hospital Course: [phone] with hypertension, hyperlipidemia, hypothyroidism, dementia, HFpEF, type 2 diabetes mellitus, anemia, stage IV CKD, osteoporosis who had recently been discharged on 10/28/2022 from [facility] after being treated for dehydration, deconditioning, failure to thrive, weakness, sepsis from cystitis and was subsequently discharged to SNF and remained in therapy for 2 weeks subsequently discharged home.  Her husband recently passed away.  She lives with her son and grandson.  Family reported that she has been having diarrhea for the last several days and noticed some blood in her stool.  They brought her to the ED for further evaluation. Noted to be extremely debilitated,cachectic and had severe fecal impaction with a large stool ball which required disimpaction in the ED and stercoral colitis noted. She was also noted to be hypotensive and dehydrated and eventually required low-dose norepinephrine infusion to maintain blood pressure. She was started on antibiotic therapy and admission requested for further management. Patient was treated for sepsis due to UTI and stercoral colitis, transferred out of ICU 8/4. 8/4-noticed some blood per rectum heparin DVT prophylaxis discontinued CBC ordered and GI was consulted No plan for endoscopic intervention at this time GI has discussed with family consider flex sigmoidoscopy for recurrent bleeding, advised to continue Cort enema for 2 weeks  Subjective: Patient seen and examined. Alert awake oriented to self Overnight afebrile BP stable off dextrose drip and blood sugar remained stable  No bleeding recurrence but having loose stool , her bid MiraLAX will be stopped today Labs reviewed with stable hemoglobin 9.0 g creatinine 1.0  Assessment and Plan: Principal Problem:   Stercoral colitis Active Problems:    Failure to thrive in adult   Type 2 diabetes mellitus with stage 4 chronic kidney disease, without long-term current use of insulin (HCC)   Hypotension   (HFpEF) heart failure with preserved ejection fraction (HCC)   Hypertension   Dementia (HCC)   Hypothyroidism   Hyperlipidemia   Fecal impaction (HCC)   Chronic constipation   Chronic kidney disease (CKD), stage IV (severe) (HCC)   Hyperglycemia   UTI (urinary tract infection)   Rectal bleeding Stercoral colitis/proctitis with fecal impaction: Dilated and stool-filled rectum with wall thickening and surrounding inflammatory stranding on admission.s/p manual disimpaction by ED. Hemoglobin has overall been overall stable. GI has seen started on cortrenema 8/5>No plan for endoscopic intervention at this time, advised to continue Cortenema for 2 weeks.Monitor hb intermittently.  Due to logistical will change MiraLAX from twice daily to PRN  Hypokalemia Repsolved. Recent Labs  Lab 12/01/22 0421 12/02/22 0506 12/03/22 0625 12/04/22 0433 12/05/22 0920  K 3.3* 3.0* 4.0 3.4* 3.6    Hypotension due to dehydration  needing pressors in ICU UTI and Stercoral Colitis: Currently hemodynamically stable. CT with dilated and stool filled rectum with wall thickening and surrounding inflammatory stranding (concerning for stercoral colitis/proctitis with fecal impaction) >S/p disimpaction on admission.   Completed antibiotics 12/03/22. Unfortunately no urine or blood culture data   Hypoglycemic due to poor oral intake: D5 discontinued 8/7, blood sugar stable encourage oral feeding with assistance  Recent Labs  Lab 12/04/22 1833 12/04/22 2022 12/05/22 0008 12/05/22 0447 12/05/22 0841  GLUCAP 98 107* 114* 113* 97    Acute Metabolic Encephalopathy: Dementia: Pleasantly confused  aaox1 at baseline with her dementia.Continue delirium precaution fall precaution  AKI on CKD IIIB: AKI improved as below, off ivf. Encourage PO w/ assistance Recent  Labs  11/27/22 1836 11/27/22 1858 11/29/22 1144 11/30/22 0246 12/01/22 0421 12/02/22 0506 12/03/22 0625 12/04/22 0433 12/05/22 0920  BUN 55* 58* 26* 21 15 11 9  6* 7*  CREATININE 1.83* 1.90* 1.35* 1.32* 1.21* 1.09* 1.14* 1.03* 1.09*  CO2 23  --  24 23 23 24 24 23 22     Failure to thrive Severe protein calorie malnutrition Debility deconditioning: Debilitated, overall prognosis does not appear bright.  Continue supportive care status continue palliative outpatient   Hypothyroidism Cont synthroid  Anemia of chronic disease with chronic iron deficiency. Hemoglobin has been stable, trend Recent Labs    12/02/22 0506 12/02/22 1355 12/02/22 1815 12/03/22 0005 12/03/22 0625 12/04/22 0433 12/05/22 0920  HGB 10.2*   < > 8.9* 9.9* 10.3* 9.4* 9.0*  MCV 84.4  --   --   --   --   --  86.1   < > = values in this interval not displayed.   Goals of care: Palliative care has been following closely, DNR. PMT following up with patient's son to discuss future GOC Patient's son endorsed that they have and having goals of care discussion, developed continue treatment, given his DNR, she is high risk for decompensation, high risk for readmission.  Family at this time focusing on funeral for patient's husband, has been updated.  DVT prophylaxis: Place and maintain sequential compression device Start: 12/01/22 1809 SCDs Start: 11/28/22 0734 Code Status:   Code Status: DNR Family Communication: plan of care discussed with patient/I had updated patient's son [name] 8/5  Patient status is: Inpatient because of sepsis Level of care: Med-Surg   Dispo: The patient is from:Home            Anticipated disposition: SNF once bed available  Objective: Vitals last 24 hrs: Vitals:   12/04/22 2036 12/05/22 0032 12/05/22 0453 12/05/22 0800  BP: (!) 146/68 (!) 144/70 136/74 (!) 113/56  Pulse: 73 80 84 72  Resp: 16 18 16 14   Temp: 98.9 F (37.2 C) 98.8 F (37.1 C) 99.3 F (37.4 C) 97.6 F (36.4 C)   TempSrc: Oral Oral Oral   SpO2: 97% 99%  100%  Weight:      Height:       Weight change:   Physical Examination: General exam: alert awake, oriented x1, pleasantly confused HEENT:Oral mucosa moist, Ear/Nose WNL grossly Respiratory system: Bilaterally clear BS,no use of accessory muscle Cardiovascular system: S1 & S2 +, No JVD. Gastrointestinal system: Abdomen soft,NT,ND, BS+ Nervous System: Alert, awake, moving all extremities,and following commands. Extremities: LE edema neg,distal peripheral pulses palpable and warm.  Skin: No rashes,no icterus. MSK: Normal muscle bulk,tone, power   Medications reviewed:  Scheduled Meds:  acidophilus  2 capsule Oral TID   atorvastatin  40 mg Oral Daily   Chlorhexidine Gluconate Cloth  6 each Topical Daily   feeding supplement  237 mL Oral BID BM   Gerhardt's butt cream   Topical BID   hydrocortisone  100 mg Rectal QHS   levothyroxine  88 mcg Oral Daily   mouth rinse  15 mL Mouth Rinse 4 times per day   pantoprazole  40 mg Oral Daily  Continuous Infusions:  sodium chloride     Diet Order             DIET DYS 3 Room service appropriate? Yes; Fluid consistency: Thin  Diet effective now                  Intake/Output Summary (Last 24 hours) at 12/05/2022  1117 Last data filed at 12/05/2022 1000 Gross per 24 hour  Intake 707 ml  Output --  Net 707 ml   Net IO Since Admission: 11,045.88 mL [12/05/22 1117]  Wt Readings from Last 3 Encounters:  12/03/22 46.2 kg  11/27/20 66.6 kg  05/29/20 68.1 kg     Unresulted Labs (From admission, onward)     Start     Ordered   12/05/22 0500  CBC  Daily,   R     Question:  Specimen collection method  Answer:  Lab=Lab collect   12/04/22 0814   12/02/22 0500  Basic metabolic panel  Daily,   R     Question:  Specimen collection method  Answer:  Lab=Lab collect   12/01/22 0853          Data Reviewed: I have personally reviewed following labs and imaging studies CBC: Recent Labs  Lab  11/30/22 0246 12/01/22 0421 12/01/22 1906 12/02/22 0506 12/02/22 1355 12/02/22 1815 12/03/22 0005 12/03/22 0625 12/04/22 0433 12/05/22 0920  WBC 6.7 4.9 5.1 4.4  --   --   --   --   --  4.9  HGB 11.6* 11.2* 10.5* 10.2*   < > 8.9* 9.9* 10.3* 9.4* 9.0*  HCT 36.3 34.3* 32.8* 31.8*   < > 27.7* 30.5* 31.1* 29.4* 28.6*  MCV 87.9 83.9 84.8 84.4  --   --   --   --   --  86.1  PLT 245 254 258 235  --   --   --   --   --  157   < > = values in this interval not displayed.  Basic Metabolic Panel: Recent Labs  Lab 11/29/22 1144 11/30/22 0246 12/01/22 0421 12/02/22 0506 12/03/22 0625 12/04/22 0433 12/05/22 0920  NA 138 136 138 138 137 137 138  K 3.5 3.8 3.3* 3.0* 4.0 3.4* 3.6  CL 105 106 105 103 102 100 109  CO2 24 23 23 24 24 23 22   GLUCOSE 127* 172* 138* 90 139* 158* 98  BUN 26* 21 15 11 9  6* 7*  CREATININE 1.35* 1.32* 1.21* 1.09* 1.14* 1.03* 1.09*  CALCIUM 8.7* 8.6* 8.1* 7.5* 7.1* 7.0* 6.9*  MG 1.5* 1.6*  --   --   --   --   --   PHOS 2.0* 1.7* 2.8 3.3  --   --   --   GFR: Estimated Creatinine Clearance: 26.6 mL/min (A) (by C-G formula based on SCr of 1.09 mg/dL (H)). Recent Labs  Lab 11/29/22 1144 11/30/22 0246 12/01/22 0421 12/02/22 0506  ALBUMIN 2.9* 2.7* 2.8* 2.5*   Recent Labs  Lab 12/04/22 1833 12/04/22 2022 12/05/22 0008 12/05/22 0447 12/05/22 0841  GLUCAP 98 107* 114* 113* 97   Recent Labs  Lab 11/28/22 1316  LATICACIDVEN 1.3    Recent Results (from the past 240 hour(s))  MRSA Next Gen by PCR, Nasal     Status: None   Collection Time: 11/28/22  8:51 AM   Specimen: Nasal Mucosa; Nasal Swab  Result Value Ref Range Status   MRSA by PCR Next Gen NOT DETECTED NOT DETECTED Final    Comment: (NOTE) The GeneXpert MRSA Assay (FDA approved for NASAL specimens only), is one component of a comprehensive MRSA colonization surveillance program. It is not intended to diagnose MRSA infection nor to guide or monitor treatment for MRSA infections. Test performance  is not FDA approved in patients less than 27 years old. Performed at [facility], [address] [place], [place] [address]   MRSA Next Gen by PCR, Nasal     Status: None   Collection Time: 11/28/22  3:09 PM   Specimen: Nasal Mucosa; Nasal Swab  Result Value Ref Range Status   MRSA by PCR Next Gen NOT DETECTED NOT DETECTED Final    Comment: (NOTE) The GeneXpert MRSA Assay (FDA approved for NASAL specimens only), is one component of a comprehensive MRSA colonization surveillance program. It is not intended to diagnose MRSA infection nor to guide or monitor treatment for MRSA infections. Test performance is not FDA approved in patients less than 29 years old. Performed at [facility], [address] [place], [place] [address]   Antimicrobials: Anti-infectives (From admission, onward)    Start     Dose/Rate Route Frequency Ordered Stop   11/30/22 0838  cefadroxil (DURICEF) capsule 500 mg        500 mg Oral Daily at bedtime 11/30/22 0826 12/03/22 2215   11/29/22 2200  cefTRIAXone (ROCEPHIN) 1 g in sodium chloride 0.9 % 100 mL IVPB  Status:  Discontinued        1 g 200 mL/hr over 30 Minutes Intravenous Every 24 hours 11/29/22 1034 11/30/22 0843   11/28/22 2200  cefTRIAXone (ROCEPHIN) 1 g in sodium chloride 0.9 % 100 mL IVPB  Status:  Discontinued        1 g 200 mL/hr over 30 Minutes Intravenous Every 24 hours 11/28/22 0731 11/28/22 0931   11/28/22 2200  cefTRIAXone (ROCEPHIN) 2 g in sodium chloride 0.9 % 100 mL IVPB  Status:  Discontinued        2 g 200 mL/hr over 30 Minutes Intravenous Every 24 hours 11/28/22 0931 11/29/22 1034   11/28/22 1000  metroNIDAZOLE (FLAGYL) IVPB 500 mg        500 mg 100 mL/hr over 60 Minutes Intravenous Every 12 hours 11/28/22 0731 11/30/22 2205   11/27/22 2300  cefTRIAXone (ROCEPHIN) 1 g in sodium chloride 0.9 % 100 mL IVPB        1 g 200 mL/hr over 30 Minutes Intravenous  Once 11/27/22 2251 11/28/22 0240   11/27/22 2300  metroNIDAZOLE (FLAGYL)  IVPB 500 mg        500 mg 100 mL/hr over 60 Minutes Intravenous  Once 11/27/22 2251 11/28/22 0157     Culture/Microbiology    Component Value Date/Time   SDES ASCITIC 02/08/2019 1034   SPECREQUEST BOTTLES DRAWN AEROBIC AND ANAEROBIC 10CC 02/08/2019 1034   CULT  02/08/2019 1034    NO GROWTH 5 DAYS Performed at [facility], [address], [place], [place] [address]    REPTSTATUS 02/13/2019 FINAL 02/08/2019 1034  Other culture-see note  Radiology Studies: No results found. LOS: 7 days  Lanae Boast, MD [facility]  12/05/2022, 11:17 AM

## 2022-12-05 NOTE — Plan of Care (Signed)
  Problem: Coping: Goal: Level of anxiety will decrease Outcome: Progressing   Problem: Pain Managment: Goal: General experience of comfort will improve Outcome: Progressing   Problem: Skin Integrity: Goal: Risk for impaired skin integrity will decrease Outcome: Progressing   Problem: Metabolic: Goal: Ability to maintain appropriate glucose levels will improve Outcome: Progressing   Problem: Tissue Perfusion: Goal: Adequacy of tissue perfusion will improve Outcome: Progressing   Problem: Education: Goal: Knowledge of General Education information will improve Description: Including pain rating scale, medication(s)/side effects and non-pharmacologic comfort measures Outcome: Not Progressing   Problem: Health Behavior/Discharge Planning: Goal: Ability to manage health-related needs will improve Outcome: Not Progressing   Problem: Clinical Measurements: Goal: Ability to maintain clinical measurements within normal limits will improve Outcome: Not Progressing Goal: Will remain free from infection Outcome: Not Progressing Goal: Diagnostic test results will improve Outcome: Not Progressing Goal: Respiratory complications will improve Outcome: Not Progressing Goal: Cardiovascular complication will be avoided Outcome: Not Progressing   Problem: Activity: Goal: Risk for activity intolerance will decrease Outcome: Not Progressing   Problem: Nutrition: Goal: Adequate nutrition will be maintained Outcome: Not Progressing   Problem: Elimination: Goal: Will not experience complications related to bowel motility Outcome: Not Progressing Goal: Will not experience complications related to urinary retention Outcome: Not Progressing   Problem: Safety: Goal: Ability to remain free from injury will improve Outcome: Not Progressing   Problem: Education: Goal: Ability to describe self-care measures that may prevent or decrease complications (Diabetes Survival Skills Education)  will improve Outcome: Not Progressing Goal: Individualized Educational Video(s) Outcome: Not Progressing   Problem: Coping: Goal: Ability to adjust to condition or change in health will improve Outcome: Not Progressing   Problem: Fluid Volume: Goal: Ability to maintain a balanced intake and output will improve Outcome: Not Progressing   Problem: Health Behavior/Discharge Planning: Goal: Ability to identify and utilize available resources and services will improve Outcome: Not Progressing Goal: Ability to manage health-related needs will improve Outcome: Not Progressing   Problem: Nutritional: Goal: Maintenance of adequate nutrition will improve Outcome: Not Progressing Goal: Progress toward achieving an optimal weight will improve Outcome: Not Progressing   Problem: Skin Integrity: Goal: Risk for impaired skin integrity will decrease Outcome: Not Progressing

## 2022-12-05 NOTE — TOC Progression Note (Signed)
Transition of Care [facility]) - Progression Note    Patient Details  Name: Lindsey Mcguire MRN: [id number] Date of Birth: 09-04-1934  Transition of [facility] CM/SW Contact   A [place], [place] Phone Number: 12/05/2022, 8:57 AM  Clinical Narrative:     CSW contacted pt's son, [name] [phone] to update on pt's disposition. There was no answer, CSW left voicemail.   CSW contacted pt's sister, [name]. She informed CSW that pt's family is having a funeral for pt's husband today and have been busy with arrangements and unable to reached until now.  She stated she or [name], primary point of contact, would reach back out to CSW's contact information that CSW provided when they had a moment.   TOC will continue to follow.      Barriers to Discharge: Continued Medical Work up, [employer], English as a second language teacher, SNF Pending [facility] [facility] Offer  Expected Discharge Plan and Services In-house Referral: Hospice / Palliative Care     Living arrangements for the past 2 months: Single Family Home                                       Social Determinants of Health (SDOH) Interventions SDOH Screenings   Food Insecurity: Patient Unable To Answer (11/28/2022)  Housing: High Risk (11/28/2022)  Transportation Needs: No Transportation Needs (11/28/2022)  Utilities: Patient Unable To Answer (11/28/2022)  Tobacco Use: Low Risk  (11/27/2022)  Health Literacy: High Risk (10/21/2022)   Received from [facility]    Readmission Risk Interventions     No data to display

## 2022-12-06 DIAGNOSIS — K5289 Other specified noninfective gastroenteritis and colitis: Secondary | ICD-10-CM | POA: Diagnosis not present

## 2022-12-06 LAB — GLUCOSE, CAPILLARY
Glucose-Capillary: 115 mg/dL — ABNORMAL HIGH (ref 70–99)
Glucose-Capillary: 126 mg/dL — ABNORMAL HIGH (ref 70–99)
Glucose-Capillary: 140 mg/dL — ABNORMAL HIGH (ref 70–99)
Glucose-Capillary: 145 mg/dL — ABNORMAL HIGH (ref 70–99)
Glucose-Capillary: 147 mg/dL — ABNORMAL HIGH (ref 70–99)
Glucose-Capillary: 166 mg/dL — ABNORMAL HIGH (ref 70–99)

## 2022-12-06 NOTE — Progress Notes (Signed)
PROGRESS NOTE SAMIKA WALDRIP  ZOX:[id number] DOB: 1935/01/24 DOA: 11/27/2022 PCP: Lorenda Ishihara, MD  Brief Narrative/Hospital Course: [phone] with hypertension, hyperlipidemia, hypothyroidism, dementia, HFpEF, type 2 diabetes mellitus, anemia, stage IV CKD, osteoporosis who had recently been discharged on 10/28/2022 from [facility] after being treated for dehydration, deconditioning, failure to thrive, weakness, sepsis from cystitis and was subsequently discharged to SNF and remained in therapy for 2 weeks subsequently discharged home.  Her husband recently passed away.  She lives with her son and grandson.  Family reported that she has been having diarrhea for the last several days and noticed some blood in her stool.  They brought her to the ED for further evaluation. Noted to be extremely debilitated,cachectic and had severe fecal impaction with a large stool ball which required disimpaction in the ED and stercoral colitis noted. She was also noted to be hypotensive and dehydrated and eventually required low-dose norepinephrine infusion to maintain blood pressure. She was started on antibiotic therapy and admission requested for further management. Patient was treated for sepsis due to UTI and stercoral colitis, transferred out of ICU 8/4. 8/4-noticed some blood per rectum heparin DVT prophylaxis discontinued CBC ordered and GI was consulted No plan for endoscopic intervention at this time GI has discussed with family consider flex sigmoidoscopy for recurrent bleeding, advised to continue Cort enema for 2 weeks  Subjective: Seen examined this morning resting comfortably she is alert awake oriented to self no agitation, no complaints  Per nursing report patient having bowel movement but no bleeding  Assessment and Plan: Principal Problem:   Stercoral colitis Active Problems:   Failure to thrive in adult   Type 2 diabetes mellitus with stage 4 chronic kidney disease, without long-term  current use of insulin (HCC)   Hypotension   (HFpEF) heart failure with preserved ejection fraction (HCC)   Hypertension   Dementia (HCC)   Hypothyroidism   Hyperlipidemia   Fecal impaction (HCC)   Chronic constipation   Chronic kidney disease (CKD), stage IV (severe) (HCC)   Hyperglycemia   UTI (urinary tract infection)   Rectal bleeding Stercoral colitis/proctitis with fecal impaction: Dilated and stool-filled rectum with wall thickening and surrounding inflammatory stranding on admission.s/p manual disimpaction by ED. Hemoglobin has overall been overall stable. GI has seen started on cortrenema 8/5>No plan for endoscopic intervention at this time, advised to continue Cortenema for 2 weeks.Monitor hb intermittently.  Due to diarrhea, changed MiraLAX from twice daily to PRN  Hypokalemia Resolved.  Hypotension due to dehydration  needing pressors in ICU UTI and Stercoral Colitis: Currently vital stable.  On admission CT with dilated and stool filled rectum with wall thickening and surrounding inflammatory stranding (concerning for stercoral colitis/proctitis with fecal impaction) >S/p disimpaction on admission.   Completed antibiotics 12/03/22. Unfortunately no urine or blood culture data   Hypoglycemic due to poor oral intake: D5 discontinued 8/7, blood sugar stable as below-continue to feed with assistance.   Recent Labs  Lab 12/05/22 1651 12/05/22 2015 12/06/22 0021 12/06/22 0545 12/06/22 0740  GLUCAP 119* 116* 166* 145* 147*    Acute Metabolic Encephalopathy: Dementia: Pleasantly confused  aaox1 at baseline with her dementia.Continue delirium precaution fall precaution  AKI on CKD IIIB: AKI improved as below, off ivf. Encourage PO w/ assistance Recent Labs    11/27/22 1836 11/27/22 1858 11/29/22 1144 11/30/22 0246 12/01/22 0421 12/02/22 0506 12/03/22 0625 12/04/22 0433 12/05/22 0920  BUN 55* 58* 26* 21 15 11 9  6* 7*  CREATININE 1.83* 1.90* 1.35* 1.32*  1.21*  1.09* 1.14* 1.03* 1.09*  CO2 23  --  24 23 23 24 24 23 22     Failure to thrive Severe protein calorie malnutrition Debility deconditioning: Debilitated, overall prognosis does not appear bright.  Continue supportive care status continue palliative outpatient   Hypothyroidism Cont synthroid  Anemia of chronic disease with chronic iron deficiency. Hemoglobin has been stable, trend Recent Labs    12/02/22 0506 12/02/22 1355 12/02/22 1815 12/03/22 0005 12/03/22 0625 12/04/22 0433 12/05/22 0920  HGB 10.2*   < > 8.9* 9.9* 10.3* 9.4* 9.0*  MCV 84.4  --   --   --   --   --  86.1   < > = values in this interval not displayed.   Goals of care: Palliative care has been following closely, DNR. PMT following up with patient's son to discuss future GOC Patient's son endorsed that they have and having goals of care discussion, developed continue treatment, given his DNR, she is high risk for decompensation, high risk for readmission.  Family at this time focusing on funeral for patient's husband, has been updated.  DVT prophylaxis: Place and maintain sequential compression device Start: 12/01/22 1809 SCDs Start: 11/28/22 0734 Code Status:   Code Status: DNR Family Communication: plan of care discussed with patient/I had updated patient's son [name] 8/5 Family had funeral for patient's husband on 8/8  Patient status is: Inpatient because of sepsis Level of care: Med-Surg   Dispo: The patient is from:Home            Anticipated disposition: SNF once bed available  Objective: Vitals last 24 hrs: Vitals:   12/05/22 2016 12/06/22 0542 12/06/22 0744 12/06/22 0900  BP: (!) 108/91 (!) 98/53 108/62   Pulse: (!) 101 90 (!) 112 81  Resp: 17 18    Temp: (!) 97.5 F (36.4 C) 98.8 F (37.1 C) 98.4 F (36.9 C)   TempSrc: Oral Oral    SpO2: 97% 100% 92%   Weight:  50.9 kg    Height:       Weight change:   Physical Examination: General exam: alert awake, oriented X1 HEENT:Oral mucosa moist,  Ear/Nose WNL grossly Respiratory system: Bilaterally clear BS,no use of accessory muscle Cardiovascular system: S1 & S2 +, No JVD. Gastrointestinal system: Abdomen soft,NT,ND, BS+ Nervous System: Alert, awake, moving all extremities,and following commands. Extremities: LE edema neg,distal peripheral pulses palpable and warm.  Skin: No rashes,no icterus. MSK: Normal muscle bulk,tone, power   Medications reviewed:  Scheduled Meds:  acidophilus  2 capsule Oral TID   atorvastatin  40 mg Oral Daily   Chlorhexidine Gluconate Cloth  6 each Topical Daily   feeding supplement  237 mL Oral BID BM   Gerhardt's butt cream   Topical BID   hydrocortisone  100 mg Rectal QHS   levothyroxine  88 mcg Oral Daily   mouth rinse  15 mL Mouth Rinse 4 times per day   pantoprazole  40 mg Oral Daily  Continuous Infusions:  sodium chloride     Diet Order             DIET DYS 3 Room service appropriate? Yes; Fluid consistency: Thin  Diet effective now                 No intake or output data in the 24 hours ending 12/06/22 1206  Net IO Since Admission: 11,095.88 mL [12/06/22 1206]  Wt Readings from Last 3 Encounters:  12/06/22 50.9 kg  11/27/20 66.6 kg  05/29/20 68.1 kg     Unresulted Labs (From admission, onward)    None     Data Reviewed: I have personally reviewed following labs and imaging studies CBC: Recent Labs  Lab 11/30/22 0246 12/01/22 0421 12/01/22 1906 12/02/22 0506 12/02/22 1355 12/02/22 1815 12/03/22 0005 12/03/22 0625 12/04/22 0433 12/05/22 0920  WBC 6.7 4.9 5.1 4.4  --   --   --   --   --  4.9  HGB 11.6* 11.2* 10.5* 10.2*   < > 8.9* 9.9* 10.3* 9.4* 9.0*  HCT 36.3 34.3* 32.8* 31.8*   < > 27.7* 30.5* 31.1* 29.4* 28.6*  MCV 87.9 83.9 84.8 84.4  --   --   --   --   --  86.1  PLT 245 254 258 235  --   --   --   --   --  157   < > = values in this interval not displayed.  Basic Metabolic Panel: Recent Labs  Lab 11/30/22 0246 12/01/22 0421 12/02/22 0506  12/03/22 0625 12/04/22 0433 12/05/22 0920  NA 136 138 138 137 137 138  K 3.8 3.3* 3.0* 4.0 3.4* 3.6  CL 106 105 103 102 100 109  CO2 23 23 24 24 23 22   GLUCOSE 172* 138* 90 139* 158* 98  BUN 21 15 11 9  6* 7*  CREATININE 1.32* 1.21* 1.09* 1.14* 1.03* 1.09*  CALCIUM 8.6* 8.1* 7.5* 7.1* 7.0* 6.9*  MG 1.6*  --   --   --   --   --   PHOS 1.7* 2.8 3.3  --   --   --   GFR: Estimated Creatinine Clearance: 26.6 mL/min (A) (by C-G formula based on SCr of 1.09 mg/dL (H)). Recent Labs  Lab 11/30/22 0246 12/01/22 0421 12/02/22 0506  ALBUMIN 2.7* 2.8* 2.5*   Recent Labs  Lab 12/05/22 1651 12/05/22 2015 12/06/22 0021 12/06/22 0545 12/06/22 0740  GLUCAP 119* 116* 166* 145* 147*   No results for input(s): "PROCALCITON", "LATICACIDVEN" in the last 168 hours.   Recent Results (from the past 240 hour(s))  MRSA Next Gen by PCR, Nasal     Status: None   Collection Time: 11/28/22  8:51 AM   Specimen: Nasal Mucosa; Nasal Swab  Result Value Ref Range Status   MRSA by PCR Next Gen NOT DETECTED NOT DETECTED Final    Comment: (NOTE) The GeneXpert MRSA Assay (FDA approved for NASAL specimens only), is one component of a comprehensive MRSA colonization surveillance program. It is not intended to diagnose MRSA infection nor to guide or monitor treatment for MRSA infections. Test performance is not FDA approved in patients less than 91 years old. Performed at [facility], [address], [place], [place] [address]   MRSA Next Gen by PCR, Nasal     Status: None   Collection Time: 11/28/22  3:09 PM   Specimen: Nasal Mucosa; Nasal Swab  Result Value Ref Range Status   MRSA by PCR Next Gen NOT DETECTED NOT DETECTED Final    Comment: (NOTE) The GeneXpert MRSA Assay (FDA approved for NASAL specimens only), is one component of a comprehensive MRSA colonization surveillance program. It is not intended to diagnose MRSA infection nor to guide or monitor treatment for MRSA infections. Test  performance is not FDA approved in patients less than 49 years old. Performed at [facility], [address], [place], [place] [address]   Antimicrobials: Anti-infectives (From admission, onward)    Start  Dose/Rate Route Frequency Ordered Stop   11/30/22 0838  cefadroxil (DURICEF) capsule 500 mg        500 mg Oral Daily at bedtime 11/30/22 0826 12/03/22 2215   11/29/22 2200  cefTRIAXone (ROCEPHIN) 1 g in sodium chloride 0.9 % 100 mL IVPB  Status:  Discontinued        1 g 200 mL/hr over 30 Minutes Intravenous Every 24 hours 11/29/22 1034 11/30/22 0843   11/28/22 2200  cefTRIAXone (ROCEPHIN) 1 g in sodium chloride 0.9 % 100 mL IVPB  Status:  Discontinued        1 g 200 mL/hr over 30 Minutes Intravenous Every 24 hours 11/28/22 0731 11/28/22 0931   11/28/22 2200  cefTRIAXone (ROCEPHIN) 2 g in sodium chloride 0.9 % 100 mL IVPB  Status:  Discontinued        2 g 200 mL/hr over 30 Minutes Intravenous Every 24 hours 11/28/22 0931 11/29/22 1034   11/28/22 1000  metroNIDAZOLE (FLAGYL) IVPB 500 mg        500 mg 100 mL/hr over 60 Minutes Intravenous Every 12 hours 11/28/22 0731 11/30/22 2205   11/27/22 2300  cefTRIAXone (ROCEPHIN) 1 g in sodium chloride 0.9 % 100 mL IVPB        1 g 200 mL/hr over 30 Minutes Intravenous  Once 11/27/22 2251 11/28/22 0240   11/27/22 2300  metroNIDAZOLE (FLAGYL) IVPB 500 mg        500 mg 100 mL/hr over 60 Minutes Intravenous  Once 11/27/22 2251 11/28/22 0157     Culture/Microbiology    Component Value Date/Time   SDES ASCITIC 02/08/2019 1034   SPECREQUEST BOTTLES DRAWN AEROBIC AND ANAEROBIC 10CC 02/08/2019 1034   CULT  02/08/2019 1034    NO GROWTH 5 DAYS Performed at [facility], [address], [place], [place] [address]    REPTSTATUS 02/13/2019 FINAL 02/08/2019 1034  Other culture-see note  Radiology Studies: No results found. LOS: 8 days  Lanae Boast, MD [facility]  12/06/2022, 12:06 PM

## 2022-12-06 NOTE — TOC Progression Note (Addendum)
Transition of Care [facility]) - Progression Note    Patient Details  Name: Lindsey Mcguire MRN: [id number] Date of Birth: 05-Nov-1934  Transition of Care [facility]) CM/SW Contact   [place], [place] Phone Number: 12/06/2022, 11:04 AM  Clinical Narrative:     Update 1722  CSW contacted pt's son, [name], who said that the family decided [facility] for SNF would be their selection. CSW will notify facility and get follow up with bed availability.   TOC will continue to follow.   CSW was contacted by pt's sister [name]. She said that pt's son, [name], should be at bedside today but unsure about what time.   CSW presented bed offers over the phone and she stated she would share information with family and discuss.   She stated that she would follow up with CSW on decision later today.   TOC will continue to follow.       Barriers to Discharge: Continued Medical Work up  Expected Discharge Plan and Services In-house Referral: Hospice / Palliative Care     Living arrangements for the past 2 months: Single Family Home                                       Social Determinants of Health (SDOH) Interventions SDOH Screenings   Food Insecurity: Patient Unable To Answer (11/28/2022)  Housing: High Risk (11/28/2022)  Transportation Needs: No Transportation Needs (11/28/2022)  Utilities: Patient Unable To Answer (11/28/2022)  Tobacco Use: Low Risk  (11/27/2022)  Health Literacy: High Risk (10/21/2022)   Received from [facility]    Readmission Risk Interventions     No data to display

## 2022-12-06 NOTE — TOC Progression Note (Signed)
Transition of Care [facility]) - Progression Note    Patient Details  Name: Lindsey Mcguire MRN: [id number] Date of Birth: 1934-08-10  Transition of Care [facility]) CM/SW Contact   A [place], [place] Phone Number: 12/06/2022, 5:22 PM  Clinical Narrative:     CSW spoke with pt's son,     Barriers to Discharge: Continued Medical Work up  Expected Discharge Plan and Services In-house Referral: Hospice / Palliative Care     Living arrangements for the past 2 months: Single Family Home                                       Social Determinants of Health (SDOH) Interventions SDOH Screenings   Food Insecurity: Patient Unable To Answer (11/28/2022)  Housing: High Risk (11/28/2022)  Transportation Needs: No Transportation Needs (11/28/2022)  Utilities: Patient Unable To Answer (11/28/2022)  Tobacco Use: Low Risk  (11/27/2022)  Health Literacy: High Risk (10/21/2022)   Received from [facility]    Readmission Risk Interventions     No data to display

## 2022-12-06 NOTE — Progress Notes (Signed)
Occupational Therapy Treatment Patient Details Name: Lindsey Mcguire Today's Date: 12/06/2022   History of present illness 88 year old female who had recently been discharged to SNF on 10/28/2022 from [facility] after being treated for dehydration, deconditioning, failure to thrive, weakness, sepsis from cystitis and remained in therapy for 2 weeks subsequently discharged home. She lives with her son and grandson. They noticed pt with blood in stools and brought pt to ED 11/28/22. Noted to have severe fecal impaction and disimpacted. + hypotensive and dehydrated. PMH hypertension, hyperlipidemia, hypothyroidism, dementia, HFpEF, type 2 diabetes mellitus, anemia, stage IV CKD, osteoporosis   OT comments  Pt with slow progress toward established OT goals. Challenging cognition, safety, mobility, and balance. Moving to EOB with mod multimodal cues for initiation and mod A this session. Able to static sit 10+ minutes. Observed to have liquid in mouth and requiring max multimodal cues and set-up as if "spit cup" to spit. Pt confused using comb as tool to remove cotton from pt's glove per her report. Unable to use comb appropriately and resistive to washing own face with Hand over hand but allowing therapist. STS 2x this session with max A; retropulsion noted but pt with greater initiation and able to stand ~5 seconds with mod A this session. Patient will benefit from continued inpatient follow up therapy, <3 hours/day       If plan is discharge home, recommend the following:  Two people to help with walking and/or transfers;A lot of help with bathing/dressing/bathroom;Two people to help with bathing/dressing/bathroom;Assistance with cooking/housework;Assistance with feeding;Direct supervision/assist for medications management;Direct supervision/assist for financial management;Assist for transportation;Help with stairs or ramp for entrance   Equipment Recommendations   Other (comment) (defer)    Recommendations for Other Services      Precautions / Restrictions Precautions Precautions: Fall Restrictions Weight Bearing Restrictions: No       Mobility Bed Mobility Overal bed mobility: Needs Assistance Bed Mobility: Supine to Sit, Sit to Supine, Rolling     Supine to sit: Mod assist Sit to supine: Max assist   General bed mobility comments: pt with poor sequencing and initiation, once movement toward EOB started pt able to asssit and eleavte trunk, pt able to maintain sitting EOB without external assist with feet dangling above floor. Max A to scoot out to EOB    Transfers Overall transfer level: Needs assistance Equipment used: 1 person hand held assist Transfers: Sit to/from Stand Sit to Stand: Max assist           General transfer comment: STS only. Pt with pooor initiation on two attempts.     Balance Overall balance assessment: Needs assistance Sitting-balance support: Single extremity supported, Feet unsupported Sitting balance-Leahy Scale: Fair Sitting balance - Comments: able to mainatin static sitting min guard A   Standing balance support: Single extremity supported Standing balance-Leahy Scale: Poor Standing balance comment: Standing 2x, significant retropulsion on initial attempt, but forward shifting weight and reaching to arm chair out in front of her progressing to mod A but poor posture                           ADL either performed or assessed with clinical judgement   ADL Overall ADL's : Needs assistance/impaired   Eating/Feeding Details (indicate cue type and reason): RN reporting NT fed pt earlier and on arrival, pt with liquid in mouth and observed to hold in mouth despite cues to spit out. With  max cues and set up of cup with paper towl in it, pt spitting out liquid. Pt seemed to be asking for snuff when provided cup to spit. Grooming: Therapist, nutritional;Total assistance;Sitting Grooming Details  (indicate cue type and reason): resistant initially then agreeable for therapist to wash pt's face, but refusing to perform herself. Would not receive toothbrush in hand and also turning head away from toothbrush                 Toilet Transfer: Maximal assistance Toilet Transfer Details (indicate cue type and reason): sts only this session                Extremity/Trunk Assessment Upper Extremity Assessment Upper Extremity Assessment: Generalized weakness   Lower Extremity Assessment Lower Extremity Assessment: Defer to PT evaluation        Vision   Vision Assessment?: Vision impaired- to be further tested in functional context   Perception     Praxis      Cognition Arousal: Alert Behavior During Therapy: Flat affect Overall Cognitive Status: No family/caregiver present to determine baseline cognitive functioning                                 General Comments: aroused on arrival and locating OT to verbal/auditory stimuly (knock) as OT entered room. Pt with max difficulty following commands and following one command with max verbal cues this session, otherwise needing max multimodal cues and guidance. Very confused; provided comb, taking therapist ahnd and rubbing it with it, reporting she is getting the cotton off.        Exercises Other Exercises Other Exercises: unable to follow commands for exercises despite max multimodal cues    Shoulder Instructions       General Comments VSS    Pertinent Vitals/ Pain       Pain Assessment Pain Assessment: Faces Faces Pain Scale: No hurt  Home Living                                          Prior Functioning/Environment              Frequency  Min 1X/week        Progress Toward Goals  OT Goals(current goals can now be found in the care plan section)  Progress towards OT goals: Progressing toward goals (slowly)  Acute Rehab OT Goals Patient Stated Goal:  unable OT Goal Formulation: With patient Time For Goal Achievement: 12/15/22 Potential to Achieve Goals: Fair ADL Goals Pt Will Perform Grooming: with mod assist;sitting Pt Will Perform Upper Body Bathing: with mod assist Pt Will Perform Lower Body Bathing: with mod assist;sitting/lateral leans;sit to/from stand Pt Will Transfer to Toilet: with mod assist;stand pivot transfer;bedside commode  Plan      Co-evaluation                 AM-PAC OT "6 Clicks" Daily Activity     Outcome Measure   Help from another person eating meals?: Total Help from another person taking care of personal grooming?: Total Help from another person toileting, which includes using toliet, bedpan, or urinal?: Total Help from another person bathing (including washing, rinsing, drying)?: Total Help from another person to put on and taking off regular upper body clothing?: Total Help from another person to put on and  taking off regular lower body clothing?: Total 6 Click Score: 6    End of Session    OT Visit Diagnosis: Unsteadiness on feet (R26.81);Muscle weakness (generalized) (M62.81);Other symptoms and signs involving cognitive function   Activity Tolerance Patient tolerated treatment well   Patient Left in bed;with call bell/phone within reach;with bed alarm set   Nurse Communication Mobility status;Other (comment) (odor; potential bowel movement soon)        Time: 8119-1478 OT Time Calculation (min): 26 min  Charges: OT General Charges $OT Visit: 1 Visit OT Treatments $Therapeutic Activity: 8-22 mins  Lindsey Mcguire, Lindsey Mcguire [facility] Acute Rehabilitation Office: [phone]   Lindsey Mcguire 12/06/2022, 12:39 PM

## 2022-12-06 NOTE — Plan of Care (Signed)

## 2022-12-07 DIAGNOSIS — K5289 Other specified noninfective gastroenteritis and colitis: Secondary | ICD-10-CM | POA: Diagnosis not present

## 2022-12-07 LAB — GLUCOSE, CAPILLARY
Glucose-Capillary: 118 mg/dL — ABNORMAL HIGH (ref 70–99)
Glucose-Capillary: 128 mg/dL — ABNORMAL HIGH (ref 70–99)
Glucose-Capillary: 141 mg/dL — ABNORMAL HIGH (ref 70–99)
Glucose-Capillary: 84 mg/dL (ref 70–99)

## 2022-12-07 NOTE — Plan of Care (Signed)

## 2022-12-07 NOTE — Progress Notes (Signed)
PROGRESS NOTE Lindsey Mcguire  PPI:[id number] DOB: Dec 23, 1934 DOA: 11/27/2022 PCP: Lorenda Ishihara, MD  Brief Narrative/Hospital Course: [phone] with hypertension, hyperlipidemia, hypothyroidism, dementia, HFpEF, type 2 diabetes mellitus, anemia, stage IV CKD, osteoporosis who had recently been discharged on 10/28/2022 from [facility] after being treated for dehydration, deconditioning, failure to thrive, weakness, sepsis from cystitis and was subsequently discharged to SNF and remained in therapy for 2 weeks subsequently discharged home.  Her husband recently passed away.  She lives with her son and grandson.  Family reported that she has been having diarrhea for the last several days and noticed some blood in her stool.  They brought her to the ED for further evaluation. Noted to be extremely debilitated,cachectic and had severe fecal impaction with a large stool ball which required disimpaction in the ED and stercoral colitis noted. She was also noted to be hypotensive and dehydrated and eventually required low-dose norepinephrine infusion to maintain blood pressure. She was started on antibiotic therapy and admission requested for further management. Patient was treated for sepsis due to UTI and stercoral colitis, transferred out of ICU 8/4. 8/4-noticed some blood per rectum heparin DVT prophylaxis discontinued CBC ordered and GI was consulted No plan for endoscopic intervention at this time GI has discussed with family consider flex sigmoidoscopy for recurrent bleeding, advised to continue Cort enema for 2 weeks  Subjective: Patient seen and examined.  She is alert awake oriented x 1 pleasantly confused Overnight afebrile BP soft 90s to low 100  Assessment and Plan: Principal Problem:   Stercoral colitis Active Problems:   Failure to thrive in adult   Type 2 diabetes mellitus with stage 4 chronic kidney disease, without long-term current use of insulin (HCC)   Hypotension   (HFpEF)  heart failure with preserved ejection fraction (HCC)   Hypertension   Dementia (HCC)   Hypothyroidism   Hyperlipidemia   Fecal impaction (HCC)   Chronic constipation   Chronic kidney disease (CKD), stage IV (severe) (HCC)   Hyperglycemia   UTI (urinary tract infection)   Rectal bleeding Stercoral colitis/proctitis with fecal impaction: Dilated and stool-filled rectum with wall thickening and surrounding inflammatory stranding on admission. s/p manual disimpaction by ED. Hemoglobin has been stable no recurrence of bleeding after starting Cortrenema 8/5>No plan for endoscopic intervention at this time, advised to continue Cortenema for 2 weeks. Due to diarrhea, changed MiraLAX from twice daily to PRN  Hypokalemia Resolved.  Hypotension due to dehydration  needing pressors in ICU UTI and Stercoral Colitis: Currently vital stable-although soft BP at times.  Asymptomatic. On admission CT with dilated and stool filled rectum with wall thickening and surrounding inflammatory stranding (concerning for stercoral colitis/proctitis with fecal impaction) >S/p disimpaction on admission.   Completed antibiotics 12/03/22. Unfortunately no urine or blood culture data   Hypoglycemic due to poor oral intake: D5 discontinued 8/7, blood sugar stable as below no recurrence of hypoglycemia, continue to feed with assistance.   Recent Labs  Lab 12/06/22 1540 12/06/22 1951 12/07/22 0003 12/07/22 0436 12/07/22 0944  GLUCAP 126* 140* 141* 128* 84    Acute Metabolic Encephalopathy: Dementia: Pleasantly confused  aaox1 at baseline with her dementia.Continue delirium precaution fall precaution  AKI on CKD IIIB: AKI improved as below, off ivf. Encourage PO w/ assistance Recent Labs    11/27/22 1836 11/27/22 1858 11/29/22 1144 11/30/22 0246 12/01/22 0421 12/02/22 0506 12/03/22 0625 12/04/22 0433 12/05/22 0920  BUN 55* 58* 26* 21 15 11 9  6* 7*  CREATININE 1.83* 1.90* 1.35*  1.32* 1.21* 1.09* 1.14*  1.03* 1.09*  CO2 23  --  24 23 23 24 24 23 22     Failure to thrive Severe protein calorie malnutrition Debility deconditioning: Debilitated, overall prognosis does not appear bright.  Continue supportive care status continue palliative outpatient   Hypothyroidism Cont synthroid  Anemia of chronic disease with chronic iron deficiency. Hemoglobin has been stable, trend Recent Labs    12/02/22 0506 12/02/22 1355 12/02/22 1815 12/03/22 0005 12/03/22 0625 12/04/22 0433 12/05/22 0920  HGB 10.2*   < > 8.9* 9.9* 10.3* 9.4* 9.0*  MCV 84.4  --   --   --   --   --  86.1   < > = values in this interval not displayed.   Goals of care: Palliative care has been following closely, DNR. PMT following up with patient's son to discuss future GOC Patient's son endorsed that they have and having goals of care discussion, developed continue treatment, given his DNR, she is high risk for decompensation, high risk for readmission.  Family at this time focusing on funeral for patient's husband, has been updated.  DVT prophylaxis: Place and maintain sequential compression device Start: 12/01/22 1809 SCDs Start: 11/28/22 0734 Code Status:   Code Status: DNR Family Communication: plan of care discussed with patient/I had updated patient's son [name] 8/5 Family had funeral for patient's husband on 8/8  Patient status is: Inpatient because of sepsis Level of care: Med-Surg   Dispo: The patient is from:Home            Anticipated disposition: SNF once bed available  Objective: Vitals last 24 hrs: Vitals:   12/06/22 0900 12/06/22 1539 12/06/22 1950 12/07/22 0912  BP:  (!) 98/55 116/65 119/88  Pulse: 81 79 88 (!) 50  Resp:  18 17 16   Temp:  98.2 F (36.8 C) (!) 97.5 F (36.4 C) 97.7 F (36.5 C)  TempSrc:  Oral  Axillary  SpO2:  95% 100% 90%  Weight:      Height:       Weight change:   Physical Examination: General exam: alert awake, orientedx1, calm and not agitated. HEENT:Oral mucosa moist,  Ear/Nose WNL grossly Respiratory system: Bilaterally clear BS,no use of accessory muscle Cardiovascular system: S1 & S2 +, No JVD. Gastrointestinal system: Abdomen soft,NT,ND, BS+ Nervous System: Alert, awake, moving all extremities,and following commands. Extremities: LE edema neg,distal peripheral pulses palpable and warm.  Skin: No rashes,no icterus. MSK: Normal muscle bulk,tone, power   Medications reviewed:  Scheduled Meds:  acidophilus  2 capsule Oral TID   atorvastatin  40 mg Oral Daily   Chlorhexidine Gluconate Cloth  6 each Topical Daily   feeding supplement  237 mL Oral BID BM   Gerhardt's butt cream   Topical BID   hydrocortisone  100 mg Rectal QHS   levothyroxine  88 mcg Oral Daily   mouth rinse  15 mL Mouth Rinse 4 times per day   pantoprazole  40 mg Oral Daily  Continuous Infusions:  sodium chloride     Diet Order             DIET DYS 3 Room service appropriate? Yes; Fluid consistency: Thin  Diet effective now                  Intake/Output Summary (Last 24 hours) at 12/07/2022 1148 Last data filed at 12/07/2022 1056 Gross per 24 hour  Intake 660 ml  Output --  Net 660 ml    Net IO  Since Admission: 11,755.88 mL [12/07/22 1148]  Wt Readings from Last 3 Encounters:  12/06/22 50.9 kg  11/27/20 66.6 kg  05/29/20 68.1 kg     Unresulted Labs (From admission, onward)    None     Data Reviewed: I have personally reviewed following labs and imaging studies CBC: Recent Labs  Lab 12/01/22 0421 12/01/22 1906 12/02/22 0506 12/02/22 1355 12/02/22 1815 12/03/22 0005 12/03/22 0625 12/04/22 0433 12/05/22 0920  WBC 4.9 5.1 4.4  --   --   --   --   --  4.9  HGB 11.2* 10.5* 10.2*   < > 8.9* 9.9* 10.3* 9.4* 9.0*  HCT 34.3* 32.8* 31.8*   < > 27.7* 30.5* 31.1* 29.4* 28.6*  MCV 83.9 84.8 84.4  --   --   --   --   --  86.1  PLT 254 258 235  --   --   --   --   --  157   < > = values in this interval not displayed.  Basic Metabolic Panel: Recent Labs  Lab  12/01/22 0421 12/02/22 0506 12/03/22 0625 12/04/22 0433 12/05/22 0920  NA 138 138 137 137 138  K 3.3* 3.0* 4.0 3.4* 3.6  CL 105 103 102 100 109  CO2 23 24 24 23 22   GLUCOSE 138* 90 139* 158* 98  BUN 15 11 9  6* 7*  CREATININE 1.21* 1.09* 1.14* 1.03* 1.09*  CALCIUM 8.1* 7.5* 7.1* 7.0* 6.9*  PHOS 2.8 3.3  --   --   --   GFR: Estimated Creatinine Clearance: 26.6 mL/min (A) (by C-G formula based on SCr of 1.09 mg/dL (H)). Recent Labs  Lab 12/01/22 0421 12/02/22 0506  ALBUMIN 2.8* 2.5*   Recent Labs  Lab 12/06/22 1540 12/06/22 1951 12/07/22 0003 12/07/22 0436 12/07/22 0944  GLUCAP 126* 140* 141* 128* 84   No results for input(s): "PROCALCITON", "LATICACIDVEN" in the last 168 hours.   Recent Results (from the past 240 hour(s))  MRSA Next Gen by PCR, Nasal     Status: None   Collection Time: 11/28/22  8:51 AM   Specimen: Nasal Mucosa; Nasal Swab  Result Value Ref Range Status   MRSA by PCR Next Gen NOT DETECTED NOT DETECTED Final    Comment: (NOTE) The GeneXpert MRSA Assay (FDA approved for NASAL specimens only), is one component of a comprehensive MRSA colonization surveillance program. It is not intended to diagnose MRSA infection nor to guide or monitor treatment for MRSA infections. Test performance is not FDA approved in patients less than 108 years old. Performed at [facility], [address], [place], [place] [address]   MRSA Next Gen by PCR, Nasal     Status: None   Collection Time: 11/28/22  3:09 PM   Specimen: Nasal Mucosa; Nasal Swab  Result Value Ref Range Status   MRSA by PCR Next Gen NOT DETECTED NOT DETECTED Final    Comment: (NOTE) The GeneXpert MRSA Assay (FDA approved for NASAL specimens only), is one component of a comprehensive MRSA colonization surveillance program. It is not intended to diagnose MRSA infection nor to guide or monitor treatment for MRSA infections. Test performance is not FDA approved in patients less than 51  years old. Performed at [facility], [address], [place], [place] [address]   Antimicrobials: Anti-infectives (From admission, onward)    Start     Dose/Rate Route Frequency Ordered Stop   11/30/22 0838  cefadroxil (DURICEF) capsule 500 mg  500 mg Oral Daily at bedtime 11/30/22 0826 12/03/22 2215   11/29/22 2200  cefTRIAXone (ROCEPHIN) 1 g in sodium chloride 0.9 % 100 mL IVPB  Status:  Discontinued        1 g 200 mL/hr over 30 Minutes Intravenous Every 24 hours 11/29/22 1034 11/30/22 0843   11/28/22 2200  cefTRIAXone (ROCEPHIN) 1 g in sodium chloride 0.9 % 100 mL IVPB  Status:  Discontinued        1 g 200 mL/hr over 30 Minutes Intravenous Every 24 hours 11/28/22 0731 11/28/22 0931   11/28/22 2200  cefTRIAXone (ROCEPHIN) 2 g in sodium chloride 0.9 % 100 mL IVPB  Status:  Discontinued        2 g 200 mL/hr over 30 Minutes Intravenous Every 24 hours 11/28/22 0931 11/29/22 1034   11/28/22 1000  metroNIDAZOLE (FLAGYL) IVPB 500 mg        500 mg 100 mL/hr over 60 Minutes Intravenous Every 12 hours 11/28/22 0731 11/30/22 2205   11/27/22 2300  cefTRIAXone (ROCEPHIN) 1 g in sodium chloride 0.9 % 100 mL IVPB        1 g 200 mL/hr over 30 Minutes Intravenous  Once 11/27/22 2251 11/28/22 0240   11/27/22 2300  metroNIDAZOLE (FLAGYL) IVPB 500 mg        500 mg 100 mL/hr over 60 Minutes Intravenous  Once 11/27/22 2251 11/28/22 0157     Culture/Microbiology    Component Value Date/Time   SDES ASCITIC 02/08/2019 1034   SPECREQUEST BOTTLES DRAWN AEROBIC AND ANAEROBIC 10CC 02/08/2019 1034   CULT  02/08/2019 1034    NO GROWTH 5 DAYS Performed at [facility], [address], [place], [place] [address]    REPTSTATUS 02/13/2019 FINAL 02/08/2019 1034  Other culture-see note  Radiology Studies: No results found. LOS: 9 days  Lanae Boast, MD [facility]  12/07/2022, 11:48 AM

## 2022-12-08 DIAGNOSIS — K5289 Other specified noninfective gastroenteritis and colitis: Secondary | ICD-10-CM | POA: Diagnosis not present

## 2022-12-08 LAB — GLUCOSE, CAPILLARY
Glucose-Capillary: 116 mg/dL — ABNORMAL HIGH (ref 70–99)
Glucose-Capillary: 127 mg/dL — ABNORMAL HIGH (ref 70–99)
Glucose-Capillary: 132 mg/dL — ABNORMAL HIGH (ref 70–99)
Glucose-Capillary: 152 mg/dL — ABNORMAL HIGH (ref 70–99)
Glucose-Capillary: 164 mg/dL — ABNORMAL HIGH (ref 70–99)
Glucose-Capillary: 94 mg/dL (ref 70–99)

## 2022-12-08 NOTE — Progress Notes (Signed)
PROGRESS NOTE Lindsey Mcguire  NFA:[id number] DOB: 07/13/1934 DOA: 11/27/2022 PCP: Lindsey Ishihara, MD  Brief Narrative/Hospital Course: [phone] with hypertension, hyperlipidemia, hypothyroidism, dementia, HFpEF, type 2 diabetes mellitus, anemia, stage IV CKD, osteoporosis who had recently been discharged on 10/28/2022 from [facility] after being treated for dehydration, deconditioning, failure to thrive, weakness, sepsis from cystitis and was subsequently discharged to SNF and remained in therapy for 2 weeks subsequently discharged home.  Her husband recently passed away.  She lives with her son and grandson.  Family reported that she has been having diarrhea for the last several days and noticed some blood in her stool.  They brought her to the ED for further evaluation. Noted to be extremely debilitated,cachectic and had severe fecal impaction with a large stool ball which required disimpaction in the ED and stercoral colitis noted. She was also noted to be hypotensive and dehydrated and eventually required low-dose norepinephrine infusion to maintain blood pressure. She was started on antibiotic therapy and admission requested for further management. Patient was treated for sepsis due to UTI and stercoral colitis, transferred out of ICU 8/4. 8/4-noticed some blood per rectum heparin DVT prophylaxis discontinued CBC ordered and GI was consulted No plan for endoscopic intervention at this time GI has discussed with family consider flex sigmoidoscopy for recurrent bleeding, advised to continue Cort enema for 2 weeks  Subjective: Patient seen and examined earlier this morning She is resting comfortably Overnight afebrile BP soft x 1 on recheck better   Assessment and Plan: Principal Problem:   Stercoral colitis Active Problems:   Failure to thrive in adult   Type 2 diabetes mellitus with stage 4 chronic kidney disease, without long-term current use of insulin (HCC)   Hypotension    (HFpEF) heart failure with preserved ejection fraction (HCC)   Hypertension   Dementia (HCC)   Hypothyroidism   Hyperlipidemia   Fecal impaction (HCC)   Chronic constipation   Chronic kidney disease (CKD), stage IV (severe) (HCC)   Hyperglycemia   UTI (urinary tract infection)   Rectal bleeding Stercoral colitis/proctitis with fecal impaction: Dilated and stool-filled rectum with wall thickening and surrounding inflammatory stranding on admission. s/p manual disimpaction by ED. Hemoglobin has been stable no recurrence of bleeding after starting Cortrenema 8/5>No plan for endoscopic intervention at this time, GI advised to continue Cortenema x 2 wks. miraLAX changed to as needed due to diarrhea.    Hypokalemia Resolved.  Hypotension due to dehydration  needing pressors in ICU UTI and Stercoral Colitis: Currently vital stable-although soft BP at times, is asymptomatic. On admission CT with dilated and stool filled rectum with wall thickening and surrounding inflammatory stranding (concerning for stercoral colitis/proctitis with fecal impaction) >S/p disimpaction on admission.  Completed antibiotics 12/03/22.   Hypoglycemic due to poor oral intake: D5 discontinued 8/7, blood sugar stable as below no recurrence of hypoglycemia. Cont to Feed with assistance.   Recent Labs  Lab 12/07/22 0436 12/07/22 0944 12/07/22 1756 12/08/22 0048 12/08/22 0521  GLUCAP 128* 84 118* 132* 94    Acute Metabolic Encephalopathy: Dementia: Pleasantly confused  aaox1 at baseline with her dementia. Continue delirium precaution fall precaution  AKI on CKD IIIB: AKI improved as below, off ivf. Encourage PO w/ assistance Recent Labs    11/27/22 1836 11/27/22 1858 11/29/22 1144 11/30/22 0246 12/01/22 0421 12/02/22 0506 12/03/22 0625 12/04/22 0433 12/05/22 0920  BUN 55* 58* 26* 21 15 11 9  6* 7*  CREATININE 1.83* 1.90* 1.35* 1.32* 1.21* 1.09* 1.14* 1.03* 1.09*  CO2 23  --  24 23 23 24 24 23 22      Failure to thrive Severe protein calorie malnutrition Debility deconditioning: Debilitated, overall prognosis does not appear bright.  Continue supportive care status continue palliative outpatient   Hypothyroidism Cont synthroid  Anemia of chronic disease with chronic iron deficiency. Hemoglobin has been stable, trend Recent Labs    12/02/22 0506 12/02/22 1355 12/02/22 1815 12/03/22 0005 12/03/22 0625 12/04/22 0433 12/05/22 0920  HGB 10.2*   < > 8.9* 9.9* 10.3* 9.4* 9.0*  MCV 84.4  --   --   --   --   --  86.1   < > = values in this interval not displayed.   Goals of care: Palliative care has been following closely, DNR. PMT following up with patient's son to discuss future GOC Patient's son endorsed that they have and having goals of care discussion, developed continue treatment, given his DNR, she is high risk for decompensation, high risk for readmission.  Family at this time focusing on funeral for patient's husband, has been updated.  DVT prophylaxis: Place and maintain sequential compression device Start: 12/01/22 1809 SCDs Start: 11/28/22 0734 Code Status:   Code Status: DNR Family Communication: plan of care discussed with patient/I had updated patient's son Lindsey Mcguire 8/5 Family had funeral for patient's husband on 8/8  Patient status is: Inpatient because of sepsis Level of care: Med-Surg   Dispo: The patient is from:Home            Anticipated disposition: SNF once bed available  Objective: Vitals last 24 hrs: Vitals:   12/07/22 2036 12/08/22 0332 12/08/22 0519 12/08/22 0933  BP: 119/89 (!) 86/68 (!) 110/57 (!) 121/58  Pulse: 87 87 82 82  Resp: 18   20  Temp: 98.3 F (36.8 C)   98.4 F (36.9 C)  TempSrc:    Axillary  SpO2: 97% 100%  94%  Weight:      Height:       Weight change:   Physical Examination: General exam: Sleeping/resting comfortably moving extremities  HEENT:Oral mucosa moist, Ear/Nose WNL grossly Respiratory system: Bilaterally clear BS,no  use of accessory muscle Cardiovascular system: S1 & S2 +, No JVD. Gastrointestinal system: Abdomen soft,NT,ND, BS+ Nervous System: sleeping, moving all extremities,and following commands. Extremities: LE edema neg,distal peripheral pulses palpable and warm.  Skin: No rashes,no icterus. MSK: Normal muscle bulk,tone, power   Medications reviewed:  Scheduled Meds:  acidophilus  2 capsule Oral TID   atorvastatin  40 mg Oral Daily   Chlorhexidine Gluconate Cloth  6 each Topical Daily   feeding supplement  237 mL Oral BID BM   Gerhardt's butt cream   Topical BID   hydrocortisone  100 mg Rectal QHS   levothyroxine  88 mcg Oral Daily   mouth rinse  15 mL Mouth Rinse 4 times per day   pantoprazole  40 mg Oral Daily  Continuous Infusions:  sodium chloride     Diet Order             DIET DYS 3 Room service appropriate? Yes; Fluid consistency: Thin  Diet effective now                  Intake/Output Summary (Last 24 hours) at 12/08/2022 1130 Last data filed at 12/08/2022 0844 Gross per 24 hour  Intake 240 ml  Output --  Net 240 ml    Net IO Since Admission: 11,995.88 mL [12/08/22 1130]  Wt Readings from Last 3 Encounters:  12/06/22 50.9 kg  11/27/20 66.6 kg  05/29/20 68.1 kg     Unresulted Labs (From admission, onward)    None     Data Reviewed: I have personally reviewed following labs and imaging studies CBC: Recent Labs  Lab 12/01/22 1906 12/02/22 0506 12/02/22 1355 12/02/22 1815 12/03/22 0005 12/03/22 0625 12/04/22 0433 12/05/22 0920  WBC 5.1 4.4  --   --   --   --   --  4.9  HGB 10.5* 10.2*   < > 8.9* 9.9* 10.3* 9.4* 9.0*  HCT 32.8* 31.8*   < > 27.7* 30.5* 31.1* 29.4* 28.6*  MCV 84.8 84.4  --   --   --   --   --  86.1  PLT 258 235  --   --   --   --   --  157   < > = values in this interval not displayed.  Basic Metabolic Panel: Recent Labs  Lab 12/02/22 0506 12/03/22 0625 12/04/22 0433 12/05/22 0920  NA 138 137 137 138  K 3.0* 4.0 3.4* 3.6  CL  103 102 100 109  CO2 24 24 23 22   GLUCOSE 90 139* 158* 98  BUN 11 9 6* 7*  CREATININE 1.09* 1.14* 1.03* 1.09*  CALCIUM 7.5* 7.1* 7.0* 6.9*  PHOS 3.3  --   --   --   GFR: Estimated Creatinine Clearance: 26.6 mL/min (A) (by C-G formula based on SCr of 1.09 mg/dL (H)). Recent Labs  Lab 12/02/22 0506  ALBUMIN 2.5*   Recent Labs  Lab 12/07/22 0436 12/07/22 0944 12/07/22 1756 12/08/22 0048 12/08/22 0521  GLUCAP 128* 84 118* 132* 94   No results for input(s): "PROCALCITON", "LATICACIDVEN" in the last 168 hours.   Recent Results (from the past 240 hour(s))  MRSA Next Gen by PCR, Nasal     Status: None   Collection Time: 11/28/22  3:09 PM   Specimen: Nasal Mucosa; Nasal Swab  Result Value Ref Range Status   MRSA by PCR Next Gen NOT DETECTED NOT DETECTED Final    Comment: (NOTE) The GeneXpert MRSA Assay (FDA approved for NASAL specimens only), is one component of a comprehensive MRSA colonization surveillance program. It is not intended to diagnose MRSA infection nor to guide or monitor treatment for MRSA infections. Test performance is not FDA approved in patients less than 27 years old. Performed at [facility], [address] [place], [place] [address]   Antimicrobials: Anti-infectives (From admission, onward)    Start     Dose/Rate Route Frequency Ordered Stop   11/30/22 0838  cefadroxil (DURICEF) capsule 500 mg        500 mg Oral Daily at bedtime 11/30/22 0826 12/03/22 2215   11/29/22 2200  cefTRIAXone (ROCEPHIN) 1 g in sodium chloride 0.9 % 100 mL IVPB  Status:  Discontinued        1 g 200 mL/hr over 30 Minutes Intravenous Every 24 hours 11/29/22 1034 11/30/22 0843   11/28/22 2200  cefTRIAXone (ROCEPHIN) 1 g in sodium chloride 0.9 % 100 mL IVPB  Status:  Discontinued        1 g 200 mL/hr over 30 Minutes Intravenous Every 24 hours 11/28/22 0731 11/28/22 0931   11/28/22 2200  cefTRIAXone (ROCEPHIN) 2 g in sodium chloride 0.9 % 100 mL IVPB  Status:  Discontinued         2 g 200 mL/hr over 30 Minutes Intravenous Every 24 hours 11/28/22 0931 11/29/22 1034   11/28/22  1000  metroNIDAZOLE (FLAGYL) IVPB 500 mg        500 mg 100 mL/hr over 60 Minutes Intravenous Every 12 hours 11/28/22 0731 11/30/22 2205   11/27/22 2300  cefTRIAXone (ROCEPHIN) 1 g in sodium chloride 0.9 % 100 mL IVPB        1 g 200 mL/hr over 30 Minutes Intravenous  Once 11/27/22 2251 11/28/22 0240   11/27/22 2300  metroNIDAZOLE (FLAGYL) IVPB 500 mg        500 mg 100 mL/hr over 60 Minutes Intravenous  Once 11/27/22 2251 11/28/22 0157     Culture/Microbiology    Component Value Date/Time   SDES ASCITIC 02/08/2019 1034   SPECREQUEST BOTTLES DRAWN AEROBIC AND ANAEROBIC 10CC 02/08/2019 1034   CULT  02/08/2019 1034    NO GROWTH 5 DAYS Performed at [facility] [address], [place], [place] [address]    REPTSTATUS 02/13/2019 FINAL 02/08/2019 1034  Other culture-see note  Radiology Studies: No results found. LOS: 10 days  Lanae Boast, MD [facility]  12/08/2022, 11:30 AM

## 2022-12-08 NOTE — Plan of Care (Signed)

## 2022-12-09 DIAGNOSIS — K5289 Other specified noninfective gastroenteritis and colitis: Secondary | ICD-10-CM | POA: Diagnosis not present

## 2022-12-09 LAB — GLUCOSE, CAPILLARY
Glucose-Capillary: 112 mg/dL — ABNORMAL HIGH (ref 70–99)
Glucose-Capillary: 133 mg/dL — ABNORMAL HIGH (ref 70–99)
Glucose-Capillary: 118 mg/dL — ABNORMAL HIGH (ref 70–99)
Glucose-Capillary: 94 mg/dL (ref 70–99)
Glucose-Capillary: 132 mg/dL — ABNORMAL HIGH (ref 70–99)
Glucose-Capillary: 103 mg/dL — ABNORMAL HIGH (ref 70–99)
Glucose-Capillary: 136 mg/dL — ABNORMAL HIGH (ref 70–99)

## 2022-12-09 NOTE — Progress Notes (Signed)
PROGRESS NOTE Lindsey Mcguire  WUJ:[id number] DOB: Sep 01, 1934 DOA: 11/27/2022 PCP: Lorenda Ishihara, MD  Brief Narrative/Hospital Course: [phone] with hypertension, hyperlipidemia, hypothyroidism, dementia, HFpEF, type 2 diabetes mellitus, anemia, stage IV CKD, osteoporosis who had recently been discharged on 10/28/2022 from [facility] after being treated for dehydration, deconditioning, failure to thrive, weakness, sepsis from cystitis and was subsequently discharged to SNF and remained in therapy for 2 weeks subsequently discharged home.  Her husband recently passed away.  She lives with her son and grandson.  Family reported that she has been having diarrhea for the last several days and noticed some blood in her stool.  They brought her to the ED for further evaluation. Noted to be extremely debilitated,cachectic and had severe fecal impaction with a large stool ball which required disimpaction in the ED and stercoral colitis noted. She was also noted to be hypotensive and dehydrated and eventually required low-dose norepinephrine infusion to maintain blood pressure. She was started on antibiotic therapy and admission requested for further management. Patient was treated for sepsis due to UTI and stercoral colitis, transferred out of ICU 8/4. 8/4-noticed some blood per rectum heparin DVT prophylaxis discontinued CBC ordered and GI was consulted No plan for endoscopic intervention at this time GI has discussed with family consider flex sigmoidoscopy for recurrent bleeding, advised to continue Cort enema for 2 weeks  Subjective: Patient seen and examined this morning  No acute events overnight BP stable she is alert awake oriented to self.    Assessment and Plan: Principal Problem:   Stercoral colitis Active Problems:   Failure to thrive in adult   Type 2 diabetes mellitus with stage 4 chronic kidney disease, without long-term current use of insulin (HCC)   Hypotension   (HFpEF) heart  failure with preserved ejection fraction (HCC)   Hypertension   Dementia (HCC)   Hypothyroidism   Hyperlipidemia   Fecal impaction (HCC)   Chronic constipation   Chronic kidney disease (CKD), stage IV (severe) (HCC)   Hyperglycemia   UTI (urinary tract infection)   Rectal bleeding Stercoral colitis/proctitis with fecal impaction: Dilated and stool-filled rectum with wall thickening and surrounding inflammatory stranding on admission. s/p manual disimpaction by ED. Hemoglobin has been stable no recurrence of bleeding after starting Cortrenema 8/5>No plan for endoscopic intervention at this time, GI advised to continue Cortenema x 2 wks. miraLAX changed to as needed due to diarrhea.    Hypokalemia Resolved.  Hypotension due to dehydration  needing pressors in ICU UTI and Stercoral Colitis: Currently vital stable-although soft BP at times, is asymptomatic. On admission CT with dilated and stool filled rectum with wall thickening and surrounding inflammatory stranding (concerning for stercoral colitis/proctitis with fecal impaction) >S/p disimpaction on admission.  Completed antibiotics 12/03/22.   Hypoglycemic due to poor oral intake: D5 discontinued 8/7, blood sugar stable as below no recurrence of hypoglycemia. Cont to Feed with assistance.   Recent Labs  Lab 12/08/22 0521 12/08/22 1206 12/08/22 1925 12/08/22 2006 12/08/22 2334  GLUCAP 94 116* 164* 152* 127*    Acute Metabolic Encephalopathy: Dementia: Pleasantly confused  aaox1 at baseline with her dementia. Continue delirium precaution fall precaution  AKI on CKD IIIB: AKI improved as below, off ivf. Encourage PO w/ assistance Recent Labs    11/27/22 1836 11/27/22 1858 11/29/22 1144 11/30/22 0246 12/01/22 0421 12/02/22 0506 12/03/22 0625 12/04/22 0433 12/05/22 0920  BUN 55* 58* 26* 21 15 11 9  6* 7*  CREATININE 1.83* 1.90* 1.35* 1.32* 1.21* 1.09* 1.14* 1.03* 1.09*  CO2 23  --  24 23 23 24 24 23 22     Failure to  thrive Severe protein calorie malnutrition Debility deconditioning: Debilitated, overall prognosis does not appear bright.  Continue supportive care status continue palliative outpatient   Hypothyroidism Cont synthroid  Anemia of chronic disease with chronic iron deficiency. Hemoglobin has been stable, trend Recent Labs    12/02/22 0506 12/02/22 1355 12/02/22 1815 12/03/22 0005 12/03/22 0625 12/04/22 0433 12/05/22 0920  HGB 10.2*   < > 8.9* 9.9* 10.3* 9.4* 9.0*  MCV 84.4  --   --   --   --   --  86.1   < > = values in this interval not displayed.   Goals of care: Palliative care has been following closely, DNR. PMT following up with patient's son to discuss future GOC Patient's son endorsed that they have and having goals of care discussion, developed continue treatment, given his DNR, she is high risk for decompensation, high risk for readmission.  Family at this time focusing on funeral for patient's husband, has been updated.  DVT prophylaxis: Place and maintain sequential compression device Start: 12/01/22 1809 SCDs Start: 11/28/22 0734 Code Status:   Code Status: DNR Family Communication: plan of care discussed with patient/I had updated patient's son [name] 8/5 Family had funeral for patient's husband on 8/8  Patient status is: Inpatient because of sepsis Level of care: Med-Surg   Dispo: The patient is from:Home            Anticipated disposition: SNF-waiting for approval for [facility] rehab for SNF  Objective: Vitals last 24 hrs: Vitals:   12/08/22 0933 12/08/22 1707 12/08/22 2003 12/09/22 0729  BP: (!) 121/58 120/60 103/64 (!) 121/48  Pulse: 82 78 81 79  Resp: 20 20 17 17   Temp: 98.4 F (36.9 C) 98.7 F (37.1 C) 98.1 F (36.7 C)   TempSrc: Axillary Axillary Oral   SpO2: 94% 98% 100% 99%  Weight:      Height:       Weight change:   Physical Examination: General exam: alert awake, orientedx1 HEENT:Oral mucosa moist, Ear/Nose WNL grossly Respiratory system:  Bilaterally clear BS,no use of accessory muscle Cardiovascular system: S1 & S2 +, No JVD. Gastrointestinal system: Abdomen soft,NT,ND, BS+ Nervous System: Alert, awake, moving all extremities,and following commands. Extremities: LE edema neg,distal peripheral pulses palpable and warm.  Skin: No rashes,no icterus. MSK: Normal muscle bulk,tone, power   Medications reviewed:  Scheduled Meds:  acidophilus  2 capsule Oral TID   atorvastatin  40 mg Oral Daily   Chlorhexidine Gluconate Cloth  6 each Topical Daily   feeding supplement  237 mL Oral BID BM   Gerhardt's butt cream   Topical BID   hydrocortisone  100 mg Rectal QHS   levothyroxine  88 mcg Oral Daily   mouth rinse  15 mL Mouth Rinse 4 times per day   pantoprazole  40 mg Oral Daily  Continuous Infusions:  sodium chloride     Diet Order             DIET DYS 3 Room service appropriate? Yes; Fluid consistency: Thin  Diet effective now                  Intake/Output Summary (Last 24 hours) at 12/09/2022 0812 Last data filed at 12/08/2022 0844 Gross per 24 hour  Intake 120 ml  Output --  Net 120 ml    Net IO Since Admission: 11,995.88 mL [12/09/22 0812]  Wt  Readings from Last 3 Encounters:  12/06/22 50.9 kg  11/27/20 66.6 kg  05/29/20 68.1 kg     Unresulted Labs (From admission, onward)    None     Data Reviewed: I have personally reviewed following labs and imaging studies CBC: Recent Labs  Lab 12/02/22 1815 12/03/22 0005 12/03/22 0625 12/04/22 0433 12/05/22 0920  WBC  --   --   --   --  4.9  HGB 8.9* 9.9* 10.3* 9.4* 9.0*  HCT 27.7* 30.5* 31.1* 29.4* 28.6*  MCV  --   --   --   --  86.1  PLT  --   --   --   --  157  Basic Metabolic Panel: Recent Labs  Lab 12/03/22 0625 12/04/22 0433 12/05/22 0920  NA 137 137 138  K 4.0 3.4* 3.6  CL 102 100 109  CO2 24 23 22   GLUCOSE 139* 158* 98  BUN 9 6* 7*  CREATININE 1.14* 1.03* 1.09*  CALCIUM 7.1* 7.0* 6.9*  GFR: Estimated Creatinine Clearance: 26.6  mL/min (A) (by C-G formula based on SCr of 1.09 mg/dL (H)). No results for input(s): "AST", "ALT", "ALKPHOS", "BILITOT", "PROT", "ALBUMIN" in the last 168 hours.  Recent Labs  Lab 12/08/22 0521 12/08/22 1206 12/08/22 1925 12/08/22 2006 12/08/22 2334  GLUCAP 94 116* 164* 152* 127*   No results for input(s): "PROCALCITON", "LATICACIDVEN" in the last 168 hours.   No results found for this or any previous visit (from the past 240 hour(s)). Antimicrobials: Anti-infectives (From admission, onward)    Start     Dose/Rate Route Frequency Ordered Stop   11/30/22 0838  cefadroxil (DURICEF) capsule 500 mg        500 mg Oral Daily at bedtime 11/30/22 0826 12/03/22 2215   11/29/22 2200  cefTRIAXone (ROCEPHIN) 1 g in sodium chloride 0.9 % 100 mL IVPB  Status:  Discontinued        1 g 200 mL/hr over 30 Minutes Intravenous Every 24 hours 11/29/22 1034 11/30/22 0843   11/28/22 2200  cefTRIAXone (ROCEPHIN) 1 g in sodium chloride 0.9 % 100 mL IVPB  Status:  Discontinued        1 g 200 mL/hr over 30 Minutes Intravenous Every 24 hours 11/28/22 0731 11/28/22 0931   11/28/22 2200  cefTRIAXone (ROCEPHIN) 2 g in sodium chloride 0.9 % 100 mL IVPB  Status:  Discontinued        2 g 200 mL/hr over 30 Minutes Intravenous Every 24 hours 11/28/22 0931 11/29/22 1034   11/28/22 1000  metroNIDAZOLE (FLAGYL) IVPB 500 mg        500 mg 100 mL/hr over 60 Minutes Intravenous Every 12 hours 11/28/22 0731 11/30/22 2205   11/27/22 2300  cefTRIAXone (ROCEPHIN) 1 g in sodium chloride 0.9 % 100 mL IVPB        1 g 200 mL/hr over 30 Minutes Intravenous  Once 11/27/22 2251 11/28/22 0240   11/27/22 2300  metroNIDAZOLE (FLAGYL) IVPB 500 mg        500 mg 100 mL/hr over 60 Minutes Intravenous  Once 11/27/22 2251 11/28/22 0157     Culture/Microbiology    Component Value Date/Time   SDES ASCITIC 02/08/2019 1034   SPECREQUEST BOTTLES DRAWN AEROBIC AND ANAEROBIC 10CC 02/08/2019 1034   CULT  02/08/2019 1034    NO GROWTH 5  DAYS Performed at [facility], [address] [place], [place] [address]    REPTSTATUS 02/13/2019 FINAL 02/08/2019 1034  Other culture-see note  Radiology  Studies: No results found. LOS: 11 days  Lanae Boast, MD [facility]  12/09/2022, 8:12 AM

## 2022-12-09 NOTE — Progress Notes (Signed)
Physical Therapy Treatment Patient Details Name: Lindsey Mcguire MRN: [id number] DOB: [birth year] Today's Date: 12/09/2022   History of Present Illness 88 year old female who had recently been discharged to SNF on 10/28/2022 from [facility] after being treated for dehydration, deconditioning, failure to thrive, weakness, sepsis from cystitis and remained in therapy for 2 weeks subsequently discharged home. She lives with her son and grandson. They noticed pt with blood in stools and brought pt to ED 11/28/22. Noted to have severe fecal impaction and disimpacted. + hypotensive and dehydrated. PMH hypertension, hyperlipidemia, hypothyroidism, dementia, HFpEF, type 2 diabetes mellitus, anemia, stage IV CKD, osteoporosis    PT Comments  Pt greeted resting in bed and agreeable to session with encouragement. Pt continues to require multimodal cues for all command following as pt with poor initiation and poor sequencing, however once mobility initiate pt with increased ability to complete and assist. Pt ultimately requiring mod-max A for bed mobility and max A +2 to come to standing as pt with continued retropulsion on initially stand, however with BLEs blocked pt able to shift weight anterior and achieve full upright standing. Pt demonstrating fair sitting balance this session with ability to maintain without UE support to complete ADLs. Current plan remains appropriate to address deficits and maximize functional independence and decrease caregiver burden. Pt continues to benefit from skilled PT services to progress toward functional mobility goals.       If plan is discharge home, recommend the following: Two people to help with walking and/or transfers;Assist for transportation;Assistance with cooking/housework;Help with stairs or ramp for entrance   Can travel by private vehicle     No  Equipment Recommendations  Wheelchair cushion (measurements PT);Wheelchair (measurements PT);Other  (comment);Hospital bed (hoyer lift)    Recommendations for Other Services       Precautions / Restrictions Precautions Precautions: Fall Restrictions Weight Bearing Restrictions: No     Mobility  Bed Mobility Overal bed mobility: Needs Assistance Bed Mobility: Supine to Sit, Sit to Supine, Rolling Rolling: Mod assist, +2 for physical assistance   Supine to sit: Mod assist Sit to supine: Max assist   General bed mobility comments: pt with poor sequencing and initiation, once movement toward EOB started pt able to asssit and eleavte trunk, pt able to maintain sitting EOB without external assist with feet dangling above floor. Max A to scoot out to EOB with pt needing support at back as pt with continued retropulsion initially    Transfers Overall transfer level: Needs assistance Equipment used: 2 person hand held assist Transfers: Sit to/from Stand Sit to Stand: Max assist           General transfer comment: Pt with pooor initiation on two attempts, with  BLEs block pt able to come to full upright standing    Ambulation/Gait               General Gait Details: unable to progress.   Stairs             Wheelchair Mobility     Tilt Bed    Modified Rankin (Stroke Patients Only)       Balance Overall balance assessment: Needs assistance Sitting-balance support: Single extremity supported, Feet unsupported Sitting balance-Leahy Scale: Fair Sitting balance - Comments: able to mainatin static sitting min guard A   Standing balance support: Single extremity supported Standing balance-Leahy Scale: Poor Standing balance comment: Standing 2x, significant retropulsion on initial attempt, but forward shifting weight on second attempt with BLEs blocked  Cognition Arousal: Alert Behavior During Therapy: Flat affect, Agitated Overall Cognitive Status: No family/caregiver present to determine baseline cognitive  functioning                                 General Comments: aroused on arrival Pt with max difficulty following commands, pt becoming aggitate with increased mobility        Exercises Other Exercises Other Exercises: unable to follow commands for exercises despite max multimodal cues    General Comments General comments (skin integrity, edema, etc.): VSS on RA      Pertinent Vitals/Pain Pain Assessment Pain Assessment: Faces Faces Pain Scale: Hurts little more Pain Location: generalized, bottom with peri-care Pain Descriptors / Indicators: Grimacing, Guarding Pain Intervention(s): Monitored during session, Limited activity within patient's tolerance, Repositioned    Home Living                          Prior Function            PT Goals (current goals can now be found in the care plan section) Acute Rehab PT Goals Patient Stated Goal: . PT Goal Formulation: Patient unable to participate in goal setting Time For Goal Achievement: 12/15/22 Progress towards PT goals: Progressing toward goals (slowly)    Frequency    Min 1X/week      PT Plan      Co-evaluation              AM-PAC PT "6 Clicks" Mobility   Outcome Measure  Help needed turning from your back to your side while in a flat bed without using bedrails?: Total Help needed moving from lying on your back to sitting on the side of a flat bed without using bedrails?: Total Help needed moving to and from a bed to a chair (including a wheelchair)?: Total Help needed standing up from a chair using your arms (e.g., wheelchair or bedside chair)?: Total Help needed to walk in hospital room?: Total Help needed climbing 3-5 steps with a railing? : Total 6 Click Score: 6    End of Session   Activity Tolerance: Other (comment);Patient limited by fatigue (pt limited by cognition) Patient left: in bed;with call bell/phone within reach;with bed alarm set Nurse Communication: Mobility  status PT Visit Diagnosis: Other abnormalities of gait and mobility (R26.89)     Time: 9563-8756 PT Time Calculation (min) (ACUTE ONLY): 10 min  Charges:    $Therapeutic Activity: 8-22 mins PT General Charges $$ ACUTE PT VISIT: 1 Visit                      R. PTA Acute Rehabilitation Services Office: [phone]   Catalina Antigua 12/09/2022, 12:34 PM

## 2022-12-09 NOTE — Plan of Care (Signed)
  Problem: Nutrition: Goal: Adequate nutrition will be maintained Outcome: Progressing   Problem: Coping: Goal: Level of anxiety will decrease Outcome: Progressing   Problem: Elimination: Goal: Will not experience complications related to bowel motility Outcome: Progressing   Problem: Pain Managment: Goal: General experience of comfort will improve Outcome: Progressing   

## 2022-12-09 NOTE — TOC Progression Note (Signed)
Transition of Care [facility]) - Progression Note    Patient Details  Name: Lindsey Mcguire MRN: [id number] Date of Birth: [birth year]  Transition of Care [facility]) CM/SW Contact   A [place], [place] Phone Number: 12/09/2022, 5:16 PM  Clinical Narrative:     Pt's auth still pending for insurance. TOC will continue to follow.      Barriers to Discharge: Continued Medical Work up  Expected Discharge Plan and Services In-house Referral: Hospice / Palliative Care     Living arrangements for the past 2 months: Single Family Home                                       Social Determinants of Health (SDOH) Interventions SDOH Screenings   Food Insecurity: Patient Unable To Answer (11/28/2022)  Housing: High Risk (11/28/2022)  Transportation Needs: No Transportation Needs (11/28/2022)  Utilities: Patient Unable To Answer (11/28/2022)  Tobacco Use: Low Risk  (11/27/2022)  Health Literacy: High Risk (10/21/2022)   Received from [facility]    Readmission Risk Interventions     No data to display

## 2022-12-09 NOTE — TOC Progression Note (Signed)
Transition of Care [facility]) - Progression Note    Patient Details  Name: Lindsey Mcguire MRN: [id number] Date of Birth: Apr 26, 1935  Transition of Care [facility]) CM/SW Contact   A [place], [place] Phone Number: 12/09/2022, 2:05 PM  Clinical Narrative:     [facility] started. Status pending for pt.   Vesta Mixer QM#[id number]   [facility] will continue to follow.     Barriers to Discharge: Continued Medical Work up  Expected Discharge Plan and Services In-house Referral: Hospice / Palliative Care     Living arrangements for the past 2 months: Single Family Home                                       Social Determinants of Health (SDOH) Interventions SDOH Screenings   Food Insecurity: Patient Unable To Answer (11/28/2022)  Housing: High Risk (11/28/2022)  Transportation Needs: No Transportation Needs (11/28/2022)  Utilities: Patient Unable To Answer (11/28/2022)  Tobacco Use: Low Risk  (11/27/2022)  Health Literacy: High Risk (10/21/2022)   Received from [facility]    Readmission Risk Interventions     No data to display

## 2022-12-10 DIAGNOSIS — K5289 Other specified noninfective gastroenteritis and colitis: Secondary | ICD-10-CM | POA: Diagnosis not present

## 2022-12-10 LAB — CBC
HCT: 28.6 % — ABNORMAL LOW (ref 36.0–46.0)
Hemoglobin: 9 g/dL — ABNORMAL LOW (ref 12.0–15.0)
MCH: 27.1 pg (ref 26.0–34.0)
MCHC: 31.5 g/dL (ref 30.0–36.0)
MCV: 86.1 fL (ref 80.0–100.0)
Platelets: 142 10*3/uL — ABNORMAL LOW (ref 150–400)
RBC: 3.32 MIL/uL — ABNORMAL LOW (ref 3.87–5.11)
RDW: 14.8 % (ref 11.5–15.5)
WBC: 4.5 10*3/uL (ref 4.0–10.5)
nRBC: 0 % (ref 0.0–0.2)

## 2022-12-10 LAB — BASIC METABOLIC PANEL
Anion gap: 9 (ref 5–15)
BUN: 15 mg/dL (ref 8–23)
CO2: 22 mmol/L (ref 22–32)
Calcium: 7.9 mg/dL — ABNORMAL LOW (ref 8.9–10.3)
Chloride: 103 mmol/L (ref 98–111)
Creatinine, Ser: 1.18 mg/dL — ABNORMAL HIGH (ref 0.44–1.00)
GFR, Estimated: 45 mL/min — ABNORMAL LOW (ref >60)
Glucose, Bld: 111 mg/dL — ABNORMAL HIGH (ref 70–99)
Potassium: 4.1 mmol/L (ref 3.5–5.1)
Sodium: 134 mmol/L — ABNORMAL LOW (ref 135–145)

## 2022-12-10 LAB — GLUCOSE, CAPILLARY
Glucose-Capillary: 100 mg/dL — ABNORMAL HIGH (ref 70–99)
Glucose-Capillary: 118 mg/dL — ABNORMAL HIGH (ref 70–99)
Glucose-Capillary: 129 mg/dL — ABNORMAL HIGH (ref 70–99)
Glucose-Capillary: 136 mg/dL — ABNORMAL HIGH (ref 70–99)
Glucose-Capillary: 97 mg/dL (ref 70–99)

## 2022-12-10 MED ORDER — POLYETHYLENE GLYCOL 3350 17 G PO PACK: 17.0000 g | PACK | Freq: Every day | ORAL | Status: DC | PRN

## 2022-12-10 MED ORDER — RISAQUAD PO CAPS: 2.0000 | ORAL_CAPSULE | Freq: Three times a day (TID) | ORAL | Status: DC

## 2022-12-10 MED ORDER — ADULT MULTIVITAMIN W/MINERALS CH
1.0000 | ORAL_TABLET | Freq: Every day | ORAL | Status: DC
  Administered 2022-12-10 – 2022-12-18 (×9): 1 via ORAL
  Filled 2022-12-10 (×9): qty 1

## 2022-12-10 MED ORDER — GERHARDT'S BUTT CREAM: 1.0000 | TOPICAL_CREAM | Freq: Two times a day (BID) | CUTANEOUS | Status: DC

## 2022-12-10 MED ORDER — HYDROCORTISONE 100 MG/60ML RE ENEM: 100.0000 mg | ENEMA | Freq: Every day | RECTAL | Status: DC

## 2022-12-10 MED ORDER — ENSURE ENLIVE PO LIQD: 237.0000 mL | Freq: Two times a day (BID) | ORAL | Status: DC

## 2022-12-10 MED ORDER — PANTOPRAZOLE SODIUM 40 MG PO TBEC: 40.0000 mg | DELAYED_RELEASE_TABLET | Freq: Every day | ORAL | Status: DC

## 2022-12-10 NOTE — Plan of Care (Signed)
  Problem: Activity: Goal: Risk for activity intolerance will decrease Outcome: Progressing   Problem: Nutrition: Goal: Adequate nutrition will be maintained Outcome: Progressing   Problem: Coping: Goal: Level of anxiety will decrease Outcome: Progressing   Problem: Elimination: Goal: Will not experience complications related to bowel motility Outcome: Progressing   

## 2022-12-10 NOTE — TOC Progression Note (Addendum)
Transition of Care [facility]) - Progression Note    Patient Details  Name: Lindsey Mcguire MRN: [id number] Date of Birth: 1935/04/10  Transition of Care [facility]) CM/SW Contact   A [place], [place] Phone Number: 12/10/2022, 9:37 AM  Clinical Narrative:     CSW to send updated PT notes as status is pending for pt's insurance.   CSW to reach out to PT to work with pt to assist with updated documentation.   TOC will continue to follow.     Barriers to Discharge: Continued Medical Work up  Expected Discharge Plan and Services In-house Referral: Hospice / Palliative Care     Living arrangements for the past 2 months: Single Family Home                                       Social Determinants of Health (SDOH) Interventions SDOH Screenings   Food Insecurity: Patient Unable To Answer (11/28/2022)  Housing: High Risk (11/28/2022)  Transportation Needs: No Transportation Needs (11/28/2022)  Utilities: Patient Unable To Answer (11/28/2022)  Tobacco Use: Low Risk  (11/27/2022)  Health Literacy: High Risk (10/21/2022)   Received from [facility]    Readmission Risk Interventions     No data to display

## 2022-12-10 NOTE — Progress Notes (Signed)
PROGRESS NOTE Lindsey Mcguire  NWG:[id number] DOB: 15-Nov-1934 DOA: 11/27/2022 PCP: Lorenda Ishihara, MD  Brief Narrative/Hospital Course: [id number] with hypertension, hyperlipidemia, hypothyroidism, dementia, HFpEF, type 2 diabetes mellitus, anemia, stage IV CKD, osteoporosis who had recently been discharged on 10/28/2022 from [facility] after being treated for dehydration, deconditioning, failure to thrive, weakness, sepsis from cystitis and was subsequently discharged to SNF and remained in therapy for 2 weeks subsequently discharged home.  Her husband recently passed away.  She lives with her son and grandson.  Family reported that she has been having diarrhea for the last several days and noticed some blood in her stool.  They brought her to the ED for further evaluation. Noted to be extremely debilitated,cachectic and had severe fecal impaction with a large stool ball which required disimpaction in the ED and stercoral colitis noted. She was also noted to be hypotensive and dehydrated and eventually required low-dose norepinephrine infusion to maintain blood pressure. She was started on antibiotic therapy and admission requested for further management. Patient was treated for sepsis due to UTI and stercoral colitis, transferred out of ICU 8/4. 8/4-noticed some blood per rectum heparin DVT prophylaxis discontinued CBC ordered and GI was consulted No plan for endoscopic intervention at this time GI has discussed with family consider flex sigmoidoscopy for recurrent bleeding, advised to continue Cort enema for 2 weeks  Subjective: Patient seen and examined this morning Resting comfortably alert awake oriented x 1 Overnight afebrile BP stable on room air SNF authorization is in process  Assessment and Plan: Principal Problem:   Stercoral colitis Active Problems:   Failure to thrive in adult   Type 2 diabetes mellitus with stage 4 chronic kidney disease, without long-term current use of  insulin (HCC)   Hypotension   (HFpEF) heart failure with preserved ejection fraction (HCC)   Hypertension   Dementia (HCC)   Hypothyroidism   Hyperlipidemia   Fecal impaction (HCC)   Chronic constipation   Chronic kidney disease (CKD), stage IV (severe) (HCC)   Hyperglycemia   UTI (urinary tract infection)   Rectal bleeding Stercoral colitis/proctitis with fecal impaction: Dilated and stool-filled rectum with wall thickening and surrounding inflammatory stranding on admission. s/p manual disimpaction by ED. No recurrence of bleeding after Cortrenema 8/5>No plan for endoscopic intervention at this time, GI advised to continue Cortenema x 2 wks. MiraLAX changed to as needed due to diarrhea.  Hemoglobin is stable in 9 g range. Recent Labs  Lab 12/04/22 0433 12/05/22 0920 12/10/22 0837  HGB 9.4* 9.0* 9.0*  HCT 29.4* 28.6* 28.6*     Hypokalemia Resolved.  Hypotension due to dehydration  needing pressors in ICU UTI and Stercoral Colitis: Currently vital stable-although soft BP at times, is asymptomatic. On admission CT with dilated and stool filled rectum with wall thickening and surrounding inflammatory stranding (concerning for stercoral colitis/proctitis with fecal impaction) >S/p disimpaction on admission.  Completed antibiotics 12/03/22.   Hypoglycemic due to poor oral intake: D5 discontinued 8/7, blood sugar stable as below no recurrence of hypoglycemia. Cont to Feed with assistance.   Recent Labs  Lab 12/09/22 1703 12/09/22 2104 12/10/22 0042 12/10/22 0449 12/10/22 0741  GLUCAP 103* 136* 97 100* 118*    Acute Metabolic Encephalopathy: Dementia: Pleasantly confused  aaox1 at baseline with her dementia. Continue delirium precaution fall precaution  AKI on CKD IIIB: AKI improved as below, off ivf. Encourage PO w/ assistance Recent Labs    11/27/22 1836 11/27/22 1858 11/29/22 1144 11/30/22 0246 12/01/22 0421 12/02/22 0506 12/03/22  3235 12/04/22 0433  12/05/22 0920 12/10/22 0837  BUN 55* 58* 26* 21 15 11 9  6* 7* 15  CREATININE 1.83* 1.90* 1.35* 1.32* 1.21* 1.09* 1.14* 1.03* 1.09* 1.18*  CO2 23  --  24 23 23 24 24 23 22 22     Failure to thrive Severe protein calorie malnutrition Debility deconditioning: Debilitated, overall prognosis does not appear bright.  Continue supportive care status continue palliative outpatient   Hypothyroidism Cont synthroid  Anemia of chronic disease with chronic iron deficiency. Hemoglobin has been stable, trend Recent Labs    12/03/22 0005 12/03/22 0625 12/04/22 0433 12/05/22 0920 12/10/22 0837  HGB 9.9* 10.3* 9.4* 9.0* 9.0*  MCV  --   --   --  86.1 86.1   Goals of care: Palliative care has been following closely, DNR. PMT following up with patient's son to discuss future GOC Patient's son endorsed that they have and having goals of care discussion, developed continue treatment, given his DNR, she is high risk for decompensation, high risk for readmission.Family  had Funeral for patient's husband few days ago  Plan for SNF  DVT prophylaxis: Place and maintain sequential compression device Start: 12/01/22 1809 SCDs Start: 11/28/22 0734 Code Status:   Code Status: DNR Family Communication: plan of care discussed with patient/I had updated patient's son [name] 8/5 Family had funeral for patient's husband on 8/8  Patient status is: Inpatient because of sepsis Level of care: Med-Surg   Dispo: The patient is from:Home            Anticipated disposition: SNF-waiting for approval for [facility] rehab for SNF  Objective: Vitals last 24 hrs: Vitals:   12/09/22 1703 12/09/22 2100 12/10/22 0606 12/10/22 0740  BP: 120/86 (!) 126/111 132/81 (!) 129/107  Pulse: 81 64 82 72  Resp: 18 18 18    Temp: 98 F (36.7 C) 98.2 F (36.8 C) 98.3 F (36.8 C) 98.1 F (36.7 C)  TempSrc:  Oral Oral Oral  SpO2: 98% (!) 70% 99% 100%  Weight:      Height:       Weight change:   Physical Examination: General exam:  alert awake, oriented X 1 HEENT:Oral mucosa moist, Ear/Nose WNL grossly Respiratory system: Bilaterally clear BS,no use of accessory muscle Cardiovascular system: S1 & S2 +, No JVD. Gastrointestinal system: Abdomen soft,NT,ND, BS+ Nervous System: Alert, awake, moving all extremities,and follows some commands. Extremities: LE edema neg,distal peripheral pulses palpable and warm.  Skin: No rashes,no icterus. MSK: Normal muscle bulk,tone, power   Medications reviewed:  Scheduled Meds:  acidophilus  2 capsule Oral TID   atorvastatin  40 mg Oral Daily   Chlorhexidine Gluconate Cloth  6 each Topical Daily   feeding supplement  237 mL Oral BID BM   Gerhardt's butt cream   Topical BID   hydrocortisone  100 mg Rectal QHS   levothyroxine  88 mcg Oral Daily   mouth rinse  15 mL Mouth Rinse 4 times per day   pantoprazole  40 mg Oral Daily  Continuous Infusions:  sodium chloride     Diet Order             DIET DYS 3 Room service appropriate? Yes; Fluid consistency: Thin  Diet effective now                  Intake/Output Summary (Last 24 hours) at 12/10/2022 1108 Last data filed at 12/10/2022 0907 Gross per 24 hour  Intake 360 ml  Output --  Net 360 ml  Net IO Since Admission: 12,555.88 mL [12/10/22 1108]  Wt Readings from Last 3 Encounters:  12/06/22 50.9 kg  11/27/20 66.6 kg  05/29/20 68.1 kg     Unresulted Labs (From admission, onward)    None     Data Reviewed: I have personally reviewed following labs and imaging studies CBC: Recent Labs  Lab 12/04/22 0433 12/05/22 0920 12/10/22 0837  WBC  --  4.9 4.5  HGB 9.4* 9.0* 9.0*  HCT 29.4* 28.6* 28.6*  MCV  --  86.1 86.1  PLT  --  157 142*  Basic Metabolic Panel: Recent Labs  Lab 12/04/22 0433 12/05/22 0920 12/10/22 0837  NA 137 138 134*  K 3.4* 3.6 4.1  CL 100 109 103  CO2 23 22 22   GLUCOSE 158* 98 111*  BUN 6* 7* 15  CREATININE 1.03* 1.09* 1.18*  CALCIUM 7.0* 6.9* 7.9*   No results found for this or  any previous visit (from the past 240 hour(s)). Antimicrobials: Anti-infectives (From admission, onward)    Start     Dose/Rate Route Frequency Ordered Stop   11/30/22 0838  cefadroxil (DURICEF) capsule 500 mg        500 mg Oral Daily at bedtime 11/30/22 0826 12/03/22 2215   11/29/22 2200  cefTRIAXone (ROCEPHIN) 1 g in sodium chloride 0.9 % 100 mL IVPB  Status:  Discontinued        1 g 200 mL/hr over 30 Minutes Intravenous Every 24 hours 11/29/22 1034 11/30/22 0843   11/28/22 2200  cefTRIAXone (ROCEPHIN) 1 g in sodium chloride 0.9 % 100 mL IVPB  Status:  Discontinued        1 g 200 mL/hr over 30 Minutes Intravenous Every 24 hours 11/28/22 0731 11/28/22 0931   11/28/22 2200  cefTRIAXone (ROCEPHIN) 2 g in sodium chloride 0.9 % 100 mL IVPB  Status:  Discontinued        2 g 200 mL/hr over 30 Minutes Intravenous Every 24 hours 11/28/22 0931 11/29/22 1034   11/28/22 1000  metroNIDAZOLE (FLAGYL) IVPB 500 mg        500 mg 100 mL/hr over 60 Minutes Intravenous Every 12 hours 11/28/22 0731 11/30/22 2205   11/27/22 2300  cefTRIAXone (ROCEPHIN) 1 g in sodium chloride 0.9 % 100 mL IVPB        1 g 200 mL/hr over 30 Minutes Intravenous  Once 11/27/22 2251 11/28/22 0240   11/27/22 2300  metroNIDAZOLE (FLAGYL) IVPB 500 mg        500 mg 100 mL/hr over 60 Minutes Intravenous  Once 11/27/22 2251 11/28/22 0157     Culture/Microbiology    Component Value Date/Time   SDES ASCITIC 02/08/2019 1034   SPECREQUEST BOTTLES DRAWN AEROBIC AND ANAEROBIC 10CC 02/08/2019 1034   CULT  02/08/2019 1034    NO GROWTH 5 DAYS Performed at [facility], [address] [place], [place] [address]    REPTSTATUS 02/13/2019 FINAL 02/08/2019 1034  Other culture-see note  Radiology Studies: No results found. LOS: 12 days  Lanae Boast, MD [facility]  12/10/2022, 11:08 AM

## 2022-12-10 NOTE — Progress Notes (Signed)
Initial Nutrition Assessment  DOCUMENTATION CODES:   Not applicable  INTERVENTION:  - Continue Ensure Enlive po BID, each supplement provides 350 kcal and 20 grams of protein.  - Add MVI q day.   NUTRITION DIAGNOSIS:   Inadequate oral intake related to lethargy/confusion as evidenced by meal completion < 50%.  GOAL:   Patient will meet greater than or equal to 90% of their needs  MONITOR:   PO intake, Supplement acceptance  REASON FOR ASSESSMENT:   Malnutrition Screening Tool    ASSESSMENT:   88 y.o. female admits related to rectal bleeding. PMH includes: anemia, dementia, DM, HTN. Pt is currently receiving medical management related to stercoral colitis.  Meds reviewed:  risaquad, lipitor. Labs reviewed: Na low.   Pt is currently disoriented x4. Pt is currently on a Dys 3 diet. Pt is planning to discharge to SNF once insurance is approved. Pt sleeping at time of assessment. Per record, pt ate 70% of her breakfast this am and has been eating 50-100% of her meals over the past several days. No family present at bedside to provide hx. Pt currently has Ensure BID ordered and per Meds Hx record, she has been accepting them. RD will continue to monitor PO intakes.   NUTRITION - FOCUSED PHYSICAL EXAM:  Pt sleeping and disoriented x4.   Diet Order:   Diet Order             Diet - low sodium heart healthy           DIET DYS 3 Room service appropriate? Yes; Fluid consistency: Thin  Diet effective now                   EDUCATION NEEDS:   Not appropriate for education at this time  Skin:  Skin Assessment: Reviewed RN Assessment  Last BM:  8/12 - type 6  Height:   Ht Readings from Last 1 Encounters:  11/28/22 5' (1.524 m)    Weight:   Wt Readings from Last 1 Encounters:  12/06/22 50.9 kg    Ideal Body Weight:     BMI:  Body mass index is 21.91 kg/m.  Estimated Nutritional Needs:   Kcal:  1300-1600 kcals  Protein:  65-80 gm  Fluid:  >/= 1.3  L  Bethann Humble, RD, LDN, CNSC.

## 2022-12-11 DIAGNOSIS — K5289 Other specified noninfective gastroenteritis and colitis: Secondary | ICD-10-CM | POA: Diagnosis not present

## 2022-12-11 LAB — GLUCOSE, CAPILLARY
Glucose-Capillary: 124 mg/dL — ABNORMAL HIGH (ref 70–99)
Glucose-Capillary: 131 mg/dL — ABNORMAL HIGH (ref 70–99)
Glucose-Capillary: 136 mg/dL — ABNORMAL HIGH (ref 70–99)
Glucose-Capillary: 141 mg/dL — ABNORMAL HIGH (ref 70–99)
Glucose-Capillary: 141 mg/dL — ABNORMAL HIGH (ref 70–99)
Glucose-Capillary: 169 mg/dL — ABNORMAL HIGH (ref 70–99)

## 2022-12-11 NOTE — Progress Notes (Signed)
Occupational Therapy Treatment Patient Details Name: Lindsey Mcguire MRN: [id number] DOB: 04-15-1935 Today's Date: 12/11/2022   History of present illness 88 year old female who had recently been discharged to SNF on 10/28/2022 from [facility] after being treated for dehydration, deconditioning, failure to thrive, weakness, sepsis from cystitis and remained in therapy for 2 weeks subsequently discharged home. She lives with her son and grandson. They noticed pt with blood in stools and brought pt to ED 11/28/22. Noted to have severe fecal impaction and disimpacted. + hypotensive and dehydrated. PMH hypertension, hyperlipidemia, hypothyroidism, dementia, HFpEF, type 2 diabetes mellitus, anemia, stage IV CKD, osteoporosis   OT comments  Pt progressing slowly toward established OT goals. Performing bed mobility with min-max A. Pt following ~20% of commands this session with multimodal cues. Washing face at EOB with CGA and max cues. Pt performing STS transfers with max A but greater initiation this session as compared to prior session. Pleasant and attempting to be conversational throughout. Will continue to follow. Patient will benefit from continued inpatient follow up therapy, <3 hours/day       If plan is discharge home, recommend the following:  Two people to help with walking and/or transfers;A lot of help with bathing/dressing/bathroom;Two people to help with bathing/dressing/bathroom;Assistance with cooking/housework;Assistance with feeding;Direct supervision/assist for medications management;Direct supervision/assist for financial management;Assist for transportation;Help with stairs or ramp for entrance   Equipment Recommendations  Other (comment) (defer)    Recommendations for Other Services      Precautions / Restrictions Precautions Precautions: Fall Restrictions Weight Bearing Restrictions: No       Mobility Bed Mobility Overal bed mobility: Needs Assistance Bed  Mobility: Supine to Sit, Sit to Supine     Supine to sit: Max assist, Min assist Sit to supine: Max assist   General bed mobility comments: Poor sequencing and initiation to come to EOB on command and needing assist for all parts. When first initiating back to supine, pt able to come back to EOB with min A for truncal elevation. Max A for return to supine as well as heavy cueing for sequencing.    Transfers Overall transfer level: Needs assistance Equipment used: 1 person hand held assist Transfers: Sit to/from Stand Sit to Stand: Max assist           General transfer comment: Greater initiation this session of STS with pt following command but only performing 1x, and ultimately max A for weight shift and rise.     Balance Overall balance assessment: Needs assistance Sitting-balance support: Single extremity supported, Feet unsupported Sitting balance-Leahy Scale: Fair Sitting balance - Comments: able to mainatin static sitting CGA with posterior lean Postural control: Posterior lean Standing balance support: Single extremity supported Standing balance-Leahy Scale: Poor Standing balance comment: one STS this session, reliant on therapist for balance                           ADL either performed or assessed with clinical judgement   ADL Overall ADL's : Needs assistance/impaired     Grooming: Wash/dry face;Contact guard assist;Sitting;Cueing for sequencing Grooming Details (indicate cue type and reason): Max cues to initiate               Lower Body Dressing Details (indicate cue type and reason): simultaneously lifted RLE with foot near EOB and pulled up R sock. unable to perform on command afterward Toilet Transfer: Maximal assistance Toilet Transfer Details (indicate cue type and reason): STS  General ADL Comments: following more commands this session. Assisting withSTS transfer, following command to wash face, and spit in cup 2x when  coughing up phlegm (RN aware)    Extremity/Trunk Assessment Upper Extremity Assessment Upper Extremity Assessment: Generalized weakness   Lower Extremity Assessment Lower Extremity Assessment: Defer to PT evaluation        Vision   Vision Assessment?: Vision impaired- to be further tested in functional context Additional Comments: Decreased visual attention as well as needing cues for scanning this session to recive items. Suspect decresaed peripheral vision as well   Perception     Praxis      Cognition Arousal: Alert Behavior During Therapy: Flat affect Overall Cognitive Status: History of cognitive impairments - at baseline                                 General Comments: Pt niece present on arrival and reports that she lives in [place] and is not around pt often but seemed to feel very natural meeting pt where she is cognitively. When asked if mentation is typically like this, not providing direct response.        Exercises      Shoulder Instructions       General Comments VSS on RA    Pertinent Vitals/ Pain       Pain Assessment Pain Assessment: Faces Faces Pain Scale: No hurt Pain Intervention(s): Monitored during session  Home Living                                          Prior Functioning/Environment              Frequency  Min 1X/week        Progress Toward Goals  OT Goals(current goals can now be found in the care plan section)  Progress towards OT goals: Progressing toward goals (goals extended due to slow progression)  Acute Rehab OT Goals Patient Stated Goal: unable OT Goal Formulation: With patient Time For Goal Achievement: 12/25/22 Potential to Achieve Goals: Fair ADL Goals Pt Will Perform Grooming: with mod assist;sitting Pt Will Perform Upper Body Bathing: with mod assist Pt Will Perform Lower Body Bathing: with mod assist;sitting/lateral leans;sit to/from stand Pt Will Transfer to Toilet:  with mod assist;stand pivot transfer;bedside commode  Plan      Co-evaluation                 AM-PAC OT "6 Clicks" Daily Activity     Outcome Measure   Help from another person eating meals?: Total Help from another person taking care of personal grooming?: A Lot Help from another person toileting, which includes using toliet, bedpan, or urinal?: Total Help from another person bathing (including washing, rinsing, drying)?: Total Help from another person to put on and taking off regular upper body clothing?: Total Help from another person to put on and taking off regular lower body clothing?: Total 6 Click Score: 7    End of Session    OT Visit Diagnosis: Unsteadiness on feet (R26.81);Muscle weakness (generalized) (M62.81);Other symptoms and signs involving cognitive function   Activity Tolerance Patient tolerated treatment well   Patient Left in bed;with call bell/phone within reach;with bed alarm set;with family/visitor present (niece, Inetta Fermo)   Nurse Communication Mobility status        Time:  1610-9604 OT Time Calculation (min): 22 min  Charges: OT General Charges $OT Visit: 1 Visit OT Treatments $Self Care/Home Management : 8-22 mins  Tyler Deis, OTR/L [facility] Acute Rehabilitation Office: [phone]   Myrla Halsted 12/11/2022, 4:56 PM

## 2022-12-11 NOTE — Progress Notes (Signed)
Physical Therapy Treatment Patient Details Name: Lindsey Mcguire MRN: [id number] DOB: [birth year] Today's Date: 12/11/2022   History of Present Illness 88 year old female who had recently been discharged to SNF on 10/28/2022 from [facility] after being treated for dehydration, deconditioning, failure to thrive, weakness, sepsis from cystitis and remained in therapy for 2 weeks subsequently discharged home. She lives with her son and grandson. They noticed pt with blood in stools and brought pt to ED 11/28/22. Noted to have severe fecal impaction and disimpacted. + hypotensive and dehydrated. PMH hypertension, hyperlipidemia, hypothyroidism, dementia, HFpEF, type 2 diabetes mellitus, anemia, stage IV CKD, osteoporosis    PT Comments  Pt greeted resting in bed, awake and alert, with slow progress towards acute goals this session. Pt moving to sitting EOB with max A as pt with poor initiation and sequencing, requiring assist to elevate trunk. Pt with continued retropulsion with increased trunk assist, however pt able to maintain reclined posture at EOB with feet dangling above floor with CGA. Pt resisting assist to scoot out to EOB to place feet on floor to progress transfers and returning to supine, needing light min A to bring Les into bed. Once supine pt able to come to long sitting with CGA to scoot toward HOB. Current plan remains appropriate to address deficits and maximize functional independence and decrease caregiver burden. Pt continues to benefit from skilled PT services to progress toward functional mobility goals.     If plan is discharge home, recommend the following: Two people to help with walking and/or transfers;Assist for transportation;Assistance with cooking/housework;Help with stairs or ramp for entrance   Can travel by private vehicle     No  Equipment Recommendations  Wheelchair cushion (measurements PT);Wheelchair (measurements PT);Other (comment);Hospital bed (hoyer lift)     Recommendations for Other Services       Precautions / Restrictions Precautions Precautions: Fall Restrictions Weight Bearing Restrictions: No     Mobility  Bed Mobility Overal bed mobility: Needs Assistance Bed Mobility: Supine to Sit, Sit to Supine, Rolling     Supine to sit: Max assist Sit to supine: Min assist   General bed mobility comments: pt with poor sequencing and initiation, once movement toward EOB started pt able to asssit to eleavte trunk, pt able to maintain sitting EOB without external assist with feet dangling above floor with posterior lean with pt resisting coming to upright posture and resisting assist to scoot out to EOB to place feet on floor, pt quickly lyinback to supine needing light min A to bring LEs fully into bed    Transfers                   General transfer comment: pt returning to supine with sitting up EOB and unable to redirect for trasnfers    Ambulation/Gait               General Gait Details: unable to progress.   Stairs             Wheelchair Mobility     Tilt Bed    Modified Rankin (Stroke Patients Only)       Balance Overall balance assessment: Needs assistance Sitting-balance support: Single extremity supported, Feet unsupported Sitting balance-Leahy Scale: Fair Sitting balance - Comments: able to mainatin static sitting CGA with posterior lean with pt resisting correcting with increased retropulsion with asssit to sit upright Postural control: Posterior lean  Cognition Arousal: Alert Behavior During Therapy: Flat affect, Agitated Overall Cognitive Status: No family/caregiver present to determine baseline cognitive functioning                                 General Comments: aroused on arrival Pt with max difficulty following commands, pt becoming aggitated with increased mobility        Exercises Other Exercises Other  Exercises: unable to follow commands for exercises despite max multimodal cues    General Comments General comments (skin integrity, edema, etc.): VSS on RA      Pertinent Vitals/Pain Pain Assessment Pain Assessment: Faces Faces Pain Scale: Hurts a little bit Pain Location: generalized Pain Descriptors / Indicators: Grimacing, Guarding Pain Intervention(s): Monitored during session, Limited activity within patient's tolerance    Home Living                          Prior Function            PT Goals (current goals can now be found in the care plan section) Acute Rehab PT Goals Patient Stated Goal: . PT Goal Formulation: Patient unable to participate in goal setting Time For Goal Achievement: 12/15/22 Progress towards PT goals: Not progressing toward goals - comment (impaired cognition)    Frequency    Min 1X/week      PT Plan      Co-evaluation              AM-PAC PT "6 Clicks" Mobility   Outcome Measure  Help needed turning from your back to your side while in a flat bed without using bedrails?: Total Help needed moving from lying on your back to sitting on the side of a flat bed without using bedrails?: Total Help needed moving to and from a bed to a chair (including a wheelchair)?: Total Help needed standing up from a chair using your arms (e.g., wheelchair or bedside chair)?: Total Help needed to walk in hospital room?: Total Help needed climbing 3-5 steps with a railing? : Total 6 Click Score: 6    End of Session   Activity Tolerance: Other (comment);Treatment limited secondary to agitation (pt limited by cognition) Patient left: in bed;with call bell/phone within reach;with bed alarm set Nurse Communication: Mobility status PT Visit Diagnosis: Other abnormalities of gait and mobility (R26.89)     Time: 1610-9604 PT Time Calculation (min) (ACUTE ONLY): 13 min  Charges:    $Therapeutic Activity: 8-22 mins PT General Charges $$  ACUTE PT VISIT: 1 Visit                      R. PTA Acute Rehabilitation Services Office: [phone]   Catalina Antigua 12/11/2022, 9:32 AM

## 2022-12-11 NOTE — Progress Notes (Signed)
PROGRESS NOTE Lindsey Mcguire  MVH:[id number] DOB: Feb 17, 1935 DOA: 11/27/2022 PCP: Lorenda Ishihara, MD  Brief Narrative/Hospital Course: [phone] with hypertension, hyperlipidemia, hypothyroidism, dementia, HFpEF, type 2 diabetes mellitus, anemia, stage IV CKD, osteoporosis who had recently been discharged on 10/28/2022 from [facility] after being treated for dehydration, deconditioning, failure to thrive, weakness, sepsis from cystitis and was subsequently discharged to SNF and remained in therapy for 2 weeks subsequently discharged home.  Her husband recently passed away.  She lives with her son and grandson.  Family reported that she has been having diarrhea for the last several days and noticed some blood in her stool.  They brought her to the ED for further evaluation. Noted to be extremely debilitated,cachectic and had severe fecal impaction with a large stool ball which required disimpaction in the ED and stercoral colitis noted. She was also noted to be hypotensive and dehydrated and eventually required low-dose norepinephrine infusion to maintain blood pressure. She was started on antibiotic therapy and admission requested for further management. Patient was treated for sepsis due to UTI and stercoral colitis, transferred out of ICU 8/4. 8/4-noticed some blood per rectum heparin DVT prophylaxis discontinued CBC ordered and GI was consulted No plan for endoscopic intervention at this time GI has discussed with family consider flex sigmoidoscopy for recurrent bleeding, advised to continue Cort enema for 2 weeks. Peer to peer done 12/11/22- per medical director from [facility], last PT note today reiewed>pt has been unable to progress with gait and not able to participate and resisting, medical director denied snf>? Consider temporary NH under custodial care then PTOT on medicare part B advised- once willing to participate then can try SN, if good support then [facility].  Subjective: Patient seen and  examined this morning  She is alert awake oriented to self only No new complaints  Eating meal this am Resistant  somewhat to allow physical exam but not agitated and able to examine her. Assessment and Plan: Principal Problem:   Stercoral colitis Active Problems:   Failure to thrive in adult   Type 2 diabetes mellitus with stage 4 chronic kidney disease, without long-term current use of insulin (HCC)   Hypotension   (HFpEF) heart failure with preserved ejection fraction (HCC)   Hypertension   Dementia (HCC)   Hypothyroidism   Hyperlipidemia   Fecal impaction (HCC)   Chronic constipation   Chronic kidney disease (CKD), stage IV (severe) (HCC)   Hyperglycemia   UTI (urinary tract infection)   Rectal bleeding Stercoral colitis/proctitis with fecal impaction: CT with dilated and stool filled rectum with wall thickening and surrounding inflammatory stranding (concerning for stercoral colitis/proctitis with fecal impaction) >S/p disimpaction on admission.  Completed antibiotics 12/03/22.  Seen by GI, PCCM.No recurrence of bleeding after Cortrenema 8/5>No plan for endoscopic intervention at this time, GI advised to continue Cortenema x 2 wks. Cont prm MiraLAX . Hb stable as below Recent Labs  Lab 12/05/22 0920 12/10/22 0837  HGB 9.0* 9.0*  HCT 28.6* 28.6*     Hypokalemia Resolved.  Hypotension due to dehydration needing pressors in ICU UTI Vitals at this time remains stable at times soft BP.  Asymptomatic.  Antibiotics completed  Hypoglycemic due to poor oral intake: D5 discontinued 8/7, blood sugar stable as below no recurrence of hypoglycemia. Cont to Feed with assistance.   Recent Labs  Lab 12/10/22 2127 12/11/22 0012 12/11/22 0625 12/11/22 0743 12/11/22 1137  GLUCAP 136* 169* 136* 124* 131*    Acute Metabolic Encephalopathy: Dementia: Pleasantly confused  aaox1 at baseline with her dementia.  Continue aspiration/delirium/fall precaution   AKI on CKD IIIB: AKI  improved as below, off ivf.  Feed with assistance, she is tolerating p.o.  Recent Labs    11/27/22 1836 11/27/22 1858 11/29/22 1144 11/30/22 0246 12/01/22 0421 12/02/22 0506 12/03/22 0625 12/04/22 0433 12/05/22 0920 12/10/22 0837  BUN 55* 58* 26* 21 15 11 9  6* 7* 15  CREATININE 1.83* 1.90* 1.35* 1.32* 1.21* 1.09* 1.14* 1.03* 1.09* 1.18*  CO2 23  --  24 23 23 24 24 23 22 22     Failure to thrive Severe protein calorie malnutrition Debility deconditioning: Debilitated,overall prognosis does not appear bright.Continue supportive care status continue palliative outpatient   Hypothyroidism Cont synthroid  Anemia of chronic disease with chronic iron deficiency. Hemoglobin has been stable, trend Recent Labs    12/03/22 0005 12/03/22 0625 12/04/22 0433 12/05/22 0920 12/10/22 0837  HGB 9.9* 10.3* 9.4* 9.0* 9.0*  MCV  --   --   --  86.1 86.1   Goals of care: Palliative care has been following closely, DNR. PMT following up with patient's son to discuss future GOC Patient's son endorsed that they have and having goals of care discussion.  Continue DNR, she is at high risk for decompensation and readmission but at this time is medically stable she is awaiting for placement.Plan for SNF  DVT prophylaxis: Place and maintain sequential compression device Start: 12/01/22 1809 SCDs Start: 11/28/22 0734 Code Status:   Code Status: DNR Family Communication: plan of care discussed with patient/I had updated patient's son [name] 8/5 Family had funeral for patient's husband on 8/8  Patient status is: Inpatient because of sepsis Level of care: Med-Surg   Dispo: The patient is from:Home            Anticipated disposition: SNF has been declined despite p.o. tPA review given that patient has not progressed with PT OT and not participating given her dementia.  TOC discussing with family for disposition  Objective: Vitals last 24 hrs: Vitals:   12/10/22 1918 12/10/22 2126 12/10/22 2206  12/11/22 0741  BP: (!) 138/103 (!) 139/120 (!) 135/92 (!) 140/82  Pulse: 70 72  72  Resp: 18 18  17   Temp: 98.5 F (36.9 C) (!) 97.4 F (36.3 C)    TempSrc:  Oral    SpO2: 100% 100%  100%  Weight:      Height:       Weight change:   Physical Examination: General exam: alert awake, oriented x1 HEENT:Oral mucosa moist, Ear/Nose WNL grossly Respiratory system: Bilaterally clear BS,no use of accessory muscle Cardiovascular system: S1 & S2 +, No JVD. Gastrointestinal system: Abdomen soft,NT,ND, BS+ Nervous System: Alert, awake, moving all extremities Extremities: LE edema neg,distal peripheral pulses palpable and warm.  Skin: No rashes,no icterus. MSK: Normal muscle bulk,tone, power    Medications reviewed:  Scheduled Meds:  acidophilus  2 capsule Oral TID   atorvastatin  40 mg Oral Daily   Chlorhexidine Gluconate Cloth  6 each Topical Daily   feeding supplement  237 mL Oral BID BM   Gerhardt's butt cream   Topical BID   hydrocortisone  100 mg Rectal QHS   levothyroxine  88 mcg Oral Daily   multivitamin with minerals  1 tablet Oral Daily   mouth rinse  15 mL Mouth Rinse 4 times per day   pantoprazole  40 mg Oral Daily  Continuous Infusions:  sodium chloride     Diet Order  Diet - low sodium heart healthy           DIET DYS 3 Room service appropriate? Yes; Fluid consistency: Thin  Diet effective now                 No intake or output data in the 24 hours ending 12/11/22 1141   Net IO Since Admission: 12,555.88 mL [12/11/22 1141]  Wt Readings from Last 3 Encounters:  12/06/22 50.9 kg  11/27/20 66.6 kg  05/29/20 68.1 kg     Unresulted Labs (From admission, onward)    None     Data Reviewed: I have personally reviewed following labs and imaging studies CBC: Recent Labs  Lab 12/05/22 0920 12/10/22 0837  WBC 4.9 4.5  HGB 9.0* 9.0*  HCT 28.6* 28.6*  MCV 86.1 86.1  PLT 157 142*  Basic Metabolic Panel: Recent Labs  Lab 12/05/22 0920  12/10/22 0837  NA 138 134*  K 3.6 4.1  CL 109 103  CO2 22 22  GLUCOSE 98 111*  BUN 7* 15  CREATININE 1.09* 1.18*  CALCIUM 6.9* 7.9*   No results found for this or any previous visit (from the past 240 hour(s)). Antimicrobials: Anti-infectives (From admission, onward)    Start     Dose/Rate Route Frequency Ordered Stop   11/30/22 0838  cefadroxil (DURICEF) capsule 500 mg        500 mg Oral Daily at bedtime 11/30/22 0826 12/03/22 2215   11/29/22 2200  cefTRIAXone (ROCEPHIN) 1 g in sodium chloride 0.9 % 100 mL IVPB  Status:  Discontinued        1 g 200 mL/hr over 30 Minutes Intravenous Every 24 hours 11/29/22 1034 11/30/22 0843   11/28/22 2200  cefTRIAXone (ROCEPHIN) 1 g in sodium chloride 0.9 % 100 mL IVPB  Status:  Discontinued        1 g 200 mL/hr over 30 Minutes Intravenous Every 24 hours 11/28/22 0731 11/28/22 0931   11/28/22 2200  cefTRIAXone (ROCEPHIN) 2 g in sodium chloride 0.9 % 100 mL IVPB  Status:  Discontinued        2 g 200 mL/hr over 30 Minutes Intravenous Every 24 hours 11/28/22 0931 11/29/22 1034   11/28/22 1000  metroNIDAZOLE (FLAGYL) IVPB 500 mg        500 mg 100 mL/hr over 60 Minutes Intravenous Every 12 hours 11/28/22 0731 11/30/22 2205   11/27/22 2300  cefTRIAXone (ROCEPHIN) 1 g in sodium chloride 0.9 % 100 mL IVPB        1 g 200 mL/hr over 30 Minutes Intravenous  Once 11/27/22 2251 11/28/22 0240   11/27/22 2300  metroNIDAZOLE (FLAGYL) IVPB 500 mg        500 mg 100 mL/hr over 60 Minutes Intravenous  Once 11/27/22 2251 11/28/22 0157     Culture/Microbiology    Component Value Date/Time   SDES ASCITIC 02/08/2019 1034   SPECREQUEST BOTTLES DRAWN AEROBIC AND ANAEROBIC 10CC 02/08/2019 1034   CULT  02/08/2019 1034    NO GROWTH 5 DAYS Performed at [facility], [address] [place], [place] [address]    REPTSTATUS 02/13/2019 FINAL 02/08/2019 1034  Other culture-see note  Radiology Studies: No results found. LOS: 13 days  Lanae Boast, MD [facility]  12/11/2022, 11:41 AM

## 2022-12-11 NOTE — TOC Progression Note (Signed)
Transition of Care [facility]) - Progression Note    Patient Details  Name: Lindsey Mcguire MRN: [id number] Date of Birth: 1934/08/03  Transition of Care [facility]) CM/SW Contact   A [place], [place] Phone Number: 12/11/2022, 1:36 PM  Clinical Narrative:     CSW contacted pt's son [name] with updated on pt's insurance denial and provided options for appeal or possible HH or private pay.  He stated he would talk with the family and reach back out to CSW with an answer for decision on next steps.   TOC  will continue to follow.      Barriers to Discharge: Continued Medical Work up  Expected Discharge Plan and Services In-house Referral: Hospice / Palliative Care     Living arrangements for the past 2 months: Single Family Home                                       Social Determinants of Health (SDOH) Interventions SDOH Screenings   Food Insecurity: Patient Unable To Answer (11/28/2022)  Housing: High Risk (11/28/2022)  Transportation Needs: No Transportation Needs (11/28/2022)  Utilities: Patient Unable To Answer (11/28/2022)  Tobacco Use: Low Risk  (11/27/2022)  Health Literacy: High Risk (10/21/2022)   Received from [facility]    Readmission Risk Interventions     No data to display

## 2022-12-11 NOTE — TOC Progression Note (Signed)
Transition of Care [facility]) - Progression Note    Patient Details  Name: Lindsey Mcguire MRN: [id number] Date of Birth: 07-Oct-1934  Transition of Care [facility]) CM/SW Contact   A [place], [place] Phone Number: 12/11/2022, 1:15 PM  Clinical Narrative:     Pt was offered peer 2 peer with provider to complete by Wednesday by 1200 @ [phone] opt#5.   Provider completed and notified TOC of denial.   Denial information: the Appeal Fast Track # [phone] Fax#[phone].   TOC will continue to follow.    Barriers to Discharge: Continued Medical Work up  Expected Discharge Plan and Services In-house Referral: Hospice / Palliative Care     Living arrangements for the past 2 months: Single Family Home                                       Social Determinants of Health (SDOH) Interventions SDOH Screenings   Food Insecurity: Patient Unable To Answer (11/28/2022)  Housing: High Risk (11/28/2022)  Transportation Needs: No Transportation Needs (11/28/2022)  Utilities: Patient Unable To Answer (11/28/2022)  Tobacco Use: Low Risk  (11/27/2022)  Health Literacy: High Risk (10/21/2022)   Received from [facility]    Readmission Risk Interventions     No data to display

## 2022-12-12 DIAGNOSIS — K5289 Other specified noninfective gastroenteritis and colitis: Secondary | ICD-10-CM | POA: Diagnosis not present

## 2022-12-12 LAB — GLUCOSE, CAPILLARY
Glucose-Capillary: 107 mg/dL — ABNORMAL HIGH (ref 70–99)
Glucose-Capillary: 110 mg/dL — ABNORMAL HIGH (ref 70–99)
Glucose-Capillary: 93 mg/dL (ref 70–99)
Glucose-Capillary: 131 mg/dL — ABNORMAL HIGH (ref 70–99)
Glucose-Capillary: 161 mg/dL — ABNORMAL HIGH (ref 70–99)

## 2022-12-12 NOTE — TOC Progression Note (Addendum)
Transition of Care [facility]) - Progression Note    Patient Details  Name: Lindsey Mcguire MRN: [id number] Date of Birth: 1934-06-11  Transition of Care [facility]) CM/SW Contact  Quenten Nawaz A [place], [place] Phone Number: 12/12/2022, 2:06 PM  Clinical Narrative:     Update 1115 CSW was contacted by pt's son, [name], he stated he was interested in pt still attending [address], and paying privately if that option was available. CSW to reach out to liaison with information and provide contact for [name] and Windell Moulding, pt's sister, with permission, to liaison at [facility] for next steps.   TOC will continue to follow.   CSW contacted pt's son [name] for updated on decision with insurance denial and updated discharge plan.  There was no answer. CSW left voicemail with contact information to reach back out to CSW.   TOC will continue to follow.     Barriers to Discharge: Continued Medical Work up  Expected Discharge Plan and Services In-house Referral: Hospice / Palliative Care     Living arrangements for the past 2 months: Single Family Home                                       Social Determinants of Health (SDOH) Interventions SDOH Screenings   Food Insecurity: Patient Unable To Answer (11/28/2022)  Housing: High Risk (11/28/2022)  Transportation Needs: No Transportation Needs (11/28/2022)  Utilities: Patient Unable To Answer (11/28/2022)  Tobacco Use: Low Risk  (11/27/2022)  Health Literacy: High Risk (10/21/2022)   Received from [facility]    Readmission Risk Interventions     No data to display

## 2022-12-12 NOTE — Plan of Care (Signed)

## 2022-12-12 NOTE — Progress Notes (Signed)
PROGRESS NOTE Lindsey Mcguire  OZD:[id number] DOB: [birth year] DOA: 11/27/2022 PCP: Lorenda Ishihara, MD  Brief Narrative/Hospital Course: [phone] with hypertension, hyperlipidemia, hypothyroidism, dementia, HFpEF, type 2 diabetes mellitus, anemia, stage IV CKD, osteoporosis who had recently been discharged on 10/28/2022 from [facility] after being treated for dehydration, deconditioning, failure to thrive, weakness, sepsis from cystitis and was subsequently discharged to SNF and remained in therapy for 2 weeks subsequently discharged home.  Her husband recently passed away.  She lives with her son and grandson.  Family reported that she has been having diarrhea for the last several days and noticed some blood in her stool.  They brought her to the ED for further evaluation. Noted to be extremely debilitated,cachectic and had severe fecal impaction with a large stool ball which required disimpaction in the ED and stercoral colitis noted. She was also noted to be hypotensive and dehydrated and eventually required low-dose norepinephrine infusion to maintain blood pressure. She was started on antibiotic therapy and admission requested for further management. Patient was treated for sepsis due to UTI and stercoral colitis, transferred out of ICU 8/4. 8/4-noticed some blood per rectum heparin DVT prophylaxis discontinued CBC ordered and GI was consulted No plan for endoscopic intervention at this time GI has discussed with family consider flex sigmoidoscopy for recurrent bleeding, advised to continue Cort enema for 2 weeks. Peer to peer done 12/11/22- per medical director from [facility], last PT note today reiewed>pt has been unable to progress with gait and not able to participate and resisting, medical director denied snf>? Consider temporary NH under custodial care then PTOT on medicare part B advised- once willing to participate then can try SN, if good support then [facility].  Subjective: Seen and  examined, Overnight vitals/labs reviewed Resting Mumbles words and at times able to say her name  Assessment and Plan: Principal Problem:   Stercoral colitis Active Problems:   Failure to thrive in adult   Type 2 diabetes mellitus with stage 4 chronic kidney disease, without long-term current use of insulin (HCC)   Hypotension   (HFpEF) heart failure with preserved ejection fraction (HCC)   Hypertension   Dementia (HCC)   Hypothyroidism   Hyperlipidemia   Fecal impaction (HCC)   Chronic constipation   Chronic kidney disease (CKD), stage IV (severe) (HCC)   Hyperglycemia   UTI (urinary tract infection)   Rectal bleeding Stercoral colitis/proctitis with fecal impaction: CT with dilated and stool filled rectum with wall thickening and surrounding inflammatory stranding (concerning for stercoral colitis/proctitis with fecal impaction) >S/p disimpaction on admission.  Completed antibiotics 12/03/22.  Seen by GI, PCCM.No recurrence of bleeding after Cortrenema 8/5>No plan for endoscopic intervention at this time, GI advised to continue Cortenema x 2 wks. Cont prm MiraLAX . Hb stable as below Recent Labs  Lab 12/05/22 0920 12/10/22 0837  HGB 9.0* 9.0*  HCT 28.6* 28.6*    Hypokalemia Resolved.  Hypotension due to dehydration needing pressors in ICU UTI Vitals stable.Asymptomatic.Antibiotics completed  Hypoglycemic due to poor oral intake: D5 discontinued 12/04/22> blood sugar stable as below no recurrence of hypoglycemia.encourage po and feed with assistance as needed. Recent Labs  Lab 12/11/22 1137 12/11/22 1621 12/11/22 2338 12/12/22 0343 12/12/22 0735  GLUCAP 131* 141* 141* 107* 110*    Acute Metabolic Encephalopathy: Dementia: Pleasantly confused  aaox1 at baseline with her dementia.  Continue aspiration/delirium/fall precaution   AKI on CKD IIIB: AKI improved as below, off ivf.  Feed with assistance, she is tolerating p.o.  Recent Labs  11/27/22 1836  11/27/22 1858 11/29/22 1144 11/30/22 0246 12/01/22 0421 12/02/22 0506 12/03/22 0625 12/04/22 0433 12/05/22 0920 12/10/22 0837  BUN 55* 58* 26* 21 15 11 9  6* 7* 15  CREATININE 1.83* 1.90* 1.35* 1.32* 1.21* 1.09* 1.14* 1.03* 1.09* 1.18*  CO2 23  --  24 23 23 24 24 23 22 22     Hypothyroidism Cont synthroid  Anemia of chronic disease with chronic iron deficiency. Hemoglobin has been stable, trend intermittently. Recent Labs    12/03/22 0005 12/03/22 0625 12/04/22 0433 12/05/22 0920 12/10/22 0837  HGB 9.9* 10.3* 9.4* 9.0* 9.0*  MCV  --   --   --  86.1 86.1   Failure to thrive Severe protein calorie malnutrition Debility deconditioning Goals of care: Debilitated,overall prognosis does not appear bright. Continue supportive care, code status remains DNR Seen by PMT>patient's son endorsed that they have had goals of care discussion.  She is at high risk for decompensation and readmission but at this time is medically stable she is awaiting for placement.Plan for SNF  DVT prophylaxis: Place and maintain sequential compression device Start: 12/01/22 1809 SCDs Start: 11/28/22 0734 Code Status:   Code Status: DNR Family Communication: plan of care discussed with patient/I had updated patient's son Lindsey Mcguire 8/5 Family had funeral for patient's husband on 8/8  Patient status is: Inpatient because of sepsis Level of care: Med-Surg   Dispo: The patient is from:Home            Anticipated disposition: SNF has been declined despite p.o. tPA review given that patient has not progressed with PT OT and not participating given her dementia.  Family has appealed  Objective: Vitals last 24 hrs: Vitals:   12/11/22 1531 12/11/22 1957 12/12/22 0325 12/12/22 0736  BP: 108/66 122/66 117/68 109/84  Pulse: 78 68 70 85  Resp: 18 16  18   Temp:  (!) 97.4 F (36.3 C) 98.1 F (36.7 C) 97.9 F (36.6 C)  TempSrc:  Oral Oral Oral  SpO2: 98% 96% 98% 100%  Weight:      Height:       Weight change:    Physical Examination: General exam: alert awake, oriented to self only HEENT:Oral mucosa moist, Ear/Nose WNL grossly Respiratory system: Bilaterally clear BS,no use of accessory muscle Cardiovascular system: S1 & S2 +, No JVD. Gastrointestinal system: Abdomen soft,NT,ND, BS+ Nervous System: Alert, awake, moving all extremities,and following commands. Extremities: LE edema neg,distal peripheral pulses palpable and warm.  Skin: No rashes,no icterus. MSK: Normal muscle bulk,tone, power.   Medications reviewed:  Scheduled Meds:  acidophilus  2 capsule Oral TID   atorvastatin  40 mg Oral Daily   Chlorhexidine Gluconate Cloth  6 each Topical Daily   feeding supplement  237 mL Oral BID BM   Gerhardt's butt cream   Topical BID   hydrocortisone  100 mg Rectal QHS   levothyroxine  88 mcg Oral Daily   multivitamin with minerals  1 tablet Oral Daily   mouth rinse  15 mL Mouth Rinse 4 times per day   pantoprazole  40 mg Oral Daily  Continuous Infusions:  sodium chloride     Diet Order             Diet - low sodium heart healthy           DIET DYS 3 Room service appropriate? Yes; Fluid consistency: Thin  Diet effective now  Intake/Output Summary (Last 24 hours) at 12/12/2022 0851 Last data filed at 12/11/2022 1700 Gross per 24 hour  Intake 480 ml  Output --  Net 480 ml     Net IO Since Admission: 13,035.88 mL [12/12/22 0851]  Wt Readings from Last 3 Encounters:  12/06/22 50.9 kg  11/27/20 66.6 kg  05/29/20 68.1 kg     Unresulted Labs (From admission, onward)    None     Data Reviewed: I have personally reviewed following labs and imaging studies CBC: Recent Labs  Lab 12/05/22 0920 12/10/22 0837  WBC 4.9 4.5  HGB 9.0* 9.0*  HCT 28.6* 28.6*  MCV 86.1 86.1  PLT 157 142*  Basic Metabolic Panel: Recent Labs  Lab 12/05/22 0920 12/10/22 0837  NA 138 134*  K 3.6 4.1  CL 109 103  CO2 22 22  GLUCOSE 98 111*  BUN 7* 15  CREATININE 1.09* 1.18*   CALCIUM 6.9* 7.9*   No results found for this or any previous visit (from the past 240 hour(s)). Antimicrobials: Anti-infectives (From admission, onward)    Start     Dose/Rate Route Frequency Ordered Stop   11/30/22 0838  cefadroxil (DURICEF) capsule 500 mg        500 mg Oral Daily at bedtime 11/30/22 0826 12/03/22 2215   11/29/22 2200  cefTRIAXone (ROCEPHIN) 1 g in sodium chloride 0.9 % 100 mL IVPB  Status:  Discontinued        1 g 200 mL/hr over 30 Minutes Intravenous Every 24 hours 11/29/22 1034 11/30/22 0843   11/28/22 2200  cefTRIAXone (ROCEPHIN) 1 g in sodium chloride 0.9 % 100 mL IVPB  Status:  Discontinued        1 g 200 mL/hr over 30 Minutes Intravenous Every 24 hours 11/28/22 0731 11/28/22 0931   11/28/22 2200  cefTRIAXone (ROCEPHIN) 2 g in sodium chloride 0.9 % 100 mL IVPB  Status:  Discontinued        2 g 200 mL/hr over 30 Minutes Intravenous Every 24 hours 11/28/22 0931 11/29/22 1034   11/28/22 1000  metroNIDAZOLE (FLAGYL) IVPB 500 mg        500 mg 100 mL/hr over 60 Minutes Intravenous Every 12 hours 11/28/22 0731 11/30/22 2205   11/27/22 2300  cefTRIAXone (ROCEPHIN) 1 g in sodium chloride 0.9 % 100 mL IVPB        1 g 200 mL/hr over 30 Minutes Intravenous  Once 11/27/22 2251 11/28/22 0240   11/27/22 2300  metroNIDAZOLE (FLAGYL) IVPB 500 mg        500 mg 100 mL/hr over 60 Minutes Intravenous  Once 11/27/22 2251 11/28/22 0157     Culture/Microbiology    Component Value Date/Time   SDES ASCITIC 02/08/2019 1034   SPECREQUEST BOTTLES DRAWN AEROBIC AND ANAEROBIC 10CC 02/08/2019 1034   CULT  02/08/2019 1034    NO GROWTH 5 DAYS Performed at [facility], [address], [place], [place] [address]    REPTSTATUS 02/13/2019 FINAL 02/08/2019 1034  Other culture-see note  Radiology Studies: No results found. LOS: 14 days  Lanae Boast, MD [facility]  12/12/2022, 8:51 AM

## 2022-12-13 DIAGNOSIS — K5289 Other specified noninfective gastroenteritis and colitis: Secondary | ICD-10-CM | POA: Diagnosis not present

## 2022-12-13 LAB — GLUCOSE, CAPILLARY
Glucose-Capillary: 100 mg/dL — ABNORMAL HIGH (ref 70–99)
Glucose-Capillary: 130 mg/dL — ABNORMAL HIGH (ref 70–99)
Glucose-Capillary: 141 mg/dL — ABNORMAL HIGH (ref 70–99)
Glucose-Capillary: 95 mg/dL (ref 70–99)

## 2022-12-13 MED ORDER — SENNOSIDES-DOCUSATE SODIUM 8.6-50 MG PO TABS
1.0000 | ORAL_TABLET | Freq: Two times a day (BID) | ORAL | Status: DC
  Administered 2022-12-13 – 2022-12-17 (×7): 1 via ORAL
  Filled 2022-12-13 (×8): qty 1

## 2022-12-13 NOTE — Evaluation (Signed)
Clinical/Bedside Swallow Evaluation Patient Details  Name: Lindsey Mcguire MRN: [id number] Date of Birth: 20-Aug-1934  Today's Date: 12/13/2022 Time: SLP Start Time (ACUTE ONLY): 1515 SLP Stop Time (ACUTE ONLY): 1533 SLP Time Calculation (min) (ACUTE ONLY): 18 min  Past Medical History:  Past Medical History:  Diagnosis Date   Anemia 10/28/2003   Dementia (HCC)    Diabetes mellitus    Hypertension    Past Surgical History:  Past Surgical History:  Procedure Laterality Date   CATARACT EXTRACTION  12/06/2003   left eye   TUBAL LIGATION  1977   YAG LASER APPLICATION Right 11/09/2013   Procedure: YAG LASER APPLICATION;  Surgeon: Loraine Leriche T. Nile Riggs, MD;  Location: [facility];  Service: Ophthalmology;  Laterality: Right;   HPI:  Lindsey Mcguire is an 88 yo female presenting to ED 7/31 with diarrhea and bloody stools. Found to be extremely debilitated cachectic with severe fecal impaction with a large stool ball that required disimpaction in the ED and stercoral colitis noted. Recently d/c to SNF from [facility] 7/1 after being treated for dehydration, deconditioning, failure to thrive, weakness, and sepsis from cystitis.   PMH includes dementia, HTN, HLD, hypothyroidism, dementia, HFpEF, T2DM, anemia, stage IV CKD, osteoporosis    Assessment / Plan / Recommendation  Clinical Impression  Per RN, pt has had increased lethargy which has impacted her intake, although RN also notes oral holding and coughing with limited PO intake and med administration. SLP repositioned pt upright in bed. Pt unable to follow commands to complete an oral motor exam, although noted to be grossly [facility]. Pt initiated challenging, consecutive straw sips of thin liquids without any clinical signs of aspiration. Observed with trials of purees and Dys 3 solids from her meal tray with no overt s/s of dysphagia or aspiration. Pt able to self-feed given set up assistance and demonstrates ability to thoroughly masticate solids to  clear POs from her oral cavity. Suspect performance is greatly affected by cognition, although overall pt exhibits fully functional swallow. Recommend continuing diet of Dys 3 textures with thin liquids as mentation allows. No further needs identified. Will s/o. SLP Visit Diagnosis: Dysphagia, unspecified (R13.10)    Aspiration Risk  Mild aspiration risk    Diet Recommendation Dysphagia 3 (Mech soft);Thin liquid    Liquid Administration via: Cup;Straw Medication Administration: Whole meds with puree Supervision: Staff to assist with self feeding;Full supervision/cueing for compensatory strategies Compensations: Minimize environmental distractions;Slow rate;Small sips/bites Postural Changes: Seated upright at 90 degrees;Remain upright for at least 30 minutes after po intake    Other  Recommendations Oral Care Recommendations: Oral care BID    Recommendations for follow up therapy are one component of a multi-disciplinary discharge planning process, led by the attending physician.  Recommendations may be updated based on patient status, additional functional criteria and insurance authorization.  Follow up Recommendations No SLP follow up      Assistance Recommended at Discharge    Functional Status Assessment Patient has not had a recent decline in their functional status  Frequency and Duration            Prognosis Prognosis for improved oropharyngeal function: Good Barriers to Reach Goals: Cognitive deficits      Swallow Study   General HPI: Lindsey Mcguire is an 88 yo female presenting to ED 7/31 with diarrhea and bloody stools. Found to be extremely debilitated cachectic with severe fecal impaction with a large stool ball that required disimpaction in the ED and stercoral colitis  noted. Recently d/c to SNF from [facility] 7/1 after being treated for dehydration, deconditioning, failure to thrive, weakness, and sepsis from cystitis.   PMH includes dementia, HTN, HLD,  hypothyroidism, dementia, HFpEF, T2DM, anemia, stage IV CKD, osteoporosis Type of Study: Bedside Swallow Evaluation Previous Swallow Assessment: none in chart Diet Prior to this Study: Dysphagia 3 (mechanical soft);Thin liquids (Level 0) Temperature Spikes Noted: No Respiratory Status: Room air History of Recent Intubation: No Behavior/Cognition: Alert;Cooperative;Confused Oral Cavity Assessment: Within Functional Limits Oral Care Completed by SLP: No Oral Cavity - Dentition: Missing dentition;Poor condition Vision: Functional for self-feeding Self-Feeding Abilities: Needs assist Patient Positioning: Upright in bed Baseline Vocal Quality: Normal Volitional Cough: Cognitively unable to elicit Volitional Swallow: Unable to elicit    Oral/Motor/Sensory Function Overall Oral Motor/Sensory Function: Within functional limits   Ice Chips Ice chips: Not tested   Thin Liquid Thin Liquid: Within functional limits Presentation: Straw;Self Fed    Nectar Thick Nectar Thick Liquid: Not tested   Honey Thick Honey Thick Liquid: Not tested   Puree Puree: Within functional limits Presentation: Spoon   Solid     Solid: Within functional limits Presentation: Self Fed      Gwynneth Aliment, M.A., CF-SLP Speech Language Pathology, Acute Rehabilitation Services  Secure Chat preferred [phone]  12/13/2022,4:08 PM

## 2022-12-13 NOTE — TOC Progression Note (Addendum)
Transition of Care [facility]) - Progression Note    Patient Details  Name: Lindsey Mcguire MRN: [id number] Date of Birth: May 01, 1934  Transition of Care [facility]) CM/SW Contact  Kiowa Hollar A [place], [place] Phone Number: 12/13/2022, 11:21 AM  Clinical Narrative:     Update 1420 CSW reached back out to [place] at [place] and provided contact information for pt's family, [name] and [name], with pt's family's permission.    CSW contacted Revonda Standard at [facility] and left HIPAA compliant VM with contact number to reach back out to CSW.   TOC will continue to follow.     Barriers to Discharge: Continued Medical Work up  Expected Discharge Plan and Services In-house Referral: Hospice / Palliative Care     Living arrangements for the past 2 months: Single Family Home                                       Social Determinants of Health (SDOH) Interventions SDOH Screenings   Food Insecurity: Patient Unable To Answer (11/28/2022)  Housing: High Risk (11/28/2022)  Transportation Needs: No Transportation Needs (11/28/2022)  Utilities: Patient Unable To Answer (11/28/2022)  Tobacco Use: Low Risk  (11/27/2022)  Health Literacy: High Risk (10/21/2022)   Received from [facility]    Readmission Risk Interventions     No data to display

## 2022-12-13 NOTE — Plan of Care (Signed)

## 2022-12-13 NOTE — Progress Notes (Signed)
Progress Note   Patient: Lindsey Mcguire GNF:[id number] DOB: [birth year] DOA: 11/27/2022     15 DOS: the patient was seen and examined on 12/13/2022   Brief hospital course: 88YOF with hypertension, hyperlipidemia, hypothyroidism, dementia, HFpEF, type 2 diabetes mellitus, anemia, stage IV CKD, osteoporosis who had recently been discharged on 10/28/2022 from [facility] after being treated for dehydration, deconditioning, failure to thrive, weakness, sepsis from cystitis and was subsequently discharged to SNF and remained in therapy for 2 weeks subsequently discharged home.  Her husband recently passed away.  She lives with her son and grandson.  Family reported that she has been having diarrhea for the last several days and noticed some blood in her stool.  They brought her to the ED for further evaluation. Noted to be extremely debilitated,cachectic and had severe fecal impaction with a large stool ball which required disimpaction in the ED and stercoral colitis noted. She was also noted to be hypotensive and dehydrated and eventually required low-dose norepinephrine infusion to maintain blood pressure. She was started on antibiotic therapy and admission requested for further management. Patient was treated for sepsis due to UTI and stercoral colitis, transferred out of ICU 8/4. 8/4-noticed some blood per rectum heparin DVT prophylaxis discontinued CBC ordered and GI was consulted No plan for endoscopic intervention at this time GI has discussed with family consider flex sigmoidoscopy for recurrent bleeding, advised to continue Cort enema for 2 weeks. Peer to peer done 12/11/22- per medical director from [facility], last PT note today reiewed>pt has been unable to progress with gait and not able to participate and resisting, medical director denied snf>? Consider temporary NH under custodial care then PTOT on medicare part B advised- once willing to participate then can try SN, if good support then  [facility].  Assessment and Plan: Principal Problem:   Fecal impaction (HCC)   Chronic constipation   Stercoral colitis Resolved. Continue MiraLAX as needed. Will add stool softeners twice daily.  Active Problems:   Hyperglycemia Has been hypoglycemic at times. Continue CBG monitoring.    Type 2 diabetes mellitus with stage 4 chronic kidney disease,   without long-term current use of insulin (HCC) Carbohydrate modified diet. Off sliding scale insulin due to hypoglycemia.    Hypotension Due to:   UTI (urinary tract infection) Antibiotics completed.    (HFpEF) heart failure with preserved ejection fraction (HCC) No signs of overload.    Hypertension As needed antihypertensives.    Hypothyroidism Continue levothyroxine 88 mcg p.o. daily.    Hyperlipidemia Continue atorvastatin 40 mg p.o. daily.    Chronic kidney disease (CKD), stage IV (severe) (HCC) Monitor renal function and electrolytes.    Dementia (HCC)   Failure to thrive in adult Supportive care. Continue aspiration, delirium and fall precautions.    Subjective: Since having at bedside.  Able to answer simple questions.  No pain at the moment.  No acute distress.  Physical Exam: Vitals:   12/12/22 0736 12/12/22 1541 12/12/22 2122 12/12/22 2216  BP: 109/84 113/65 (!) 119/55   Pulse: 85 76 86   Resp: 18 17 17    Temp: 97.9 F (36.6 C) 98 F (36.7 C) 98.8 F (37.1 C)   TempSrc: Oral     SpO2: 100% 100% 91%   Weight:    47.1 kg  Height:       Physical Exam Vitals and nursing note reviewed.  Constitutional:      General: She is awake. She is not in acute distress.  Appearance: Normal appearance. She is ill-appearing.  HENT:     Head: Normocephalic.     Nose: No rhinorrhea.     Mouth/Throat:     Mouth: Mucous membranes are moist.  Eyes:     General: No scleral icterus.    Pupils: Pupils are equal, round, and reactive to light.  Neck:     Vascular: No JVD.  Cardiovascular:     Rate and Rhythm:  Normal rate.     Heart sounds: S1 normal and S2 normal.  Pulmonary:     Effort: Pulmonary effort is normal.     Breath sounds: Normal breath sounds.  Abdominal:     General: Bowel sounds are normal.     Palpations: Abdomen is soft.     Tenderness: There is no abdominal tenderness.  Musculoskeletal:     Cervical back: Neck supple.     Right lower leg: No edema.     Left lower leg: No edema.  Skin:    General: Skin is warm and dry.  Neurological:     Mental Status: She is alert. Mental status is at baseline.  Psychiatric:        Mood and Affect: Mood normal.        Behavior: Behavior is cooperative.        Cognition and Memory: Cognition is impaired. Memory is impaired.    Data Reviewed:  There are no new results to review at this time.  Family Communication: None at bedside.  Disposition: Status is: Inpatient Remains inpatient appropriate because: Pending placement.  Planned Discharge Destination: Skilled nursing facility  Time spent: 50 minutes  Author: Bobette Mo, MD 12/13/2022 7:34 AM  For on call review [email or web address].   This document was prepared using Dragon voice recognition software and may contain some unintended transcription errors.

## 2022-12-13 NOTE — TOC Progression Note (Signed)
Transition of Care [facility]) - Progression Note    Patient Details  Name: Lindsey Mcguire MRN: [id number] Date of Birth: March 06, 1935  Transition of Care [facility]) CM/SW Contact  Itay Mella A [place], [place] Phone Number: 12/13/2022, 5:00 PM  Clinical Narrative:     CSW was contacted by pt's son, [name] who stated that they were planning to private pay but also wanted appeal information to process appeal. He stated [facility] facility informed him they would reimburse the family if appeal was successful.  Revonda Standard are [facility] to reach out regarding bed availability and next steps once family completes admission process.   TOC will continue to follow.    Barriers to Discharge: Continued Medical Work up  Expected Discharge Plan and Services In-house Referral: Hospice / Palliative Care     Living arrangements for the past 2 months: Single Family Home                                       Social Determinants of Health (SDOH) Interventions SDOH Screenings   Food Insecurity: Patient Unable To Answer (11/28/2022)  Housing: High Risk (11/28/2022)  Transportation Needs: No Transportation Needs (11/28/2022)  Utilities: Patient Unable To Answer (11/28/2022)  Tobacco Use: Low Risk  (11/27/2022)  Health Literacy: High Risk (10/21/2022)   Received from [facility]    Readmission Risk Interventions     No data to display

## 2022-12-14 LAB — GLUCOSE, CAPILLARY
Glucose-Capillary: 105 mg/dL — ABNORMAL HIGH (ref 70–99)
Glucose-Capillary: 116 mg/dL — ABNORMAL HIGH (ref 70–99)
Glucose-Capillary: 127 mg/dL — ABNORMAL HIGH (ref 70–99)
Glucose-Capillary: 79 mg/dL (ref 70–99)
Glucose-Capillary: 86 mg/dL (ref 70–99)
Glucose-Capillary: 92 mg/dL (ref 70–99)

## 2022-12-14 NOTE — Plan of Care (Signed)

## 2022-12-14 NOTE — Progress Notes (Signed)
Progress Note   Patient: Lindsey Mcguire:[id number] DOB: 30-Sep-1934 DOA: 11/27/2022     16 DOS: the patient was seen and examined on 12/14/2022   Brief hospital course: 88YOF with hypertension, hyperlipidemia, hypothyroidism, dementia, HFpEF, type 2 diabetes mellitus, anemia, stage IV CKD, osteoporosis who had recently been discharged on 10/28/2022 from [facility] after being treated for dehydration, deconditioning, failure to thrive, weakness, sepsis from cystitis and was subsequently discharged to SNF and remained in therapy for 2 weeks subsequently discharged home.  Her husband recently passed away.  She lives with her son and grandson.  Family reported that she has been having diarrhea for the last several days and noticed some blood in her stool.  They brought her to the ED for further evaluation. Noted to be extremely debilitated,cachectic and had severe fecal impaction with a large stool ball which required disimpaction in the ED and stercoral colitis noted. She was also noted to be hypotensive and dehydrated and eventually required low-dose norepinephrine infusion to maintain blood pressure. She was started on antibiotic therapy and admission requested for further management. Patient was treated for sepsis due to UTI and stercoral colitis, transferred out of ICU 8/4. 8/4-noticed some blood per rectum heparin DVT prophylaxis discontinued CBC ordered and GI was consulted No plan for endoscopic intervention at this time GI has discussed with family consider flex sigmoidoscopy for recurrent bleeding, advised to continue Cort enema for 2 weeks. Peer to peer done 12/11/22- per medical director from [facility], last PT note today reiewed>pt has been unable to progress with gait and not able to participate and resisting, medical director denied snf>? Consider temporary NH under custodial care then PTOT on medicare part B advised- once willing to participate then can try SN, if good support then  [facility].  Assessment and Plan:  Fecal impaction, Chronic constipation and Stercoral colitis: Resolved. Continue MiraLAX as needed and stool softeners twice daily.   Hyperglycemia with type 2 diabetes mellitus with stage 4 chronic kidney disease,  without long-term current use of insulin: resolved F/u FSBS Carbohydrate modified diet. Off sliding scale insulin due to hypoglycemia.   Hypotension: resolved   UTI (urinary tract infection): Antibiotics completed.   (HFpEF) heart failure with preserved ejection fraction (HCC) No signs of overload.   Hypertension: controlled As needed antihypertensives.   Hypothyroidism: Continue levothyroxine 88 mcg p.o. daily.   Hyperlipidemia: Continue atorvastatin 40 mg p.o. daily.   Chronic kidney disease (CKD), stage IV (severe) (HCC) Monitor renal function and electrolytes.   Dementia (HCC) Failure to thrive in adult Supportive care. Continue aspiration, delirium and fall precautions      Subjective: Patient did not participate with history or ROS  Physical Exam: Vitals:   12/13/22 1719 12/13/22 2002 12/14/22 0529 12/14/22 0820  BP: (!) 145/78 136/86 (!) 163/82 (!) 147/82  Pulse: 83 72 72 69  Resp:  18 18 18   Temp: 97.8 F (36.6 C) 98.2 F (36.8 C) (!) 97.4 F (36.3 C) 98 F (36.7 C)  TempSrc:   Oral   SpO2: 100% 100% 100% 98%  Weight:      Height:       Constitutional:      General: She is awake. She is not in acute distress.    Appearance: Normal appearance. She is ill-appearing.  HENT:     Head: Normocephalic.     Nose: No rhinorrhea.     Mouth/Throat:     Mouth: Mucous membranes are moist.  Eyes:     General:  No scleral icterus.    Pupils: Pupils are equal, round, and reactive to light.  Neck:     Vascular: No JVD.  Cardiovascular:     Rate and Rhythm: Normal rate.     Heart sounds: S1 normal and S2 normal.  Pulmonary:     Effort: Pulmonary effort is normal.     Breath sounds: Normal breath sounds.  Abdominal:      General: Bowel sounds are normal.     Palpations: Abdomen is soft.     Tenderness: There is no abdominal tenderness.  Musculoskeletal:     Cervical back: Neck supple.     Right lower leg: No edema.     Left lower leg: No edema.  Skin:    General: Skin is warm and dry.  Neurological:     Mental Status: She is alert. Mental status is at baseline with dementia  Psychiatric:        Mood and Affect: Mood normal.        Behavior: Behavior is cooperative.        Cognition and Memory: Cognition is impaired. Memory is impaired.     Data Reviewed: Last labs:]  Latest Reference Range & Units 12/10/22 08:37  Sodium 135 - 145 mmol/L 134 (L)  Potassium 3.5 - 5.1 mmol/L 4.1  Chloride 98 - 111 mmol/L 103  CO2 22 - 32 mmol/L 22  Glucose 70 - 99 mg/dL 161 (H)  BUN 8 - 23 mg/dL 15  Creatinine 0.96 - 0.45 mg/dL 4.09 (H)  Calcium 8.9 - 10.3 mg/dL 7.9 (L)  Anion gap 5 - 15  9  GFR, Estimated >60 mL/min 45 (L)  WBC 4.0 - 10.5 K/uL 4.5  RBC 3.87 - 5.11 MIL/uL 3.32 (L)  Hemoglobin 12.0 - 15.0 g/dL 9.0 (L)  HCT 81.1 - 91.4 % 28.6 (L)  MCV 80.0 - 100.0 fL 86.1  MCH 26.0 - 34.0 pg 27.1  MCHC 30.0 - 36.0 g/dL 78.2  RDW 95.6 - 21.3 % 14.8  Platelets 150 - 400 K/uL 142 (L)  nRBC 0.0 - 0.2 % 0.0  (L): Data is abnormally low (H): Data is abnormally high  Family Communication:   Disposition: Status is: Inpatient   Planned Discharge Destination:  Consider temporary NH under custodial care     Time spent: 25 minutes  Author: Ernestene Mention, MD 12/14/2022 1:01 PM  For on call review [email or web address].

## 2022-12-15 LAB — GLUCOSE, CAPILLARY
Glucose-Capillary: 105 mg/dL — ABNORMAL HIGH (ref 70–99)
Glucose-Capillary: 110 mg/dL — ABNORMAL HIGH (ref 70–99)
Glucose-Capillary: 124 mg/dL — ABNORMAL HIGH (ref 70–99)
Glucose-Capillary: 131 mg/dL — ABNORMAL HIGH (ref 70–99)
Glucose-Capillary: 94 mg/dL (ref 70–99)
Glucose-Capillary: 98 mg/dL (ref 70–99)

## 2022-12-15 NOTE — Progress Notes (Signed)
Progress Note   Patient: Lindsey Mcguire WUJ:[id number] DOB: 1934-09-19 DOA: 11/27/2022     17 DOS: the patient was seen and examined on 12/15/2022   Brief hospital course: 88YOF with hypertension, hyperlipidemia, hypothyroidism, dementia, HFpEF, type 2 diabetes mellitus, anemia, stage IV CKD, osteoporosis who had recently been discharged on 10/28/2022 from [facility] after being treated for dehydration, deconditioning, failure to thrive, weakness, sepsis from cystitis and was subsequently discharged to SNF and remained in therapy for 2 weeks subsequently discharged home.  Her husband recently passed away.  She lives with her son and grandson.  Family reported that she has been having diarrhea for the last several days and noticed some blood in her stool.  They brought her to the ED for further evaluation. Noted to be extremely debilitated,cachectic and had severe fecal impaction with a large stool ball which required disimpaction in the ED and stercoral colitis noted. She was also noted to be hypotensive and dehydrated and eventually required low-dose norepinephrine infusion to maintain blood pressure. She was started on antibiotic therapy and admission requested for further management. Patient was treated for sepsis due to UTI and stercoral colitis, transferred out of ICU 8/4. 8/4-noticed some blood per rectum heparin DVT prophylaxis discontinued CBC ordered and GI was consulted No plan for endoscopic intervention at this time GI has discussed with family consider flex sigmoidoscopy for recurrent bleeding, advised to continue Cort enema for 2 weeks. Peer to peer done 12/11/22- per medical director from [facility], last PT note today reiewed>pt has been unable to progress with gait and not able to participate and resisting, medical director denied snf>? Consider temporary NH under custodial care then PTOT on medicare part B advised- once willing to participate then can try SN, if good support then  [facility].  Assessment and Plan:  Fecal impaction, Chronic constipation and Stercoral colitis: Resolved. Continue MiraLAX as needed and stool softeners twice daily.   Hyperglycemia with type 2 diabetes mellitus with stage 4 chronic kidney disease,  without long-term current use of insulin: resolved F/u FSBS Carbohydrate modified diet. Off sliding scale insulin due to hypoglycemia.   Hypotension: resolved   UTI (urinary tract infection): Antibiotics completed.   (HFpEF) heart failure with preserved ejection fraction (HCC) No signs of overload.   Hypertension: controlled As needed antihypertensives.   Hypothyroidism: Continue levothyroxine 88 mcg p.o. daily.   Hyperlipidemia: Continue atorvastatin 40 mg p.o. daily.   Chronic kidney disease (CKD), stage IV (severe) (HCC) Monitor renal function and electrolytes.   Dementia (HCC) Failure to thrive in adult Supportive care. Continue aspiration, delirium and fall precautions      Subjective: Pt is non verbal to me. Patient did not participate with history or ROS  Physical Exam: Vitals:   12/14/22 1630 12/14/22 2139 12/15/22 0505 12/15/22 0757  BP: (!) 143/87 (!) 155/86 (!) 157/87 (!) 150/85  Pulse: 73 70 71 70  Resp: 16 18 18 20   Temp: 98 F (36.7 C) 98 F (36.7 C) 98.3 F (36.8 C) 98.4 F (36.9 C)  TempSrc:  Oral  Oral  SpO2: 99% 100% 100% 99%  Weight:      Height:       Constitutional:      General: She is awake. She is not in acute distress.    Appearance: Normal appearance. She is ill-appearing.  HENT:     Head: Normocephalic.     Nose: No rhinorrhea.     Mouth/Throat:     Mouth: Mucous membranes are moist.  Eyes:     General: No scleral icterus.    Pupils: Pupils are equal, round, and reactive to light.  Neck:     Vascular: No JVD.  Cardiovascular:     Rate and Rhythm: Normal rate.     Heart sounds: S1 normal and S2 normal.  Pulmonary:     Effort: Pulmonary effort is normal.     Breath sounds: Normal  breath sounds.  Abdominal:     General: Bowel sounds are normal.     Palpations: Abdomen is soft.     Tenderness: There is no abdominal tenderness.  Musculoskeletal:     Cervical back: Neck supple.     Right lower leg: No edema.     Left lower leg: No edema.  Skin:    General: Skin is warm and dry.  Neurological:     Mental Status: She is alert. Mental status is at baseline with dementia  Psychiatric:        Mood and Affect: Mood normal.        Behavior: Behavior is cooperative.        Cognition and Memory: Cognition is impaired. Memory is impaired.     Data Reviewed: Last labs:]  Latest Reference Range & Units 12/10/22 08:37  Sodium 135 - 145 mmol/L 134 (L)  Potassium 3.5 - 5.1 mmol/L 4.1  Chloride 98 - 111 mmol/L 103  CO2 22 - 32 mmol/L 22  Glucose 70 - 99 mg/dL 454 (H)  BUN 8 - 23 mg/dL 15  Creatinine 0.98 - 1.19 mg/dL 1.47 (H)  Calcium 8.9 - 10.3 mg/dL 7.9 (L)  Anion gap 5 - 15  9  GFR, Estimated >60 mL/min 45 (L)  WBC 4.0 - 10.5 K/uL 4.5  RBC 3.87 - 5.11 MIL/uL 3.32 (L)  Hemoglobin 12.0 - 15.0 g/dL 9.0 (L)  HCT 82.9 - 56.2 % 28.6 (L)  MCV 80.0 - 100.0 fL 86.1  MCH 26.0 - 34.0 pg 27.1  MCHC 30.0 - 36.0 g/dL 13.0  RDW 86.5 - 78.4 % 14.8  Platelets 150 - 400 K/uL 142 (L)  nRBC 0.0 - 0.2 % 0.0  (L): Data is abnormally low (H): Data is abnormally high  Latest Reference Range & Units 12/14/22 05:36 12/14/22 08:19 12/14/22 11:18 12/14/22 16:30 12/14/22 20:45 12/15/22 00:24 12/15/22 05:04 12/15/22 07:54  Glucose-Capillary 70 - 99 mg/dL 92 79 86 696 (H) 295 (H) 131 (H) 105 (H) 98  (H): Data is abnormally high  Family Communication:   Disposition: Status is: Inpatient   Planned Discharge Destination:  Consider temporary NH under custodial care     Time spent: 25 minutes  Author: Ernestene Mention, MD 12/15/2022 9:53 AM  For on call review [email or web address].

## 2022-12-15 NOTE — Plan of Care (Signed)

## 2022-12-15 NOTE — Plan of Care (Signed)
Patient slept well. No distress noted. Remained in bed, repositioned frequently. Will continue to monitor.  Problem: Elimination: Goal: Will not experience complications related to urinary retention Outcome: Progressing   Problem: Safety: Goal: Ability to remain free from injury will improve Outcome: Progressing   Problem: Skin Integrity: Goal: Risk for impaired skin integrity will decrease Outcome: Progressing

## 2022-12-16 DIAGNOSIS — K5641 Fecal impaction: Secondary | ICD-10-CM | POA: Diagnosis not present

## 2022-12-16 DIAGNOSIS — I5032 Chronic diastolic (congestive) heart failure: Secondary | ICD-10-CM | POA: Diagnosis not present

## 2022-12-16 DIAGNOSIS — R627 Adult failure to thrive: Secondary | ICD-10-CM | POA: Diagnosis not present

## 2022-12-16 DIAGNOSIS — E039 Hypothyroidism, unspecified: Secondary | ICD-10-CM

## 2022-12-16 DIAGNOSIS — F02C0 Dementia in other diseases classified elsewhere, severe, without behavioral disturbance, psychotic disturbance, mood disturbance, and anxiety: Secondary | ICD-10-CM

## 2022-12-16 DIAGNOSIS — K5289 Other specified noninfective gastroenteritis and colitis: Secondary | ICD-10-CM | POA: Diagnosis not present

## 2022-12-16 LAB — GLUCOSE, CAPILLARY
Glucose-Capillary: 163 mg/dL — ABNORMAL HIGH (ref 70–99)
Glucose-Capillary: 105 mg/dL — ABNORMAL HIGH (ref 70–99)
Glucose-Capillary: 89 mg/dL (ref 70–99)
Glucose-Capillary: 95 mg/dL (ref 70–99)
Glucose-Capillary: 123 mg/dL — ABNORMAL HIGH (ref 70–99)
Glucose-Capillary: 123 mg/dL — ABNORMAL HIGH (ref 70–99)

## 2022-12-16 NOTE — Plan of Care (Signed)

## 2022-12-16 NOTE — Progress Notes (Signed)
   Medical records reviewed including progress notes, labs. Patient awaits final disposition pending family completion of SNF placement process, private pay vs appeal.   Patient's family have not reached out to inform PMT of desire for further GOC discussions. Per previous discussions, they prefer to call team line and initiate discussions if interested.   Patient is medically stable for discharge with clear goals of care and no further palliative needs identified. PMT will sign off at this time. Please re-consult should the family request for additional palliative support.  Thank you for your referral and allowing PMT to assist in Mrs. Lindsey Mcguire's care.   Richardson Dopp, [facility] Palliative Medicine Team  Team Phone # [phone]   NO CHARGE

## 2022-12-16 NOTE — Progress Notes (Signed)
PT Cancellation Note  Patient Details Name: Lindsey Mcguire MRN: [id number] DOB: July 05, 1934   Cancelled Treatment:    Reason Eval/Treat Not Completed: (P) Fatigue/lethargy limiting ability to participate, attempted x2 throughout day with pt sleeping on both attempts, unable to be woken with verbal and tactile stimuli. Will check back as schedule allows to continue with PT POC.  Lenora Boys. PTA Acute Rehabilitation Services Office: [phone]    Catalina Antigua 12/16/2022, 4:02 PM

## 2022-12-16 NOTE — Progress Notes (Signed)
Progress Note   Patient: Lindsey Mcguire PIR:[id number] DOB: December 08, 1934 DOA: 11/27/2022     18 DOS: the patient was seen and examined on 12/16/2022   Brief hospital course: 88YOF with hypertension, hyperlipidemia, hypothyroidism, dementia, HFpEF, type 2 diabetes mellitus, anemia, stage IV CKD, osteoporosis who had recently been discharged on 10/28/2022 from [facility] after being treated for dehydration, deconditioning, failure to thrive, weakness, sepsis from cystitis and was subsequently discharged to SNF and remained in therapy for 2 weeks subsequently discharged home.  Her husband recently passed away.  She lives with her son and grandson.  Family reported that she has been having diarrhea for the last several days and noticed some blood in her stool.  They brought her to the ED for further evaluation. Noted to be extremely debilitated,cachectic and had severe fecal impaction with a large stool ball which required disimpaction in the ED and stercoral colitis noted. She was also noted to be hypotensive and dehydrated and eventually required low-dose norepinephrine infusion to maintain blood pressure. She was started on antibiotic therapy and admission requested for further management. Patient was treated for sepsis due to UTI and stercoral colitis, transferred out of ICU 8/4. 8/4-noticed some blood per rectum heparin DVT prophylaxis discontinued CBC ordered and GI was consulted No plan for endoscopic intervention at this time GI has discussed with family consider flex sigmoidoscopy for recurrent bleeding, advised to continue Cort enema for 2 weeks. Peer to peer done 12/11/22- per medical director from [facility], last PT note today reiewed>pt has been unable to progress with gait and not able to participate and resisting, medical director denied snf>? Consider temporary NH under custodial care then PTOT on medicare part B advised- once willing to participate then can try SN, if good support then  [facility].  8/19: Medically stable.  Family is trying to get her to SNF as private pay, can be discharged once paperwork is completed.  Assessment and Plan:  Fecal impaction, Chronic constipation and Stercoral colitis: Resolved. Continue MiraLAX as needed and stool softeners twice daily.   Hyperglycemia with type 2 diabetes mellitus with stage 4 chronic kidney disease,  without long-term current use of insulin: resolved F/u FSBS Carbohydrate modified diet. Off sliding scale insulin due to hypoglycemia.   Hypotension: resolved   UTI (urinary tract infection): Antibiotics completed.   (HFpEF) heart failure with preserved ejection fraction (HCC) No signs of overload.   Hypertension: controlled As needed antihypertensives.   Hypothyroidism: Continue levothyroxine 88 mcg p.o. daily.   Hyperlipidemia: Continue atorvastatin 40 mg p.o. daily.   Chronic kidney disease (CKD), stage IV (severe) (HCC) Monitor renal function and electrolytes.   Dementia (HCC) Failure to thrive in adult Supportive care. Continue aspiration, delirium and fall precautions      Subjective: Patient remained nonverbal.  Not following any commands, vital stable  Physical Exam: Vitals:   12/15/22 1800 12/15/22 2050 12/16/22 0541 12/16/22 0813  BP: 136/76 127/69 131/75 (!) 146/81  Pulse: 67 99 86 78  Resp: 20 17 18 18   Temp: 98.6 F (37 C) 98.4 F (36.9 C) (!) 97.4 F (36.3 C) 97.8 F (36.6 C)  TempSrc: Oral Oral Oral Oral  SpO2: 100% 99% 94% 100%  Weight:      Height:       General.  Frail and malnourished elderly lady, in no acute distress. Pulmonary.  Lungs clear bilaterally, normal respiratory effort. CV.  Regular rate and rhythm, no JVD, rub or murmur. Abdomen.  Soft, nontender, nondistended, BS positive. CNS.  Nonverbal, not following any commands Extremities.  No edema, no cyanosis, pulses intact and symmetrical. Psychiatry.  Judgment and insight appears impaired  Data Reviewed: Prior data  reviewed  Family Communication: No family at bedside  Disposition: Status is: Inpatient   Planned Discharge Destination:  Consider temporary NH under custodial care   Time spent: 40 minutes  This record has been created using Conservation officer, historic buildings. Errors have been sought and corrected,but may not always be located. Such creation errors do not reflect on the standard of care.   Author: Arnetha Courser, MD 12/16/2022 3:14 PM  For on call review [email or web address].

## 2022-12-16 NOTE — TOC Progression Note (Signed)
Transition of Care [facility]) - Progression Note    Patient Details  Name: Lindsey Mcguire MRN: [id number] Date of Birth: 08-Sep-1934  Transition of Care [facility]) CM/SW Contact  Teliah Buffalo A [place], [place] Phone Number: 12/16/2022, 3:47 PM  Clinical Narrative:     CSW reached out to [place] at [facility] to updated on possible dc date. She stated she has not been able to reach the family since last week. She said only has one bed left and family would need to inform her of next steps so it can be secured.   CSW then contacted [name], pt's son with discharge update. There was no answer. CSW left voicemail with contact information to reach back out to CSW.   CSW then reached out to pt's sister, [name]. CSW informed her of planned dc for tomorrow and limited bed availability at [facility].   She said she would reach out to [facility] and provide CSW with update on dc plan for [place].   TOC will continue to follow.    Barriers to Discharge: Continued Medical Work up  Expected Discharge Plan and Services In-house Referral: Hospice / Palliative Care     Living arrangements for the past 2 months: Single Family Home                                       Social Determinants of Health (SDOH) Interventions SDOH Screenings   Food Insecurity: Patient Unable To Answer (11/28/2022)  Housing: High Risk (11/28/2022)  Transportation Needs: No Transportation Needs (11/28/2022)  Utilities: Patient Unable To Answer (11/28/2022)  Tobacco Use: Low Risk  (11/27/2022)  Health Literacy: High Risk (10/21/2022)   Received from [facility]    Readmission Risk Interventions     No data to display

## 2022-12-17 DIAGNOSIS — K5641 Fecal impaction: Secondary | ICD-10-CM | POA: Diagnosis not present

## 2022-12-17 DIAGNOSIS — I5032 Chronic diastolic (congestive) heart failure: Secondary | ICD-10-CM | POA: Diagnosis not present

## 2022-12-17 DIAGNOSIS — K5289 Other specified noninfective gastroenteritis and colitis: Secondary | ICD-10-CM | POA: Diagnosis not present

## 2022-12-17 DIAGNOSIS — R627 Adult failure to thrive: Secondary | ICD-10-CM | POA: Diagnosis not present

## 2022-12-17 LAB — GLUCOSE, CAPILLARY
Glucose-Capillary: 124 mg/dL — ABNORMAL HIGH (ref 70–99)
Glucose-Capillary: 124 mg/dL — ABNORMAL HIGH (ref 70–99)
Glucose-Capillary: 142 mg/dL — ABNORMAL HIGH (ref 70–99)
Glucose-Capillary: 145 mg/dL — ABNORMAL HIGH (ref 70–99)
Glucose-Capillary: 152 mg/dL — ABNORMAL HIGH (ref 70–99)

## 2022-12-17 NOTE — Progress Notes (Signed)
PT Cancellation Note  Patient Details Name: Lindsey Mcguire MRN: [id number] DOB: May 28, 1934   Cancelled Treatment:    Reason Eval/Treat Not Completed: (P) Fatigue/lethargy limiting ability to participate, pt with eyes closed and unable to be roused for active participation in session. Will check back as schedule allows to continue with PT POC.  Lenora Boys. PTA Acute Rehabilitation Services Office: [phone]    Catalina Antigua 12/17/2022, 2:39 PM

## 2022-12-17 NOTE — Progress Notes (Signed)
Progress Note   Patient: Lindsey Mcguire WGN:[id number] DOB: 1935-03-09 DOA: 11/27/2022     19 DOS: the patient was seen and examined on 12/17/2022   Brief hospital course: 88YOF with hypertension, hyperlipidemia, hypothyroidism, dementia, HFpEF, type 2 diabetes mellitus, anemia, stage IV CKD, osteoporosis who had recently been discharged on 10/28/2022 from [facility] after being treated for dehydration, deconditioning, failure to thrive, weakness, sepsis from cystitis and was subsequently discharged to SNF and remained in therapy for 2 weeks subsequently discharged home.  Her husband recently passed away.  She lives with her son and grandson.  Family reported that she has been having diarrhea for the last several days and noticed some blood in her stool.  They brought her to the ED for further evaluation. Noted to be extremely debilitated,cachectic and had severe fecal impaction with a large stool ball which required disimpaction in the ED and stercoral colitis noted. She was also noted to be hypotensive and dehydrated and eventually required low-dose norepinephrine infusion to maintain blood pressure. She was started on antibiotic therapy and admission requested for further management. Patient was treated for sepsis due to UTI and stercoral colitis, transferred out of ICU 8/4. 8/4-noticed some blood per rectum heparin DVT prophylaxis discontinued CBC ordered and GI was consulted No plan for endoscopic intervention at this time GI has discussed with family consider flex sigmoidoscopy for recurrent bleeding, advised to continue Cort enema for 2 weeks. Peer to peer done 12/11/22- per medical director from [facility], last PT note today reiewed>pt has been unable to progress with gait and not able to participate and resisting, medical director denied snf>? Consider temporary NH under custodial care then PTOT on medicare part B advised- once willing to participate then can try SN, if good support then  [facility].  8/19: Medically stable.  Family is trying to get her to SNF as private pay, can be discharged once paperwork is completed.  8/20: Hemodynamically stable.  Family completed the paperwork for private pay SNF.  She will have bed available from tomorrow.  Assessment and Plan:  Fecal impaction, Chronic constipation and Stercoral colitis: Resolved. Continue MiraLAX as needed and stool softeners twice daily.   Hyperglycemia with type 2 diabetes mellitus with stage 4 chronic kidney disease,  without long-term current use of insulin: resolved F/u FSBS Carbohydrate modified diet. Off sliding scale insulin due to hypoglycemia.   Hypotension: resolved   UTI (urinary tract infection): Antibiotics completed.   (HFpEF) heart failure with preserved ejection fraction (HCC) No signs of overload.   Hypertension: controlled As needed antihypertensives.   Hypothyroidism: Continue levothyroxine 88 mcg p.o. daily.   Hyperlipidemia: Continue atorvastatin 40 mg p.o. daily.   Chronic kidney disease (CKD), stage IV (severe) (HCC) Monitor renal function and electrolytes.   Dementia (HCC) Failure to thrive in adult Supportive care. Continue aspiration, delirium and fall precautions      Subjective: Patient little more alert but mostly remain nonverbal.  Physical Exam: Vitals:   12/17/22 0532 12/17/22 0755 12/17/22 0818 12/17/22 1547  BP: 108/73 131/82 116/86 137/79  Pulse: 89 84 81 98  Resp:   20   Temp: 97.9 F (36.6 C)  98.9 F (37.2 C) (!) 96.8 F (36 C)  TempSrc: Oral  Oral   SpO2: 98%  98% 100%  Weight: 43.8 kg     Height:       General.  Malnourished elderly lady, in no acute distress. Pulmonary.  Lungs clear bilaterally, normal respiratory effort. CV.  Regular rate and  rhythm, no JVD, rub or murmur. Abdomen.  Soft, nontender, nondistended, BS positive. CNS.  Alert , nonverbal.  No apparent focal neurologic deficit. Extremities.  No edema, no cyanosis, pulses intact and  symmetrical.   Data Reviewed: Prior data reviewed  Family Communication: No family at bedside  Disposition: Status is: Inpatient   Planned Discharge Destination:  Consider temporary NH under custodial care   Time spent: 35 minutes  This record has been created using Conservation officer, historic buildings. Errors have been sought and corrected,but may not always be located. Such creation errors do not reflect on the standard of care.   Author: Arnetha Courser, MD 12/17/2022 7:03 PM  For on call review [email or web address].

## 2022-12-17 NOTE — Progress Notes (Addendum)
Nutrition Follow-up  DOCUMENTATION CODES:   Not applicable  INTERVENTION:  - Continue Ensure Enlive po BID, each supplement provides 350 kcal and 20 grams of protein.  - Continue MVI q day.   NUTRITION DIAGNOSIS:   Inadequate oral intake related to lethargy/confusion as evidenced by meal completion < 50%.  GOAL:   Patient will meet greater than or equal to 90% of their needs  MONITOR:   PO intake, Supplement acceptance  REASON FOR ASSESSMENT:   Malnutrition Screening Tool    ASSESSMENT:   88 y.o. female admits related to rectal bleeding. PMH includes: anemia, dementia, DM, HTN. Pt is currently receiving medical management related to stercoral colitis.  Meds reviewed: risaquad, lipitor, MVI, senokot. Labs reviewed (8/13): Na low.   Pt is disoriented x4. Pt continues with good PO intakes. Per record, pt has eaten mostly 50-100% of her meals over the past 7 days. Pt is planning to discharge to SNF soon. RD will continue to monitor PO intakes and POC.   Diet Order:   Diet Order             Diet - low sodium heart healthy           DIET DYS 3 Room service appropriate? Yes; Fluid consistency: Thin  Diet effective now                   EDUCATION NEEDS:   Not appropriate for education at this time  Skin:  Skin Assessment: Reviewed RN Assessment  Last BM:  8/18 - type 4  Height:   Ht Readings from Last 1 Encounters:  11/28/22 5' (1.524 m)    Weight:   Wt Readings from Last 1 Encounters:  12/17/22 43.8 kg    Ideal Body Weight:     BMI:  Body mass index is 18.86 kg/m.  Estimated Nutritional Needs:   Kcal:  1300-1600 kcals  Protein:  65-80 gm  Fluid:  >/= 1.3 L  Bethann Humble, RD, LDN, CNSC.

## 2022-12-17 NOTE — TOC Progression Note (Addendum)
Transition of Care [facility]) - Progression Note    Patient Details  Name: Lindsey Mcguire MRN: [id number] Date of Birth: August 26, 1934  Transition [facility]) CM/SW Contact  Velencia Lenart A [place], [place] Phone Number: 12/17/2022, 1:46 PM  Clinical Narrative:     Update 1700  Pt able to DC to [facility] tomorrow, bed is available. CSW received contact from [facility] [place] stating family had completed paperwork and they have bed available. Provider notified.   TOC will continue to follow.   CSW received contact from pt's sister [name], stated Jonny Ruiz, pt's son, was completing paperwork with [facility] to facilitate admission.   CSW followed up with [facility] regarding discharge to facility once pt's paperwork has been completed. No response on availability yet.   TOC will continue to follow.     Barriers to Discharge: Continued Medical Work up  Expected Discharge Plan and Services In-house Referral: Hospice / Palliative Care     Living arrangements for the past 2 months: Single Family Home                                       Social Determinants of Health (SDOH) Interventions SDOH Screenings   Food Insecurity: Patient Unable To Answer (11/28/2022)  Housing: High Risk (11/28/2022)  Transportation Needs: No Transportation Needs (11/28/2022)  Utilities: Patient Unable To Answer (11/28/2022)  Tobacco Use: Low Risk  (11/27/2022)  Health Literacy: High Risk (10/21/2022)   Received from [facility]    Readmission Risk Interventions     No data to display

## 2022-12-18 DIAGNOSIS — K5289 Other specified noninfective gastroenteritis and colitis: Secondary | ICD-10-CM | POA: Diagnosis not present

## 2022-12-18 LAB — GLUCOSE, CAPILLARY
Glucose-Capillary: 113 mg/dL — ABNORMAL HIGH (ref 70–99)
Glucose-Capillary: 163 mg/dL — ABNORMAL HIGH (ref 70–99)
Glucose-Capillary: 87 mg/dL (ref 70–99)
Glucose-Capillary: 98 mg/dL (ref 70–99)

## 2022-12-18 MED ORDER — PANTOPRAZOLE SODIUM 40 MG PO TBEC: 40.0000 mg | DELAYED_RELEASE_TABLET | Freq: Every day | ORAL | 0 refills | Status: DC

## 2022-12-18 MED ORDER — SENNOSIDES-DOCUSATE SODIUM 8.6-50 MG PO TABS: 1.0000 | ORAL_TABLET | Freq: Two times a day (BID) | ORAL | 1 refills | Status: DC

## 2022-12-18 MED ORDER — POLYETHYLENE GLYCOL 3350 17 G PO PACK: 17.0000 g | PACK | Freq: Every day | ORAL | 1 refills | Status: AC | PRN

## 2022-12-18 MED ORDER — GERHARDT'S BUTT CREAM: 1.0000 | TOPICAL_CREAM | Freq: Two times a day (BID) | CUTANEOUS | 0 refills | Status: AC

## 2022-12-18 MED ORDER — ENSURE ENLIVE PO LIQD: 237.0000 mL | Freq: Two times a day (BID) | ORAL | 0 refills | Status: AC

## 2022-12-18 NOTE — Discharge Summary (Signed)
Physician Discharge Summary  Lindsey Mcguire JXB:[id number] DOB: 03-01-1935  PCP: Lorenda Ishihara, MD  Admitted from: Home Discharged to: SNF, private pay by family  Admit date: 11/27/2022 Discharge date: 12/18/2022  Recommendations for Outpatient Follow-up:    Follow-up Information     Lorenda Ishihara, MD Follow up in 1 week(s).   Specialty: Internal Medicine Why: To be seen with repeat labs (CBC & BMP). Contact information: [address] [place] [address] [phone]                  Home Health: None    Equipment/Devices: TBD at [facility].    Discharge Condition: Improved and stable.  Overall prognosis appears to be guarded.   Code Status: DNR Diet recommendation:  Discharge Diet Orders (From admission, onward)     Start     Ordered   12/18/22 0000  Diet - low sodium heart healthy       Comments: DIET DYS 3 Room service appropriate? Yes; Fluid consistency: Thin   12/18/22 1324             Discharge Diagnoses:  Principal Problem:   Stercoral colitis Active Problems:   Failure to thrive in adult   Type 2 diabetes mellitus with stage 4 chronic kidney disease, without long-term current use of insulin (HCC)   Hypotension   (HFpEF) heart failure with preserved ejection fraction (HCC)   Hypertension   Dementia (HCC)   Hypothyroidism   Hyperlipidemia   Fecal impaction (HCC)   Chronic constipation   Chronic kidney disease (CKD), stage IV (severe) (HCC)   Hyperglycemia   UTI (urinary tract infection)   Brief Summary: 88YOF with hypertension, hyperlipidemia, hypothyroidism, dementia, HFpEF, type 2 diabetes mellitus, anemia, stage IV CKD, osteoporosis who had recently been discharged on 10/28/2022 from [facility] after being treated for dehydration, deconditioning, failure to thrive, weakness, sepsis from cystitis and was subsequently discharged to SNF and remained in therapy for 2 weeks subsequently discharged home.  Her  husband recently passed away.  She lives with her son and grandson.  Family reported that she has been having diarrhea for the last several days and noticed some blood in her stool.  They brought her to the ED for further evaluation. Noted to be extremely debilitated,cachectic and had severe fecal impaction with a large stool ball which required disimpaction in the ED and stercoral colitis noted. She was also noted to be hypotensive and dehydrated and eventually required low-dose norepinephrine infusion to maintain blood pressure. She was started on antibiotic therapy and admission requested for further management. Patient was treated for sepsis due to UTI and stercoral colitis, transferred out of ICU 8/4. 8/4-noticed some blood per rectum heparin DVT prophylaxis discontinued CBC ordered and GI was consulted No plan for endoscopic intervention at this time GI has discussed with family consider flex sigmoidoscopy for recurrent bleeding, advised to continue Cort enema for 2 weeks. Peer to peer done 12/11/22- per medical director from [facility], last PT note that day reiewed>pt has been unable to progress with gait and not able to participate and resisting, medical director denied snf>? Consider temporary NH under custodial care then PTOT on medicare part B advised- once willing to participate then can try SN, if good support then [facility]. As per TOC update, patient being discharged to SNF as private pay.  Assessment and Plan:   Fecal impaction, Chronic constipation and Stercoral colitis: Resolved. Continue MiraLAX as needed and stool softeners twice daily.  Completed course  of hydrocortisone enemas (was supposed to stop on 8/19).   Hyperglycemia with type 2 diabetes mellitus with stage 4 chronic kidney disease,  without long-term current use of insulin: resolved Off sliding scale insulin due to hypoglycemia. CBGs well-controlled off meds. A1c 6 on 11/27/2022.   Hypotension: resolved    UTI (urinary tract  infection): Antibiotics completed.   (HFpEF) heart failure with preserved ejection fraction (HCC) No signs of overload.  Clinically euvolemic.   Hypertension: controlled Not on maintenance antihypertensives   Hypothyroidism: Continue levothyroxine 88 mcg p.o. daily.   Hyperlipidemia: Continue atorvastatin 40 mg p.o. daily.   Chronic kidney disease (CKD), stage 3A (HCC) Although presented with GFR of 38 8, this has progressively improved with GFR in the last 4 days of 45 or above.  Creatinine has been in the 1.0-1.1 range.  This is not stage IV but stage IIIa CKD. Follow BMP closely as outpatient.   Dementia (HCC) Failure to thrive in adult Supportive care. Continue aspiration, delirium and fall precautions Could consider outpatient palliative care consultation to address goals of care.  Consultations: [facility] Palliative care medicine  Procedures: As noted above   Discharge Instructions  Discharge Instructions     Call MD for:  difficulty breathing, headache or visual disturbances   Complete by: As directed    Call MD for:  extreme fatigue   Complete by: As directed    Call MD for:  persistant dizziness or light-headedness   Complete by: As directed    Call MD for:  persistant nausea and vomiting   Complete by: As directed    Call MD for:  redness, tenderness, or signs of infection (pain, swelling, redness, odor or green/yellow discharge around incision site)   Complete by: As directed    Call MD for:  severe uncontrolled pain   Complete by: As directed    Call MD for:  temperature >100.4   Complete by: As directed    Diet - low sodium heart healthy   Complete by: As directed    DIET DYS 3 Room service appropriate? Yes; Fluid consistency: Thin   Increase activity slowly   Complete by: As directed    No wound care   Complete by: As directed         Medication List     TAKE these medications    atorvastatin 40 MG tablet Commonly known as:  LIPITOR Take 40 mg by mouth daily.   calcium carbonate 600 MG Tabs tablet Commonly known as: OS-CAL Take 600 mg by mouth daily.   Centrum Silver Women 50+ Tabs Take 1 tablet by mouth daily.   feeding supplement Liqd Take 237 mLs by mouth 2 (two) times daily between meals for 10 days.   ferrous sulfate 325 (65 FE) MG tablet Take 325 mg by mouth daily with breakfast.   Gerhardt's butt cream Crea Apply 1 Application topically 2 (two) times daily for 10 days.   levothyroxine 88 MCG tablet Commonly known as: SYNTHROID Take 88 mcg by mouth daily.   pantoprazole 40 MG tablet Commonly known as: PROTONIX Take 1 tablet (40 mg total) by mouth daily.   polyethylene glycol 17 g packet Commonly known as: MIRALAX / GLYCOLAX Take 17 g by mouth daily as needed for moderate constipation.   senna-docusate 8.6-50 MG tablet Commonly known as: Senokot-S Take 1 tablet by mouth 2 (two) times daily.   VITAMIN D-3 PO Take 1 tablet by mouth daily.       Allergies  Allergen Reactions   Amaryl [Glimepiride] Other (See Comments)    Upset stomach   Avandia [Rosiglitazone] Other (See Comments)    Loss of taste and sense of smell      Procedures/Studies: VAS [place] UPPER EXTREMITY VENOUS DUPLEX  Result Date: 12/02/2022 UPPER VENOUS STUDY  Patient Name:  Lindsey Mcguire  Date of Exam:   12/01/2022 Medical Rec #: [id number]          Accession #:    [id number] Date of Birth: Feb 09, 1935          Patient Gender: F Patient Age:   39 years Exam Location:  [facility] Procedure:      VAS [place] UPPER EXTREMITY VENOUS DUPLEX Referring Phys: A POWELL JR --------------------------------------------------------------------------------  Indications: Swelling Limitations: Patient unable to cooperate due to altered status (combative). Comparison Study: No previous exams Performing Technologist: Jody Hill RVT, RDMS  Examination Guidelines: A complete evaluation includes B-mode imaging, spectral Doppler, color  Doppler, and power Doppler as needed of all accessible portions of each vessel. Bilateral testing is considered an integral part of a complete examination. Limited examinations for reoccurring indications may be performed as noted.  Right Findings: +----------+------------+---------+-----------+----------+--------------+ RIGHT     CompressiblePhasicitySpontaneousProperties   Summary     +----------+------------+---------+-----------+----------+--------------+ IJV           Full       Yes       Yes                             +----------+------------+---------+-----------+----------+--------------+ Subclavian    Full       Yes       Yes                             +----------+------------+---------+-----------+----------+--------------+ Axillary      Full       Yes       Yes                             +----------+------------+---------+-----------+----------+--------------+ Brachial      Full       Yes       Yes                             +----------+------------+---------+-----------+----------+--------------+ Radial                                              Not visualized +----------+------------+---------+-----------+----------+--------------+ Ulnar                                               Not visualized +----------+------------+---------+-----------+----------+--------------+ Cephalic                                            Not visualized +----------+------------+---------+-----------+----------+--------------+ Basilic       Full       Yes       Yes                             +----------+------------+---------+-----------+----------+--------------+  Patient combative - unable to visualize radial or ulnar veins  Left Findings: +----------+------------+---------+-----------+----------+-------+ LEFT      CompressiblePhasicitySpontaneousPropertiesSummary +----------+------------+---------+-----------+----------+-------+ Subclavian     Full       Yes       Yes                      +----------+------------+---------+-----------+----------+-------+  Summary:  Right: No evidence of deep vein thrombosis in the upper extremity. No evidence of superficial vein thrombosis in the upper extremity. However, unable to visualize the ulnar and radial veins.  Left: No evidence of thrombosis in the subclavian.  *See table(s) above for measurements and observations.  Diagnosing physician: Gerarda Fraction Electronically signed by Gerarda Fraction on 12/02/2022 at 7:45:05 PM.    Final    CT ABDOMEN PELVIS WO CONTRAST  Result Date: 11/27/2022 CLINICAL DATA:  Acute abdominal pain with diarrhea. EXAM: CT ABDOMEN AND PELVIS WITHOUT CONTRAST TECHNIQUE: Multidetector CT imaging of the abdomen and pelvis was performed following the standard protocol without IV contrast. RADIATION DOSE REDUCTION: This exam was performed according to the departmental dose-optimization program which includes automated exposure control, adjustment of the mA and/or kV according to patient size and/or use of iterative reconstruction technique. COMPARISON:  CT abdomen and pelvis 05/15/2010 FINDINGS: Lower chest: There is atelectasis in the left lung base. Hepatobiliary: No focal liver abnormality is seen. No gallstones, gallbladder wall thickening, or biliary dilatation. Pancreas: Atrophic.  No acute findings. Spleen: Normal in size without focal abnormality. Adrenals/Urinary Tract: There is a rounded 6 cm cyst in the right kidney. There is an inferior pole cyst in the left kidney measuring 12 mm. There is no hydronephrosis or urinary tract calculi. The bilateral adrenal glands and bladder are within normal limits. Stomach/Bowel: The rectum is dilated and stool-filled measuring up to 7.5 cm. There is diffuse rectal wall thickening with presacral edema and mild surrounding inflammation. There is no evidence for bowel obstruction, pneumatosis or free air. There is a moderate amount of stool in  the remaining colon. The appendix is not definitely seen. Small bowel and stomach are within normal limits. Vascular/Lymphatic: Aortic atherosclerosis. No enlarged abdominal or pelvic lymph nodes. Reproductive: Uterus and bilateral adnexa are unremarkable. There are calcifications in the left adnexa. Other: There is moderate elevation of the left hemidiaphragm, unchanged. There is no ascites. There is a small umbilical hernia containing nondilated bowel. Musculoskeletal: Degenerative changes affect the spine. There is chronic compression deformity of T10 which is new from 2012. There is 6 mm of anterolisthesis at L5-S1, new from 2012 and favored as degenerative. The bones are diffusely osteopenic. IMPRESSION: 1. The rectum is dilated and stool-filled with wall thickening and surrounding inflammatory stranding. Findings are concerning for Stercoral colitis/proctitis with fecal impaction. 2. Small umbilical hernia containing nondilated bowel. 3. Right Bosniak I benign renal cyst measuring 6 cm. No follow-up imaging is recommended. JACR 2018 Feb; 264-273, Management of the Incidental Renal Mass on CT, [employer] 2021; 814-848, Bosniak Classification of Cystic Renal Masses, Version 2019. Aortic Atherosclerosis (ICD10-I70.0). Electronically Signed   By: Darliss Cheney M.D.   On: 11/27/2022 21:38      Subjective: Poor historian.  Oriented only to self.  Denies complaints.  As per patient's RN, no acute issues reported.  Discharge Exam:  Vitals:   12/17/22 1547 12/17/22 2003 12/18/22 0520 12/18/22 0825  BP: 137/79 132/69 (!) 154/91 (!) 153/81  Pulse: 98 77 76 72  Resp:  18 18 18   Temp: (!) 96.8 F (  36 C) 97.7 F (36.5 C) 97.6 F (36.4 C) (!) 97.5 F (36.4 C)  TempSrc:  Oral Oral   SpO2: 100% 100% 100% 100%  Weight:   43.1 kg   Height:        General: Elderly female, small built, frail, chronically ill looking, lying crouched in bed, has pulled her gown onto her chest.  Appears comfortable and in  no obvious distress. Cardiovascular: S1 & S2 heard, RRR, S1/S2 +. No murmurs, rubs, gallops or clicks. No JVD or pedal edema. Respiratory: Clear to auscultation without wheezing, rhonchi or crackles. No increased work of breathing. Abdominal:  Non distended, non tender & soft. No organomegaly or masses appreciated. Normal bowel sounds heard. CNS: Alert and oriented only to self. No focal deficits.  Judgment and insight are impaired.  Mood and affect cannot be assessed/flat at this time. Extremities: no edema, no cyanosis    The results of significant diagnostics from this hospitalization (including imaging, microbiology, ancillary and laboratory) are listed below for reference.     Microbiology: No results found for this or any previous visit (from the past 240 hour(s)).   Labs: Last BMP 8/13: Significant for sodium 134, glucose 111, creatinine 1.18, calcium 7.9 and GFR of 45. Last CBC on 8/13: Hemoglobin 9, hematocrit 28.6, platelets 142.  WBC 4.5.  CBG: Recent Labs  Lab 12/17/22 1712 12/17/22 2004 12/18/22 0048 12/18/22 0827 12/18/22 1150  GLUCAP 124* 124* 163* 87 98     Urinalysis    Component Value Date/Time   COLORURINE YELLOW 11/27/2022 2222   APPEARANCEUR CLOUDY (A) 11/27/2022 2222   LABSPEC 1.014 11/27/2022 2222   PHURINE 6.0 11/27/2022 2222   GLUCOSEU NEGATIVE 11/27/2022 2222   HGBUR MODERATE (A) 11/27/2022 2222   BILIRUBINUR NEGATIVE 11/27/2022 2222   KETONESUR NEGATIVE 11/27/2022 2222   PROTEINUR 30 (A) 11/27/2022 2222   UROBILINOGEN 0.2 05/15/2010 1725   NITRITE POSITIVE (A) 11/27/2022 2222   LEUKOCYTESUR LARGE (A) 11/27/2022 2222      Time coordinating discharge: 35 minutes  SIGNED:  Marcellus Scott, MD,  FACP, FHM, [facility], [facility], [facility]   [facility] [employer]      To contact the attending provider between 7A-7P or the covering provider during after hours 7P-7A, please log into the web site [email or web address] and  access using universal [employer] password for that web site. If you do not have the password, please call the hospital operator.

## 2022-12-18 NOTE — Plan of Care (Signed)

## 2022-12-18 NOTE — Discharge Instructions (Signed)

## 2022-12-18 NOTE — TOC Transition Note (Signed)
Transition of Care [facility]) - CM/SW Discharge Note   Patient Details  Name: Lindsey Mcguire MRN: [id number] Date of Birth: 07-14-1934  Transition of Care [facility]) CM/SW Contact:  Arpan Eskelson A [place], LCSWA Phone Number: 12/18/2022, 4:03 PM   Clinical Narrative:     Patient will DC to: [facility]  Anticipated DC date: 12/18/22  Family notified: [name]  Transport by: Sharin Mons      Per MD patient ready for DC to [facility] . RN, patient, patient's family, and facility notified of DC. Discharge Summary and FL2 sent to facility. RN to call report prior to discharge ( [phone], Room 312). DC packet on chart. Ambulance transport requested for patient.     CSW will sign off for now as social work intervention is no longer needed. Please consult [place] again if new needs arise.   Final next level of care: Skilled Nursing Facility Barriers to Discharge: Barriers Resolved   Patient Goals and CMS Choice      Discharge Placement                Patient chooses bed at:  [facility]) Patient to be transferred to facility by: [facility] Name of family member notified: [name] Patient and family notified of of transfer: 12/18/22  Discharge Plan and Services Additional resources added to the After Visit Summary for   In-house Referral: Hospice / Palliative Care                                   Social Determinants of Health (SDOH) Interventions SDOH Screenings   Food Insecurity: Patient Unable To Answer (11/28/2022)  Housing: High Risk (11/28/2022)  Transportation Needs: No Transportation Needs (11/28/2022)  Utilities: Patient Unable To Answer (11/28/2022)  Tobacco Use: Low Risk  (11/27/2022)  Health Literacy: High Risk (10/21/2022)   Received from [facility]     Readmission Risk Interventions     No data to display

## 2022-12-19 DIAGNOSIS — K5909 Other constipation: Secondary | ICD-10-CM | POA: Diagnosis not present

## 2022-12-19 DIAGNOSIS — E785 Hyperlipidemia, unspecified: Secondary | ICD-10-CM | POA: Diagnosis not present

## 2022-12-19 DIAGNOSIS — F0390 Unspecified dementia without behavioral disturbance: Secondary | ICD-10-CM | POA: Diagnosis not present

## 2022-12-19 DIAGNOSIS — I5030 Unspecified diastolic (congestive) heart failure: Secondary | ICD-10-CM | POA: Diagnosis not present

## 2022-12-19 DIAGNOSIS — E039 Hypothyroidism, unspecified: Secondary | ICD-10-CM | POA: Diagnosis not present

## 2022-12-19 DIAGNOSIS — I10 Essential (primary) hypertension: Secondary | ICD-10-CM | POA: Diagnosis not present

## 2022-12-19 DIAGNOSIS — M6281 Muscle weakness (generalized): Secondary | ICD-10-CM | POA: Diagnosis not present

## 2022-12-19 DIAGNOSIS — E1122 Type 2 diabetes mellitus with diabetic chronic kidney disease: Secondary | ICD-10-CM | POA: Diagnosis not present

## 2022-12-19 DIAGNOSIS — E441 Mild protein-calorie malnutrition: Secondary | ICD-10-CM | POA: Diagnosis not present

## 2022-12-19 DIAGNOSIS — R627 Adult failure to thrive: Secondary | ICD-10-CM | POA: Diagnosis not present

## 2022-12-30 DIAGNOSIS — R627 Adult failure to thrive: Secondary | ICD-10-CM | POA: Diagnosis not present

## 2022-12-30 DIAGNOSIS — K5289 Other specified noninfective gastroenteritis and colitis: Secondary | ICD-10-CM | POA: Diagnosis not present

## 2022-12-30 DIAGNOSIS — R5381 Other malaise: Secondary | ICD-10-CM | POA: Diagnosis not present

## 2023-01-08 DIAGNOSIS — M6281 Muscle weakness (generalized): Secondary | ICD-10-CM | POA: Diagnosis not present

## 2023-01-08 DIAGNOSIS — R1311 Dysphagia, oral phase: Secondary | ICD-10-CM | POA: Diagnosis not present

## 2023-01-08 DIAGNOSIS — R41841 Cognitive communication deficit: Secondary | ICD-10-CM | POA: Diagnosis not present

## 2023-01-09 DIAGNOSIS — R1311 Dysphagia, oral phase: Secondary | ICD-10-CM | POA: Diagnosis not present

## 2023-01-09 DIAGNOSIS — R41841 Cognitive communication deficit: Secondary | ICD-10-CM | POA: Diagnosis not present

## 2023-01-09 DIAGNOSIS — M6281 Muscle weakness (generalized): Secondary | ICD-10-CM | POA: Diagnosis not present

## 2023-01-10 DIAGNOSIS — R41841 Cognitive communication deficit: Secondary | ICD-10-CM | POA: Diagnosis not present

## 2023-01-10 DIAGNOSIS — R1311 Dysphagia, oral phase: Secondary | ICD-10-CM | POA: Diagnosis not present

## 2023-01-10 DIAGNOSIS — M6281 Muscle weakness (generalized): Secondary | ICD-10-CM | POA: Diagnosis not present

## 2023-01-14 DIAGNOSIS — R1311 Dysphagia, oral phase: Secondary | ICD-10-CM | POA: Diagnosis not present

## 2023-01-14 DIAGNOSIS — R41841 Cognitive communication deficit: Secondary | ICD-10-CM | POA: Diagnosis not present

## 2023-01-14 DIAGNOSIS — M6281 Muscle weakness (generalized): Secondary | ICD-10-CM | POA: Diagnosis not present

## 2023-01-16 DIAGNOSIS — R1311 Dysphagia, oral phase: Secondary | ICD-10-CM | POA: Diagnosis not present

## 2023-01-16 DIAGNOSIS — R41841 Cognitive communication deficit: Secondary | ICD-10-CM | POA: Diagnosis not present

## 2023-01-16 DIAGNOSIS — M6281 Muscle weakness (generalized): Secondary | ICD-10-CM | POA: Diagnosis not present

## 2023-01-17 DIAGNOSIS — R41841 Cognitive communication deficit: Secondary | ICD-10-CM | POA: Diagnosis not present

## 2023-01-17 DIAGNOSIS — M6281 Muscle weakness (generalized): Secondary | ICD-10-CM | POA: Diagnosis not present

## 2023-01-17 DIAGNOSIS — R1311 Dysphagia, oral phase: Secondary | ICD-10-CM | POA: Diagnosis not present

## 2023-01-18 DIAGNOSIS — R1311 Dysphagia, oral phase: Secondary | ICD-10-CM | POA: Diagnosis not present

## 2023-01-18 DIAGNOSIS — M6281 Muscle weakness (generalized): Secondary | ICD-10-CM | POA: Diagnosis not present

## 2023-01-18 DIAGNOSIS — R41841 Cognitive communication deficit: Secondary | ICD-10-CM | POA: Diagnosis not present

## 2023-01-20 DIAGNOSIS — M6281 Muscle weakness (generalized): Secondary | ICD-10-CM | POA: Diagnosis not present

## 2023-01-20 DIAGNOSIS — R1311 Dysphagia, oral phase: Secondary | ICD-10-CM | POA: Diagnosis not present

## 2023-01-20 DIAGNOSIS — R41841 Cognitive communication deficit: Secondary | ICD-10-CM | POA: Diagnosis not present

## 2023-01-22 DIAGNOSIS — M6281 Muscle weakness (generalized): Secondary | ICD-10-CM | POA: Diagnosis not present

## 2023-01-22 DIAGNOSIS — R41841 Cognitive communication deficit: Secondary | ICD-10-CM | POA: Diagnosis not present

## 2023-01-22 DIAGNOSIS — R1311 Dysphagia, oral phase: Secondary | ICD-10-CM | POA: Diagnosis not present

## 2023-01-22 DIAGNOSIS — E1122 Type 2 diabetes mellitus with diabetic chronic kidney disease: Secondary | ICD-10-CM | POA: Diagnosis not present

## 2023-01-23 DIAGNOSIS — R1311 Dysphagia, oral phase: Secondary | ICD-10-CM | POA: Diagnosis not present

## 2023-01-23 DIAGNOSIS — M6281 Muscle weakness (generalized): Secondary | ICD-10-CM | POA: Diagnosis not present

## 2023-01-23 DIAGNOSIS — R41841 Cognitive communication deficit: Secondary | ICD-10-CM | POA: Diagnosis not present

## 2023-01-24 DIAGNOSIS — M6281 Muscle weakness (generalized): Secondary | ICD-10-CM | POA: Diagnosis not present

## 2023-01-24 DIAGNOSIS — R41841 Cognitive communication deficit: Secondary | ICD-10-CM | POA: Diagnosis not present

## 2023-01-24 DIAGNOSIS — R1311 Dysphagia, oral phase: Secondary | ICD-10-CM | POA: Diagnosis not present

## 2023-01-27 DIAGNOSIS — R41841 Cognitive communication deficit: Secondary | ICD-10-CM | POA: Diagnosis not present

## 2023-01-27 DIAGNOSIS — M6281 Muscle weakness (generalized): Secondary | ICD-10-CM | POA: Diagnosis not present

## 2023-01-27 DIAGNOSIS — R1311 Dysphagia, oral phase: Secondary | ICD-10-CM | POA: Diagnosis not present

## 2023-01-28 DIAGNOSIS — M6281 Muscle weakness (generalized): Secondary | ICD-10-CM | POA: Diagnosis not present

## 2023-03-03 DIAGNOSIS — E785 Hyperlipidemia, unspecified: Secondary | ICD-10-CM | POA: Diagnosis not present

## 2023-03-03 DIAGNOSIS — I509 Heart failure, unspecified: Secondary | ICD-10-CM | POA: Diagnosis not present

## 2023-03-03 DIAGNOSIS — E039 Hypothyroidism, unspecified: Secondary | ICD-10-CM | POA: Diagnosis not present

## 2023-03-05 DIAGNOSIS — F0390 Unspecified dementia without behavioral disturbance: Secondary | ICD-10-CM | POA: Diagnosis not present

## 2023-03-05 DIAGNOSIS — F419 Anxiety disorder, unspecified: Secondary | ICD-10-CM | POA: Diagnosis not present

## 2023-03-12 DIAGNOSIS — F0390 Unspecified dementia without behavioral disturbance: Secondary | ICD-10-CM | POA: Diagnosis not present

## 2023-03-12 DIAGNOSIS — F419 Anxiety disorder, unspecified: Secondary | ICD-10-CM | POA: Diagnosis not present

## 2023-03-19 DIAGNOSIS — F419 Anxiety disorder, unspecified: Secondary | ICD-10-CM | POA: Diagnosis not present

## 2023-03-19 DIAGNOSIS — F0390 Unspecified dementia without behavioral disturbance: Secondary | ICD-10-CM | POA: Diagnosis not present

## 2023-04-02 DIAGNOSIS — F419 Anxiety disorder, unspecified: Secondary | ICD-10-CM | POA: Diagnosis not present

## 2023-04-02 DIAGNOSIS — F0390 Unspecified dementia without behavioral disturbance: Secondary | ICD-10-CM | POA: Diagnosis not present

## 2023-04-03 DIAGNOSIS — M6281 Muscle weakness (generalized): Secondary | ICD-10-CM | POA: Diagnosis not present

## 2023-04-03 DIAGNOSIS — R278 Other lack of coordination: Secondary | ICD-10-CM | POA: Diagnosis not present

## 2023-04-04 DIAGNOSIS — M6281 Muscle weakness (generalized): Secondary | ICD-10-CM | POA: Diagnosis not present

## 2023-04-04 DIAGNOSIS — R278 Other lack of coordination: Secondary | ICD-10-CM | POA: Diagnosis not present

## 2023-04-08 DIAGNOSIS — M6281 Muscle weakness (generalized): Secondary | ICD-10-CM | POA: Diagnosis not present

## 2023-04-08 DIAGNOSIS — R278 Other lack of coordination: Secondary | ICD-10-CM | POA: Diagnosis not present

## 2023-04-09 DIAGNOSIS — R278 Other lack of coordination: Secondary | ICD-10-CM | POA: Diagnosis not present

## 2023-04-09 DIAGNOSIS — M6281 Muscle weakness (generalized): Secondary | ICD-10-CM | POA: Diagnosis not present

## 2023-04-17 DIAGNOSIS — E785 Hyperlipidemia, unspecified: Secondary | ICD-10-CM | POA: Diagnosis not present

## 2023-04-17 DIAGNOSIS — E039 Hypothyroidism, unspecified: Secondary | ICD-10-CM | POA: Diagnosis not present

## 2023-04-17 DIAGNOSIS — I5030 Unspecified diastolic (congestive) heart failure: Secondary | ICD-10-CM | POA: Diagnosis not present

## 2023-04-17 DIAGNOSIS — I10 Essential (primary) hypertension: Secondary | ICD-10-CM | POA: Diagnosis not present

## 2023-04-17 DIAGNOSIS — I509 Heart failure, unspecified: Secondary | ICD-10-CM | POA: Diagnosis not present

## 2023-04-17 DIAGNOSIS — F0390 Unspecified dementia without behavioral disturbance: Secondary | ICD-10-CM | POA: Diagnosis not present

## 2023-04-28 DIAGNOSIS — M6281 Muscle weakness (generalized): Secondary | ICD-10-CM | POA: Diagnosis not present

## 2023-04-28 DIAGNOSIS — R278 Other lack of coordination: Secondary | ICD-10-CM | POA: Diagnosis not present

## 2023-04-30 DIAGNOSIS — F0390 Unspecified dementia without behavioral disturbance: Secondary | ICD-10-CM | POA: Diagnosis not present

## 2023-04-30 DIAGNOSIS — F419 Anxiety disorder, unspecified: Secondary | ICD-10-CM | POA: Diagnosis not present

## 2023-05-14 DIAGNOSIS — F0390 Unspecified dementia without behavioral disturbance: Secondary | ICD-10-CM | POA: Diagnosis not present

## 2023-05-14 DIAGNOSIS — F419 Anxiety disorder, unspecified: Secondary | ICD-10-CM | POA: Diagnosis not present

## 2023-05-28 DIAGNOSIS — E039 Hypothyroidism, unspecified: Secondary | ICD-10-CM | POA: Diagnosis not present

## 2023-05-28 DIAGNOSIS — E559 Vitamin D deficiency, unspecified: Secondary | ICD-10-CM | POA: Diagnosis not present

## 2023-05-28 DIAGNOSIS — I5030 Unspecified diastolic (congestive) heart failure: Secondary | ICD-10-CM | POA: Diagnosis not present

## 2023-05-28 DIAGNOSIS — E1122 Type 2 diabetes mellitus with diabetic chronic kidney disease: Secondary | ICD-10-CM | POA: Diagnosis not present

## 2023-05-28 DIAGNOSIS — E785 Hyperlipidemia, unspecified: Secondary | ICD-10-CM | POA: Diagnosis not present

## 2023-05-29 DIAGNOSIS — R5381 Other malaise: Secondary | ICD-10-CM | POA: Diagnosis not present

## 2023-05-29 DIAGNOSIS — E1122 Type 2 diabetes mellitus with diabetic chronic kidney disease: Secondary | ICD-10-CM | POA: Diagnosis not present

## 2023-05-29 DIAGNOSIS — E039 Hypothyroidism, unspecified: Secondary | ICD-10-CM | POA: Diagnosis not present

## 2023-06-04 ENCOUNTER — Encounter (HOSPITAL_COMMUNITY): Payer: Self-pay | Admitting: Emergency Medicine

## 2023-06-04 ENCOUNTER — Inpatient Hospital Stay (HOSPITAL_COMMUNITY)
Admission: EM | Admit: 2023-06-04 | Discharge: 2023-06-08 | DRG: 689 | Disposition: A | Discharge status: Home Health | Payer: Medicare PPO | Attending: Family Medicine | Admitting: Family Medicine

## 2023-06-04 ENCOUNTER — Other Ambulatory Visit: Payer: Self-pay

## 2023-06-04 DIAGNOSIS — E782 Mixed hyperlipidemia: Secondary | ICD-10-CM | POA: Diagnosis present

## 2023-06-04 DIAGNOSIS — Z79890 Hormone replacement therapy (postmenopausal): Secondary | ICD-10-CM | POA: Diagnosis not present

## 2023-06-04 DIAGNOSIS — I5030 Unspecified diastolic (congestive) heart failure: Secondary | ICD-10-CM | POA: Diagnosis not present

## 2023-06-04 DIAGNOSIS — M810 Age-related osteoporosis without current pathological fracture: Secondary | ICD-10-CM | POA: Diagnosis present

## 2023-06-04 DIAGNOSIS — I5032 Chronic diastolic (congestive) heart failure: Secondary | ICD-10-CM | POA: Diagnosis present

## 2023-06-04 DIAGNOSIS — E039 Hypothyroidism, unspecified: Secondary | ICD-10-CM | POA: Diagnosis present

## 2023-06-04 DIAGNOSIS — Z79899 Other long term (current) drug therapy: Secondary | ICD-10-CM | POA: Diagnosis not present

## 2023-06-04 DIAGNOSIS — E8721 Acute metabolic acidosis: Secondary | ICD-10-CM | POA: Diagnosis present

## 2023-06-04 DIAGNOSIS — N184 Chronic kidney disease, stage 4 (severe): Secondary | ICD-10-CM | POA: Diagnosis present

## 2023-06-04 DIAGNOSIS — Z809 Family history of malignant neoplasm, unspecified: Secondary | ICD-10-CM

## 2023-06-04 DIAGNOSIS — Z882 Allergy status to sulfonamides status: Secondary | ICD-10-CM

## 2023-06-04 DIAGNOSIS — N179 Acute kidney failure, unspecified: Secondary | ICD-10-CM | POA: Diagnosis present

## 2023-06-04 DIAGNOSIS — D631 Anemia in chronic kidney disease: Secondary | ICD-10-CM | POA: Diagnosis present

## 2023-06-04 DIAGNOSIS — I130 Hypertensive heart and chronic kidney disease with heart failure and stage 1 through stage 4 chronic kidney disease, or unspecified chronic kidney disease: Secondary | ICD-10-CM | POA: Diagnosis present

## 2023-06-04 DIAGNOSIS — R4182 Altered mental status, unspecified: Secondary | ICD-10-CM | POA: Diagnosis present

## 2023-06-04 DIAGNOSIS — Z66 Do not resuscitate: Secondary | ICD-10-CM | POA: Diagnosis present

## 2023-06-04 DIAGNOSIS — Z833 Family history of diabetes mellitus: Secondary | ICD-10-CM | POA: Diagnosis not present

## 2023-06-04 DIAGNOSIS — E860 Dehydration: Secondary | ICD-10-CM | POA: Diagnosis present

## 2023-06-04 DIAGNOSIS — Z8249 Family history of ischemic heart disease and other diseases of the circulatory system: Secondary | ICD-10-CM

## 2023-06-04 DIAGNOSIS — D696 Thrombocytopenia, unspecified: Secondary | ICD-10-CM | POA: Diagnosis present

## 2023-06-04 DIAGNOSIS — E1122 Type 2 diabetes mellitus with diabetic chronic kidney disease: Secondary | ICD-10-CM | POA: Diagnosis present

## 2023-06-04 DIAGNOSIS — R627 Adult failure to thrive: Secondary | ICD-10-CM | POA: Diagnosis present

## 2023-06-04 DIAGNOSIS — K219 Gastro-esophageal reflux disease without esophagitis: Secondary | ICD-10-CM | POA: Diagnosis present

## 2023-06-04 DIAGNOSIS — F0390 Unspecified dementia without behavioral disturbance: Secondary | ICD-10-CM | POA: Diagnosis present

## 2023-06-04 DIAGNOSIS — N3000 Acute cystitis without hematuria: Secondary | ICD-10-CM | POA: Diagnosis present

## 2023-06-04 DIAGNOSIS — Z515 Encounter for palliative care: Secondary | ICD-10-CM | POA: Diagnosis not present

## 2023-06-04 DIAGNOSIS — E1165 Type 2 diabetes mellitus with hyperglycemia: Secondary | ICD-10-CM | POA: Diagnosis not present

## 2023-06-04 DIAGNOSIS — R400 Somnolence: Secondary | ICD-10-CM | POA: Diagnosis not present

## 2023-06-04 DIAGNOSIS — Z888 Allergy status to other drugs, medicaments and biological substances status: Secondary | ICD-10-CM

## 2023-06-04 DIAGNOSIS — Z7189 Other specified counseling: Secondary | ICD-10-CM | POA: Diagnosis not present

## 2023-06-04 DIAGNOSIS — I10 Essential (primary) hypertension: Secondary | ICD-10-CM | POA: Diagnosis not present

## 2023-06-04 DIAGNOSIS — G9341 Metabolic encephalopathy: Principal | ICD-10-CM | POA: Diagnosis present

## 2023-06-04 DIAGNOSIS — R531 Weakness: Secondary | ICD-10-CM | POA: Diagnosis not present

## 2023-06-04 LAB — COMPREHENSIVE METABOLIC PANEL
ALT: 39 U/L (ref 0–44)
AST: 41 U/L (ref 15–41)
Albumin: 3.5 g/dL (ref 3.5–5.0)
Alkaline Phosphatase: 93 U/L (ref 38–126)
Anion gap: 9 (ref 5–15)
BUN: 42 mg/dL — ABNORMAL HIGH (ref 8–23)
CO2: 24 mmol/L (ref 22–32)
Calcium: 9.6 mg/dL (ref 8.9–10.3)
Chloride: 106 mmol/L (ref 98–111)
Creatinine, Ser: 1.59 mg/dL — ABNORMAL HIGH (ref 0.44–1.00)
GFR, Estimated: 31 mL/min — ABNORMAL LOW (ref >60)
Glucose, Bld: 101 mg/dL — ABNORMAL HIGH (ref 70–99)
Potassium: 4.5 mmol/L (ref 3.5–5.1)
Sodium: 139 mmol/L (ref 135–145)
Total Bilirubin: 0.5 mg/dL (ref 0.0–1.2)
Total Protein: 7.2 g/dL (ref 6.5–8.1)

## 2023-06-04 LAB — CBC WITH DIFFERENTIAL/PLATELET
Abs Immature Granulocytes: 0.01 10*3/uL (ref 0.00–0.07)
Basophils Absolute: 0 10*3/uL (ref 0.0–0.1)
Basophils Relative: 0 %
Eosinophils Absolute: 0 10*3/uL (ref 0.0–0.5)
Eosinophils Relative: 0 %
HCT: 39.2 % (ref 36.0–46.0)
Hemoglobin: 12.5 g/dL (ref 12.0–15.0)
Immature Granulocytes: 0 %
Lymphocytes Relative: 15 %
Lymphs Abs: 0.9 10*3/uL (ref 0.7–4.0)
MCH: 27.4 pg (ref 26.0–34.0)
MCHC: 31.9 g/dL (ref 30.0–36.0)
MCV: 86 fL (ref 80.0–100.0)
Monocytes Absolute: 0.4 10*3/uL (ref 0.1–1.0)
Monocytes Relative: 7 %
Neutro Abs: 4.5 10*3/uL (ref 1.7–7.7)
Neutrophils Relative %: 78 %
Platelets: 148 10*3/uL — ABNORMAL LOW (ref 150–400)
RBC: 4.56 MIL/uL (ref 3.87–5.11)
RDW: 14 % (ref 11.5–15.5)
WBC: 5.8 10*3/uL (ref 4.0–10.5)
nRBC: 0 % (ref 0.0–0.2)

## 2023-06-04 NOTE — ED Triage Notes (Signed)
 Pt's family called ems because they think she sleeping too much and  not responding like normal.

## 2023-06-04 NOTE — ED Notes (Signed)
 Patient was brought home from [facility] on 05/29/2013

## 2023-06-04 NOTE — ED Notes (Addendum)
 Pt son endorses that the pt is incontinent of urine and is he was noticing a strong odor for her last couple of brief changes. He also endorses that the pt is not able to walk on her own she uses a wheelchair at home. Pts son also endorses that the pt has a Hx of Dementia.

## 2023-06-04 NOTE — ED Notes (Signed)
 Hx of dementia with disturbance and failure to thrive.

## 2023-06-05 ENCOUNTER — Encounter (HOSPITAL_COMMUNITY): Payer: Self-pay | Admitting: Internal Medicine

## 2023-06-05 DIAGNOSIS — Z79890 Hormone replacement therapy (postmenopausal): Secondary | ICD-10-CM | POA: Diagnosis not present

## 2023-06-05 DIAGNOSIS — D696 Thrombocytopenia, unspecified: Secondary | ICD-10-CM | POA: Diagnosis present

## 2023-06-05 DIAGNOSIS — F0390 Unspecified dementia without behavioral disturbance: Secondary | ICD-10-CM

## 2023-06-05 DIAGNOSIS — G9341 Metabolic encephalopathy: Secondary | ICD-10-CM | POA: Diagnosis present

## 2023-06-05 DIAGNOSIS — I5032 Chronic diastolic (congestive) heart failure: Secondary | ICD-10-CM | POA: Diagnosis present

## 2023-06-05 DIAGNOSIS — E782 Mixed hyperlipidemia: Secondary | ICD-10-CM | POA: Diagnosis present

## 2023-06-05 DIAGNOSIS — Z79899 Other long term (current) drug therapy: Secondary | ICD-10-CM | POA: Diagnosis not present

## 2023-06-05 DIAGNOSIS — E1165 Type 2 diabetes mellitus with hyperglycemia: Secondary | ICD-10-CM

## 2023-06-05 DIAGNOSIS — Z7189 Other specified counseling: Secondary | ICD-10-CM | POA: Diagnosis not present

## 2023-06-05 DIAGNOSIS — R400 Somnolence: Secondary | ICD-10-CM | POA: Diagnosis not present

## 2023-06-05 DIAGNOSIS — E860 Dehydration: Secondary | ICD-10-CM

## 2023-06-05 DIAGNOSIS — N3000 Acute cystitis without hematuria: Secondary | ICD-10-CM | POA: Diagnosis present

## 2023-06-05 DIAGNOSIS — Z882 Allergy status to sulfonamides status: Secondary | ICD-10-CM | POA: Diagnosis not present

## 2023-06-05 DIAGNOSIS — R4182 Altered mental status, unspecified: Secondary | ICD-10-CM

## 2023-06-05 DIAGNOSIS — I130 Hypertensive heart and chronic kidney disease with heart failure and stage 1 through stage 4 chronic kidney disease, or unspecified chronic kidney disease: Secondary | ICD-10-CM | POA: Diagnosis present

## 2023-06-05 DIAGNOSIS — I5030 Unspecified diastolic (congestive) heart failure: Secondary | ICD-10-CM | POA: Diagnosis not present

## 2023-06-05 DIAGNOSIS — R627 Adult failure to thrive: Secondary | ICD-10-CM | POA: Diagnosis present

## 2023-06-05 DIAGNOSIS — E8721 Acute metabolic acidosis: Secondary | ICD-10-CM | POA: Diagnosis present

## 2023-06-05 DIAGNOSIS — Z515 Encounter for palliative care: Secondary | ICD-10-CM | POA: Diagnosis not present

## 2023-06-05 DIAGNOSIS — Z66 Do not resuscitate: Secondary | ICD-10-CM | POA: Diagnosis present

## 2023-06-05 DIAGNOSIS — Z8249 Family history of ischemic heart disease and other diseases of the circulatory system: Secondary | ICD-10-CM | POA: Diagnosis not present

## 2023-06-05 DIAGNOSIS — N179 Acute kidney failure, unspecified: Secondary | ICD-10-CM

## 2023-06-05 DIAGNOSIS — K219 Gastro-esophageal reflux disease without esophagitis: Secondary | ICD-10-CM

## 2023-06-05 DIAGNOSIS — N184 Chronic kidney disease, stage 4 (severe): Secondary | ICD-10-CM | POA: Diagnosis present

## 2023-06-05 DIAGNOSIS — E039 Hypothyroidism, unspecified: Secondary | ICD-10-CM

## 2023-06-05 DIAGNOSIS — I10 Essential (primary) hypertension: Secondary | ICD-10-CM

## 2023-06-05 DIAGNOSIS — E1122 Type 2 diabetes mellitus with diabetic chronic kidney disease: Secondary | ICD-10-CM | POA: Diagnosis present

## 2023-06-05 DIAGNOSIS — M810 Age-related osteoporosis without current pathological fracture: Secondary | ICD-10-CM | POA: Diagnosis present

## 2023-06-05 DIAGNOSIS — Z833 Family history of diabetes mellitus: Secondary | ICD-10-CM | POA: Diagnosis not present

## 2023-06-05 DIAGNOSIS — N189 Chronic kidney disease, unspecified: Secondary | ICD-10-CM | POA: Insufficient documentation

## 2023-06-05 DIAGNOSIS — N1832 Chronic kidney disease, stage 3b: Secondary | ICD-10-CM

## 2023-06-05 DIAGNOSIS — D631 Anemia in chronic kidney disease: Secondary | ICD-10-CM | POA: Diagnosis present

## 2023-06-05 LAB — GLUCOSE, CAPILLARY
Glucose-Capillary: 90 mg/dL (ref 70–99)
Glucose-Capillary: 73 mg/dL (ref 70–99)
Glucose-Capillary: 89 mg/dL (ref 70–99)

## 2023-06-05 LAB — CBC
HCT: 37.8 % (ref 36.0–46.0)
Hemoglobin: 12.1 g/dL (ref 12.0–15.0)
MCH: 27.8 pg (ref 26.0–34.0)
MCHC: 32 g/dL (ref 30.0–36.0)
MCV: 86.7 fL (ref 80.0–100.0)
Platelets: 145 10*3/uL — ABNORMAL LOW (ref 150–400)
RBC: 4.36 MIL/uL (ref 3.87–5.11)
RDW: 13.9 % (ref 11.5–15.5)
WBC: 7 10*3/uL (ref 4.0–10.5)
nRBC: 0 % (ref 0.0–0.2)

## 2023-06-05 LAB — PHOSPHORUS: Phosphorus: 2.5 mg/dL (ref 2.5–4.6)

## 2023-06-05 LAB — URINALYSIS, ROUTINE W REFLEX MICROSCOPIC
Bilirubin Urine: NEGATIVE
Glucose, UA: NEGATIVE mg/dL
Hgb urine dipstick: NEGATIVE
Ketones, ur: 5 mg/dL — AB
Nitrite: NEGATIVE
Protein, ur: 100 mg/dL — AB
Specific Gravity, Urine: 1.015 (ref 1.005–1.030)
pH: 7 (ref 5.0–8.0)

## 2023-06-05 LAB — COMPREHENSIVE METABOLIC PANEL
ALT: 35 U/L (ref 0–44)
AST: 31 U/L (ref 15–41)
Albumin: 3.3 g/dL — ABNORMAL LOW (ref 3.5–5.0)
Alkaline Phosphatase: 91 U/L (ref 38–126)
Anion gap: 8 (ref 5–15)
BUN: 39 mg/dL — ABNORMAL HIGH (ref 8–23)
CO2: 23 mmol/L (ref 22–32)
Calcium: 9 mg/dL (ref 8.9–10.3)
Chloride: 107 mmol/L (ref 98–111)
Creatinine, Ser: 1.35 mg/dL — ABNORMAL HIGH (ref 0.44–1.00)
GFR, Estimated: 38 mL/min — ABNORMAL LOW (ref >60)
Glucose, Bld: 87 mg/dL (ref 70–99)
Potassium: 4.2 mmol/L (ref 3.5–5.1)
Sodium: 138 mmol/L (ref 135–145)
Total Bilirubin: 0.6 mg/dL (ref 0.0–1.2)
Total Protein: 7 g/dL (ref 6.5–8.1)

## 2023-06-05 LAB — MAGNESIUM: Magnesium: 2 mg/dL (ref 1.7–2.4)

## 2023-06-05 LAB — HEMOGLOBIN A1C
Hgb A1c MFr Bld: 6.8 % — ABNORMAL HIGH (ref 4.8–5.6)
Mean Plasma Glucose: 148.46 mg/dL

## 2023-06-05 MED ORDER — HYDRALAZINE HCL 20 MG/ML IJ SOLN: 10.0000 mg | Freq: Four times a day (QID) | INTRAMUSCULAR | Status: DC | PRN

## 2023-06-05 MED ORDER — INSULIN ASPART 100 UNIT/ML IJ SOLN
0.0000 [IU] | Freq: Three times a day (TID) | INTRAMUSCULAR | Status: DC
  Administered 2023-06-07: 1 [IU] via SUBCUTANEOUS

## 2023-06-05 MED ORDER — SODIUM CHLORIDE 0.9 % IV SOLN: INTRAVENOUS | Status: AC

## 2023-06-05 MED ORDER — PANTOPRAZOLE SODIUM 40 MG PO TBEC
40.0000 mg | DELAYED_RELEASE_TABLET | Freq: Every day | ORAL | Status: DC
  Administered 2023-06-07: 40 mg via ORAL
  Filled 2023-06-05 (×3): qty 1

## 2023-06-05 MED ORDER — ENSURE ENLIVE PO LIQD
237.0000 mL | Freq: Two times a day (BID) | ORAL | Status: DC
  Administered 2023-06-06: 237 mL via ORAL

## 2023-06-05 MED ORDER — ATORVASTATIN CALCIUM 40 MG PO TABS
40.0000 mg | ORAL_TABLET | Freq: Every day | ORAL | Status: DC
  Administered 2023-06-07: 40 mg via ORAL
  Filled 2023-06-05 (×3): qty 1

## 2023-06-05 MED ORDER — SODIUM CHLORIDE 0.9 % IV SOLN
1.0000 g | Freq: Once | INTRAVENOUS | Status: AC
  Administered 2023-06-05: 1 g via INTRAVENOUS
  Filled 2023-06-05: qty 10

## 2023-06-05 MED ORDER — ORAL CARE MOUTH RINSE: 15.0000 mL | OROMUCOSAL | Status: DC | PRN

## 2023-06-05 MED ORDER — HEPARIN SODIUM (PORCINE) 5000 UNIT/ML IJ SOLN
5000.0000 [IU] | Freq: Three times a day (TID) | INTRAMUSCULAR | Status: DC
  Administered 2023-06-05 – 2023-06-08 (×10): 5000 [IU] via SUBCUTANEOUS
  Filled 2023-06-05 (×10): qty 1

## 2023-06-05 MED ORDER — LEVOTHYROXINE SODIUM 88 MCG PO TABS: 88.0000 ug | ORAL_TABLET | Freq: Every day | ORAL | Status: DC

## 2023-06-05 MED ORDER — ONDANSETRON HCL 4 MG/2ML IJ SOLN: 4.0000 mg | Freq: Four times a day (QID) | INTRAMUSCULAR | Status: DC | PRN

## 2023-06-05 MED ORDER — ORAL CARE MOUTH RINSE
15.0000 mL | OROMUCOSAL | Status: DC
  Administered 2023-06-05 – 2023-06-08 (×7): 15 mL via OROMUCOSAL

## 2023-06-05 MED ORDER — SODIUM CHLORIDE 0.9 % IV SOLN
1.0000 g | INTRAVENOUS | Status: DC
  Administered 2023-06-05 – 2023-06-08 (×4): 1 g via INTRAVENOUS
  Filled 2023-06-05 (×4): qty 10

## 2023-06-05 MED ORDER — ONDANSETRON HCL 4 MG PO TABS: 4.0000 mg | ORAL_TABLET | Freq: Four times a day (QID) | ORAL | Status: DC | PRN

## 2023-06-05 MED ORDER — SODIUM CHLORIDE 0.9 % IV BOLUS
500.0000 mL | Freq: Once | INTRAVENOUS | Status: AC
  Administered 2023-06-05: 500 mL via INTRAVENOUS

## 2023-06-05 MED ORDER — ACETAMINOPHEN 325 MG PO TABS: 650.0000 mg | ORAL_TABLET | Freq: Four times a day (QID) | ORAL | Status: DC | PRN

## 2023-06-05 MED ORDER — ACETAMINOPHEN 650 MG RE SUPP: 650.0000 mg | Freq: Four times a day (QID) | RECTAL | Status: DC | PRN

## 2023-06-05 MED ORDER — LEVOTHYROXINE SODIUM 88 MCG PO TABS
88.0000 ug | ORAL_TABLET | Freq: Every day | ORAL | Status: DC
  Administered 2023-06-05 – 2023-06-08 (×2): 88 ug via ORAL
  Filled 2023-06-05 (×3): qty 1

## 2023-06-05 NOTE — Evaluation (Signed)
 Physical Therapy Evaluation Patient Details Name: Lindsey Mcguire MRN: [id number] DOB: 20-Mar-1935 Today's Date: 06/05/2023  History of Present Illness  Lindsey Mcguire is an 89 y.o. female with medical history significant of hypertension, hyperlipidemia, hypothyroidism, dementia, HFpEF, type 2 diabetes mellitus, anemia, stage IV CKD, osteoporosis who presents to the emergency department via EMS from rehab facility due to altered mental status.  She was unable to provide a history possibly due to altered mental status, history was obtained from ED physician and ED medical record.  Per report, patient was noted with increased somnolence today, she was not responding normally per baseline of functioning whereby she was able to converse ( though waxes and wanes).  Urine smell was reported to have been stronger recently.  Patient is nonambulatory at baseline.   Clinical Impression  Upon arrival patient was lethargic and had difficult time keeping eyes open. Patient was apprehensive to therapy. After some encouragement patient agreed to therapy. Max assist was given to perform supine to sit. Whiile seated EOB patient had a posterior lean. After some stretching the lean was not as intense. Patient was able to tolerate sitting EOB and used bilateral UE to hold on to knees to maintain balance. Patient was assisted back to supine as session was concluded due to patient being lethargic. Patient will benefit from continued skilled physical therapy in hospital and recommended venue below to increase strength, balance, endurance for safe ADLs and gait.          If plan is discharge home, recommend the following: A lot of help with bathing/dressing/bathroom;A lot of help with walking and/or transfers;Assistance with cooking/housework;Help with stairs or ramp for entrance   Can travel by private vehicle        Equipment Recommendations None recommended by PT  Recommendations for Other Services        Functional Status Assessment Patient has had a recent decline in their functional status and demonstrates the ability to make significant improvements in function in a reasonable and predictable amount of time.     Precautions / Restrictions Precautions Precautions: Fall Restrictions Weight Bearing Restrictions Per Provider Order: No      Mobility  Bed Mobility Overal bed mobility: Needs Assistance Bed Mobility: Supine to Sit     Supine to sit: Max assist       Patient Response: Restless  Transfers                        Ambulation/Gait                  Stairs            Wheelchair Mobility     Tilt Bed Tilt Bed Patient Response: Restless  Modified Rankin (Stroke Patients Only)       Balance Overall balance assessment: Needs assistance Sitting-balance support: Bilateral upper extremity supported, Feet supported Sitting balance-Leahy Scale: Poor Sitting balance - Comments: Patient continuously tried to lay down Postural control: Posterior lean                                   Pertinent Vitals/Pain Pain Assessment Pain Assessment: No/denies pain    Home Living Family/patient expects to be discharged to:: Private residence Living Arrangements: Children Available Help at Discharge: Family Type of Home: Mobile home Home Access: Stairs to enter Entrance Stairs-Rails: Right;Left;Can reach both Entrance Stairs-Number of Steps: 6  Home Layout: One level Home Equipment: Agricultural [employer] (2 wheels);Cane - single point Additional Comments: Home set up and equipment taken from pt previous hospitalization, information gathered from son and nephew    Prior Function Prior Level of Function : Needs assist  Cognitive Assist : ADLs (cognitive)   ADLs (Cognitive): Step by step cues Physical Assist : ADLs (physical)   ADLs (physical): Bathing;Dressing Mobility Comments: son and nephew help patient ADLs Comments: son and  nephew help patient     Extremity/Trunk Assessment   Upper Extremity Assessment Upper Extremity Assessment: Generalized weakness    Lower Extremity Assessment Lower Extremity Assessment: Generalized weakness       Communication   Communication Communication: Difficulty following commands/understanding;Difficulty communicating thoughts/reduced clarity of speech Following commands: Follows one step commands inconsistently Cueing Techniques: Verbal cues;Tactile cues  Cognition Arousal: Lethargic Behavior During Therapy: Agitated Overall Cognitive Status: History of cognitive impairments - at baseline                                          General Comments      Exercises     Assessment/Plan    PT Assessment Patient needs continued PT services  PT Problem List Decreased strength;Decreased range of motion;Decreased activity tolerance;Decreased balance;Decreased mobility       PT Treatment Interventions DME instruction;Gait training;Patient/family education;Stair training;Functional mobility training;Therapeutic activities;Therapeutic exercise;Balance training    PT Goals (Current goals can be found in the Care Plan section)  Acute Rehab PT Goals Patient Stated Goal: to return home PT Goal Formulation: With patient/family Time For Goal Achievement: 06/19/23 Potential to Achieve Goals: Good    Frequency Min 3X/week     Co-evaluation               AM-PAC PT 6 Clicks Mobility  Outcome Measure Help needed turning from your back to your side while in a flat bed without using bedrails?: A Lot Help needed moving from lying on your back to sitting on the side of a flat bed without using bedrails?: A Lot Help needed moving to and from a bed to a chair (including a wheelchair)?: Total Help needed standing up from a chair using your arms (e.g., wheelchair or bedside chair)?: Total Help needed to walk in hospital room?: Total Help needed climbing  3-5 steps with a railing? : Total 6 Click Score: 8    End of Session   Activity Tolerance: Patient limited by lethargy;Patient tolerated treatment well Patient left: in bed;with call bell/phone within reach;with family/visitor present Nurse Communication: Mobility status PT Visit Diagnosis: Unsteadiness on feet (R26.81);Other abnormalities of gait and mobility (R26.89);Muscle weakness (generalized) (M62.81)    Time: 8872-8845 PT Time Calculation (min) (ACUTE ONLY): 27 min   Charges:   PT Evaluation $PT Eval Moderate Complexity: 1 Mod PT Treatments $Therapeutic Activity: 23-37 mins PT General Charges $$ ACUTE PT VISIT: 1 Visit         Burney Calzadilla SPT

## 2023-06-05 NOTE — Evaluation (Signed)
 Clinical/Bedside Swallow Evaluation Patient Details  Name: Lindsey Mcguire MRN: [id number] Date of Birth: 1934-07-06  Today's Date: 06/05/2023 Time: SLP Start Time (ACUTE ONLY): 1422 SLP Stop Time (ACUTE ONLY): 1454 SLP Time Calculation (min) (ACUTE ONLY): 32 min  Past Medical History:  Past Medical History:  Diagnosis Date   Anemia 10/28/2003   Dementia (HCC)    Diabetes mellitus    Hypertension    Past Surgical History:  Past Surgical History:  Procedure Laterality Date   CATARACT EXTRACTION  12/06/2003   left eye   TUBAL LIGATION  1977   YAG LASER APPLICATION Right 11/09/2013   Procedure: YAG LASER APPLICATION;  Surgeon: Oneil T. Roz, MD;  Location: [facility];  Service: Ophthalmology;  Laterality: Right;   HPI:  Lindsey Mcguire is an 89 y.o. female with medical history significant of hypertension, hyperlipidemia, hypothyroidism, dementia, HFpEF, type 2 diabetes mellitus, anemia, stage IV CKD, osteoporosis who presents to the emergency department via EMS from rehab facility due to altered mental status.  She was unable to provide a history possibly due to altered mental status, history was obtained from ED physician and ED medical record.  Per report, patient was noted with increased somnolence today, she was not responding normally per baseline of functioning whereby she was able to converse ( though waxes and wanes).  Urine smell was reported to have been stronger recently.  Patient is nonambulatory at baseline. Pt recently discharged home from [facility] (last Friday). BSE requested. Family at bedside reports that Pt was on puree at facility, but soft solids at home. They also indicate that she has dementia and is typically awake at night and sleeps during the day and required some assist for feeding.    Assessment / Plan / Recommendation  Clinical Impression  Clinical swallow evaluation completed at bedside with family present. Pt was sleeping upon SLP arrival, which is norm per  family due to dementia (awake at night). Pt occasionally opened her eyes, but did not verbalize with SLP. She accepted ice chip to lip and closed her lip around to manipulate it. SLP proceeded to give Pt PO including water via straw, puree, and mech soft textures. Pt with one episode of strong cough after first sip of straw sips thin water, but did not recurr. Pt mostly with eyes closed during visit, but did manipulate and swallow all boluses presented. Pt exhibited prolonged oral transit of soft textures (diced peaches) and benefited from liquid wash. Recommend D2 and thin liquids with 1:1 feeder assist and PO medications crushed as able in puree. Will keep Pt on caseload for f/u prn. Above to family and RN. SLP Visit Diagnosis: Dysphagia, unspecified (R13.10)    Aspiration Risk  Mild aspiration risk;Risk for inadequate nutrition/hydration    Diet Recommendation Dysphagia 2 (Fine chop);Thin liquid    Liquid Administration via: Cup;Straw Medication Administration: Crushed with puree Supervision: Staff to assist with self feeding;Full supervision/cueing for compensatory strategies Compensations: Slow rate;Small sips/bites Postural Changes: Seated upright at 90 degrees;Remain upright for at least 30 minutes after po intake    Other  Recommendations Oral Care Recommendations: Oral care BID;Staff/trained caregiver to provide oral care    Recommendations for follow up therapy are one component of a multi-disciplinary discharge planning process, led by the attending physician.  Recommendations may be updated based on patient status, additional functional criteria and insurance authorization.  Follow up Recommendations Follow physician's recommendations for discharge plan and follow up therapies      Assistance Recommended  at Discharge    Functional Status Assessment Patient has had a recent decline in their functional status and demonstrates the ability to make significant improvements in function  in a reasonable and predictable amount of time.  Frequency and Duration min 1 x/week  1 week       Prognosis Prognosis for improved oropharyngeal function: Fair Barriers to Reach Goals: Cognitive deficits;Behavior      Swallow Study   General Date of Onset: 06/04/23 HPI: Lindsey Mcguire is an 89 y.o. female with medical history significant of hypertension, hyperlipidemia, hypothyroidism, dementia, HFpEF, type 2 diabetes mellitus, anemia, stage IV CKD, osteoporosis who presents to the emergency department via EMS from rehab facility due to altered mental status.  She was unable to provide a history possibly due to altered mental status, history was obtained from ED physician and ED medical record.  Per report, patient was noted with increased somnolence today, she was not responding normally per baseline of functioning whereby she was able to converse ( though waxes and wanes).  Urine smell was reported to have been stronger recently.  Patient is nonambulatory at baseline. Pt recently discharged home from [facility] (last Friday). BSE requested. Family at bedside reports that Pt was on puree at facility, but soft solids at home. They also indicate that she has dementia and is typically awake at night and sleeps during the day and required some assist for feeding. Type of Study: Bedside Swallow Evaluation Previous Swallow Assessment: BSE June D3/thin Diet Prior to this Study: Regular;Thin liquids (Level 0) Temperature Spikes Noted: No Respiratory Status: Room air History of Recent Intubation: No Behavior/Cognition: Lethargic/Drowsy Oral Cavity Assessment: Within Functional Limits Oral Care Completed by SLP: Recent completion by staff Oral Cavity - Dentition: Poor condition;Missing dentition Vision: Impaired for self-feeding Self-Feeding Abilities: Total assist Patient Positioning: Upright in bed Baseline Vocal Quality: Not observed Volitional Cough: Cognitively unable to  elicit Volitional Swallow: Unable to elicit    Oral/Motor/Sensory Function Overall Oral Motor/Sensory Function:  (Unable to participate but appears WNL)   Ice Chips Ice chips: Within functional limits Presentation: Spoon   Thin Liquid Thin Liquid: Impaired Presentation: Straw Oral Phase Impairments: Poor awareness of bolus Pharyngeal  Phase Impairments: Cough - Delayed    Nectar Thick Nectar Thick Liquid: Not tested   Honey Thick Honey Thick Liquid: Not tested   Puree Puree: Within functional limits Presentation: Spoon   Solid     Solid: Impaired Presentation: Spoon Oral Phase Impairments: Impaired mastication Oral Phase Functional Implications: Prolonged oral transit;Impaired mastication     Thank you,  Lamar Candy, CCC-SLP [phone]  Kushal Saunders 06/05/2023,3:01 PM

## 2023-06-05 NOTE — H&P (Signed)
 History and Physical    Patient: Lindsey Mcguire FMW:[id number] DOB: [birth year] DOA: 06/04/2023 DOS: the patient was seen and examined on 06/05/2023 PCP: Elliot Charm, MD  Patient coming from: SNF  Chief Complaint:  Chief Complaint  Patient presents with   Altered Mental Status   HPI: Lindsey Mcguire is an 89 y.o. female with medical history significant of hypertension, hyperlipidemia, hypothyroidism, dementia, HFpEF, type 2 diabetes mellitus, anemia, stage IV CKD, osteoporosis who presents to the emergency department via EMS from rehab facility due to altered mental status.  She was unable to provide a history possibly due to altered mental status, history was obtained from ED physician and ED medical record.  Per report, patient was noted with increased somnolence today, she was not responding normally per baseline of functioning whereby she was able to converse ( though waxes and wanes).  Urine smell was reported to have been stronger recently.  Patient is nonambulatory at baseline.  ED Course:  In the emergency department, she was hemodynamically stable.  Workup in the ED showed normal CBC except for thrombocytopenia, BMP was normal except for blood glucose of 101, BUN 42, creatinine 1.59 (baseline creatinine at 1.0-1.2).  Urinalysis was positive for moderate leukocytes, 21-50 WBC and a few bacteria. Patient was treated with IV ceftriaxone , IV NS 500 mL was given. Hospitalist was asked to admit patient for further evaluation and management.  Review of Systems: Review of systems as noted in the HPI. All other systems reviewed and are negative.   Past Medical History:  Diagnosis Date   Anemia 10/28/2003   Dementia (HCC)    Diabetes mellitus    Hypertension    Past Surgical History:  Procedure Laterality Date   CATARACT EXTRACTION  12/06/2003   left eye   TUBAL LIGATION  1977   YAG LASER APPLICATION Right 11/09/2013   Procedure: YAG LASER APPLICATION;  Surgeon: Oneil T.  Roz, MD;  Location: [facility];  Service: Ophthalmology;  Laterality: Right;    Social History:  reports that she has never smoked. She has never used smokeless tobacco. She reports that she does not drink alcohol  and does not use drugs.   Allergies  Allergen Reactions   Amaryl  [Glimepiride ] Other (See Comments)    Upset stomach   Avandia [Rosiglitazone] Other (See Comments)    Loss of taste and sense of smell    Family History  Problem Relation Age of Onset   Diabetes Mother    Hypertension Mother    Cancer Brother    Diabetes Brother    Hypertension Maternal Grandmother    Diabetes Maternal Grandmother    Cancer Brother    Diabetes Brother      Prior to Admission medications   Medication Sig Start Date End Date Taking? Authorizing Provider  busPIRone (BUSPAR) 10 MG tablet Take 10 mg by mouth 3 (three) times daily. 05/30/23  Yes [provider]  cholecalciferol (VITAMIN D3) 25 MCG (1000 UNIT) tablet Take 1,000 Units by mouth daily. 05/30/23  Yes [provider]  nystatin cream (MYCOSTATIN) Apply 1 Application topically 3 (three) times daily. 12/18/22  Yes [provider]  omeprazole  (PRILOSEC) 20 MG capsule Take 20 mg by mouth every morning. 05/30/23  Yes [provider]  senna (SENOKOT) 8.6 MG TABS tablet Take 1 tablet by mouth 2 (two) times daily. 05/30/23  Yes [provider]  atorvastatin  (LIPITOR) 40 MG tablet Take 40 mg by mouth daily.    [provider]  calcium  carbonate (  OS-CAL) 600 MG TABS tablet Take 600 mg by mouth daily.    [provider]  ferrous sulfate  325 (65 FE) MG tablet Take 325 mg by mouth daily with breakfast.    [provider]  FLUoxetine (PROZAC) 20 MG capsule Take 20 mg by mouth daily. 12/15/22   [provider]  levothyroxine  (SYNTHROID ) 88 MCG tablet Take 88 mcg by mouth daily.    [provider]  Multiple Vitamins-Minerals (CENTRUM SILVER WOMEN 50+) TABS Take 1  tablet by mouth daily.    [provider]  pantoprazole  (PROTONIX ) 40 MG tablet Take 1 tablet (40 mg total) by mouth daily. 12/18/22   Hongalgi, Anand D, MD  polyethylene glycol (MIRALAX  / GLYCOLAX ) 17 g packet Take 17 g by mouth daily as needed for moderate constipation. 12/18/22   Hongalgi, Anand D, MD  senna-docusate (SENOKOT-S) 8.6-50 MG tablet Take 1 tablet by mouth 2 (two) times daily. 12/18/22   Judeth Trenda BIRCH, MD    Physical Exam: BP (!) 140/77   Pulse 76   Temp 98.3 F (36.8 C)   Resp 15   Ht 5' (1.524 m)   Wt 43 kg   SpO2 100%   BMI 18.51 kg/m   General: 89 y.o. year-old female ill appearing, but in no acute distress.  Alert and awake, but not oriented. HEENT: NCAT, PERRL, dry mucous membrane Neck: Supple, trachea medial Cardiovascular: Regular rate and rhythm with no rubs or gallops.  No thyromegaly or JVD noted.  No lower extremity edema. 2/4 pulses in all 4 extremities. Respiratory: Clear to auscultation with no wheezes or rales. Good inspiratory effort. Abdomen: Soft, nontender nondistended with normal bowel sounds x4 quadrants. Muskuloskeletal: No cyanosis, clubbing or edema noted bilaterally Neuro: Patient moving all extremities.  No focal neurologic deficit. Skin: No ulcerative lesions noted or rashes Psychiatry: Mood is appropriate for condition and setting          Labs on Admission:  Basic Metabolic Panel: Recent Labs  Lab 06/04/23 2201  NA 139  K 4.5  CL 106  CO2 24  GLUCOSE 101*  BUN 42*  CREATININE 1.59*  CALCIUM  9.6   Liver Function Tests: Recent Labs  Lab 06/04/23 2201  AST 41  ALT 39  ALKPHOS 93  BILITOT 0.5  PROT 7.2  ALBUMIN  3.5   No results for input(s): LIPASE, AMYLASE in the last 168 hours. No results for input(s): AMMONIA in the last 168 hours. CBC: Recent Labs  Lab 06/04/23 2201  WBC 5.8  NEUTROABS 4.5  HGB 12.5  HCT 39.2  MCV 86.0  PLT 148*   Cardiac Enzymes: No results for input(s): CKTOTAL,  CKMB, CKMBINDEX, TROPONINI in the last 168 hours.  BNP (last 3 results) No results for input(s): BNP in the last 8760 hours.  ProBNP (last 3 results) No results for input(s): PROBNP in the last 8760 hours.  CBG: No results for input(s): GLUCAP in the last 168 hours.  Radiological Exams on Admission: No results found.  EKG: I independently viewed the EKG done and my findings are as followed: EKG was not done in the ED  Assessment/Plan Present on Admission:  Altered mental status  (HFpEF) heart failure with preserved ejection fraction (HCC)  Dementia (HCC)  Essential hypertension  Mixed hyperlipidemia  Acquired hypothyroidism  Principal Problem:   Altered mental status Active Problems:   (HFpEF) heart failure with preserved ejection fraction (HCC)   Essential hypertension   Dementia (HCC)   Acquired hypothyroidism   Mixed  hyperlipidemia   Acute cystitis   Acute kidney injury superimposed on chronic kidney disease (HCC)   Dehydration   Thrombocytopenia (HCC)  Altered mental status This is possibly secondary to multifactorial Avoid all CNS acting meds at this time Continue fall precaution  Presumed acute cystitis Patient was started on IV ceftriaxone , she will continue with same at this time. Urine culture on 10/27/2017 was positive for E. coli and this was sensitive to ceftriaxone . Urine culture pending  Acute kidney injury superimposed on CKD 3B Dehydration Creatinine 1.59 (baseline creatinine at 1.0-1.2).  Continue gentle hydration Renally adjust medications, avoid nephrotoxic agents/dehydration/hypotension  Chronic thrombocytopenia Platelets 148, stable.  Continue to monitor platelet levels  Type 2 diabetes mellitus with hyperglycemia Hemoglobin A1c on 11/27/2022 was 6.0 Continue ISS and hypoglycemia protocol  (HFpEF) heart failure with preserved ejection fraction  Patient has no signs of fluid overload.  She is clinically euvolemic.    Essential hypertension Patient is not on maintenance antihypertensives Continue IV hydralazine  10 mg every 6 hours as needed   Acquired hypothyroidism Continue levothyroxine     Mixed hyperlipidemia Continue atorvastatin  40 mg p.o. daily.  GERD Continue Protonix    Dementia  Failure to thrive in adult Continue supportive care Continue aspiration, delirium and fall precautions  DVT prophylaxis: Subcu heparin   Code Status: DNR  Family Communication: None at bedside  Consults: None  Severity of Illness: The appropriate patient status for this patient is INPATIENT. Inpatient status is judged to be reasonable and necessary in order to provide the required intensity of service to ensure the patient's safety. The patient's presenting symptoms, physical exam findings, and initial radiographic and laboratory data in the context of their chronic comorbidities is felt to place them at high risk for further clinical deterioration. Furthermore, it is not anticipated that the patient will be medically stable for discharge from the hospital within 2 midnights of admission.   * I certify that at the point of admission it is my clinical judgment that the patient will require inpatient hospital care spanning beyond 2 midnights from the point of admission due to high intensity of service, high risk for further deterioration and high frequency of surveillance required.*  Author: Nicola Quesnell, DO 06/05/2023 5:53 AM  For on call review [email or web address].

## 2023-06-05 NOTE — Progress Notes (Signed)
 Patient admitted earlier this morning for altered mental status and was found to have acute cystitis along with AKI and started on IV antibiotics and fluids.  Patient seen and examined at bedside.  I have reviewed patient's medical records including this morning's H&P, current vitals, labs and medications myself.  Continue IV fluids and antibiotics.  Follow cultures.  Consult palliative care for goals of care discussion.

## 2023-06-05 NOTE — ED Provider Notes (Signed)
 [facility] Emergency Department Provider Note MRN:  [id number]  Arrival date & time: 06/05/23     Chief Complaint   Altered Mental Status   History of Present Illness   Lindsey Mcguire is a 89 y.o. year-old female with a history of hypertension, diabetes, dementia presenting to the ED with chief complaint of altered mental status.  Increased somnolence today, not responding leg normal, history of dementia coming from a rehab facility.  Strong smelling urine recently.  Nonambulatory at baseline  Review of Systems  I was unable to obtain a full/accurate HPI, PMH, or ROS due to the patient's dementia.  Patient's Health History    Past Medical History:  Diagnosis Date   Anemia 10/28/2003   Dementia (HCC)    Diabetes mellitus    Hypertension     Past Surgical History:  Procedure Laterality Date   CATARACT EXTRACTION  12/06/2003   left eye   TUBAL LIGATION  1977   YAG LASER APPLICATION Right 11/09/2013   Procedure: YAG LASER APPLICATION;  Surgeon: Oneil T. Roz, MD;  Location: [facility];  Service: Ophthalmology;  Laterality: Right;    Family History  Problem Relation Age of Onset   Diabetes Mother    Hypertension Mother    Cancer Brother    Diabetes Brother    Hypertension Maternal Grandmother    Diabetes Maternal Grandmother    Cancer Brother    Diabetes Brother     Social History   Socioeconomic History   Marital status: Married    Spouse name: Not on file   Number of children: Not on file   Years of education: Not on file   Highest education level: Not on file  Occupational History   Not on file  Tobacco Use   Smoking status: Never   Smokeless tobacco: Never  Vaping Use   Vaping status: Never Used  Substance and Sexual Activity   Alcohol  use: No   Drug use: No   Sexual activity: Not Currently    Partners: Male  Other Topics Concern   Not on file  Social History Narrative   Not on file   Social Drivers of Health   Financial  Resource Strain: Not on file  Food Insecurity: Patient Unable To Answer (11/28/2022)   Hunger Vital Sign    Worried About Running Out of Food in the Last Year: Patient unable to answer    Ran Out of Food in the Last Year: Patient unable to answer  Transportation Needs: No Transportation Needs (11/28/2022)   PRAPARE - Administrator, Civil Service (Medical): No    Lack of Transportation (Non-Medical): No  Physical Activity: Not on file  Stress: Not on file  Social Connections: Not on file  Intimate Partner Violence: Patient Unable To Answer (11/28/2022)   Humiliation, Afraid, Rape, and Kick questionnaire    Fear of Current or Ex-Partner: Patient unable to answer    Emotionally Abused: Patient unable to answer    Physically Abused: Patient unable to answer    Sexually Abused: Patient unable to answer     Physical Exam   Vitals:   06/05/23 0000 06/05/23 0030  BP: 117/65 137/76  Pulse: 81 84  Resp: 16 (!) 21  Temp:    SpO2: 100% 98%    CONSTITUTIONAL: Chronically ill-appearing, NAD NEURO/PSYCH: Awake and alert, not oriented, can follow commands EYES:  eyes equal and reactive ENT/NECK:  no LAD, no JVD CARDIO: Regular rate, well-perfused, normal S1 and  S2 PULM:  CTAB no wheezing or rhonchi GI/GU:  non-distended, non-tender MSK/SPINE:  No gross deformities, no edema SKIN:  no rash, atraumatic   *Additional and/or pertinent findings included in MDM below  Diagnostic and Interventional Summary    EKG Interpretation Date/Time:    Ventricular Rate:    PR Interval:    QRS Duration:    QT Interval:    QTC Calculation:   R Axis:      Text Interpretation:         Labs Reviewed  CBC WITH DIFFERENTIAL/PLATELET - Abnormal; Notable for the following components:      Result Value   Platelets 148 (*)    All other components within normal limits  COMPREHENSIVE METABOLIC PANEL - Abnormal; Notable for the following components:   Glucose, Bld 101 (*)    BUN 42 (*)     Creatinine, Ser 1.59 (*)    GFR, Estimated 31 (*)    All other components within normal limits  URINALYSIS, ROUTINE W REFLEX MICROSCOPIC - Abnormal; Notable for the following components:   Color, Urine AMBER (*)    APPearance CLOUDY (*)    Ketones, ur 5 (*)    Protein, ur 100 (*)    Leukocytes,Ua MODERATE (*)    Bacteria, UA FEW (*)    All other components within normal limits    No orders to display    Medications  cefTRIAXone  (ROCEPHIN ) 1 g in sodium chloride  0.9 % 100 mL IVPB (has no administration in time range)  sodium chloride  0.9 % bolus 500 mL (has no administration in time range)     Procedures  /  Critical Care Procedures  ED Course and Medical Decision Making  Initial Impression and Ddx Differential diagnosis includes electrolyte disturbance, UTI, waxing and waning symptoms of dementia.  Vital signs reassuring, abdomen soft and nontender, no acute respiratory distress, moves all extremities equally, reportedly seems to be at her baseline at this time, she is awake and alert.  Past medical/surgical history that increases complexity of ED encounter: Dementia  Interpretation of Diagnostics I personally reviewed the laboratory assessment and my interpretation is as follows: Acute kidney injury is noted, urinalysis with suspicion for infection    Patient Reassessment and Ultimate Disposition/Management     Spoke with patient's son, patient can normally converse and make sense.  On my exam she was not really able to converse, was talking but mostly nonsensically.  Suspect she is still experiencing altered mental status in the setting of urinary tract infection.  Given this and the acute kidney injury, plan is for hospitalist admission.  Patient management required discussion with the following services or consulting groups:  Hospitalist Service  Complexity of Problems Addressed Acute illness or injury that poses threat of life of bodily function  Additional Data  Reviewed and Analyzed Further history obtained from: Further history from spouse/family member  Additional Factors Impacting ED Encounter Risk Consideration of hospitalization  Ozell HERO. Theadore, MD [facility] Emergency Medicine [facility] [email or web address]  Final Clinical Impressions(s) / ED Diagnoses     ICD-10-CM   1. Altered mental status, unspecified altered mental status type  R41.82     2. Acute cystitis without hematuria  N30.00     3. AKI (acute kidney injury) (HCC)  N17.9       ED Discharge Orders     None        Discharge Instructions Discussed with and Provided to Patient:   Discharge Instructions  None      Theadore Ozell HERO, MD 06/05/23 [phone]

## 2023-06-05 NOTE — Consult Note (Signed)
 Consultation Note Date: 06/05/2023   Patient Name: Lindsey Mcguire  DOB: 1934-12-08  MRN: [id number]  Age / Sex: 89 y.o., female  PCP: Elliot Charm, MD Referring Physician: Cheryle Page, MD  Reason for Consultation: Establishing goals of care  HPI/Patient Profile: 89 y.o. female  with past medical history of dementia, HTN, HLD, HFpEF, hypothyroidism, diabetes, anemia, CKD stage IV, osteoporosis  admitted on 06/04/2023 with altered mental status, heart failure with preserved EF, acute cystitis, urine culture pending.   Clinical Assessment and Goals of Care: I have reviewed medical records including EPIC notes, labs and imaging, received report from RN, assessed the patient.  Mrs. Vegh is lying quietly in bed.  She appears acutely/chronically ill and frail.  She is resting comfortably and does not open her eyes to gentle voice or touch.  She has known dementia, therefore I do not try to make her awake.  There is no family at bedside at this time.  Call to son, Adelisa, Satterwhite., to discuss diagnosis prognosis, GOC, EOL wishes, disposition and options.  No answer, unable to leave voicemail message at this time.  Per chart review Mrs. Pritt was at [facility] rehab for 5 months, discharging to her son Mike's home on Friday 1/31.  She has a wheelchair, walker, 3 and 1, shower chair.  She was to be having home health physical therapy through [facility].   HCPOA NEXT OF KIN    SUMMARY OF RECOMMENDATIONS   Continue to treat the treatable but no CPR or intubation Home with home health   Code Status/Advance Care Planning: DNR  Symptom Management:  Per hospitalist, no additional needs at this time.  Palliative Prophylaxis:  Frequent Pain Assessment, Palliative Wound Care, and Turn Reposition  Additional Recommendations (Limitations, Scope, Preferences): Continue to treatment no CPR or  intubation  Psycho-social/Spiritual:  Desire for further Chaplaincy support:no Additional Recommendations: Caregiving  Support/Resources and Education on Hospice  Prognosis:  < 6 months would be anticipated based on chronic illness burden, decreased functional status.  Discharge Planning: Anticipate home, would benefit from outpatient palliative services      Primary Diagnoses: Present on Admission:  Altered mental status  (HFpEF) heart failure with preserved ejection fraction (HCC)  Dementia (HCC)  Essential hypertension  Mixed hyperlipidemia  Acquired hypothyroidism   I have reviewed the medical record, interviewed the patient and family, and examined the patient. The following aspects are pertinent.  Past Medical History:  Diagnosis Date   Anemia 10/28/2003   Dementia (HCC)    Diabetes mellitus    Hypertension    Social History   Socioeconomic History   Marital status: Married    Spouse name: Not on file   Number of children: Not on file   Years of education: Not on file   Highest education level: Not on file  Occupational History   Not on file  Tobacco Use   Smoking status: Never   Smokeless tobacco: Never  Vaping Use   Vaping status: Never Used  Substance and Sexual Activity  Alcohol  use: No   Drug use: No   Sexual activity: Not Currently    Partners: Male  Other Topics Concern   Not on file  Social History Narrative   Not on file   Social Drivers of Health   Financial Resource Strain: Not on file  Food Insecurity: Patient Unable To Answer (11/28/2022)   Hunger Vital Sign    Worried About Running Out of Food in the Last Year: Patient unable to answer    Ran Out of Food in the Last Year: Patient unable to answer  Transportation Needs: No Transportation Needs (11/28/2022)   PRAPARE - Administrator, Civil Service (Medical): No    Lack of Transportation (Non-Medical): No  Physical Activity: Not on file  Stress: Not on file  Social  Connections: Not on file   Family History  Problem Relation Age of Onset   Diabetes Mother    Hypertension Mother    Cancer Brother    Diabetes Brother    Hypertension Maternal Grandmother    Diabetes Maternal Grandmother    Cancer Brother    Diabetes Brother    Scheduled Meds:  atorvastatin   40 mg Oral Daily   heparin   5,000 Units Subcutaneous Q8H   insulin  aspart  0-9 Units Subcutaneous TID WC   levothyroxine   88 mcg Oral Q0600   pantoprazole   40 mg Oral Daily   Continuous Infusions:  sodium chloride  50 mL/hr at 06/05/23 0538   cefTRIAXone  (ROCEPHIN )  IV     PRN Meds:.acetaminophen  **OR** acetaminophen , hydrALAZINE , ondansetron  **OR** ondansetron  (ZOFRAN ) IV Medications Prior to Admission:  Prior to Admission medications   Medication Sig Start Date End Date Taking? Authorizing Provider  busPIRone (BUSPAR) 10 MG tablet Take 10 mg by mouth 3 (three) times daily. 05/30/23  Yes [provider]  cholecalciferol (VITAMIN D3) 25 MCG (1000 UNIT) tablet Take 1,000 Units by mouth daily. 05/30/23  Yes [provider]  nystatin cream (MYCOSTATIN) Apply 1 Application topically 3 (three) times daily. 12/18/22  Yes [provider]  omeprazole  (PRILOSEC) 20 MG capsule Take 20 mg by mouth every morning. 05/30/23  Yes [provider]  senna (SENOKOT) 8.6 MG TABS tablet Take 1 tablet by mouth 2 (two) times daily. 05/30/23  Yes [provider]  atorvastatin  (LIPITOR) 40 MG tablet Take 40 mg by mouth daily.    [provider]  calcium  carbonate (OS-CAL) 600 MG TABS tablet Take 600 mg by mouth daily.    [provider]  ferrous sulfate  325 (65 FE) MG tablet Take 325 mg by mouth daily with breakfast.    [provider]  FLUoxetine (PROZAC) 20 MG capsule Take 20 mg by mouth daily. 12/15/22   [provider]  levothyroxine  (SYNTHROID ) 88 MCG tablet Take 88 mcg by mouth daily.    [provider]  Multiple  Vitamins-Minerals (CENTRUM SILVER WOMEN 50+) TABS Take 1 tablet by mouth daily.    [provider]  pantoprazole  (PROTONIX ) 40 MG tablet Take 1 tablet (40 mg total) by mouth daily. 12/18/22   Hongalgi, Anand D, MD  polyethylene glycol (MIRALAX  / GLYCOLAX ) 17 g packet Take 17 g by mouth daily as needed for moderate constipation. 12/18/22   Hongalgi, Anand D, MD  senna-docusate (SENOKOT-S) 8.6-50 MG tablet Take 1 tablet by mouth 2 (two) times daily. 12/18/22   Hongalgi, Anand D, MD   Allergies  Allergen Reactions   Amaryl  [Glimepiride ] Other (See Comments)    Upset stomach  Avandia [Rosiglitazone] Other (See Comments)    Loss of taste and sense of smell   Review of Systems  Unable to perform ROS: Dementia    Physical Exam Vitals and nursing note reviewed.    Vital Signs: BP 124/78   Pulse 73   Temp 97.6 F (36.4 C) (Axillary)   Resp 15   Ht 5' (1.524 m)   Wt 43 kg   SpO2 100%   BMI 18.51 kg/m  Pain Scale: 0-10   Pain Score: 0-No pain   SpO2: SpO2: 100 % O2 Device:SpO2: 100 % O2 Flow Rate: .   IO: Intake/output summary:  Intake/Output Summary (Last 24 hours) at 06/05/2023 0930 Last data filed at 06/05/2023 0207 Gross per 24 hour  Intake 600 ml  Output --  Net 600 ml    LBM:   Baseline Weight: Weight: 43 kg Most recent weight: Weight: 43 kg     Palliative Assessment/Data:     Time In: 0830 Time Out: 0910 Time Total: 40 minutes  Greater than 50%  of this time was spent counseling and coordinating care related to the above assessment and plan.  Signed by: Lorenza DELENA Birkenhead, NP   Please contact Palliative Medicine Team phone at [phone] for questions and concerns.  For individual provider: See Tracey

## 2023-06-05 NOTE — TOC Initial Note (Signed)
 Transition of Care [facility]) - Initial/Assessment Note    Patient Details  Name: Lindsey Mcguire MRN: [id number] Date of Birth: 11/29/1934  Transition of Care [facility]) CM/SW Contact:    Noreen KATHEE Cleotilde ISRAEL Phone Number: 06/05/2023, 2:14 PM  Clinical Narrative:                 CSW spoke with patient son's [name] and Garrel and assessed pt. [name] shared that pt lives with Garrel and the family provides pt care along with [facility] who was suppose to come out for [facility] to assist until pt was hospitalized. [name] shared that pt has a WC, walker, 3-n-1, and shower chair in the home and last Friday 1/31 pt was DC from SNF,  [facility] where she was at for 5 months. After speaking with [name] CSW called Garrel where pt resigns and shared PT recommendation for HHPT. Garrel was agreeable to CSW setting up HHPT in the through [facility]. Garrel did ask for CSW to call Larnell Larnell was also agreeable. CSW reached out to [facility] with referral and she is able to accept.  Expected Discharge Plan: Home w Home Health Services Barriers to Discharge: Continued Medical Work up   Patient Goals and CMS Choice Patient states their goals for this hospitalization and ongoing recovery are:: return back with family CMS Medicare.gov Compare Post Acute Care list provided to:: Patient Represenative (must comment) (Son's- Garrel and Larnell) Choice offered to / list presented to : Adult Children [facility] ownership interest in [facility].provided to:: Adult Children    Expected Discharge Plan and Services In-house Referral: Clinical Social Work Discharge Planning Services: CM Consult Post Acute Care Choice: Durable Medical Equipment, Home Health Living arrangements for the past 2 months: Single Family Home                           HH Arranged: PT HH Agency: [facility] Date HH Agency Contacted: 06/05/23 Time HH Agency Contacted: 1413 Representative spoke with at [facility] Agency: [place]  Prior Living  Arrangements/Services Living arrangements for the past 2 months: Single Family Home Lives with:: Adult Children Patient language and need for interpreter reviewed:: Yes Do you feel safe going back to the place where you live?: Yes      Need for Family Participation in Patient Care: Yes (Comment) Care giver support system in place?: Yes (comment) Current home services: DME, Home PT, Other (comment) (CNA) Criminal Activity/Legal Involvement Pertinent to Current Situation/Hospitalization: No - Comment as needed  Activities of Daily Living   ADL Screening (condition at time of admission) Independently performs ADLs?: No Does the patient have a NEW difficulty with bathing/dressing/toileting/self-feeding that is expected to last >3 days?: Yes (Initiates electronic notice to provider for possible OT consult) Does the patient have a NEW difficulty with getting in/out of bed, walking, or climbing stairs that is expected to last >3 days?: Yes (Initiates electronic notice to provider for possible PT consult) Does the patient have a NEW difficulty with communication that is expected to last >3 days?: Yes (Initiates electronic notice to provider for possible SLP consult) Is the patient deaf or have difficulty hearing?: No Does the patient have difficulty seeing, even when wearing glasses/contacts?: No Does the patient have difficulty concentrating, remembering, or making decisions?: Yes  Permission Sought/Granted      Share Information with NAME: Garrel and Larnell     Permission granted to share info w Relationship: Son's  Emotional Assessment Appearance:: Appears stated age     Orientation: :  (Disorient x 4) Alcohol  / Substance Use: Not Applicable Psych Involvement: No (comment)  Admission diagnosis:  Altered mental status [R41.82] Acute cystitis without hematuria [N30.00] AKI (acute kidney injury) (HCC) [N17.9] Altered mental status, unspecified altered mental status type  [R41.82] Patient Active Problem List   Diagnosis Date Noted   Altered mental status 06/05/2023   Acute cystitis 06/05/2023   Acute kidney injury superimposed on chronic kidney disease (HCC) 06/05/2023   Dehydration 06/05/2023   Thrombocytopenia (HCC) 06/05/2023   Stercoral colitis 11/28/2022   Hypotension 11/28/2022   (HFpEF) heart failure with preserved ejection fraction (HCC) 11/28/2022   Acquired hypothyroidism 11/28/2022   Mixed hyperlipidemia 11/28/2022   Fecal impaction (HCC) 11/28/2022   Chronic constipation 11/28/2022   Chronic kidney disease (CKD), stage IV (severe) (HCC) 11/28/2022   Hyperglycemia 11/28/2022   UTI (urinary tract infection) 11/28/2022   Failure to thrive in adult 11/28/2022   Essential hypertension    Dementia (HCC)    Acute CHF (congestive heart failure) (HCC) 02/07/2019   CHF (congestive heart failure) (HCC) 02/06/2019   ICH (intracerebral hemorrhage) (HCC) 02/23/2017   Intracranial hemorrhage (HCC) 02/23/2017   Type 2 diabetes mellitus with stage 4 chronic kidney disease, without long-term current use of insulin  (HCC) 09/24/2016   Anemia due to chronic renal failure treated with erythropoietin 02/16/2011   PCP:  Elliot Charm, MD Pharmacy:   [facility] [phone] - [place], [place] - [address] AT [facility] [address] [place] [address] Phone: [phone] Fax: [phone]     Social Drivers of Health (SDOH) Social History: SDOH Screenings   Food Insecurity: Patient Unable To Answer (06/05/2023)  Housing: Patient Unable To Answer (06/05/2023)  Transportation Needs: No Transportation Needs (06/05/2023)  Utilities: Patient Unable To Answer (06/05/2023)  Social Connections: Patient Unable To Answer (06/05/2023)  Tobacco Use: Low Risk  (06/05/2023)  Health Literacy: High Risk (10/21/2022)   Received from [facility]   SDOH Interventions:     Readmission Risk Interventions    06/05/2023    2:09 PM  06/05/2023    9:31 AM  Readmission Risk Prevention Plan  Transportation Screening Complete Complete  Home Care Screening Complete Complete  Medication Review (RN CM) Complete Complete

## 2023-06-05 NOTE — Plan of Care (Signed)
  Problem: Acute Rehab PT Goals(only PT should resolve) Goal: Pt Will Go Supine/Side To Sit Outcome: Progressing Flowsheets (Taken 06/05/2023 1358) Pt will go Supine/Side to Sit: with moderate assist Goal: Patient Will Transfer Sit To/From Stand Outcome: Progressing Flowsheets (Taken 06/05/2023 1358) Patient will transfer sit to/from stand: with maximum assist Goal: Pt Will Transfer Bed To Chair/Chair To Bed Outcome: Progressing Flowsheets (Taken 06/05/2023 1358) Pt will Transfer Bed to Chair/Chair to Bed: with max assist Goal: Pt Will Ambulate Outcome: Progressing Flowsheets (Taken 06/05/2023 1358) Pt will Ambulate:  10 feet  with rolling walker  with maximum assist    Jakobie Henslee SPT

## 2023-06-06 DIAGNOSIS — R4182 Altered mental status, unspecified: Secondary | ICD-10-CM | POA: Diagnosis not present

## 2023-06-06 LAB — COMPREHENSIVE METABOLIC PANEL
ALT: 27 U/L (ref 0–44)
AST: 26 U/L (ref 15–41)
Albumin: 3.1 g/dL — ABNORMAL LOW (ref 3.5–5.0)
Alkaline Phosphatase: 84 U/L (ref 38–126)
Anion gap: 6 (ref 5–15)
BUN: 39 mg/dL — ABNORMAL HIGH (ref 8–23)
CO2: 23 mmol/L (ref 22–32)
Calcium: 9 mg/dL (ref 8.9–10.3)
Chloride: 109 mmol/L (ref 98–111)
Creatinine, Ser: 1.36 mg/dL — ABNORMAL HIGH (ref 0.44–1.00)
GFR, Estimated: 38 mL/min — ABNORMAL LOW (ref >60)
Glucose, Bld: 96 mg/dL (ref 70–99)
Potassium: 4.4 mmol/L (ref 3.5–5.1)
Sodium: 138 mmol/L (ref 135–145)
Total Bilirubin: 0.4 mg/dL (ref 0.0–1.2)
Total Protein: 6.4 g/dL — ABNORMAL LOW (ref 6.5–8.1)

## 2023-06-06 LAB — CBC WITH DIFFERENTIAL/PLATELET
Abs Immature Granulocytes: 0.01 10*3/uL (ref 0.00–0.07)
Basophils Absolute: 0 10*3/uL (ref 0.0–0.1)
Basophils Relative: 0 %
Eosinophils Absolute: 0 10*3/uL (ref 0.0–0.5)
Eosinophils Relative: 0 %
HCT: 34.9 % — ABNORMAL LOW (ref 36.0–46.0)
Hemoglobin: 11.1 g/dL — ABNORMAL LOW (ref 12.0–15.0)
Immature Granulocytes: 0 %
Lymphocytes Relative: 14 %
Lymphs Abs: 0.7 10*3/uL (ref 0.7–4.0)
MCH: 26.9 pg (ref 26.0–34.0)
MCHC: 31.8 g/dL (ref 30.0–36.0)
MCV: 84.5 fL (ref 80.0–100.0)
Monocytes Absolute: 0.3 10*3/uL (ref 0.1–1.0)
Monocytes Relative: 5 %
Neutro Abs: 4 10*3/uL (ref 1.7–7.7)
Neutrophils Relative %: 81 %
Platelets: 137 10*3/uL — ABNORMAL LOW (ref 150–400)
RBC: 4.13 MIL/uL (ref 3.87–5.11)
RDW: 13.8 % (ref 11.5–15.5)
WBC: 5 10*3/uL (ref 4.0–10.5)
nRBC: 0 % (ref 0.0–0.2)

## 2023-06-06 LAB — MAGNESIUM: Magnesium: 1.9 mg/dL (ref 1.7–2.4)

## 2023-06-06 LAB — GLUCOSE, CAPILLARY
Glucose-Capillary: 90 mg/dL (ref 70–99)
Glucose-Capillary: 85 mg/dL (ref 70–99)
Glucose-Capillary: 87 mg/dL (ref 70–99)
Glucose-Capillary: 97 mg/dL (ref 70–99)

## 2023-06-06 MED ORDER — SODIUM CHLORIDE 0.9 % IV SOLN: INTRAVENOUS | Status: DC

## 2023-06-06 MED ORDER — MAGNESIUM SULFATE 2 GM/50ML IV SOLN
2.0000 g | Freq: Once | INTRAVENOUS | Status: AC
  Administered 2023-06-06: 2 g via INTRAVENOUS
  Filled 2023-06-06: qty 50

## 2023-06-06 NOTE — Progress Notes (Signed)
 PROGRESS NOTE    Lindsey Mcguire  FMW:[id number] DOB: [birth year] DOA: 06/04/2023 PCP: Elliot Charm, MD   Brief Narrative:  89 y.o. female with medical history significant of hypertension, hyperlipidemia, hypothyroidism, dementia, HFpEF, type 2 diabetes mellitus, anemia, stage IV CKD, osteoporosis presented with altered mental status.  On presentation, creatinine was 1.59 (baseline of 1-1.2) with UA suggestive of UTI.  She was started on IV fluids and antibiotics.  Assessment & Plan:   UTI/presumed acute cystitis: Present on admission -Continue Rocephin .  Follow urine cultures: Negative so far  Acute metabolic encephalopathy Dementia Failure to thrive -Patient has history of dementia and presented with worsening mental status: Most likely secondary to UTI -Monitor mental status.  Fall precaution.  PT eval. -Delirium precautions -Palliative care consulted for goals of care discussion.  AKI superimposed on CKD stage IIIb -Baseline creatinine of 1.0-1.2.  Presented with creatinine of 1.59.  Treated with gentle hydration.  Creatinine 1.36 this morning.  Resume gentle hydration.  Oral intake is currently poor.  Chronic thrombocytopenia -Questionable cause.  No signs of bleeding.  Monitor intermittently  Chronic diastolic heart failure -Show no signs of volume overload.  Strict input and output.  Daily weights.  Outpatient follow-up with cardiology  Hyperlipidemia -Continue statin  Hypothyroidism -Continue levothyroxine   GERD -Continue Protonix   Diabetes mellitus type 2 -A1c 6.8.  Continue CBGs with SSI  DVT prophylaxis: Subcutaneous heparin  Code Status: DNR Family Communication: None at bedside Disposition Plan: Status is: Inpatient Remains inpatient appropriate because: Of severity of illness  Consultants: Palliative care  Procedures: None  Antimicrobials:  Anti-infectives (From admission, onward)    Start     Dose/Rate Route Frequency Ordered Stop    06/05/23 1000  cefTRIAXone  (ROCEPHIN ) 1 g in sodium chloride  0.9 % 100 mL IVPB        1 g 200 mL/hr over 30 Minutes Intravenous Every 24 hours 06/05/23 0517     06/05/23 0115  cefTRIAXone  (ROCEPHIN ) 1 g in sodium chloride  0.9 % 100 mL IVPB        1 g 200 mL/hr over 30 Minutes Intravenous  Once 06/05/23 0107 06/05/23 0207        Subjective: Patient seen and examined at bedside.  Confused, poor historian.  No seizures, fever, vomiting, agitation reported.  Objective: Vitals:   06/05/23 1118 06/05/23 1650 06/05/23 2151 06/06/23 0500  BP: (!) 143/62 134/70 122/65 98/70  Pulse: 69 64 67 81  Resp: 16 16 16 16   Temp: (!) 97.4 F (36.3 C) (!) 97.5 F (36.4 C) 97.6 F (36.4 C) 98.7 F (37.1 C)  TempSrc: Axillary Oral Axillary Axillary  SpO2: 97% 100% 100% 99%  Weight: 43.3 kg     Height: 5' (1.524 m)       Intake/Output Summary (Last 24 hours) at 06/06/2023 0639 Last data filed at 06/06/2023 0300 Gross per 24 hour  Intake 659.65 ml  Output --  Net 659.65 ml   Filed Weights   06/04/23 2028 06/05/23 1118  Weight: 43 kg 43.3 kg    Examination:  General exam: Appears calm and comfortable.  On room air.  Chronically ill and deconditioned. Respiratory system: Bilateral decreased breath sounds at bases Cardiovascular system: S1 & S2 heard, Rate controlled Gastrointestinal system: Abdomen is nondistended, soft and nontender. Normal bowel sounds heard. Extremities: No cyanosis, clubbing, edema  Central nervous system: Wakes up slightly, confused.  Slow to respond.  Poor historian.  No focal neurological deficits. Moving extremities Skin: No rashes, lesions or ulcers Psychiatry:  Flat affect.  Not agitated.  Data Reviewed: I have personally reviewed following labs and imaging studies  CBC: Recent Labs  Lab 06/04/23 2201 06/05/23 0603 06/06/23 0546  WBC 5.8 7.0 5.0  NEUTROABS 4.5  --  4.0  HGB 12.5 12.1 11.1*  HCT 39.2 37.8 34.9*  MCV 86.0 86.7 84.5  PLT 148* 145* 137*    Basic Metabolic Panel: Recent Labs  Lab 06/04/23 2201 06/05/23 0652 06/06/23 0546  NA 139 138 138  K 4.5 4.2 4.4  CL 106 107 109  CO2 24 23 23   GLUCOSE 101* 87 96  BUN 42* 39* 39*  CREATININE 1.59* 1.35* 1.36*  CALCIUM  9.6 9.0 9.0  MG  --  2.0 1.9  PHOS  --  2.5  --    GFR: Estimated Creatinine Clearance: 20.3 mL/min (A) (by C-G formula based on SCr of 1.36 mg/dL (H)). Liver Function Tests: Recent Labs  Lab 06/04/23 2201 06/05/23 0652 06/06/23 0546  AST 41 31 26  ALT 39 35 27  ALKPHOS 93 91 84  BILITOT 0.5 0.6 0.4  PROT 7.2 7.0 6.4*  ALBUMIN  3.5 3.3* 3.1*   No results for input(s): LIPASE, AMYLASE in the last 168 hours. No results for input(s): AMMONIA in the last 168 hours. Coagulation Profile: No results for input(s): INR, PROTIME in the last 168 hours. Cardiac Enzymes: No results for input(s): CKTOTAL, CKMB, CKMBINDEX, TROPONINI in the last 168 hours. BNP (last 3 results) No results for input(s): PROBNP in the last 8760 hours. HbA1C: Recent Labs    06/05/23 0603  HGBA1C 6.8*   CBG: Recent Labs  Lab 06/05/23 1135 06/05/23 1655 06/05/23 2306  GLUCAP 90 73 89   Lipid Profile: No results for input(s): CHOL, HDL, LDLCALC, TRIG, CHOLHDL, LDLDIRECT in the last 72 hours. Thyroid  Function Tests: No results for input(s): TSH, T4TOTAL, FREET4, T3FREE, THYROIDAB in the last 72 hours. Anemia Panel: No results for input(s): VITAMINB12, FOLATE, FERRITIN, TIBC, IRON, RETICCTPCT in the last 72 hours. Sepsis Labs: No results for input(s): PROCALCITON, LATICACIDVEN in the last 168 hours.  No results found for this or any previous visit (from the past 240 hours).       Radiology Studies: No results found.      Scheduled Meds:  atorvastatin   40 mg Oral Daily   feeding supplement  237 mL Oral BID BM   heparin   5,000 Units Subcutaneous Q8H   insulin  aspart  0-9 Units Subcutaneous TID WC    levothyroxine   88 mcg Oral Q0600   mouth rinse  15 mL Mouth Rinse 4 times per day   pantoprazole   40 mg Oral Daily   Continuous Infusions:  cefTRIAXone  (ROCEPHIN )  IV Stopped (06/05/23 1203)          Sophie Mao, MD [facility] 06/06/2023, 6:39 AM

## 2023-06-06 NOTE — Progress Notes (Signed)
 Pt remained somnolent over night. She occasionally opened her eyes when stimulated but only moaned and groaned when repositioned. Safety mitts were removed. This RN attempted to give her a sip of ensure and she was able to take a very small sip. She required suction several times overnight due to excess secretions. Synthroid  held this AM due to patient so sleepy. Kellogg RN

## 2023-06-06 NOTE — Plan of Care (Signed)
  Problem: Metabolic: Goal: Ability to maintain appropriate glucose levels will improve Outcome: Progressing   Problem: Tissue Perfusion: Goal: Adequacy of tissue perfusion will improve Outcome: Progressing   Problem: Clinical Measurements: Goal: Ability to maintain clinical measurements within normal limits will improve Outcome: Progressing   Problem: Nutritional: Goal: Maintenance of adequate nutrition will improve Outcome: Not Progressing

## 2023-06-06 NOTE — Plan of Care (Signed)
  Problem: Coping: Goal: Ability to adjust to condition or change in health will improve Outcome: Not Progressing   Problem: Fluid Volume: Goal: Ability to maintain a balanced intake and output will improve Outcome: Not Progressing   Problem: Health Behavior/Discharge Planning: Goal: Ability to manage health-related needs will improve Outcome: Not Progressing

## 2023-06-06 NOTE — Progress Notes (Signed)
 SLP Cancellation Note  Patient Details Name: Lindsey Mcguire MRN: [id number] DOB: 26-Jan-1935   Cancelled treatment:       Reason Eval/Treat Not Completed: Fatigue/lethargy limiting ability to participate. Unable to rouse Pt to appropriate level for PO  trials. Please note Pt should not be provided PO unless she is alert, responsive and attending to what is being placed in oral cavity. ST will continue efforts, thank you.  Avani Sensabaugh H. Clois KILLIAN, CCC-SLP Speech Language Pathologist    Raguel VEAR Clois 06/06/2023, 7:02 AM

## 2023-06-06 NOTE — Plan of Care (Signed)
  Problem: Acute Rehab OT Goals (only OT should resolve) Goal: Pt. Will Perform Eating Flowsheets (Taken 06/06/2023 1019) Pt Will Perform Eating:  with set-up  with min assist Goal: Pt. Will Perform Grooming Flowsheets (Taken 06/06/2023 1019) Pt Will Perform Grooming:  with set-up  with min assist Goal: Pt. Will Transfer To Toilet Flowsheets (Taken 06/06/2023 1019) Pt Will Transfer to Toilet:  with mod assist  bedside commode  Chiquita Sermon, OTR/L

## 2023-06-06 NOTE — Evaluation (Signed)
 Occupational Therapy Evaluation Patient Details Name: Lindsey Mcguire MRN: [id number] DOB: June 14, 1934 Today's Date: 06/06/2023   History of Present Illness Lindsey Mcguire is an 89 y.o. female with medical history significant of hypertension, hyperlipidemia, hypothyroidism, dementia, HFpEF, type 2 diabetes mellitus, anemia, stage IV CKD, osteoporosis who presents to the emergency department via EMS from rehab facility due to altered mental status.  She was unable to provide a history possibly due to altered mental status, history was obtained from ED physician and ED medical record.  Per report, patient was noted with increased somnolence today, she was not responding normally per baseline of functioning whereby she was able to converse ( though waxes and wanes).  Urine smell was reported to have been stronger recently.  Patient is nonambulatory at baseline.   Clinical Impression   Pt sleeping upon OT arrival. OT attempting to wake up pt but unable, she did not open eyes or follow commands. Completed bed mobility sitting up in bed from supine with max assist.  Per discussion with PT, she will discharge home to family with full time assist. PT would benefit from continued OT services in acute care setting.       If plan is discharge home, recommend the following: A lot of help with walking and/or transfers;A lot of help with bathing/dressing/bathroom;Assistance with cooking/housework;Assistance with feeding;Direct supervision/assist for medications management;Assist for transportation;Help with stairs or ramp for entrance;Supervision due to cognitive status    Functional Status Assessment  Patient has had a recent decline in their functional status and demonstrates the ability to make significant improvements in function in a reasonable and predictable amount of time.  Equipment Recommendations  None recommended by OT       Precautions / Restrictions Precautions Precautions:  Fall Restrictions Weight Bearing Restrictions Per Provider Order: No      Mobility Bed Mobility Overal bed mobility: Needs Assistance Bed Mobility: Supine to Sit     Supine to sit: Max assist          Transfers                          Balance Overall balance assessment: Needs assistance Sitting-balance support: Bilateral upper extremity supported, Feet supported Sitting balance-Leahy Scale: Poor   Postural control: Posterior lean                                 ADL either performed or assessed with clinical judgement   ADL Overall ADL's : Needs assistance/impaired Eating/Feeding: Moderate assistance   Grooming: Moderate assistance   Upper Body Bathing: Maximal assistance   Lower Body Bathing: Moderate assistance   Upper Body Dressing : Moderate assistance   Lower Body Dressing: Moderate assistance   Toilet Transfer: Maximal assistance   Toileting- Clothing Manipulation and Hygiene: Maximal assistance   Tub/ Shower Transfer: Maximal assistance   Functional mobility during ADLs: Maximal assistance General ADL Comments: pt assisted by family at baseline     Vision Baseline Vision/History: 0 No visual deficits Ability to See in Adequate Light: 0 Adequate              Pertinent Vitals/Pain Pain Assessment Pain Assessment: Faces Faces Pain Scale: Hurts a little bit     Extremity/Trunk Assessment Upper Extremity Assessment Upper Extremity Assessment: Generalized weakness   Lower Extremity Assessment Lower Extremity Assessment: Defer to PT evaluation   Cervical / Trunk Assessment Cervical /  Trunk Assessment: Normal   Communication Communication Communication: Difficulty following commands/understanding;Difficulty communicating thoughts/reduced clarity of speech   Cognition Arousal: Lethargic Behavior During Therapy: Flat affect Overall Cognitive Status: History of cognitive impairments - at baseline                                  General Comments: Pt does not follow commands or open eyes                Home Living Family/patient expects to be discharged to:: Private residence Living Arrangements: Children Available Help at Discharge: Family Type of Home: Mobile home Home Access: Stairs to enter Secretary/administrator of Steps: 6 Entrance Stairs-Rails: Right;Left;Can reach both Home Layout: One level     Bathroom Shower/Tub: Walk-in shower;Sponge bathes at baseline   [employer]: Standard Bathroom Accessibility: Yes How Accessible: Accessible via walker Home Equipment: Rolling Walker (2 wheels);Cane - single point   Additional Comments: Home set up and equipment taken from pt previous hospitalization, information gathered from son and nephew      Prior Functioning/Environment Prior Level of Function : Needs assist  Cognitive Assist : ADLs (cognitive)   ADLs (Cognitive): Step by step cues Physical Assist : ADLs (physical)   ADLs (physical): Bathing;Dressing Mobility Comments: son and nephew help patient ADLs Comments: son and nephew help patient        OT Problem List: Decreased strength;Decreased activity tolerance;Impaired balance (sitting and/or standing);Decreased safety awareness;Decreased cognition      OT Treatment/Interventions: Self-care/ADL training;Therapeutic activities;Patient/family education;Therapeutic exercise    OT Goals(Current goals can be found in the care plan section) Acute Rehab OT Goals Patient Stated Goal: to go home- pt will have 24/7 care OT Goal Formulation: Patient unable to participate in goal setting Time For Goal Achievement: 06/20/23 Potential to Achieve Goals: Good ADL Goals Pt Will Perform Eating: with set-up;with min assist Pt Will Perform Grooming: with set-up;with min assist Pt Will Transfer to Toilet: with mod assist;bedside commode  OT Frequency: Min 2X/week       AM-PAC OT 6 Clicks Daily Activity     Outcome  Measure Help from another person eating meals?: A Lot Help from another person taking care of personal grooming?: A Lot Help from another person toileting, which includes using toliet, bedpan, or urinal?: Total Help from another person bathing (including washing, rinsing, drying)?: A Lot Help from another person to put on and taking off regular upper body clothing?: A Lot Help from another person to put on and taking off regular lower body clothing?: A Lot 6 Click Score: 11   End of Session Nurse Communication: Mobility status  Activity Tolerance: Patient limited by lethargy Patient left: in bed;with call bell/phone within reach;with bed alarm set  OT Visit Diagnosis: Muscle weakness (generalized) (M62.81);Unsteadiness on feet (R26.81)                Time: 9164-9151 OT Time Calculation (min): 13 min Charges:  OT General Charges $OT Visit: 1 Visit OT Evaluation $OT Eval Low Complexity: 1 Low   Chiquita LOISE Sermon, OTR/L  06/06/2023, 10:23 AM

## 2023-06-07 DIAGNOSIS — R4182 Altered mental status, unspecified: Secondary | ICD-10-CM | POA: Diagnosis not present

## 2023-06-07 LAB — GLUCOSE, CAPILLARY
Glucose-Capillary: 81 mg/dL (ref 70–99)
Glucose-Capillary: 130 mg/dL — ABNORMAL HIGH (ref 70–99)

## 2023-06-07 LAB — BASIC METABOLIC PANEL
Anion gap: 9 (ref 5–15)
BUN: 37 mg/dL — ABNORMAL HIGH (ref 8–23)
CO2: 20 mmol/L — ABNORMAL LOW (ref 22–32)
Calcium: 8.7 mg/dL — ABNORMAL LOW (ref 8.9–10.3)
Chloride: 112 mmol/L — ABNORMAL HIGH (ref 98–111)
Creatinine, Ser: 1.44 mg/dL — ABNORMAL HIGH (ref 0.44–1.00)
GFR, Estimated: 35 mL/min — ABNORMAL LOW (ref >60)
Glucose, Bld: 77 mg/dL (ref 70–99)
Potassium: 3.9 mmol/L (ref 3.5–5.1)
Sodium: 141 mmol/L (ref 135–145)

## 2023-06-07 LAB — URINE CULTURE

## 2023-06-07 LAB — MAGNESIUM: Magnesium: 2.3 mg/dL (ref 1.7–2.4)

## 2023-06-07 MED ORDER — FOLIC ACID 1 MG PO TABS
1.0000 mg | ORAL_TABLET | Freq: Every day | ORAL | Status: DC
  Administered 2023-06-07 – 2023-06-08 (×2): 1 mg via ORAL
  Filled 2023-06-07 (×2): qty 1

## 2023-06-07 NOTE — Plan of Care (Signed)

## 2023-06-07 NOTE — Progress Notes (Signed)
 PROGRESS NOTE    Lindsey Mcguire  FMW:[id number] DOB: 12-19-1934 DOA: 06/04/2023 PCP: Elliot Charm, MD   Brief Narrative:  89 y.o. female with medical history significant of hypertension, hyperlipidemia, hypothyroidism, dementia, HFpEF, type 2 diabetes mellitus, anemia, stage IV CKD, osteoporosis presented with altered mental status.  On presentation, creatinine was 1.59 (baseline of 1-1.2) with UA suggestive of UTI.  She was started on IV fluids and antibiotics.  Assessment & Plan:   UTI/presumed acute cystitis: Present on admission -Continue Rocephin .  Follow urine cultures: Negative so far  Acute metabolic encephalopathy Dementia Failure to thrive -Patient has history of dementia and presented with worsening mental status: Most likely secondary to UTI -Monitor mental status.  Fall precaution.  PT recommended home health PT -Delirium precautions -Palliative care consulted for goals of care discussion.  AKI superimposed on CKD stage IIIb Acute metabolic acidosis -Baseline creatinine of 1.0-1.2.  Presented with creatinine of 1.59. Creatinine 1.44 this morning.  Currently on gentle hydration.  Monitor.  Oral intake is currently poor.  Chronic thrombocytopenia -Questionable cause.  No signs of bleeding.  Monitor intermittently  Chronic diastolic heart failure -Show no signs of volume overload.  Strict input and output.  Daily weights.  Outpatient follow-up with cardiology  Hyperlipidemia -Continue statin  Hypothyroidism -Continue levothyroxine   GERD -Continue Protonix   Diabetes mellitus type 2 -A1c 6.8.  Continue CBGs with SSI  DVT prophylaxis: Subcutaneous heparin  Code Status: DNR Family Communication: None at bedside Disposition Plan: Status is: Inpatient Remains inpatient appropriate because: Of severity of illness  Consultants: Palliative care  Procedures: None  Antimicrobials:  Anti-infectives (From admission, onward)    Start     Dose/Rate  Route Frequency Ordered Stop   06/05/23 1000  cefTRIAXone  (ROCEPHIN ) 1 g in sodium chloride  0.9 % 100 mL IVPB        1 g 200 mL/hr over 30 Minutes Intravenous Every 24 hours 06/05/23 0517     06/05/23 0115  cefTRIAXone  (ROCEPHIN ) 1 g in sodium chloride  0.9 % 100 mL IVPB        1 g 200 mL/hr over 30 Minutes Intravenous  Once 06/05/23 0107 06/05/23 0207        Subjective: Patient seen and examined at bedside.  No agitation, fever, vomiting reported.  Remains confused. Objective: Vitals:   06/06/23 0500 06/06/23 1233 06/06/23 2127 06/07/23 0430  BP: 98/70 117/67 134/71 121/81  Pulse: 81 79 80 81  Resp: 16 15 16 16   Temp: 98.7 F (37.1 C) 98.2 F (36.8 C) 98.2 F (36.8 C) 98.2 F (36.8 C)  TempSrc: Axillary Axillary Oral Oral  SpO2: 99% 100% 100% 100%  Weight:      Height:        Intake/Output Summary (Last 24 hours) at 06/07/2023 0727 Last data filed at 06/07/2023 0600 Gross per 24 hour  Intake 1026.13 ml  Output 350 ml  Net 676.13 ml   Filed Weights   06/04/23 2028 06/05/23 1118  Weight: 43 kg 43.3 kg    Examination:  General: Currently on room air.  No distress.  Chronically ill and deconditioned looking. ENT/neck: No thyromegaly.  JVD is not elevated  respiratory: Decreased breath sounds at bases bilaterally with some crackles; no wheezing CVS: S1-S2 heard, rate controlled currently Abdominal: Soft, nontender, slightly distended; no organomegaly, bowel sounds are heard Extremities: Trace lower extremity edema; no cyanosis  CNS: Awake, remains confused.  Extremely poor historian.  No obvious focal deficits noted  lymph: No obvious lymphadenopathy Skin: No obvious  ecchymosis/lesions  psych: Extremely flat affect.  Currently not agitated musculoskeletal: No obvious joint swelling/deformity   Data Reviewed: I have personally reviewed following labs and imaging studies  CBC: Recent Labs  Lab 06/04/23 2201 06/05/23 0603 06/06/23 0546  WBC 5.8 7.0 5.0  NEUTROABS  4.5  --  4.0  HGB 12.5 12.1 11.1*  HCT 39.2 37.8 34.9*  MCV 86.0 86.7 84.5  PLT 148* 145* 137*   Basic Metabolic Panel: Recent Labs  Lab 06/04/23 2201 06/05/23 0652 06/06/23 0546 06/07/23 0359  NA 139 138 138 141  K 4.5 4.2 4.4 3.9  CL 106 107 109 112*  CO2 24 23 23  20*  GLUCOSE 101* 87 96 77  BUN 42* 39* 39* 37*  CREATININE 1.59* 1.35* 1.36* 1.44*  CALCIUM  9.6 9.0 9.0 8.7*  MG  --  2.0 1.9 2.3  PHOS  --  2.5  --   --    GFR: Estimated Creatinine Clearance: 19.2 mL/min (A) (by C-G formula based on SCr of 1.44 mg/dL (H)). Liver Function Tests: Recent Labs  Lab 06/04/23 2201 06/05/23 0652 06/06/23 0546  AST 41 31 26  ALT 39 35 27  ALKPHOS 93 91 84  BILITOT 0.5 0.6 0.4  PROT 7.2 7.0 6.4*  ALBUMIN  3.5 3.3* 3.1*   No results for input(s): LIPASE, AMYLASE in the last 168 hours. No results for input(s): AMMONIA in the last 168 hours. Coagulation Profile: No results for input(s): INR, PROTIME in the last 168 hours. Cardiac Enzymes: No results for input(s): CKTOTAL, CKMB, CKMBINDEX, TROPONINI in the last 168 hours. BNP (last 3 results) No results for input(s): PROBNP in the last 8760 hours. HbA1C: Recent Labs    06/05/23 0603  HGBA1C 6.8*   CBG: Recent Labs  Lab 06/05/23 2306 06/06/23 0732 06/06/23 1151 06/06/23 1636 06/06/23 2125  GLUCAP 89 90 85 87 97   Lipid Profile: No results for input(s): CHOL, HDL, LDLCALC, TRIG, CHOLHDL, LDLDIRECT in the last 72 hours. Thyroid  Function Tests: No results for input(s): TSH, T4TOTAL, FREET4, T3FREE, THYROIDAB in the last 72 hours. Anemia Panel: No results for input(s): VITAMINB12, FOLATE, FERRITIN, TIBC, IRON, RETICCTPCT in the last 72 hours. Sepsis Labs: No results for input(s): PROCALCITON, LATICACIDVEN in the last 168 hours.  Recent Results (from the past 240 hours)  Urine Culture     Status: None (Preliminary result)   Collection Time: 06/04/23 11:49 PM    Specimen: In/Out Cath Urine  Result Value Ref Range Status   Specimen Description   Final    IN/OUT CATH URINE Performed at [facility], [address], [place], [place] [address]    Special Requests   Final    NONE Performed at [facility], [address], [place], [place] [address]    Culture   Final    CULTURE REINCUBATED FOR BETTER GROWTH Performed at [facility], [address], [place], [place] [address]    Report Status PENDING  Incomplete         Radiology Studies: No results found.      Scheduled Meds:  atorvastatin   40 mg Oral Daily   feeding supplement  237 mL Oral BID BM   heparin   5,000 Units Subcutaneous Q8H   insulin  aspart  0-9 Units Subcutaneous TID WC   levothyroxine   88 mcg Oral Q0600   mouth rinse  15 mL Mouth Rinse 4 times per day   pantoprazole   40 mg Oral Daily   Continuous Infusions:  sodium chloride  50  mL/hr at 06/07/23 0411   cefTRIAXone  (ROCEPHIN )  IV 1 g (06/06/23 1004)          Sophie Mao, MD [facility] 06/07/2023, 7:27 AM

## 2023-06-07 NOTE — Plan of Care (Signed)
  Problem: Metabolic: Goal: Ability to maintain appropriate glucose levels will improve Outcome: Progressing   Problem: Nutritional: Goal: Maintenance of adequate nutrition will improve Outcome: Progressing   Problem: Coping: Goal: Level of anxiety will decrease Outcome: Progressing   Problem: Elimination: Goal: Will not experience complications related to urinary retention Outcome: Progressing

## 2023-06-08 DIAGNOSIS — R400 Somnolence: Secondary | ICD-10-CM

## 2023-06-08 DIAGNOSIS — E039 Hypothyroidism, unspecified: Secondary | ICD-10-CM | POA: Diagnosis not present

## 2023-06-08 DIAGNOSIS — N3000 Acute cystitis without hematuria: Secondary | ICD-10-CM | POA: Diagnosis not present

## 2023-06-08 DIAGNOSIS — I5030 Unspecified diastolic (congestive) heart failure: Secondary | ICD-10-CM | POA: Diagnosis not present

## 2023-06-08 DIAGNOSIS — D696 Thrombocytopenia, unspecified: Secondary | ICD-10-CM | POA: Diagnosis not present

## 2023-06-08 LAB — BASIC METABOLIC PANEL
Anion gap: 9 (ref 5–15)
BUN: 27 mg/dL — ABNORMAL HIGH (ref 8–23)
CO2: 19 mmol/L — ABNORMAL LOW (ref 22–32)
Calcium: 8.6 mg/dL — ABNORMAL LOW (ref 8.9–10.3)
Chloride: 113 mmol/L — ABNORMAL HIGH (ref 98–111)
Creatinine, Ser: 1.19 mg/dL — ABNORMAL HIGH (ref 0.44–1.00)
GFR, Estimated: 45 mL/min — ABNORMAL LOW (ref >60)
Glucose, Bld: 107 mg/dL — ABNORMAL HIGH (ref 70–99)
Potassium: 4.2 mmol/L (ref 3.5–5.1)
Sodium: 141 mmol/L (ref 135–145)

## 2023-06-08 LAB — AMMONIA: Ammonia: 36 umol/L — ABNORMAL HIGH (ref 9–35)

## 2023-06-08 LAB — GLUCOSE, CAPILLARY: Glucose-Capillary: 97 mg/dL (ref 70–99)

## 2023-06-08 LAB — MAGNESIUM: Magnesium: 2.1 mg/dL (ref 1.7–2.4)

## 2023-06-08 LAB — VITAMIN B12: Vitamin B-12: 331 pg/mL (ref 180–914)

## 2023-06-08 LAB — TSH: TSH: 1.653 u[IU]/mL (ref 0.350–4.500)

## 2023-06-08 MED ORDER — LEVOTHYROXINE SODIUM 88 MCG PO TABS: 88.0000 ug | ORAL_TABLET | Freq: Every day | ORAL | 3 refills | Status: AC

## 2023-06-08 MED ORDER — ACETAMINOPHEN 325 MG PO TABS: 650.0000 mg | ORAL_TABLET | Freq: Four times a day (QID) | ORAL | Status: AC | PRN

## 2023-06-08 MED ORDER — FERROUS SULFATE 325 (65 FE) MG PO TABS: 325.0000 mg | ORAL_TABLET | Freq: Every day | ORAL | 3 refills | Status: AC

## 2023-06-08 MED ORDER — AMOXICILLIN 500 MG PO TABS: 500.0000 mg | ORAL_TABLET | Freq: Two times a day (BID) | ORAL | 0 refills | Status: AC

## 2023-06-08 MED ORDER — OMEPRAZOLE 20 MG PO CPDR: 20.0000 mg | DELAYED_RELEASE_CAPSULE | Freq: Every morning | ORAL | 3 refills | Status: AC

## 2023-06-08 NOTE — Progress Notes (Signed)
 Pt awake most of night. She took 2 sips of water and her synthroid  in apple sauce this AM. NO acute events overnight.Kellogg RN

## 2023-06-08 NOTE — Progress Notes (Signed)
 Pt awake and speaking to this RN. She denies pain. She is alert and oriented x 1. Kellogg RN

## 2023-06-08 NOTE — Discharge Summary (Signed)
 Lindsey Mcguire, is a 89 y.o. female  DOB Jul 24, 1934  MRN [id number].  Admission date:  06/04/2023  Admitting Physician  Posey Maier, DO  Discharge Date:  06/08/2023   Primary MD  Elliot Charm, MD  Recommendations for primary care physician for things to follow:   1) please encourage adequate oral intake--- avoid dehydration 2) please take medications as prescribed including amoxicillin  3) continue supportive care and help with activities of daily living for advanced dementia  Admission Diagnosis  Altered mental status [R41.82] Acute cystitis without hematuria [N30.00] AKI (acute kidney injury) (HCC) [N17.9] Altered mental status, unspecified altered mental status type [R41.82]   Discharge Diagnosis  Altered mental status [R41.82] Acute cystitis without hematuria [N30.00] AKI (acute kidney injury) (HCC) [N17.9] Altered mental status, unspecified altered mental status type [R41.82]    Principal Problem:   Altered mental status Active Problems:   (HFpEF) heart failure with preserved ejection fraction (HCC)   Essential hypertension   Dementia (HCC)   Acquired hypothyroidism   Mixed hyperlipidemia   Acute cystitis   Acute kidney injury superimposed on chronic kidney disease (HCC)   Dehydration   Thrombocytopenia (HCC)      Past Medical History:  Diagnosis Date   Anemia 10/28/2003   Dementia (HCC)    Diabetes mellitus    Hypertension     Past Surgical History:  Procedure Laterality Date   CATARACT EXTRACTION  12/06/2003   left eye   TUBAL LIGATION  1977   YAG LASER APPLICATION Right 11/09/2013   Procedure: YAG LASER APPLICATION;  Surgeon: Oneil T. Roz, MD;  Location: [facility];  Service: Ophthalmology;  Laterality: Right;     HPI  from the history and physical done on the day of admission:    HPI: Lindsey Mcguire is an 89 y.o. female with medical history significant of  hypertension, hyperlipidemia, hypothyroidism, dementia, HFpEF, type 2 diabetes mellitus, anemia, stage IV CKD, osteoporosis who presents to the emergency department via EMS from rehab facility due to altered mental status.  She was unable to provide a history possibly due to altered mental status, history was obtained from ED physician and ED medical record.  Per report, patient was noted with increased somnolence today, she was not responding normally per baseline of functioning whereby she was able to converse ( though waxes and wanes).  Urine smell was reported to have been stronger recently.  Patient is nonambulatory at baseline.   ED Course:  In the emergency department, she was hemodynamically stable.  Workup in the ED showed normal CBC except for thrombocytopenia, BMP was normal except for blood glucose of 101, BUN 42, creatinine 1.59 (baseline creatinine at 1.0-1.2).  Urinalysis was positive for moderate leukocytes, 21-50 WBC and a few bacteria. Patient was treated with IV ceftriaxone , IV NS 500 mL was given. Hospitalist was asked to admit patient for further evaluation and management.   Review of Systems: Review of systems as noted in the HPI. All other systems reviewed and are negative    [facility]  Course:     Brief Narrative:  89 y.o. female with medical history significant of hypertension, hyperlipidemia, hypothyroidism, dementia, HFpEF, type 2 diabetes mellitus, anemia, stage IV CKD, osteoporosis presented with altered mental status.  On presentation, creatinine was 1.59 (baseline of 1-1.2) with UA suggestive of UTI.  She was started on IV fluids and antibiotics.   Assessment & Plan:   1)Lindsey Mcguire UTI--POA -Treated with IV Rocephin  -Okay to discharge on p.o. amoxicillin    2) advanced dementia with significant cognitive and memory deficits/Failure to thrive -Patient has history of dementia and presented with worsening mental status: Most likely secondary to  UTI -Palliative care consult appreciated -Supportive care --As per patient's son [name] patient has multiple relatives on around-the-clock care at home  3)AKI superimposed on CKD stage IIIb -Creatinine is down to 1.1 from 1.4 -Continue to encourage adequate oral intake.    -Avoid dehydration  4)Mild Chronic thrombocytopenia -Questionable cause.  No signs of bleeding.  Monitor intermittently   Chronic diastolic heart failure -Show no signs of volume overload.  -  Outpatient follow-up with cardiology   Hyperlipidemia -Continue statin   Hypothyroidism -Continue levothyroxine    GERD -Continue Protonix    Diabetes mellitus type 2 -A1c 6.8.  Continue CBGs with SSI  Discharge Condition: stable  Follow UP   Follow-up Information     [facility] Follow up.   Why: HHPT will follow up with you to start services.                 Consults obtained -palliative care  Diet and Activity recommendation:  As advised  Discharge Instructions    Discharge Instructions     Call MD for:  persistant nausea and vomiting   Complete by: As directed    Call MD for:  temperature >100.4   Complete by: As directed    Diet general   Complete by: As directed    Discharge instructions   Complete by: As directed    1) please encourage adequate oral intake--- avoid dehydration 2) please take medications as prescribed including amoxicillin  3) continue supportive care and help with activities of daily living for advanced dementia   Increase activity slowly   Complete by: As directed          Discharge Medications     Allergies as of 06/08/2023       Reactions   Amaryl  [glimepiride ] Other (See Comments)   Upset stomach   Avandia [rosiglitazone] Other (See Comments)   Loss of taste and sense of smell        Medication List     TAKE these medications    acetaminophen  325 MG tablet Commonly known as: TYLENOL  Take 2 tablets (650 mg total) by mouth every 6 (six)  hours as needed for mild pain (pain score 1-3) (or Fever >/= 101).   amoxicillin  500 MG tablet Commonly known as: AMOXIL  Take 1 tablet (500 mg total) by mouth 2 (two) times daily for 6 days.   atorvastatin  40 MG tablet Commonly known as: LIPITOR Take 40 mg by mouth daily.   busPIRone 10 MG tablet Commonly known as: BUSPAR Take 10 mg by mouth 3 (three) times daily.   calcium  carbonate 600 MG Tabs tablet Commonly known as: OS-CAL Take 600 mg by mouth daily.   cholecalciferol 25 MCG (1000 UNIT) tablet Commonly known as: VITAMIN D3 Take 1,000 Units by mouth daily.   ferrous sulfate  325 (65 FE) MG tablet Take 1 tablet (325 mg total) by  mouth daily with breakfast.   levothyroxine  88 MCG tablet Commonly known as: SYNTHROID  Take 1 tablet (88 mcg total) by mouth daily.   omeprazole  20 MG capsule Commonly known as: PRILOSEC Take 1 capsule (20 mg total) by mouth every morning.   polyethylene glycol 17 g packet Commonly known as: MIRALAX  / GLYCOLAX  Take 17 g by mouth daily as needed for moderate constipation.   senna 8.6 MG Tabs tablet Commonly known as: SENOKOT Take 1 tablet by mouth 2 (two) times daily.        Major procedures and Radiology Reports - PLEASE review detailed and final reports for all details, in brief -   Recent Results (from the past 240 hours)  Urine Culture     Status: Abnormal   Collection Time: 06/04/23 11:49 PM   Specimen: In/Out Cath Urine  Result Value Ref Range Status   Specimen Description   Final    IN/OUT CATH URINE Performed at [facility], [address], [place], [place] [address]    Special Requests   Final    NONE Performed at [facility], [address], [place], [place] [address]    Culture (A)  Final    >=100,000 COLONIES/mL Lindsey Mcguire Standardized susceptibility testing for this organism is not available. Performed at [facility], [address], [place], [place] [address]    Report Status 06/07/2023 FINAL   Final   Today   Subjective    Lindsey Mcguire today has no new complaints  Called and spoke with patient's son [name]-questions answered - Oral intake is fair         Patient has been seen and examined prior to discharge   Objective   Blood pressure 129/85, pulse 62, temperature (!) 97.5 F (36.4 C), temperature source Axillary, resp. rate 18, height 5' (1.524 m), weight 43.3 kg, SpO2 100%.   Intake/Output Summary (Last 24 hours) at 06/08/2023 1106 Last data filed at 06/08/2023 0841 Gross per 24 hour  Intake 1553.19 ml  Output 650 ml  Net 903.19 ml   Exam Gen:- Awake Alert, no acute distress  HEENT:- [place].AT, No sclera icterus Neck-Supple Neck,No JVD,.  Lungs-  CTAB , good air movement bilaterally CV- S1, S2 normal, regular Abd-  +ve B.Sounds, Abd Soft, No tenderness,    Extremity/Skin:- No  edema,   good pulses Psych-cognitive and memory deficits consistent with advanced dementia  neuro-generalized weakness ,no new focal deficits, no tremors    Data Review   CBC w Diff:  Lab Results  Component Value Date   WBC 5.0 06/06/2023   HGB 11.1 (L) 06/06/2023   HGB 8.5 (L) 03/31/2017   HCT 34.9 (L) 06/06/2023   HCT 26.3 (L) 03/31/2017   PLT 137 (L) 06/06/2023   PLT 127 (L) 03/31/2017   LYMPHOPCT 14 06/06/2023   LYMPHOPCT 21.9 03/31/2017   MONOPCT 5 06/06/2023   MONOPCT 13.2 03/31/2017   EOSPCT 0 06/06/2023   EOSPCT 2.2 03/31/2017   BASOPCT 0 06/06/2023   BASOPCT 0.5 03/31/2017   CMP:  Lab Results  Component Value Date   NA 141 06/08/2023   NA 140 03/31/2017   K 4.2 06/08/2023   K 3.9 03/31/2017   CL 113 (H) 06/08/2023   CL 109 (H) 05/07/2012   CO2 19 (L) 06/08/2023   CO2 23 03/31/2017   BUN 27 (H) 06/08/2023   BUN 42.7 (H) 03/31/2017   CREATININE 1.19 (H) 06/08/2023   CREATININE 2.1 (H) 03/31/2017   PROT 6.4 (L)  06/06/2023   PROT 7.5 03/31/2017   ALBUMIN  3.1 (L) 06/06/2023   ALBUMIN  3.8 03/31/2017   BILITOT 0.4 06/06/2023   BILITOT 0.42 03/31/2017    ALKPHOS 84 06/06/2023   ALKPHOS 74 03/31/2017   AST 26 06/06/2023   AST 17 03/31/2017   ALT 27 06/06/2023   ALT 13 03/31/2017   Total Discharge time is about 33 minutes  Rendall Carwin M.D on 06/08/2023 at 11:06 AM  Go to [email or web address] -  for contact info  [facility] - Office  [phone]

## 2023-06-08 NOTE — Discharge Instructions (Signed)
 1) please encourage adequate oral intake--- avoid dehydration 2) please take medications as prescribed including amoxicillin  3) continue supportive care and help with activities of daily living for advanced dementia

## 2023-06-08 NOTE — TOC Progression Note (Signed)
 Transition of Care [facility]) - Progression Note    Patient Details  Name: Lindsey Mcguire MRN: [id number] Date of Birth: Aug 31, 1934  [facility] of [facility]) CM/SW Contact  Lorraine LILLETTE Fenton, LCSW Phone Number: 06/08/2023, 12:14 PM  Clinical Narrative:    HHPT accepted by pat and family.  Pt resides with son and another relative resides next door.  They have a CNA starting soon as well.  Requested [facility] specifically, as they were referred when she left SNF and have been calling family- stating they can start when she DC's. CSW contacted [facility] at request of son, referral, accepted.  CSW learned pt has DME including walker and wc.  No further TOC needs at this time.     Expected Discharge Plan: Home w Home Health Services Barriers to Discharge: No Barriers Identified  Expected Discharge Plan and Services In-house Referral: Clinical Social Work Discharge Planning Services: CM Consult Post Acute Care Choice: Durable Medical Equipment, Home Health Living arrangements for the past 2 months: Single Family Home Expected Discharge Date: 06/08/23                         HH Arranged: PT HH Agency: [facility] Date HH Agency Contacted: 06/05/23 Time HH Agency Contacted: 1413 Representative spoke with at [facility] Agency: [place]   Social Determinants of Health (SDOH) Interventions SDOH Screenings   Food Insecurity: Patient Unable To Answer (06/05/2023)  Housing: Patient Unable To Answer (06/05/2023)  Transportation Needs: No Transportation Needs (06/05/2023)  Utilities: Patient Unable To Answer (06/05/2023)  Social Connections: Patient Unable To Answer (06/05/2023)  Tobacco Use: Low Risk  (06/05/2023)  Health Literacy: High Risk (10/21/2022)   Received from [facility]    Readmission Risk Interventions    06/08/2023   12:13 PM 06/05/2023    2:09 PM 06/05/2023    9:31 AM  Readmission Risk Prevention Plan  Transportation Screening Complete Complete Complete  Home Care Screening   Complete Complete  Medication Review (RN CM)  Complete Complete

## 2023-06-09 LAB — GLUCOSE, CAPILLARY
Glucose-Capillary: 90 mg/dL (ref 70–99)
Glucose-Capillary: 133 mg/dL — ABNORMAL HIGH (ref 70–99)

## 2023-06-12 DIAGNOSIS — R739 Hyperglycemia, unspecified: Secondary | ICD-10-CM | POA: Diagnosis not present

## 2023-06-12 DIAGNOSIS — I129 Hypertensive chronic kidney disease with stage 1 through stage 4 chronic kidney disease, or unspecified chronic kidney disease: Secondary | ICD-10-CM | POA: Diagnosis not present

## 2023-06-12 DIAGNOSIS — I2510 Atherosclerotic heart disease of native coronary artery without angina pectoris: Secondary | ICD-10-CM | POA: Diagnosis not present

## 2023-06-12 DIAGNOSIS — Z87440 Personal history of urinary (tract) infections: Secondary | ICD-10-CM | POA: Diagnosis not present

## 2023-06-12 DIAGNOSIS — R54 Age-related physical debility: Secondary | ICD-10-CM | POA: Diagnosis not present

## 2023-06-12 DIAGNOSIS — R627 Adult failure to thrive: Secondary | ICD-10-CM | POA: Diagnosis not present

## 2023-12-24 DIAGNOSIS — I5032 Chronic diastolic (congestive) heart failure: Secondary | ICD-10-CM | POA: Diagnosis not present

## 2023-12-24 DIAGNOSIS — E1121 Type 2 diabetes mellitus with diabetic nephropathy: Secondary | ICD-10-CM | POA: Diagnosis not present

## 2023-12-24 DIAGNOSIS — E785 Hyperlipidemia, unspecified: Secondary | ICD-10-CM | POA: Diagnosis not present

## 2023-12-24 DIAGNOSIS — R2681 Unsteadiness on feet: Secondary | ICD-10-CM | POA: Diagnosis not present

## 2023-12-24 DIAGNOSIS — E039 Hypothyroidism, unspecified: Secondary | ICD-10-CM | POA: Diagnosis not present

## 2024-02-12 DIAGNOSIS — Z20828 Contact with and (suspected) exposure to other viral communicable diseases: Secondary | ICD-10-CM | POA: Diagnosis not present

## 2024-02-25 DIAGNOSIS — Z20828 Contact with and (suspected) exposure to other viral communicable diseases: Secondary | ICD-10-CM | POA: Diagnosis not present
